# Patient Record
Sex: Female | Born: 1945
Health system: Southern US, Community
[De-identification: ages and names within clinical notes are randomized; demographics above are authoritative.]

## PROBLEM LIST (undated history)

## (undated) DIAGNOSIS — IMO0001 Reserved for inherently not codable concepts without codable children: Secondary | ICD-10-CM

## (undated) DIAGNOSIS — M069 Rheumatoid arthritis, unspecified: Secondary | ICD-10-CM

## (undated) DIAGNOSIS — K5900 Constipation, unspecified: Secondary | ICD-10-CM

## (undated) DIAGNOSIS — Z464 Encounter for fitting and adjustment of orthodontic device: Secondary | ICD-10-CM

## (undated) DIAGNOSIS — E559 Vitamin D deficiency, unspecified: Secondary | ICD-10-CM

## (undated) DIAGNOSIS — E119 Type 2 diabetes mellitus without complications: Secondary | ICD-10-CM

## (undated) DIAGNOSIS — R7303 Prediabetes: Secondary | ICD-10-CM

## (undated) DIAGNOSIS — N809 Endometriosis, unspecified: Secondary | ICD-10-CM

## (undated) DIAGNOSIS — R0602 Shortness of breath: Secondary | ICD-10-CM

## (undated) DIAGNOSIS — R29898 Other symptoms and signs involving the musculoskeletal system: Secondary | ICD-10-CM

## (undated) DIAGNOSIS — R609 Edema, unspecified: Secondary | ICD-10-CM

## (undated) DIAGNOSIS — K259 Gastric ulcer, unspecified as acute or chronic, without hemorrhage or perforation: Secondary | ICD-10-CM

## (undated) DIAGNOSIS — M1990 Unspecified osteoarthritis, unspecified site: Secondary | ICD-10-CM

## (undated) DIAGNOSIS — K760 Fatty (change of) liver, not elsewhere classified: Secondary | ICD-10-CM

## (undated) DIAGNOSIS — M2550 Pain in unspecified joint: Secondary | ICD-10-CM

## (undated) DIAGNOSIS — T8859XA Other complications of anesthesia, initial encounter: Secondary | ICD-10-CM

## (undated) DIAGNOSIS — K829 Disease of gallbladder, unspecified: Secondary | ICD-10-CM

## (undated) DIAGNOSIS — E538 Deficiency of other specified B group vitamins: Secondary | ICD-10-CM

## (undated) DIAGNOSIS — N200 Calculus of kidney: Secondary | ICD-10-CM

## (undated) DIAGNOSIS — K219 Gastro-esophageal reflux disease without esophagitis: Secondary | ICD-10-CM

## (undated) DIAGNOSIS — E7800 Pure hypercholesterolemia, unspecified: Secondary | ICD-10-CM

## (undated) DIAGNOSIS — M549 Dorsalgia, unspecified: Secondary | ICD-10-CM

## (undated) DIAGNOSIS — M25559 Pain in unspecified hip: Secondary | ICD-10-CM

## (undated) DIAGNOSIS — T4145XA Adverse effect of unspecified anesthetic, initial encounter: Secondary | ICD-10-CM

## (undated) DIAGNOSIS — R011 Cardiac murmur, unspecified: Secondary | ICD-10-CM

## (undated) HISTORY — DX: Pain in unspecified joint: M25.50

## (undated) HISTORY — DX: Prediabetes: R73.03

## (undated) HISTORY — DX: Dorsalgia, unspecified: M54.9

## (undated) HISTORY — DX: Calculus of kidney: N20.0

## (undated) HISTORY — DX: Gastro-esophageal reflux disease without esophagitis: K21.9

## (undated) HISTORY — DX: Rheumatoid arthritis, unspecified: M06.9

## (undated) HISTORY — DX: Deficiency of other specified B group vitamins: E53.8

## (undated) HISTORY — DX: Unspecified osteoarthritis, unspecified site: M19.90

## (undated) HISTORY — PX: ANKLE SURGERY: SHX546

## (undated) HISTORY — DX: Gastric ulcer, unspecified as acute or chronic, without hemorrhage or perforation: K25.9

## (undated) HISTORY — DX: Disease of gallbladder, unspecified: K82.9

## (undated) HISTORY — DX: Constipation, unspecified: K59.00

## (undated) HISTORY — PX: ABDOMINAL HYSTERECTOMY: SHX81

## (undated) HISTORY — DX: Pain in unspecified hip: M25.559

## (undated) HISTORY — DX: Other symptoms and signs involving the musculoskeletal system: R29.898

## (undated) HISTORY — DX: Pure hypercholesterolemia, unspecified: E78.00

## (undated) HISTORY — DX: Shortness of breath: R06.02

## (undated) HISTORY — DX: Endometriosis, unspecified: N80.9

## (undated) HISTORY — DX: Edema, unspecified: R60.9

## (undated) HISTORY — DX: Vitamin D deficiency, unspecified: E55.9

## (undated) HISTORY — DX: Fatty (change of) liver, not elsewhere classified: K76.0

---

## 2011-12-26 ENCOUNTER — Ambulatory Visit (INDEPENDENT_AMBULATORY_CARE_PROVIDER_SITE_OTHER): Payer: Medicare Other | Admitting: Internal Medicine

## 2011-12-26 ENCOUNTER — Encounter: Payer: Self-pay | Admitting: Internal Medicine

## 2011-12-26 VITALS — BP 110/78 | HR 79 | Temp 97.6°F | Resp 16 | Ht 61.5 in | Wt 229.2 lb

## 2011-12-26 DIAGNOSIS — Z0000 Encounter for general adult medical examination without abnormal findings: Secondary | ICD-10-CM

## 2011-12-26 DIAGNOSIS — H6092 Unspecified otitis externa, left ear: Secondary | ICD-10-CM

## 2011-12-26 DIAGNOSIS — Z1211 Encounter for screening for malignant neoplasm of colon: Secondary | ICD-10-CM

## 2011-12-26 DIAGNOSIS — Z862 Personal history of diseases of the blood and blood-forming organs and certain disorders involving the immune mechanism: Secondary | ICD-10-CM

## 2011-12-26 DIAGNOSIS — Z1331 Encounter for screening for depression: Secondary | ICD-10-CM

## 2011-12-26 DIAGNOSIS — L989 Disorder of the skin and subcutaneous tissue, unspecified: Secondary | ICD-10-CM

## 2011-12-26 DIAGNOSIS — M79604 Pain in right leg: Secondary | ICD-10-CM | POA: Insufficient documentation

## 2011-12-26 DIAGNOSIS — R7309 Other abnormal glucose: Secondary | ICD-10-CM

## 2011-12-26 DIAGNOSIS — Z8639 Personal history of other endocrine, nutritional and metabolic disease: Secondary | ICD-10-CM

## 2011-12-26 DIAGNOSIS — R5383 Other fatigue: Secondary | ICD-10-CM

## 2011-12-26 DIAGNOSIS — Z1239 Encounter for other screening for malignant neoplasm of breast: Secondary | ICD-10-CM

## 2011-12-26 DIAGNOSIS — H60399 Other infective otitis externa, unspecified ear: Secondary | ICD-10-CM

## 2011-12-26 DIAGNOSIS — E7800 Pure hypercholesterolemia, unspecified: Secondary | ICD-10-CM

## 2011-12-26 DIAGNOSIS — M79609 Pain in unspecified limb: Secondary | ICD-10-CM

## 2011-12-26 DIAGNOSIS — Z23 Encounter for immunization: Secondary | ICD-10-CM

## 2011-12-26 DIAGNOSIS — R739 Hyperglycemia, unspecified: Secondary | ICD-10-CM

## 2011-12-26 LAB — CBC WITH DIFFERENTIAL/PLATELET
Basophils Relative: 0.2 % (ref 0.0–3.0)
Hemoglobin: 14.4 g/dL (ref 12.0–15.0)
Lymphocytes Relative: 29.6 % (ref 12.0–46.0)
Monocytes Relative: 7 % (ref 3.0–12.0)
Neutro Abs: 5.2 10*3/uL (ref 1.4–7.7)
Neutrophils Relative %: 61.1 % (ref 43.0–77.0)
RBC: 4.95 Mil/uL (ref 3.87–5.11)
WBC: 8.4 10*3/uL (ref 4.5–10.5)

## 2011-12-26 LAB — COMPREHENSIVE METABOLIC PANEL
BUN: 10 mg/dL (ref 6–23)
CO2: 26 mEq/L (ref 19–32)
Calcium: 9.2 mg/dL (ref 8.4–10.5)
Creatinine, Ser: 0.7 mg/dL (ref 0.4–1.2)
GFR: 95.37 mL/min (ref >60.00)
Glucose, Bld: 98 mg/dL (ref 70–99)
Total Bilirubin: 0.6 mg/dL (ref 0.3–1.2)

## 2011-12-26 LAB — TSH: TSH: 1.72 u[IU]/mL (ref 0.35–5.50)

## 2011-12-26 LAB — HEMOGLOBIN A1C: Hgb A1c MFr Bld: 6 % (ref 4.6–6.5)

## 2011-12-26 LAB — LIPID PANEL
Cholesterol: 242 mg/dL — ABNORMAL HIGH (ref 0–200)
HDL: 39.7 mg/dL (ref >39.00)
Total CHOL/HDL Ratio: 6
Triglycerides: 139 mg/dL (ref 0.0–149.0)

## 2011-12-26 MED ORDER — PNEUMOCOCCAL VAC POLYVALENT 25 MCG/0.5ML IJ INJ: 0.5000 mL | INJECTION | Freq: Once | INTRAMUSCULAR | Status: DC

## 2011-12-26 MED ORDER — ZOSTER VACCINE LIVE 19400 UNT/0.65ML ~~LOC~~ SOLR: 0.6500 mL | Freq: Once | SUBCUTANEOUS | Status: DC

## 2011-12-26 MED ORDER — CIPROFLOXACIN-DEXAMETHASONE 0.3-0.1 % OT SUSP: 4.0000 [drp] | Freq: Two times a day (BID) | OTIC | Status: DC

## 2011-12-26 NOTE — Assessment & Plan Note (Signed)
Pt with several week h/o pain right posterior thigh. Exam is normal today. No edema, skin changes, palpable abnormalities. Suspect muscular strain leading to pain symptoms. If symptoms are persistent, would favor getting US of the area for evaluation.

## 2011-12-26 NOTE — Assessment & Plan Note (Signed)
Skin lesion left axilla most consistent with seborrheic keratosis. Offered reassurance today. Discussed that SK may be removed with cryotherapy, but this would be for cosmetic reasons only.

## 2011-12-26 NOTE — Assessment & Plan Note (Signed)
Symptoms and exam are most consistent with otitis external of left ear. Will treat with Ciprodex. Pt will call if symptoms not improving.

## 2011-12-26 NOTE — Progress Notes (Signed)
Subjective:    Patient ID: Morgan Hurst, female    DOB: December 06, 1945, 66 y.o.   MRN: [id number]  HPI 66 year old female presents to establish care. She has not been seen in over 2 years. She has several concerns today. First, she reports significant fatigue over the last several months. She is having difficulty functioning in completing normal activities of daily living. She reports she has not frequently during the day. She reports that at times it feels like "an effort to breathe "however she denies any focal symptoms such as chest pain, shortness of breath, change in appetite or bowel habits. She has been diagnosed with both B12 deficiency and vitamin D deficiency in the past and questions whether this may be contributing. She has not been taking any supplemental medications.  She is also concerned today about a brown skin lesion in her left axilla. She is unsure how long this has been present. It is not painful or itchy. She has some similar lesions over her anterior chest.  She is also concerned about a several week history of pain in her right posterior thigh. She denies any trauma to her leg, overlying skin changes, swelling in her leg. She reports an area of nodularity in her right posterior thigh which is tender to palpation. This does not seem to be changing in size. She has not taken any medication for this.  She also notes a history of borderline diabetes. She has not been checked for this in some time. She was trying to follow a healthier diet and get regular physical activity, and reports this led to improvement in her blood sugars in the past. She has never been on medication to control her blood sugar.  Outpatient Encounter Prescriptions as of 12/26/2011  Medication Sig Dispense Refill  . ciprofloxacin-dexamethasone (CIPRODEX) otic suspension Place 4 drops into the left ear 2 (two) times daily.  7.5 mL  0  . zoster vaccine live, PF, (ZOSTAVAX) 35573 UNT/0.65ML injection Inject 19,400  Units into the skin once.  1 each  0   Facility-Administered Encounter Medications as of 12/26/2011  Medication Dose Route Frequency Provider Last Rate Last Dose  . pneumococcal 23 valent vaccine (PNU-IMMUNE) injection 0.5 mL  0.5 mL Intramuscular Once Jackolyn Confer, MD       BP 110/78  Pulse 79  Temp 97.6 F (36.4 C) (Oral)  Resp 16  Ht 5' 1.5" (1.562 m)  Wt 229 lb 4 oz (103.987 kg)  BMI 42.61 kg/m2  SpO2 96%  Review of Systems  Constitutional: Positive for fatigue. Negative for fever, chills, appetite change and unexpected weight change.  HENT: Negative for ear pain, congestion, sore throat, trouble swallowing, neck pain, voice change and sinus pressure.   Eyes: Negative for visual disturbance.  Respiratory: Negative for cough, shortness of breath, wheezing and stridor.   Cardiovascular: Negative for chest pain, palpitations and leg swelling.  Gastrointestinal: Negative for nausea, vomiting, abdominal pain, diarrhea, constipation, blood in stool, abdominal distention and anal bleeding.  Genitourinary: Negative for dysuria and flank pain.  Musculoskeletal: Positive for myalgias. Negative for arthralgias and gait problem.  Skin: Negative for color change and rash.  Neurological: Negative for dizziness and headaches.  Hematological: Negative for adenopathy. Does not bruise/bleed easily.  Psychiatric/Behavioral: Negative for suicidal ideas, sleep disturbance and dysphoric mood. The patient is not nervous/anxious.        Objective:   Physical Exam  Constitutional: She is oriented to person, place, and time. She appears well-developed and well-nourished. No  distress.  HENT:  Head: Normocephalic and atraumatic.  Right Ear: External ear normal.  Left Ear: External ear normal.  Nose: Nose normal.  Mouth/Throat: Oropharynx is clear and moist. No oropharyngeal exudate.  Eyes: Conjunctivae normal are normal. Pupils are equal, round, and reactive to light. Right eye exhibits no  discharge. Left eye exhibits no discharge. No scleral icterus.  Neck: Normal range of motion. Neck supple. No tracheal deviation present. No thyromegaly present.  Cardiovascular: Normal rate, regular rhythm, normal heart sounds and intact distal pulses.  Exam reveals no gallop and no friction rub.   No murmur heard. Pulmonary/Chest: Effort normal and breath sounds normal. No respiratory distress. She has no wheezes. She has no rales. She exhibits no tenderness.    Musculoskeletal: Normal range of motion. She exhibits no edema and no tenderness.       Right upper leg: She exhibits tenderness. She exhibits no bony tenderness, no swelling, no edema and no deformity.       Legs: Lymphadenopathy:    She has no cervical adenopathy.  Neurological: She is alert and oriented to person, place, and time. No cranial nerve deficit. She exhibits normal muscle tone. Coordination normal.  Skin: Skin is warm and dry. No rash noted. She is not diaphoretic. No erythema. No pallor.  Psychiatric: She has a normal mood and affect. Her behavior is normal. Judgment and thought content normal.          Assessment & Plan:

## 2011-12-26 NOTE — Assessment & Plan Note (Signed)
Symptoms of profound fatigue. No focal symptoms. Will check CBC, B12, TSH, Vit D, CMP with labs. Pt has h/o B12 def which is currently untreated, so highly suspicious this is causing symptoms. We also discussed potential of sleep apnea. If labs are normal, then would favor getting sleep study. Follow up 4 weeks.

## 2011-12-26 NOTE — Assessment & Plan Note (Signed)
Will set up colonoscopy. Pt has never had a colonoscopy and has history of colon cancer in mother and brother.

## 2011-12-26 NOTE — Assessment & Plan Note (Signed)
Pt with h/o "borderline diabetes" in the past. Will check A1c with labs today.

## 2011-12-26 NOTE — Assessment & Plan Note (Signed)
Will set up screening mammogram.

## 2011-12-27 LAB — VITAMIN D 25 HYDROXY (VIT D DEFICIENCY, FRACTURES): Vit D, 25-Hydroxy: 31 ng/mL (ref 30–89)

## 2011-12-27 MED ORDER — ATORVASTATIN CALCIUM 20 MG PO TABS: 20.0000 mg | ORAL_TABLET | Freq: Every day | ORAL | Status: DC

## 2011-12-27 NOTE — Addendum Note (Signed)
Addended by: Kris Hartmann on: 12/27/2011 05:10 PM   Modules accepted: Orders

## 2011-12-29 ENCOUNTER — Ambulatory Visit: Payer: Medicare Other

## 2011-12-29 ENCOUNTER — Telehealth: Payer: Self-pay | Admitting: Internal Medicine

## 2011-12-29 NOTE — Telephone Encounter (Signed)
Please call pt when b12 shot comes in

## 2012-01-04 NOTE — Telephone Encounter (Signed)
Schedule for B12

## 2012-01-17 ENCOUNTER — Ambulatory Visit (INDEPENDENT_AMBULATORY_CARE_PROVIDER_SITE_OTHER): Payer: Medicare Other | Admitting: Internal Medicine

## 2012-01-17 DIAGNOSIS — R5383 Other fatigue: Secondary | ICD-10-CM

## 2012-01-17 DIAGNOSIS — R5381 Other malaise: Secondary | ICD-10-CM

## 2012-01-17 MED ORDER — CYANOCOBALAMIN 1000 MCG/ML IJ SOLN
1000.0000 ug | Freq: Once | INTRAMUSCULAR | Status: AC
  Administered 2012-01-17: 1000 ug via INTRAMUSCULAR

## 2012-01-17 NOTE — Progress Notes (Signed)
  Subjective:    Patient ID: Morgan Hurst, female    DOB: 1945-07-05, 66 y.o.   MRN: [id number]  HPI Nurse only    Review of Systems     Objective:   Physical Exam        Assessment & Plan:

## 2012-01-24 ENCOUNTER — Ambulatory Visit: Payer: Medicare Other | Admitting: Internal Medicine

## 2012-01-25 ENCOUNTER — Ambulatory Visit (INDEPENDENT_AMBULATORY_CARE_PROVIDER_SITE_OTHER): Payer: Medicare Other | Admitting: Internal Medicine

## 2012-01-25 ENCOUNTER — Encounter: Payer: Self-pay | Admitting: Internal Medicine

## 2012-01-25 VITALS — BP 120/80 | HR 86 | Temp 98.3°F | Ht 61.5 in | Wt 235.5 lb

## 2012-01-25 DIAGNOSIS — E538 Deficiency of other specified B group vitamins: Secondary | ICD-10-CM

## 2012-01-25 DIAGNOSIS — S20219A Contusion of unspecified front wall of thorax, initial encounter: Secondary | ICD-10-CM | POA: Insufficient documentation

## 2012-01-25 DIAGNOSIS — E785 Hyperlipidemia, unspecified: Secondary | ICD-10-CM

## 2012-01-25 MED ORDER — TRAMADOL HCL 50 MG PO TABS: 50.0000 mg | ORAL_TABLET | Freq: Three times a day (TID) | ORAL | Status: DC | PRN

## 2012-01-25 MED ORDER — CYANOCOBALAMIN 1000 MCG/ML IJ SOLN
1000.0000 ug | Freq: Once | INTRAMUSCULAR | Status: AC
  Administered 2012-01-25: 1000 ug via INTRAMUSCULAR
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
## 2012-01-25 NOTE — Assessment & Plan Note (Signed)
LDL elevated 181. Pt is concerned about risk of diabetes associated with statin meds. Offerred reassurance today. Pt would prefer to stop lipitor and try using diet and exercise to improve lipids. Recommended Mediterranean style diet, low in sat fat and high in fiber, and setting goal of 12mn vigorous exercise 3 times per week. Follow up with repeat lipids and LFTs in 3 months.

## 2012-01-25 NOTE — Addendum Note (Signed)
Addended by: Harmon Dun on: 01/25/2012 02:18 PM   Modules accepted: Orders
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
## 2012-01-26 LAB — HM COLONOSCOPY

## 2012-01-27 ENCOUNTER — Telehealth: Payer: Self-pay | Admitting: Internal Medicine

## 2012-01-27 ENCOUNTER — Other Ambulatory Visit: Payer: Self-pay | Admitting: Internal Medicine

## 2012-01-27 DIAGNOSIS — S20219A Contusion of unspecified front wall of thorax, initial encounter: Secondary | ICD-10-CM

## 2012-01-27 NOTE — Telephone Encounter (Signed)
Called patient at home number and left message with spouse to have her return call.
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
## 2012-01-30 NOTE — Telephone Encounter (Signed)
Patient advised as instructed via telephone.

## 2012-02-01 ENCOUNTER — Encounter: Payer: Self-pay | Admitting: Internal Medicine

## 2012-02-01 ENCOUNTER — Ambulatory Visit (INDEPENDENT_AMBULATORY_CARE_PROVIDER_SITE_OTHER): Payer: Medicare Other | Admitting: *Deleted

## 2012-02-01 DIAGNOSIS — E538 Deficiency of other specified B group vitamins: Secondary | ICD-10-CM

## 2012-02-01 MED ORDER — CYANOCOBALAMIN 1000 MCG/ML IJ SOLN
1000.0000 ug | Freq: Once | INTRAMUSCULAR | Status: AC
  Administered 2012-02-01: 1000 ug via INTRAMUSCULAR

## 2012-02-08 ENCOUNTER — Encounter: Payer: Self-pay | Admitting: Internal Medicine

## 2012-02-27 ENCOUNTER — Ambulatory Visit: Payer: Self-pay | Admitting: Internal Medicine

## 2012-04-25 ENCOUNTER — Other Ambulatory Visit: Payer: Medicare Other

## 2012-05-01 ENCOUNTER — Ambulatory Visit: Payer: Medicare Other | Admitting: Internal Medicine

## 2012-05-02 ENCOUNTER — Ambulatory Visit: Payer: Self-pay | Admitting: Gastroenterology

## 2012-05-24 ENCOUNTER — Encounter: Payer: Self-pay | Admitting: Internal Medicine

## 2012-08-16 ENCOUNTER — Encounter: Payer: Self-pay | Admitting: Internal Medicine

## 2012-12-25 ENCOUNTER — Encounter (INDEPENDENT_AMBULATORY_CARE_PROVIDER_SITE_OTHER): Payer: Self-pay

## 2012-12-25 ENCOUNTER — Encounter: Payer: Self-pay | Admitting: Internal Medicine

## 2012-12-25 ENCOUNTER — Ambulatory Visit (INDEPENDENT_AMBULATORY_CARE_PROVIDER_SITE_OTHER): Payer: Medicare Other | Admitting: Internal Medicine

## 2012-12-25 VITALS — BP 140/80 | HR 82 | Temp 97.8°F | Ht 61.0 in | Wt 229.0 lb

## 2012-12-25 DIAGNOSIS — Z1239 Encounter for other screening for malignant neoplasm of breast: Secondary | ICD-10-CM

## 2012-12-25 DIAGNOSIS — D510 Vitamin B12 deficiency anemia due to intrinsic factor deficiency: Secondary | ICD-10-CM

## 2012-12-25 DIAGNOSIS — H60399 Other infective otitis externa, unspecified ear: Secondary | ICD-10-CM

## 2012-12-25 DIAGNOSIS — Z23 Encounter for immunization: Secondary | ICD-10-CM

## 2012-12-25 DIAGNOSIS — E785 Hyperlipidemia, unspecified: Secondary | ICD-10-CM

## 2012-12-25 DIAGNOSIS — M2550 Pain in unspecified joint: Secondary | ICD-10-CM

## 2012-12-25 DIAGNOSIS — H6093 Unspecified otitis externa, bilateral: Secondary | ICD-10-CM

## 2012-12-25 DIAGNOSIS — R413 Other amnesia: Secondary | ICD-10-CM

## 2012-12-25 DIAGNOSIS — Z0000 Encounter for general adult medical examination without abnormal findings: Secondary | ICD-10-CM

## 2012-12-25 MED ORDER — CELECOXIB 200 MG PO CAPS: 200.0000 mg | ORAL_CAPSULE | Freq: Two times a day (BID) | ORAL | Status: DC

## 2012-12-25 MED ORDER — CIPROFLOXACIN-DEXAMETHASONE 0.3-0.1 % OT SUSP: 4.0000 [drp] | Freq: Two times a day (BID) | OTIC | Status: DC

## 2012-12-25 MED ORDER — TRETINOIN 0.025 % EX CREA: TOPICAL_CREAM | Freq: Every day | CUTANEOUS | Status: DC

## 2012-12-25 NOTE — Assessment & Plan Note (Addendum)
Diffuse arthralgia noted by pt. Symptoms most consistent with osteoarthritis. Will start Celebrex to see if any improvement in symptoms. Follow up 4 weeks and prn.

## 2012-12-25 NOTE — Assessment & Plan Note (Signed)
Previous h/o pernicious anemia, now with fatigue. Will check CBC and B12 level with labs today.

## 2012-12-25 NOTE — Assessment & Plan Note (Signed)
Breast exam normal today. Mammogram ordered.

## 2012-12-25 NOTE — Progress Notes (Signed)
Subjective:    Patient ID: Morgan Hurst, female    DOB: [birth year], 67 y.o.   MRN: [id number]  HPI The patient is here for annual Medicare wellness examination and management of other chronic and acute problems.   The risk factors are reflected in the social history.  The roster of all physicians providing medical care to patient - is listed in the Snapshot section of the chart.  Activities of daily living:  The patient is 100% independent in all ADLs: dressing, toileting, feeding as well as independent mobility. Lives with husband.  Home safety : The patient has smoke detectors in the home. They wear seatbelts.  There are no firearms at home. There is no violence in the home.   There is no risks for hepatitis, STDs or HIV. There is no history of blood transfusion. They have no travel history to infectious disease endemic areas of the world.  The patient has seen their dentist in the last six month.  Dentist - unsure name They have seen their eye doctor in the last year. Opthalmologist - [place]  They have deferred audiologic testing in the last year.   They do not  have excessive sun exposure. Discussed the need for sun protection: hats, long sleeves and use of sunscreen if there is significant sun exposure. Dermatologist - none  Diet: the importance of a healthy diet is discussed. They do have a relatively healthy diet.  The benefits of regular aerobic exercise were discussed. She walks on treadmill at home on occasion, however limited by arthritis. Takes Aleve to help with this.  Depression screen: there are no signs or vegative symptoms of depression- irritability, change in appetite, anhedonia, sadness/tearfullness.  Cognitive assessment: the patient manages all their financial and personal affairs and is actively engaged. They could relate day,date,year and events. Notes some slowness of memory recall over last few months.  The following portions of the patient's history  were reviewed and updated as appropriate: allergies, current medications, past family history, past medical history,  past surgical history, past social history  and problem list.  Visual acuity was not assessed per patient preference since she has regular follow up with her ophthalmologist. Hearing and body mass index were assessed and reviewed.   During the course of the visit the patient was educated and counseled about appropriate screening and preventive services including : fall prevention , diabetes screening, nutrition counseling, colorectal cancer screening, and recommended immunizations.    She is concerned today about several months of progressively worsening joint pain. This is most prominent in knees and low back. Symptoms are made worse by physical activity. They are improved with rest and use of Naprosyn. However, she has been taking up to 6 Naprosyn daily. No joint swelling, weakness noted.  She is also concerned about bilateral ear pain over last few weeks. She denies fever, chills, congestion. Pain located in ear canal, occasionally associated with itching. In past, used Ciprodex with improvement.  Outpatient Encounter Prescriptions as of 12/25/2012  Medication Sig  . Apoaequorin (PREVAGEN PO) Take by mouth.  Marland Kitchen aspirin 81 MG tablet Take 81 mg by mouth daily.  . Cholecalciferol (VITAMIN D-3 PO) Take by mouth.  . traMADol (ULTRAM) 50 MG tablet Take 1 tablet (50 mg total) by mouth every 8 (eight) hours as needed for pain.  . celecoxib (CELEBREX) 200 MG capsule Take 1 capsule (200 mg total) by mouth 2 (two) times daily.   BP 140/80  Pulse 82  Temp(Src)  97.8 F (36.6 C) (Oral)  Ht 5' 1"  (1.549 m)  Wt 229 lb (103.874 kg)  BMI 43.29 kg/m2  SpO2 94%   Review of Systems  Constitutional: Negative for fever, chills, appetite change, fatigue and unexpected weight change.  HENT: Positive for ear pain. Negative for congestion, sinus pressure, sore throat, trouble swallowing and voice  change.   Eyes: Negative for visual disturbance.  Respiratory: Negative for cough, shortness of breath, wheezing and stridor.   Cardiovascular: Negative for chest pain, palpitations and leg swelling.  Gastrointestinal: Negative for nausea, vomiting, abdominal pain, diarrhea, constipation, blood in stool, abdominal distention and anal bleeding.  Genitourinary: Negative for dysuria and flank pain.  Musculoskeletal: Positive for arthralgias and back pain. Negative for gait problem, myalgias and neck pain.  Skin: Negative for color change and rash.  Neurological: Negative for dizziness and headaches.  Hematological: Negative for adenopathy. Does not bruise/bleed easily.  Psychiatric/Behavioral: Positive for confusion and decreased concentration. Negative for suicidal ideas, sleep disturbance and dysphoric mood. The patient is not nervous/anxious.        Objective:   Physical Exam  Constitutional: She is oriented to person, place, and time. She appears well-developed and well-nourished. No distress.  HENT:  Head: Normocephalic and atraumatic.  Right Ear: External ear normal.  Left Ear: External ear normal.  Nose: Nose normal.  Mouth/Throat: Oropharynx is clear and moist. No oropharyngeal exudate.  Bilateral ear canals with erythema, no exudate.  Eyes: Conjunctivae are normal. Pupils are equal, round, and reactive to light. Right eye exhibits no discharge. Left eye exhibits no discharge. No scleral icterus.  Neck: Normal range of motion. Neck supple. No tracheal deviation present. No thyromegaly present.  Cardiovascular: Normal rate, regular rhythm, normal heart sounds and intact distal pulses.  Exam reveals no gallop and no friction rub.   No murmur heard. Pulmonary/Chest: Effort normal and breath sounds normal. No accessory muscle usage. Not tachypneic. No respiratory distress. She has no decreased breath sounds. She has no wheezes. She has no rales. She exhibits no tenderness. Right breast  exhibits no inverted nipple, no mass, no nipple discharge, no skin change and no tenderness. Left breast exhibits no inverted nipple, no mass, no nipple discharge, no skin change and no tenderness. Breasts are symmetrical.  Abdominal: Soft. Bowel sounds are normal. She exhibits no distension and no mass. There is no tenderness. There is no rebound and no guarding.  Musculoskeletal: She exhibits no edema and no tenderness.       Lumbar back: She exhibits decreased range of motion and pain. She exhibits no tenderness.  Lymphadenopathy:    She has no cervical adenopathy.  Neurological: She is alert and oriented to person, place, and time. No cranial nerve deficit. She exhibits normal muscle tone. Coordination normal.  Skin: Skin is warm and dry. No rash noted. She is not diaphoretic. No erythema. No pallor.  Psychiatric: She has a normal mood and affect. Her behavior is normal. Judgment and thought content normal.          Assessment & Plan:

## 2012-12-25 NOTE — Progress Notes (Signed)
Pre-visit discussion using our clinic review tool. No additional management support is needed unless otherwise documented below in the visit note.  

## 2012-12-25 NOTE — Assessment & Plan Note (Signed)
Recent symptoms of short term memory loss and problems with word finding. Suspect B12 deficiency contributing. Will check level today. If labs are normal, we discussed cognitive testing for further evaluation.

## 2012-12-25 NOTE — Assessment & Plan Note (Addendum)
General medical exam including breast exam normal today. PAP and pelvic deferred as pt s/p hysterectomy. Mammogram ordered. Flu vaccine given today. Encouraged healthy diet and regular physical exercise. Appropriate screening performed.

## 2012-12-25 NOTE — Assessment & Plan Note (Signed)
Persistent symptoms of pain bilateral ear canals with erythema noted on exam. Will treat with Ciprodex. If no improvement, discussed referral to ENT for further evaluation.

## 2012-12-26 LAB — COMPREHENSIVE METABOLIC PANEL
AST: 38 U/L — ABNORMAL HIGH (ref 0–37)
Alkaline Phosphatase: 80 U/L (ref 39–117)
BUN: 18 mg/dL (ref 6–23)
Calcium: 9.4 mg/dL (ref 8.4–10.5)
Creatinine, Ser: 0.7 mg/dL (ref 0.4–1.2)
Sodium: 144 mEq/L (ref 135–145)
Total Bilirubin: 0.7 mg/dL (ref 0.3–1.2)

## 2012-12-26 LAB — CBC WITH DIFFERENTIAL/PLATELET
Basophils Absolute: 0 10*3/uL (ref 0.0–0.1)
Basophils Relative: 0.4 % (ref 0.0–3.0)
Eosinophils Absolute: 0.3 10*3/uL (ref 0.0–0.7)
Eosinophils Relative: 2.7 % (ref 0.0–5.0)
HCT: 42.8 % (ref 36.0–46.0)
Hemoglobin: 14.1 g/dL (ref 12.0–15.0)
Lymphocytes Relative: 29.6 % (ref 12.0–46.0)
Lymphs Abs: 2.9 10*3/uL (ref 0.7–4.0)
MCHC: 33 g/dL (ref 30.0–36.0)
MCV: 87.7 fl (ref 78.0–100.0)
Monocytes Absolute: 0.7 10*3/uL (ref 0.1–1.0)
Neutro Abs: 5.9 10*3/uL (ref 1.4–7.7)
RBC: 4.88 Mil/uL (ref 3.87–5.11)
RDW: 14.2 % (ref 11.5–14.6)
WBC: 9.8 10*3/uL (ref 4.5–10.5)

## 2012-12-26 LAB — LIPID PANEL
Cholesterol: 229 mg/dL — ABNORMAL HIGH (ref 0–200)
Total CHOL/HDL Ratio: 5
Triglycerides: 83 mg/dL (ref 0.0–149.0)
VLDL: 16.6 mg/dL (ref 0.0–40.0)

## 2012-12-26 LAB — VITAMIN B12: Vitamin B-12: 322 pg/mL (ref 211–911)

## 2012-12-26 LAB — LDL CHOLESTEROL, DIRECT: Direct LDL: 177.7 mg/dL

## 2013-01-25 ENCOUNTER — Other Ambulatory Visit: Payer: Medicare Other

## 2013-07-25 ENCOUNTER — Ambulatory Visit (INDEPENDENT_AMBULATORY_CARE_PROVIDER_SITE_OTHER): Payer: Medicare (Managed Care) | Admitting: Adult Health

## 2013-07-25 ENCOUNTER — Encounter: Payer: Self-pay | Admitting: Adult Health

## 2013-07-25 ENCOUNTER — Telehealth: Payer: Self-pay | Admitting: Internal Medicine

## 2013-07-25 VITALS — BP 135/82 | HR 73 | Temp 97.5°F | Resp 16 | Wt 228.5 lb

## 2013-07-25 DIAGNOSIS — M5430 Sciatica, unspecified side: Secondary | ICD-10-CM | POA: Diagnosis not present

## 2013-07-25 DIAGNOSIS — M5441 Lumbago with sciatica, right side: Secondary | ICD-10-CM | POA: Insufficient documentation

## 2013-07-25 MED ORDER — METHOCARBAMOL 500 MG PO TABS: 500.0000 mg | ORAL_TABLET | Freq: Three times a day (TID) | ORAL | Status: DC

## 2013-07-25 MED ORDER — TRAMADOL HCL 50 MG PO TABS: 50.0000 mg | ORAL_TABLET | Freq: Three times a day (TID) | ORAL | Status: DC | PRN

## 2013-07-25 MED ORDER — PREDNISONE 10 MG PO TABS: ORAL_TABLET | ORAL | Status: DC

## 2013-07-25 NOTE — Telephone Encounter (Signed)
Please see Dr Derry Skill note about appt

## 2013-07-25 NOTE — Progress Notes (Signed)
Pre visit review using our clinic review tool, if applicable. No additional management support is needed unless otherwise documented below in the visit note. 

## 2013-07-25 NOTE — Telephone Encounter (Signed)
The patient pulled a muscle in her back trying to get her husband out of the floor from a fall. She is wanting to see Dr. Gilford Rile.

## 2013-07-25 NOTE — Patient Instructions (Signed)
   Take Tramadol for pain. You can take one table every 8 hours as needed.  Robaxin is a muscle relaxer. You can take this 3 times a day for muscle spasms or tightness.  Start prednisone taper with food as follows:  Day #1 - take 6 tablets Day #2 - take 5 tablets Day #3 - take 4 tablets Day #4 - take 3 tablets Day #5 - take 2 tablets Day #6 - take 1 tablet  Do not take the celebrex while taking prednisone.  Apply ice alternating with heat to the affected areas for 20 min at a time. Do this approximately 3-4 times daily.  Use a firm pillow between your knees when you lie on your side or under your knees when you lie on your back.  Return to clinic if your symptoms are not improving within 2 weeks or sooner if your symptoms worsen.

## 2013-07-25 NOTE — Telephone Encounter (Signed)
The schedule is packed today and tomorrow. I would recommend that we schedule her with Raquel today.

## 2013-07-25 NOTE — Progress Notes (Signed)
Patient ID: Morgan Hurst, female   DOB: 1945/11/11, 68 y.o.   MRN: [id number]   Subjective:    Patient ID: Morgan Hurst, female    DOB: Jan 10, 1946, 68 y.o.   MRN: [id number]  HPI  Pt is a pleasant 68 y/o female who present to clinic with pain of her lower back after helping her husband off the floor following a fall. She takes celebrex and has also taken aleve. She was taking approximately 2 aleve at bedtime. She has also used ben gay to see if it helps. Pain in the right lower back and radiates to her leg. Having trouble getting from a sitting to a standing position.   Past Medical History  Diagnosis Date  . Arthritis   . Endometriosis     Current Outpatient Prescriptions on File Prior to Visit  Medication Sig Dispense Refill  . Apoaequorin (PREVAGEN PO) Take by mouth.      Marland Kitchen aspirin 81 MG tablet Take 81 mg by mouth daily.      . celecoxib (CELEBREX) 200 MG capsule Take 1 capsule (200 mg total) by mouth 2 (two) times daily.  60 capsule  3  . Cholecalciferol (VITAMIN D-3 PO) Take by mouth.      . ciprofloxacin-dexamethasone (CIPRODEX) otic suspension Place 4 drops into both ears 2 (two) times daily.  7.5 mL  0  . Cyanocobalamin 1000 MCG/ML LIQD One injection (1,000 mg) once a week for three weeks then once a month      . traMADol (ULTRAM) 50 MG tablet Take 1 tablet (50 mg total) by mouth every 8 (eight) hours as needed for pain.  60 tablet  3  . tretinoin (RETIN-A) 0.025 % cream Apply topically at bedtime.  45 g  0   No current facility-administered medications on file prior to visit.     Review of Systems  Respiratory: Negative.   Genitourinary: Negative.   Musculoskeletal: Positive for back pain and gait problem.  All other systems reviewed and are negative.      Objective:  BP 135/82  Pulse 73  Temp(Src) 97.5 F (36.4 C) (Oral)  Resp 16  Wt 228 lb 8 oz (103.647 kg)  SpO2 96%   Physical Exam  Constitutional: She is oriented to person, place, and time. No  distress.  Cardiovascular: Normal rate and regular rhythm.   Pulmonary/Chest: Effort normal. No respiratory distress.  Musculoskeletal: Normal range of motion. She exhibits tenderness.  Movements are guarded. She has trouble getting from sitting to standing and vice versa.  Neurological: She is alert and oriented to person, place, and time.  Skin: Skin is warm and dry.  Psychiatric: She has a normal mood and affect. Her behavior is normal. Judgment and thought content normal.      Assessment & Plan:   1. Right-sided low back pain with right-sided sciatica Prednisone taper - 60 mg taper by 10 mg daily until done. Hold celebrex while on taper. Also advised not to take aleve or ibuprofen with celebrex when she restarts it. Ice alternating with heat for 20 min 3-4 times a day. Tramadol for pain. Robaxin for muscle spasms. RTC if not improvement within 2 weeks or sooner if necessary.

## 2013-07-25 NOTE — Telephone Encounter (Signed)
Please advise 

## 2013-08-08 ENCOUNTER — Ambulatory Visit (INDEPENDENT_AMBULATORY_CARE_PROVIDER_SITE_OTHER): Payer: Medicare (Managed Care) | Admitting: Adult Health

## 2013-08-08 ENCOUNTER — Ambulatory Visit (INDEPENDENT_AMBULATORY_CARE_PROVIDER_SITE_OTHER)
Admission: RE | Admit: 2013-08-08 | Discharge: 2013-08-08 | Disposition: A | Discharge status: Home / Self Care | Payer: Medicare (Managed Care) | Source: Ambulatory Visit | Attending: Adult Health | Admitting: Adult Health

## 2013-08-08 ENCOUNTER — Encounter: Payer: Self-pay | Admitting: Adult Health

## 2013-08-08 VITALS — BP 122/79 | HR 69 | Temp 98.0°F | Resp 14 | Wt 228.2 lb

## 2013-08-08 DIAGNOSIS — M545 Low back pain, unspecified: Secondary | ICD-10-CM

## 2013-08-08 MED ORDER — PREDNISONE 10 MG PO TABS: ORAL_TABLET | ORAL | Status: DC

## 2013-08-08 NOTE — Progress Notes (Signed)
Patient ID: Morgan Hurst, female   DOB: 1945-08-19, 68 y.o.   MRN: [id number]   Subjective:    Patient ID: Morgan Hurst, female    DOB: 1945/03/12, 68 y.o.   MRN: [id number]  HPI  Pt with ongoing low back pain with sciatica following lifting her husband off the floor when he fell. She reports that she felt the prednisone was helping. She has also been taking Tramadol and the Robaxin. She now has radiating pain down the left leg as well. She is applying heat and ice occasionally. She has not done any lifting. No numbness or tingling.  Past Medical History  Diagnosis Date  . Arthritis   . Endometriosis     Current Outpatient Prescriptions on File Prior to Visit  Medication Sig Dispense Refill  . Apoaequorin (PREVAGEN PO) Take by mouth.      Marland Kitchen aspirin 81 MG tablet Take 81 mg by mouth daily.      . celecoxib (CELEBREX) 200 MG capsule Take 1 capsule (200 mg total) by mouth 2 (two) times daily.  60 capsule  3  . Cholecalciferol (VITAMIN D-3 PO) Take by mouth.      . Cyanocobalamin 1000 MCG/ML LIQD One injection (1,000 mg) once a week for three weeks then once a month      . methocarbamol (ROBAXIN) 500 MG tablet Take 1 tablet (500 mg total) by mouth 3 (three) times daily.  30 tablet  0  . traMADol (ULTRAM) 50 MG tablet Take 1 tablet (50 mg total) by mouth every 8 (eight) hours as needed.  60 tablet  0  . tretinoin (RETIN-A) 0.025 % cream Apply topically at bedtime.  45 g  0   No current facility-administered medications on file prior to visit.     Review of Systems Positive for low back pain with radiating pain down bilateral legs, difficulty moving from sitting to standing position Negative for numbness, tingling. Denies fever, chills, rash All other systems negative    Objective:  BP 122/79  Pulse 69  Temp(Src) 98 F (36.7 C) (Oral)  Resp 14  Wt 228 lb 4 oz (103.534 kg)  SpO2 98%   Physical Exam  Constitutional: She is oriented to person, place, and time. No distress.    Cardiovascular: Normal rate and regular rhythm.   Pulmonary/Chest: Effort normal. No respiratory distress.  Musculoskeletal: She exhibits tenderness. She exhibits no edema.  Pain in low back. Difficulty with changing positions.  Neurological: She is alert and oriented to person, place, and time.  Skin: Skin is warm and dry.  Psychiatric: She has a normal mood and affect. Her behavior is normal. Judgment and thought content normal.      Assessment & Plan:   1. Bilateral low back pain, with sciatica presence unspecified I will send her for lumbar films. Start a longer prednisone taper. Perhaps the first one was too short. She did notice improvement with the prednisone. Continue robaxin and tramadol. May need referral to ortho. Await results of xray. - DG Lumbar Spine Complete; Future

## 2013-08-08 NOTE — Progress Notes (Signed)
Pre visit review using our clinic review tool, if applicable. No additional management support is needed unless otherwise documented below in the visit note. 

## 2013-08-13 ENCOUNTER — Other Ambulatory Visit: Payer: Self-pay | Admitting: Adult Health

## 2013-08-13 DIAGNOSIS — M545 Low back pain, unspecified: Secondary | ICD-10-CM

## 2013-08-13 NOTE — Progress Notes (Signed)
Patient notified

## 2013-10-29 ENCOUNTER — Ambulatory Visit: Payer: Self-pay | Admitting: Pain Medicine

## 2013-11-21 ENCOUNTER — Ambulatory Visit: Payer: Self-pay | Admitting: Pain Medicine

## 2014-01-21 ENCOUNTER — Encounter: Payer: Medicare (Managed Care) | Admitting: Internal Medicine

## 2014-02-14 ENCOUNTER — Encounter: Payer: Self-pay | Admitting: Internal Medicine

## 2014-02-14 ENCOUNTER — Ambulatory Visit (INDEPENDENT_AMBULATORY_CARE_PROVIDER_SITE_OTHER): Payer: Medicare Other | Admitting: Internal Medicine

## 2014-02-14 VITALS — BP 129/78 | HR 72 | Temp 97.5°F | Ht 61.0 in | Wt 227.2 lb

## 2014-02-14 DIAGNOSIS — M2550 Pain in unspecified joint: Secondary | ICD-10-CM

## 2014-02-14 DIAGNOSIS — IMO0001 Reserved for inherently not codable concepts without codable children: Secondary | ICD-10-CM

## 2014-02-14 DIAGNOSIS — D510 Vitamin B12 deficiency anemia due to intrinsic factor deficiency: Secondary | ICD-10-CM

## 2014-02-14 DIAGNOSIS — H6693 Otitis media, unspecified, bilateral: Secondary | ICD-10-CM | POA: Insufficient documentation

## 2014-02-14 DIAGNOSIS — Z0000 Encounter for general adult medical examination without abnormal findings: Secondary | ICD-10-CM | POA: Insufficient documentation

## 2014-02-14 DIAGNOSIS — M609 Myositis, unspecified: Secondary | ICD-10-CM

## 2014-02-14 DIAGNOSIS — M791 Myalgia: Secondary | ICD-10-CM

## 2014-02-14 DIAGNOSIS — E669 Obesity, unspecified: Secondary | ICD-10-CM | POA: Insufficient documentation

## 2014-02-14 LAB — COMPREHENSIVE METABOLIC PANEL
ALT: 17 U/L (ref 0–35)
AST: 19 U/L (ref 0–37)
Albumin: 4.2 g/dL (ref 3.5–5.2)
Alkaline Phosphatase: 80 U/L (ref 39–117)
BUN: 12 mg/dL (ref 6–23)
CHLORIDE: 107 meq/L (ref 96–112)
CO2: 25 meq/L (ref 19–32)
CREATININE: 0.6 mg/dL (ref 0.40–1.20)
Calcium: 9.5 mg/dL (ref 8.4–10.5)
GFR: 105.76 mL/min (ref >60.00)
GLUCOSE: 96 mg/dL (ref 70–99)
Potassium: 4.2 mEq/L (ref 3.5–5.1)
Sodium: 143 mEq/L (ref 135–145)
TOTAL PROTEIN: 7.1 g/dL (ref 6.0–8.3)
Total Bilirubin: 0.6 mg/dL (ref 0.2–1.2)

## 2014-02-14 LAB — CBC WITH DIFFERENTIAL/PLATELET
Basophils Absolute: 0 10*3/uL (ref 0.0–0.1)
Basophils Relative: 0.6 % (ref 0.0–3.0)
Eosinophils Absolute: 0.3 10*3/uL (ref 0.0–0.7)
Eosinophils Relative: 3.2 % (ref 0.0–5.0)
HCT: 44.1 % (ref 36.0–46.0)
Hemoglobin: 14.9 g/dL (ref 12.0–15.0)
LYMPHS PCT: 27.6 % (ref 12.0–46.0)
Lymphs Abs: 2.3 10*3/uL (ref 0.7–4.0)
MCHC: 33.8 g/dL (ref 30.0–36.0)
MCV: 87 fl (ref 78.0–100.0)
Monocytes Absolute: 0.6 10*3/uL (ref 0.1–1.0)
Monocytes Relative: 7.8 % (ref 3.0–12.0)
Neutro Abs: 5.1 10*3/uL (ref 1.4–7.7)
Neutrophils Relative %: 60.8 % (ref 43.0–77.0)
Platelets: 196 10*3/uL (ref 150.0–400.0)
RBC: 5.07 Mil/uL (ref 3.87–5.11)
RDW: 13.7 % (ref 11.5–15.5)
WBC: 8.3 10*3/uL (ref 4.0–10.5)

## 2014-02-14 LAB — HEMOGLOBIN A1C: Hgb A1c MFr Bld: 6 % (ref 4.6–6.5)

## 2014-02-14 LAB — MICROALBUMIN / CREATININE URINE RATIO
Creatinine,U: 124.3 mg/dL
MICROALB UR: 1.6 mg/dL (ref 0.0–1.9)
Microalb Creat Ratio: 1.3 mg/g (ref 0.0–30.0)

## 2014-02-14 LAB — LIPID PANEL
Cholesterol: 202 mg/dL — ABNORMAL HIGH (ref 0–200)
HDL: 52.2 mg/dL (ref >39.00)
LDL Cholesterol: 124 mg/dL — ABNORMAL HIGH (ref 0–99)
NONHDL: 149.8
Total CHOL/HDL Ratio: 4
Triglycerides: 128 mg/dL (ref 0.0–149.0)
VLDL: 25.6 mg/dL (ref 0.0–40.0)

## 2014-02-14 LAB — TSH: TSH: 2.73 u[IU]/mL (ref 0.35–4.50)

## 2014-02-14 LAB — VITAMIN D 25 HYDROXY (VIT D DEFICIENCY, FRACTURES): VITD: 27.58 ng/mL — ABNORMAL LOW (ref 30.00–100.00)

## 2014-02-14 LAB — VITAMIN B12: Vitamin B-12: 220 pg/mL (ref 211–911)

## 2014-02-14 MED ORDER — AMOXICILLIN-POT CLAVULANATE 875-125 MG PO TABS: 1.0000 | ORAL_TABLET | Freq: Two times a day (BID) | ORAL | Status: DC

## 2014-02-14 MED ORDER — TRAMADOL HCL 50 MG PO TABS: 50.0000 mg | ORAL_TABLET | Freq: Three times a day (TID) | ORAL | Status: DC | PRN

## 2014-02-14 MED ORDER — ANTIPYRINE-BENZOCAINE 5.4-1.4 % OT SOLN: 3.0000 [drp] | OTIC | Status: DC | PRN

## 2014-02-14 MED ORDER — MELOXICAM 15 MG PO TABS
15.0000 mg | ORAL_TABLET | Freq: Every day | ORAL | Status: DC
Start: 2014-02-14 — End: 2014-09-25

## 2014-02-14 NOTE — Assessment & Plan Note (Signed)
Chronic back pain secondary to spinal stenosis. Will start Meloxicam to see if any improvement. Discussed risks of this medication. Will continue Tramadol prn.

## 2014-02-14 NOTE — Assessment & Plan Note (Signed)
Recent increased muscular pain over arms, legs. Will continue prn Tramadol. Will check electrolytes and B12 with labs.

## 2014-02-14 NOTE — Progress Notes (Signed)
Subjective:    Patient ID: Morgan Hurst, female    DOB: Feb 19, 1945, 69 y.o.   MRN: [id number]  HPI  69YO female presents for acute visit.  Developed severe pain in right ear yesterday. No fever, congestion, sore throat. A few days ago had slight runny nose, but this has resolved. No recent swimming, bathing. Has used Ciprodex in the past for external ear infection, used a left-over prescription all year. No change in hearing.  Back pain - Seen in 07/2013 by Raquel Rey. Had MRI with ortho, found to have spinal stenosis. Started on Tramadol with some improvement. This has been controlling pain very well. Stopped Celebrex because she had aching in legs with this. Symptoms improved with stopping Celebrex.  Past medical, surgical, family and social history per today's encounter.  Review of Systems  Constitutional: Negative for fever, chills, appetite change, fatigue and unexpected weight change.  HENT: Positive for ear pain. Negative for congestion, postnasal drip, rhinorrhea, sinus pressure, sore throat and trouble swallowing.   Eyes: Negative for visual disturbance.  Respiratory: Negative for shortness of breath.   Cardiovascular: Negative for chest pain and leg swelling.  Gastrointestinal: Negative for abdominal pain, diarrhea and constipation.  Musculoskeletal: Positive for myalgias, back pain and arthralgias.  Skin: Negative for color change and rash.  Hematological: Negative for adenopathy. Does not bruise/bleed easily.  Psychiatric/Behavioral: Negative for sleep disturbance and dysphoric mood. The patient is not nervous/anxious.        Objective:    BP 129/78 mmHg  Pulse 72  Temp(Src) 97.5 F (36.4 C) (Oral)  Ht 5' 1"  (1.549 m)  Wt 227 lb 4 oz (103.08 kg)  BMI 42.96 kg/m2  SpO2 96% Physical Exam  Constitutional: She is oriented to person, place, and time. She appears well-developed and well-nourished. No distress.  HENT:  Head: Normocephalic and atraumatic.  Right  Ear: External ear normal. Tympanic membrane is erythematous and bulging. A middle ear effusion is present.  Left Ear: External ear normal. Tympanic membrane is erythematous and bulging. A middle ear effusion is present.  Nose: Nose normal.  Mouth/Throat: Oropharynx is clear and moist. No oropharyngeal exudate.  Eyes: Conjunctivae are normal. Pupils are equal, round, and reactive to light. Right eye exhibits no discharge. Left eye exhibits no discharge. No scleral icterus.  Neck: Normal range of motion. Neck supple. No tracheal deviation present. No thyromegaly present.  Cardiovascular: Normal rate, regular rhythm, normal heart sounds and intact distal pulses.  Exam reveals no gallop and no friction rub.   No murmur heard. Pulmonary/Chest: Effort normal and breath sounds normal. No accessory muscle usage. No tachypnea. No respiratory distress. She has no decreased breath sounds. She has no wheezes. She has no rhonchi. She has no rales. She exhibits no tenderness.  Musculoskeletal: Normal range of motion. She exhibits no edema or tenderness.  Lymphadenopathy:    She has no cervical adenopathy.  Neurological: She is alert and oriented to person, place, and time. No cranial nerve deficit. She exhibits normal muscle tone. Coordination normal.  Skin: Skin is warm and dry. No rash noted. She is not diaphoretic. No erythema. No pallor.  Psychiatric: She has a normal mood and affect. Her behavior is normal. Judgment and thought content normal.          Assessment & Plan:   Problem List Items Addressed This Visit      Unprioritized   Arthralgia    Chronic back pain secondary to spinal stenosis. Will start Meloxicam to see  if any improvement. Discussed risks of this medication. Will continue Tramadol prn.       Bilateral otitis media - Primary    Bilateral OM on exam. Will start Augmentin. Auralgan prn pain. Follow up recheck in 3 weeks or sooner if symptoms not improving.      Relevant  Medications   AMOXICILLIN-POT CLAVULANATE 875-125 MG PO TABS   Myalgia and myositis    Recent increased muscular pain over arms, legs. Will continue prn Tramadol. Will check electrolytes and B12 with labs.      Relevant Orders   CBC with Differential/Platelet   Comprehensive metabolic panel   TSH   V91   Severe obesity (BMI >= 40)    Wt Readings from Last 3 Encounters:  02/14/14 227 lb 4 oz (103.08 kg)  08/08/13 228 lb 4 oz (103.534 kg)  07/25/13 228 lb 8 oz (103.647 kg)   Will check TSH and A1c with labs today.      Relevant Orders   TSH   Hemoglobin A1c    Other Visit Diagnoses    Routine general medical examination at a health care facility        Relevant Medications    meloxicam (MOBIC) tablet    Other Relevant Orders    Lipid panel    Microalbumin / creatinine urine ratio    Vit D  25 hydroxy (rtn osteoporosis monitoring)    Hemoglobin A1c        Return in about 3 weeks (around 03/07/2014) for Wellness Visit.

## 2014-02-14 NOTE — Progress Notes (Signed)
Pre visit review using our clinic review tool, if applicable. No additional management support is needed unless otherwise documented below in the visit note. 

## 2014-02-14 NOTE — Assessment & Plan Note (Signed)
Bilateral OM on exam. Will start Augmentin. Auralgan prn pain. Follow up recheck in 3 weeks or sooner if symptoms not improving.

## 2014-02-14 NOTE — Patient Instructions (Signed)
Start Meloxicam 28m daily to help with arthritis pain.  Start Augmentin twice daily. Use Auralgan as needed for ear pain. Take a probiotic yogurt while on the Augmentin.  Follow up in 3 weeks.

## 2014-02-14 NOTE — Assessment & Plan Note (Signed)
Wt Readings from Last 3 Encounters:  02/14/14 227 lb 4 oz (103.08 kg)  08/08/13 228 lb 4 oz (103.534 kg)  07/25/13 228 lb 8 oz (103.647 kg)   Will check TSH and A1c with labs today.

## 2014-02-20 ENCOUNTER — Other Ambulatory Visit: Payer: Self-pay | Admitting: *Deleted

## 2014-02-20 MED ORDER — AMOXICILLIN-POT CLAVULANATE 875-125 MG PO TABS: 1.0000 | ORAL_TABLET | Freq: Two times a day (BID) | ORAL | Status: DC

## 2014-02-20 NOTE — Telephone Encounter (Signed)
Pt called in, states she has lost 17 of her 20 Augmentin tablets that she was prescribed, requesting more called to pharmacy. States she has looked everywhere for them and cannot find the additional tablets. Rx sent to pharmacy by escript

## 2014-03-18 ENCOUNTER — Ambulatory Visit (INDEPENDENT_AMBULATORY_CARE_PROVIDER_SITE_OTHER): Payer: Medicare Other | Admitting: Internal Medicine

## 2014-03-18 ENCOUNTER — Encounter: Payer: Self-pay | Admitting: Internal Medicine

## 2014-03-18 VITALS — BP 130/75 | HR 75 | Temp 97.5°F | Ht 61.75 in | Wt 224.5 lb

## 2014-03-18 DIAGNOSIS — Z23 Encounter for immunization: Secondary | ICD-10-CM | POA: Diagnosis not present

## 2014-03-18 DIAGNOSIS — L989 Disorder of the skin and subcutaneous tissue, unspecified: Secondary | ICD-10-CM

## 2014-03-18 DIAGNOSIS — E538 Deficiency of other specified B group vitamins: Secondary | ICD-10-CM

## 2014-03-18 DIAGNOSIS — Z1239 Encounter for other screening for malignant neoplasm of breast: Secondary | ICD-10-CM

## 2014-03-18 DIAGNOSIS — Z0000 Encounter for general adult medical examination without abnormal findings: Secondary | ICD-10-CM | POA: Diagnosis not present

## 2014-03-18 DIAGNOSIS — Z780 Asymptomatic menopausal state: Secondary | ICD-10-CM | POA: Diagnosis not present

## 2014-03-18 DIAGNOSIS — Z634 Disappearance and death of family member: Secondary | ICD-10-CM | POA: Insufficient documentation

## 2014-03-18 MED ORDER — CYANOCOBALAMIN 1000 MCG/ML IJ SOLN
1000.0000 ug | Freq: Once | INTRAMUSCULAR | Status: AC
Start: 2014-03-18 — End: 2014-03-18
  Administered 2014-03-18: 1000 ug via INTRAMUSCULAR

## 2014-03-18 NOTE — Addendum Note (Signed)
Addended by: Ronette Deter A on: 03/18/2014 10:06 AM   Modules accepted: Miquel Dunn

## 2014-03-18 NOTE — Assessment & Plan Note (Signed)
General medical exam including breast exam normal except as noted. PAP and pelvic deferred as s/p hysterectomy. Flu and Prevnar vaccines given today. Reviewed recent labs. Encouraged healthy diet and exercise. Follow up in 3 months and prn.

## 2014-03-18 NOTE — Progress Notes (Signed)
Pre visit review using our clinic review tool, if applicable. No additional management support is needed unless otherwise documented below in the visit note. 

## 2014-03-18 NOTE — Patient Instructions (Signed)

## 2014-03-18 NOTE — Assessment & Plan Note (Signed)
Wt Readings from Last 3 Encounters:  03/18/14 224 lb 8 oz (101.833 kg)  02/14/14 227 lb 4 oz (103.08 kg)  08/08/13 228 lb 4 oz (103.534 kg)   Body mass index is 41.42 kg/(m^2). The patient is asked to make an attempt to improve diet and exercise patterns to aid in medical management of this problem.

## 2014-03-18 NOTE — Assessment & Plan Note (Signed)
Mammogram ordered

## 2014-03-18 NOTE — Addendum Note (Signed)
Addended by: Vernetta Honey on: 03/18/2014 10:07 AM   Modules accepted: Orders

## 2014-03-18 NOTE — Progress Notes (Addendum)
Subjective:    Patient ID: Morgan Hurst, female    DOB: Aug 26, 1945, 68 y.o.   MRN: [id number]  HPI  The patient is here for annual Medicare wellness examination and management of other chronic and acute problems.   The risk factors are reflected in the social history.  The roster of all physicians providing medical care to patient - is listed in the Snapshot section of the chart.  Activities of daily living:  The patient is 100% independent in all ADLs: dressing, toileting, feeding as well as independent mobility.  Husband died last week with pneumonia. Had been married 82years.  Home safety : The patient has smoke detectors in the home. They wear seatbelts.  There are no firearms at home. There is no violence in the home.   There is no risks for hepatitis, STDs or HIV. There is no history of blood transfusion. They have no travel history to infectious disease endemic areas of the world.  The patient has seen their dentist in the last six month.  Dentist - unsure name They have seen their eye doctor in the last year. Opthalmologist - [facility]  They have deferred audiologic testing in the last year.   They do not  have excessive sun exposure. Discussed the need for sun protection: hats, long sleeves and use of sunscreen if there is significant sun exposure. Dermatologist - none  Diet: the importance of a healthy diet is discussed. They do have a relatively healthy diet.  The benefits of regular aerobic exercise were discussed. She walks on treadmill at home on occasion, however limited by arthritis. Takes Aleve to help with this.  Depression screen: there are no signs or vegative symptoms of depression- irritability, change in appetite, anhedonia, sadness/tearfullness.  Cognitive assessment: the patient manages all their financial and personal affairs and is actively engaged. They could relate day,date,year and events.   The following portions of the patient's history were  reviewed and updated as appropriate: allergies, current medications, past family history, past medical history,  past surgical history, past social history  and problem list.  Visual acuity was not assessed per patient preference since she has regular follow up with her ophthalmologist. Hearing and body mass index were assessed and reviewed.   During the course of the visit the patient was educated and counseled about appropriate screening and preventive services including : fall prevention , diabetes screening, nutrition counseling, colorectal cancer screening, and recommended immunizations.    Wt Readings from Last 3 Encounters:  03/18/14 224 lb 8 oz (101.833 kg)  02/14/14 227 lb 4 oz (103.08 kg)  08/08/13 228 lb 4 oz (103.534 kg)     Past medical, surgical, family and social history per today's encounter.  Review of Systems  Constitutional: Negative for fever, chills, appetite change, fatigue and unexpected weight change.  Eyes: Negative for visual disturbance.  Respiratory: Negative for shortness of breath.   Cardiovascular: Negative for chest pain and leg swelling.  Gastrointestinal: Positive for anal bleeding (occasional with wiping, h/o external hemorrhoid). Negative for nausea, vomiting, abdominal pain, diarrhea and constipation.  Musculoskeletal: Positive for myalgias and arthralgias.  Skin: Negative for color change and rash.  Hematological: Negative for adenopathy. Does not bruise/bleed easily.  Psychiatric/Behavioral: Negative for suicidal ideas, sleep disturbance and dysphoric mood. The patient is not nervous/anxious.        Objective:    BP 130/75 mmHg  Pulse 75  Temp(Src) 97.5 F (36.4 C) (Oral)  Ht 5' 1.75" (1.568 m)  Wt 224 lb 8 oz (101.833 kg)  BMI 41.42 kg/m2  SpO2 96% Physical Exam  Constitutional: She is oriented to person, place, and time. She appears well-developed and well-nourished. No distress.  HENT:  Head: Normocephalic and atraumatic.  Right Ear:  External ear normal.  Left Ear: External ear normal.  Nose: Nose normal.  Mouth/Throat: Oropharynx is clear and moist. No oropharyngeal exudate.  Eyes: Conjunctivae are normal. Pupils are equal, round, and reactive to light. Right eye exhibits no discharge. Left eye exhibits no discharge. No scleral icterus.  Neck: Normal range of motion. Neck supple. No tracheal deviation present. No thyromegaly present.  Cardiovascular: Normal rate, regular rhythm, normal heart sounds and intact distal pulses.  Exam reveals no gallop and no friction rub.   No murmur heard. Pulmonary/Chest: Effort normal and breath sounds normal. No accessory muscle usage. No tachypnea. No respiratory distress. She has no decreased breath sounds. She has no wheezes. She has no rales. She exhibits no tenderness. Right breast exhibits no inverted nipple, no mass, no nipple discharge, no skin change and no tenderness. Left breast exhibits no inverted nipple, no mass, no nipple discharge, no skin change and no tenderness. Breasts are symmetrical.  Abdominal: Soft. Bowel sounds are normal. She exhibits no distension and no mass. There is no tenderness. There is no rebound and no guarding.  Musculoskeletal: Normal range of motion. She exhibits no edema or tenderness.  Lymphadenopathy:    She has no cervical adenopathy.  Neurological: She is alert and oriented to person, place, and time. No cranial nerve deficit. She exhibits normal muscle tone. Coordination normal.  Skin: Skin is warm and dry. No rash noted. She is not diaphoretic. No erythema. No pallor.     Psychiatric: She has a normal mood and affect. Her behavior is normal. Judgment and thought content normal.          Assessment & Plan:   Problem List Items Addressed This Visit      Unprioritized   Bereavement    Husband died one week ago. Offered support today. Encouraged her to use support from family.      Medicare annual wellness visit, subsequent - Primary     General medical exam including breast exam normal except as noted. PAP and pelvic deferred as s/p hysterectomy. Flu and Prevnar vaccines given today. Reviewed recent labs. Encouraged healthy diet and exercise. Follow up in 3 months and prn.      Postmenopausal estrogen deficiency    Bone density testing ordered.      Relevant Orders   DG Bone Density   Screening for breast cancer    Mammogram ordered.      Relevant Orders   MM Digital Screening   Severe obesity (BMI >= 40)    Wt Readings from Last 3 Encounters:  03/18/14 224 lb 8 oz (101.833 kg)  02/14/14 227 lb 4 oz (103.08 kg)  08/08/13 228 lb 4 oz (103.534 kg)   Body mass index is 41.42 kg/(m^2). The patient is asked to make an attempt to improve diet and exercise patterns to aid in medical management of this problem.       Skin lesion    Skin lesions under breasts most consistent with acrochordon. Will set up dermatology evaluation.      Relevant Orders   Ambulatory referral to Dermatology    Other Visit Diagnoses    Need for vaccination with 13-polyvalent pneumococcal conjugate vaccine        Relevant Orders  Pneumococcal conjugate vaccine 13-valent    B12 deficiency        Relevant Medications    cyanocobalamin ((VITAMIN B-12)) injection 1,000 mcg (Start on 03/18/2014 10:15 AM)        Return in about 3 months (around 06/18/2014) for Recheck.

## 2014-03-18 NOTE — Assessment & Plan Note (Signed)
Husband died one week ago. Offered support today. Encouraged her to use support from family.

## 2014-03-18 NOTE — Assessment & Plan Note (Signed)
Bone density testing ordered.

## 2014-03-18 NOTE — Assessment & Plan Note (Signed)
Skin lesions under breasts most consistent with acrochordon. Will set up dermatology evaluation.

## 2014-04-24 ENCOUNTER — Ambulatory Visit (INDEPENDENT_AMBULATORY_CARE_PROVIDER_SITE_OTHER): Payer: Medicare Other

## 2014-04-24 DIAGNOSIS — E538 Deficiency of other specified B group vitamins: Secondary | ICD-10-CM | POA: Diagnosis not present

## 2014-04-24 MED ORDER — CYANOCOBALAMIN 1000 MCG/ML IJ SOLN
1000.0000 ug | Freq: Once | INTRAMUSCULAR | Status: AC
  Administered 2014-04-24: 1000 ug via INTRAMUSCULAR

## 2014-05-27 ENCOUNTER — Ambulatory Visit (INDEPENDENT_AMBULATORY_CARE_PROVIDER_SITE_OTHER): Payer: Medicare Other | Admitting: *Deleted

## 2014-05-27 DIAGNOSIS — E538 Deficiency of other specified B group vitamins: Secondary | ICD-10-CM

## 2014-05-27 MED ORDER — CYANOCOBALAMIN 1000 MCG/ML IJ SOLN
1000.0000 ug | Freq: Once | INTRAMUSCULAR | Status: AC
  Administered 2014-05-27: 1000 ug via INTRAMUSCULAR

## 2014-06-18 ENCOUNTER — Ambulatory Visit (INDEPENDENT_AMBULATORY_CARE_PROVIDER_SITE_OTHER): Payer: Medicare Other | Admitting: Internal Medicine

## 2014-06-18 ENCOUNTER — Encounter: Payer: Self-pay | Admitting: Internal Medicine

## 2014-06-18 VITALS — BP 118/73 | HR 71 | Temp 97.7°F | Ht 61.75 in | Wt 224.5 lb

## 2014-06-18 DIAGNOSIS — K649 Unspecified hemorrhoids: Secondary | ICD-10-CM | POA: Diagnosis not present

## 2014-06-18 DIAGNOSIS — D510 Vitamin B12 deficiency anemia due to intrinsic factor deficiency: Secondary | ICD-10-CM | POA: Diagnosis not present

## 2014-06-18 DIAGNOSIS — R5382 Chronic fatigue, unspecified: Secondary | ICD-10-CM

## 2014-06-18 DIAGNOSIS — Z634 Disappearance and death of family member: Secondary | ICD-10-CM

## 2014-06-18 MED ORDER — CYANOCOBALAMIN 1000 MCG/ML IJ SOLN
1000.0000 ug | Freq: Once | INTRAMUSCULAR | Status: AC
Start: 2014-06-18 — End: 2014-06-18
  Administered 2014-06-18: 1000 ug via INTRAMUSCULAR

## 2014-06-18 MED ORDER — HYDROCORTISONE ACE-PRAMOXINE 2.5-1 % RE CREA: 1.0000 "application " | TOPICAL_CREAM | Freq: Three times a day (TID) | RECTAL | Status: DC

## 2014-06-18 NOTE — Addendum Note (Signed)
Addended by: Vernetta Honey on: 06/18/2014 09:55 AM   Modules accepted: Orders

## 2014-06-18 NOTE — Assessment & Plan Note (Signed)
Continue monthly B12 injection

## 2014-06-18 NOTE — Progress Notes (Signed)
Subjective:    Patient ID: Morgan Hurst, female    DOB: 28-Oct-1945, 69 y.o.   MRN: [id number]  HPI  69YO female presents for follow up.  Improvement in fatigue with B12.  Hemorrhoid - Notes nodular area at anal sphincter. No bleeding recently. Has been using Preparation H. Area is not painful. Concerned because her mother had colon cancer.  Notes some difficulty sleeping since her husband passed away. Sleeps occasionally at her daughter's house.  Wt Readings from Last 3 Encounters:  06/18/14 224 lb 8 oz (101.833 kg)  03/18/14 224 lb 8 oz (101.833 kg)  02/14/14 227 lb 4 oz (103.08 kg)    Past medical, surgical, family and social history per today's encounter.  Review of Systems  Constitutional: Negative for fever, chills, appetite change, fatigue and unexpected weight change.  Eyes: Negative for visual disturbance.  Respiratory: Negative for shortness of breath.   Cardiovascular: Negative for chest pain and leg swelling.  Gastrointestinal: Negative for nausea, vomiting, abdominal pain, diarrhea and constipation.  Skin: Negative for color change and rash.  Hematological: Negative for adenopathy. Does not bruise/bleed easily.  Psychiatric/Behavioral: Positive for sleep disturbance and dysphoric mood. The patient is not nervous/anxious.        Objective:    BP 118/73 mmHg  Pulse 71  Temp(Src) 97.7 F (36.5 C) (Oral)  Ht 5' 1.75" (1.568 m)  Wt 224 lb 8 oz (101.833 kg)  BMI 41.42 kg/m2  SpO2 95% Physical Exam  Constitutional: She is oriented to person, place, and time. She appears well-developed and well-nourished. No distress.  HENT:  Head: Normocephalic and atraumatic.  Right Ear: External ear normal.  Left Ear: External ear normal.  Nose: Nose normal.  Mouth/Throat: Oropharynx is clear and moist.  Eyes: Conjunctivae are normal. Pupils are equal, round, and reactive to light. Right eye exhibits no discharge. Left eye exhibits no discharge. No scleral icterus.    Neck: Normal range of motion. Neck supple. No tracheal deviation present. No thyromegaly present.  Cardiovascular: Normal rate, regular rhythm, normal heart sounds and intact distal pulses.  Exam reveals no gallop and no friction rub.   No murmur heard. Pulmonary/Chest: Effort normal and breath sounds normal. No respiratory distress. She has no wheezes. She has no rales. She exhibits no tenderness.  Genitourinary: Rectal exam shows external hemorrhoid. Rectal exam shows no fissure, no mass, no tenderness and anal tone normal.     Musculoskeletal: Normal range of motion. She exhibits no edema or tenderness.  Lymphadenopathy:    She has no cervical adenopathy.  Neurological: She is alert and oriented to person, place, and time. No cranial nerve deficit. She exhibits normal muscle tone. Coordination normal.  Skin: Skin is warm and dry. No rash noted. She is not diaphoretic. No erythema. No pallor.  Psychiatric: She has a normal mood and affect. Her behavior is normal. Judgment and thought content normal.          Assessment & Plan:   Problem List Items Addressed This Visit      Unprioritized   Fatigue    Fatigue improved with B12 supplementation. Will continue.      Hemorrhoid - Primary    Exam is consistent with external hemorrhoid. Will start Analpram. Recheck 4 weeks. If no improvement, then plan for referral to general surgery.      Pernicious anemia    Continue monthly B12 injection          Return in about 4 weeks (around 07/16/2014).

## 2014-06-18 NOTE — Patient Instructions (Signed)

## 2014-06-18 NOTE — Assessment & Plan Note (Signed)
Exam is consistent with external hemorrhoid. Will start Analpram. Recheck 4 weeks. If no improvement, then plan for referral to general surgery.

## 2014-06-18 NOTE — Assessment & Plan Note (Signed)
Fatigue improved with B12 supplementation. Will continue.

## 2014-06-18 NOTE — Assessment & Plan Note (Signed)
Offered support today.

## 2014-06-18 NOTE — Progress Notes (Signed)
Pre visit review using our clinic review tool, if applicable. No additional management support is needed unless otherwise documented below in the visit note. 

## 2014-06-23 DIAGNOSIS — Z1231 Encounter for screening mammogram for malignant neoplasm of breast: Secondary | ICD-10-CM | POA: Diagnosis not present

## 2014-06-23 LAB — HM MAMMOGRAPHY: HM Mammogram: NEGATIVE

## 2014-06-25 ENCOUNTER — Other Ambulatory Visit: Payer: Self-pay | Admitting: Internal Medicine

## 2014-06-25 ENCOUNTER — Telehealth: Payer: Self-pay | Admitting: *Deleted

## 2014-06-25 NOTE — Telephone Encounter (Signed)
She will need to ask pharmacy which brands are covered.

## 2014-06-25 NOTE — Telephone Encounter (Signed)
Spoke with pt, advised of MDs message.  Pt states she would call.

## 2014-06-25 NOTE — Telephone Encounter (Signed)
Last OV 6.1.16.  Please advise refill

## 2014-06-25 NOTE — Telephone Encounter (Signed)
Pt called states the Hydrocortisone-pramoxine rectal cream is not covered by insurance.  Pt is requesting a different medication.  Please advise

## 2014-06-30 ENCOUNTER — Encounter: Payer: Self-pay | Admitting: *Deleted

## 2014-06-30 ENCOUNTER — Telehealth: Payer: Self-pay | Admitting: *Deleted

## 2014-06-30 NOTE — Telephone Encounter (Signed)
Shirlee Limerick called states Hydrocortisone cream is not covered under insurance.  However states Proctocone 2.5% or Proctosol 2.5% creams are covered.  Please advise

## 2014-07-01 ENCOUNTER — Ambulatory Visit: Payer: Medicare Other

## 2014-07-01 MED ORDER — HYDROCORTISONE 2.5 % RE CREA: 1.0000 "application " | TOPICAL_CREAM | Freq: Two times a day (BID) | RECTAL | Status: DC

## 2014-07-24 ENCOUNTER — Ambulatory Visit: Payer: Medicare Other | Admitting: Family Medicine

## 2014-07-27 DIAGNOSIS — S90869A Insect bite (nonvenomous), unspecified foot, initial encounter: Secondary | ICD-10-CM | POA: Diagnosis not present

## 2014-07-27 DIAGNOSIS — L03119 Cellulitis of unspecified part of limb: Secondary | ICD-10-CM | POA: Diagnosis not present

## 2014-08-01 ENCOUNTER — Ambulatory Visit (INDEPENDENT_AMBULATORY_CARE_PROVIDER_SITE_OTHER)
Admission: RE | Admit: 2014-08-01 | Discharge: 2014-08-01 | Disposition: A | Discharge status: Home / Self Care | Payer: Medicare Other | Source: Ambulatory Visit | Attending: Family Medicine | Admitting: Family Medicine

## 2014-08-01 ENCOUNTER — Encounter: Payer: Self-pay | Admitting: Family Medicine

## 2014-08-01 ENCOUNTER — Ambulatory Visit (INDEPENDENT_AMBULATORY_CARE_PROVIDER_SITE_OTHER): Payer: Medicare Other | Admitting: Family Medicine

## 2014-08-01 VITALS — BP 120/80 | HR 79 | Ht 61.75 in | Wt 221.0 lb

## 2014-08-01 DIAGNOSIS — M25551 Pain in right hip: Secondary | ICD-10-CM

## 2014-08-01 DIAGNOSIS — M48062 Spinal stenosis, lumbar region with neurogenic claudication: Secondary | ICD-10-CM | POA: Insufficient documentation

## 2014-08-01 DIAGNOSIS — M1611 Unilateral primary osteoarthritis, right hip: Secondary | ICD-10-CM | POA: Diagnosis not present

## 2014-08-01 DIAGNOSIS — M4806 Spinal stenosis, lumbar region: Secondary | ICD-10-CM

## 2014-08-01 MED ORDER — GABAPENTIN 100 MG PO CAPS: 100.0000 mg | ORAL_CAPSULE | Freq: Every day | ORAL | Status: DC

## 2014-08-01 NOTE — Progress Notes (Signed)
[facility] [address] [place], [place] [address] Phone: [phone] Subjective:    I'm seeing this patient by the request  of:  Rica Mast, MD   CC: Back pain  Morgan Hurst is a 69 y.o. female coming in with complaint of  Back pain. Patient has had low back pain for quite some time. Patient has had workup including imaging. Patient has had mild narrowing of L2-S1 on x-ray as well as facet arthropathy one year ago. This was reviewed by me. Patient also had an MRI from an outside facility in 2015 that was reviewed by me. This showed the patient did have moderate stenosis at multiple levels. Patient states she is having pain and seems to be somewhat similar but also different. States that it is actually now more localized on the right side. Some traveling down the right thigh as well. Patient discusses pain as a dull aching sensation. Patient states it seems to be worse with activity and better with rest. Denies any numbness and denies any significant weakness but states that getting in and out of a car can be difficult. Pain is 7 out of 10.   Past Medical History  Diagnosis Date  . Arthritis   . Endometriosis    Past Surgical History  Procedure Laterality Date  . Abdominal hysterectomy  age 29  . Ankle surgery    . Vaginal delivery  2   History  Substance Use Topics  . Smoking status: Never Smoker   . Smokeless tobacco: Not on file  . Alcohol Use: No   No Known Allergies Family History  Problem Relation Age of Onset  . Diabetes Mother   . Cancer Mother     colon and breast  . Cancer Father     Leukemia  . Cancer Brother     colon and lung  . Cholelithiasis Daughter   . Heart disease Paternal Grandfather   . Cancer Other 29    colon       Past medical history, social, surgical and family history all reviewed in electronic medical record.   Review of Systems: No headache, visual changes, nausea, vomiting,  diarrhea, constipation, dizziness, abdominal pain, skin rash, fevers, chills, night sweats, weight loss, swollen lymph nodes, body aches, joint swelling, muscle aches, chest pain, shortness of breath, mood changes.   Objective Blood pressure 120/80, pulse 79, height 5' 1.75" (1.568 m), weight 221 lb (100.245 kg), SpO2 95 %. Body mass index is 40.77 kg/(m^2).   General: No apparent distress alert and oriented x3 mood and affect normal, dressed appropriately. Obese HEENT: Pupils equal, extraocular movements intact  Respiratory: Patient's speak in full sentences and does not appear short of breath  Cardiovascular: No lower extremity edema, non tender, no erythema  Skin: Warm dry intact with no signs of infection or rash on extremities or on axial skeleton.  Abdomen: Soft nontender  Neuro: Cranial nerves II through XII are intact, neurovascularly intact in all extremities with 2+ DTRs and 2+ pulses.  Lymph: No lymphadenopathy of posterior or anterior cervical chain or axillae bilaterally.  Gait mild antalgic gait  MSK:  Non tender with full range of motion and good stability and symmetric strength and tone of shoulders, elbows, wrist, knee and ankles bilaterally. Mild to moderate osteophytic changes of multiple joints Back Exam:  Inspection: Unremarkable  Motion: Flexion 35 deg, Extension 25 deg, Side Bending to 35 deg bilaterally,  Rotation to 35 deg bilaterally  SLR  laying: Positive right XSLR laying: Negative  Palpable tenderness: Tender over the paraspinal musculature of the lumbar spine FABER: Positive right Sensory change: Gross sensation intact to all lumbar and sacral dermatomes.  Reflexes: 2+ at both patellar tendons, 2+ at achilles tendons, Babinski's downgoing.  Strength at foot  Plantar-flexion: 5/5 Dorsi-flexion: 5/5 Eversion: 5/5 Inversion: 5/5  Leg strength  Quad: 5/5 Hamstring: 5/5 Hip flexor: 5/5 Hip abductors: 4/5  Positive pain with internal rotation of the hip near the  groin  Procedure note 97110; 15 minutes spent for Therapeutic exercises as stated in above notes.  This included exercises focusing on stretching, strengthening, with significant focus on eccentric aspects.  Low back exercises that included:  Pelvic tilt/bracing instruction to focus on control of the pelvic girdle and lower abdominal muscles  Glute strengthening exercises, focusing on proper firing of the glutes without engaging the low back muscles Proper stretching techniques for maximum relief for the hamstrings, hip flexors, low back and some rotation where tolerated Proper technique shown and discussed handout in great detail with ATC.  All questions were discussed and answered.     Impression and Recommendations:     This case required medical decision making of moderate complexity.

## 2014-08-01 NOTE — Progress Notes (Signed)
Pre visit review using our clinic review tool, if applicable. No additional management support is needed unless otherwise documented below in the visit note. 

## 2014-08-01 NOTE — Patient Instructions (Addendum)
Good to see you xrays downstairs today Ice 20 minutes 2 times daily. Usually after activity and before bed. Exercises 3 times a week.  Gabapentin 165m at night Take tylenol 500 mg three times a day is the best evidence based medicine we have for arthritis.  Vitamin D 2000 IU daily Fish oil 2 grams daily.  Tumeric 5078mtwice daily.  It's important that you continue to stay active. Controlling your weight is important.  Good shoes with rigid bottom.  Morgan MulletMerrell or New balance greater then [employer] cycling with low resistance are the best two types of exercise for arthritis. Come back and see me in 3-4 weeks.

## 2014-08-01 NOTE — Assessment & Plan Note (Signed)
Encourage weight loss

## 2014-08-01 NOTE — Assessment & Plan Note (Signed)
Discussed with patient at underlying spinal stenosis likely is causing some of this with a positive straight leg test. Patient does have pain with internal rotation of the right hip that is concerning for more of a hip arthritis that would give radicular symptoms down the anterior aspect of the thigh as well. We discussed with patient and patient would like to try conservative therapy. Patient given home exercises, icing protocol, encourage weight loss, as well as gabapentin at night. Patient come back again in 2-3 weeks for further evaluation. If continuing to have discomfort we may need to consider an intra-articular hip injection first possibly epidural. We will discuss with patient at follow-up. Patient knows to call if any worsening symptoms such as weakness of the leg or bowel or bladder incontinence.

## 2014-08-05 ENCOUNTER — Encounter: Payer: Self-pay | Admitting: Internal Medicine

## 2014-08-05 ENCOUNTER — Ambulatory Visit (INDEPENDENT_AMBULATORY_CARE_PROVIDER_SITE_OTHER): Payer: Medicare Other | Admitting: Internal Medicine

## 2014-08-05 VITALS — BP 125/69 | HR 72 | Temp 98.0°F | Ht 61.75 in | Wt 226.1 lb

## 2014-08-05 DIAGNOSIS — M48062 Spinal stenosis, lumbar region with neurogenic claudication: Secondary | ICD-10-CM

## 2014-08-05 DIAGNOSIS — Z79891 Long term (current) use of opiate analgesic: Secondary | ICD-10-CM | POA: Diagnosis not present

## 2014-08-05 DIAGNOSIS — E538 Deficiency of other specified B group vitamins: Secondary | ICD-10-CM | POA: Diagnosis not present

## 2014-08-05 DIAGNOSIS — M4806 Spinal stenosis, lumbar region: Secondary | ICD-10-CM

## 2014-08-05 DIAGNOSIS — K649 Unspecified hemorrhoids: Secondary | ICD-10-CM

## 2014-08-05 LAB — COMPREHENSIVE METABOLIC PANEL
ALT: 22 U/L (ref 0–35)
AST: 21 U/L (ref 0–37)
Albumin: 3.8 g/dL (ref 3.5–5.2)
Alkaline Phosphatase: 71 U/L (ref 39–117)
BUN: 18 mg/dL (ref 6–23)
CALCIUM: 9.5 mg/dL (ref 8.4–10.5)
CHLORIDE: 102 meq/L (ref 96–112)
CO2: 28 meq/L (ref 19–32)
Creatinine, Ser: 0.75 mg/dL (ref 0.40–1.20)
GFR: 81.64 mL/min (ref >60.00)
Glucose, Bld: 80 mg/dL (ref 70–99)
POTASSIUM: 3.7 meq/L (ref 3.5–5.1)
SODIUM: 138 meq/L (ref 135–145)
TOTAL PROTEIN: 6.9 g/dL (ref 6.0–8.3)
Total Bilirubin: 0.4 mg/dL (ref 0.2–1.2)

## 2014-08-05 MED ORDER — CYANOCOBALAMIN 1000 MCG/ML IJ SOLN
1000.0000 ug | Freq: Once | INTRAMUSCULAR | Status: AC
  Administered 2014-08-05: 1000 ug via INTRAMUSCULAR

## 2014-08-05 NOTE — Patient Instructions (Signed)
Stop Cephalexin.  Start Gabapentin as instructed by Dr. [name].  Start exercises as described by Dr. [name].  Follow up in 3 months.

## 2014-08-05 NOTE — Assessment & Plan Note (Signed)
Symptoms have resolved. Will continue prn hydrocortisone.

## 2014-08-05 NOTE — Assessment & Plan Note (Signed)
Wt Readings from Last 3 Encounters:  08/05/14 226 lb 2 oz (102.57 kg)  08/01/14 221 lb (100.245 kg)  06/18/14 224 lb 8 oz (101.833 kg)   Body mass index is 41.72 kg/(m^2). Encouraged healthy diet and exercise in effort for weight loss.

## 2014-08-05 NOTE — Assessment & Plan Note (Signed)
Reviewed notes from Dr. [name]. Planning to start Gabapentin, Tumeric. Continue prn Tramadol. Focus on weight loss and exercise. Follow up with Dr. [name] in 08/2014 and here in 3 months.

## 2014-08-05 NOTE — Addendum Note (Signed)
Addended by: Vernetta Honey on: 08/05/2014 10:53 AM   Modules accepted: Orders

## 2014-08-05 NOTE — Progress Notes (Signed)
Pre visit review using our clinic review tool, if applicable. No additional management support is needed unless otherwise documented below in the visit note. 

## 2014-08-05 NOTE — Progress Notes (Signed)
Subjective:    Patient ID: Morgan Hurst, female    DOB: 1945/11/21, 69 y.o.   MRN: [id number]  HPI  69YO female presents for follw up.  Evaluated in 6/1 for hemorrhoid. Hemorrhoid has improved.  Back pain - Recently seen by Dr. [name].Recommended gabapentin at night, exercises, and Tumeric. Has not started this regimen yet. Follow up scheduled for 4 weeks. Continues to have pain in lower back, which radiates down and around right leg. Feels that leg may give out on her at times. No weakness noted though.  Recently seen at Urgent Care for ant bites and skin infection. Treated with Bactrim and Keflex and Prednisone. Skin redness has completely resolved. No    Wt Readings from Last 3 Encounters:  08/05/14 226 lb 2 oz (102.57 kg)  08/01/14 221 lb (100.245 kg)  06/18/14 224 lb 8 oz (101.833 kg)     Past medical, surgical, family and social history per today's encounter.  Review of Systems  Constitutional: Negative for fever, chills, appetite change, fatigue and unexpected weight change.  Eyes: Negative for visual disturbance.  Respiratory: Negative for shortness of breath.   Cardiovascular: Negative for chest pain and leg swelling.  Gastrointestinal: Negative for nausea, vomiting, abdominal pain, diarrhea, constipation and blood in stool.  Musculoskeletal: Positive for myalgias, back pain and arthralgias.  Skin: Negative for color change and rash.  Neurological: Positive for numbness. Negative for weakness.  Hematological: Negative for adenopathy. Does not bruise/bleed easily.  Psychiatric/Behavioral: Negative for dysphoric mood. The patient is not nervous/anxious.        Objective:    BP 125/69 mmHg  Pulse 72  Temp(Src) 98 F (36.7 C) (Oral)  Ht 5' 1.75" (1.568 m)  Wt 226 lb 2 oz (102.57 kg)  BMI 41.72 kg/m2  SpO2 96% Physical Exam  Constitutional: She is oriented to person, place, and time. She appears well-developed and well-nourished. No distress.  HENT:  Head:  Normocephalic and atraumatic.  Right Ear: External ear normal.  Left Ear: External ear normal.  Nose: Nose normal.  Mouth/Throat: Oropharynx is clear and moist. No oropharyngeal exudate.  Eyes: Conjunctivae are normal. Pupils are equal, round, and reactive to light. Right eye exhibits no discharge. Left eye exhibits no discharge. No scleral icterus.  Neck: Normal range of motion. Neck supple. No tracheal deviation present. No thyromegaly present.  Cardiovascular: Normal rate, regular rhythm, normal heart sounds and intact distal pulses.  Exam reveals no gallop and no friction rub.   No murmur heard. Pulmonary/Chest: Effort normal and breath sounds normal. No respiratory distress. She has no wheezes. She has no rales. She exhibits no tenderness.  Musculoskeletal: She exhibits no edema.       Lumbar back: She exhibits decreased range of motion, tenderness and pain. She exhibits no bony tenderness.  Lymphadenopathy:    She has no cervical adenopathy.  Neurological: She is alert and oriented to person, place, and time. No cranial nerve deficit. She exhibits normal muscle tone. Coordination normal.  Skin: Skin is warm and dry. No rash noted. She is not diaphoretic. No erythema. No pallor.  Psychiatric: She has a normal mood and affect. Her behavior is normal. Judgment and thought content normal.          Assessment & Plan:   Problem List Items Addressed This Visit      Unprioritized   Hemorrhoid    Symptoms have resolved. Will continue prn hydrocortisone.      Severe obesity (BMI >= 40)  Wt Readings from Last 3 Encounters:  08/05/14 226 lb 2 oz (102.57 kg)  08/01/14 221 lb (100.245 kg)  06/18/14 224 lb 8 oz (101.833 kg)   Body mass index is 41.72 kg/(m^2). Encouraged healthy diet and exercise in effort for weight loss.      Spinal stenosis, lumbar region, with neurogenic claudication - Primary    Reviewed notes from Dr. [name]. Planning to start Gabapentin, Tumeric. Continue  prn Tramadol. Focus on weight loss and exercise. Follow up with Dr. [name] in 08/2014 and here in 3 months.      Relevant Orders   Comprehensive metabolic panel       Return in about 3 months (around 11/05/2014) for Recheck.

## 2014-08-06 ENCOUNTER — Encounter: Payer: Self-pay | Admitting: *Deleted

## 2014-08-25 ENCOUNTER — Encounter: Payer: Self-pay | Admitting: Internal Medicine

## 2014-08-29 ENCOUNTER — Encounter: Payer: Self-pay | Admitting: Family Medicine

## 2014-08-29 ENCOUNTER — Other Ambulatory Visit (INDEPENDENT_AMBULATORY_CARE_PROVIDER_SITE_OTHER): Payer: Medicare Other

## 2014-08-29 ENCOUNTER — Ambulatory Visit (INDEPENDENT_AMBULATORY_CARE_PROVIDER_SITE_OTHER): Payer: Medicare Other | Admitting: Family Medicine

## 2014-08-29 VITALS — BP 122/82 | HR 74 | Wt 224.0 lb

## 2014-08-29 DIAGNOSIS — M25551 Pain in right hip: Secondary | ICD-10-CM

## 2014-08-29 DIAGNOSIS — M1990 Unspecified osteoarthritis, unspecified site: Secondary | ICD-10-CM | POA: Diagnosis not present

## 2014-08-29 DIAGNOSIS — M1611 Unilateral primary osteoarthritis, right hip: Secondary | ICD-10-CM | POA: Insufficient documentation

## 2014-08-29 NOTE — Patient Instructions (Addendum)
Good to see you Ice is your friend We tried an injection today and can repeat every 3 months.  Continue the vitamins Tylenol 523m 3 times daily See me again in 3-4 weeks

## 2014-08-29 NOTE — Assessment & Plan Note (Signed)
Patient was given an injection today. We discussed continuing the conservative therapy. I do think that that right hip arthritis is likely contributing to the pain. Patient will come back and see me again in 4 weeks after make any changes and we will she will make improvement. We did discuss at some point patient may need hip replacement.  Spent  25 minutes with patient face-to-face and had greater than 50% of counseling including as described above in assessment and plan.

## 2014-08-29 NOTE — Progress Notes (Signed)
[facility] [place],  [address] Phone: [phone] Subjective:    I'm seeing this patient by the request  of:  Rica Mast, MD   CC: Back pain follow up  AGT:Morgan LAYKEN Hurst is a 69 y.o. female coming in with complaint of  Back pain. Patient has had low back pain for quite some time. Patient has had workup including imaging. Patient has had mild narrowing of L2-S1 on x-ray as well as facet arthropathy one year ago. This was reviewed by me. Patient also had an MRI from an outside facility in 2015 that was reviewed by me. This showed the patient did have moderate stenosis at multiple levels. Patient though is having more of a hip pain previously and x-rays were ordered. Patient does have severe osteophytic changes of the hip. Patient states that the back pain is better with the vitamins as well as doing the exercises on a regular basis. Patient states though that if she walks for long amount of time unfortunately she has more pain in the groin as well as the posterior aspect of the right leg and buttocks. Not as much in the back. States that this is what is stopping her from activity more than anything else. Denies any radiation down the leg or any numbness but states sometimes her leg feels heavy.    Past Medical History  Diagnosis Date  . Arthritis   . Endometriosis    Past Surgical History  Procedure Laterality Date  . Abdominal hysterectomy  age 54  . Ankle surgery    . Vaginal delivery  2   Social History  Substance Use Topics  . Smoking status: Never Smoker   . Smokeless tobacco: None  . Alcohol Use: No   No Known Allergies Family History  Problem Relation Age of Onset  . Diabetes Mother   . Cancer Mother     colon and breast  . Cancer Father     Leukemia  . Cancer Brother     colon and lung  . Cholelithiasis Daughter   . Heart disease Paternal Grandfather   . Cancer Other 29    colon        Past medical history, social, surgical and family history all reviewed in electronic medical record.   Review of Systems: No headache, visual changes, nausea, vomiting, diarrhea, constipation, dizziness, abdominal pain, skin rash, fevers, chills, night sweats, weight loss, swollen lymph nodes, body aches, joint swelling, muscle aches, chest pain, shortness of breath, mood changes.   Objective Blood pressure 122/82, pulse 74, weight 224 lb (101.606 kg), SpO2 96 %. Body mass index is 41.33 kg/(m^2).   General: No apparent distress alert and oriented x3 mood and affect normal, dressed appropriately. Obese HEENT: Pupils equal, extraocular movements intact  Respiratory: Patient's speak in full sentences and does not appear short of breath  Cardiovascular: No lower extremity edema, non tender, no erythema  Skin: Warm dry intact with no signs of infection or rash on extremities or on axial skeleton.  Abdomen: Soft nontender  Neuro: Cranial nerves II through XII are intact, neurovascularly intact in all extremities with 2+ DTRs and 2+ pulses.  Lymph: No lymphadenopathy of posterior or anterior cervical chain or axillae bilaterally.  Gait mild antalgic gait  MSK:  Non tender with full range of motion and good stability and symmetric strength and tone of shoulders, elbows, wrist, knee and ankles bilaterally. Mild to moderate osteophytic changes of multiple joints Back  Exam:  Inspection: Unremarkable  Motion: Flexion 35 deg, Extension 25 deg, Side Bending to 35 deg bilaterally,  Rotation to 35 deg bilaterally  SLR laying: Positive right XSLR laying: Negative  Palpable tenderness: Tender over the paraspinal musculature of the lumbar spine FABER: Positive right Sensory change: Gross sensation intact to all lumbar and sacral dermatomes.  Reflexes: 2+ at both patellar tendons, 2+ at achilles tendons, Babinski's downgoing.  Strength at foot  Plantar-flexion: 5/5 Dorsi-flexion: 5/5 Eversion: 5/5  Inversion: 5/5  Leg strength  Quad: 5/5 Hamstring: 5/5 Hip flexor: 5/5 Hip abductors: 4/5  Positive pain with internal rotation of the hip near the groin  Procedure: Real-time Ultrasound Guided Injection of right hip Device: GE Logiq E  Ultrasound guided injection is preferred based studies that show increased duration, increased effect, greater accuracy, decreased procedural pain, increased response rate with ultrasound guided versus blind injection.  Verbal informed consent obtained.  Time-out conducted.  Noted no overlying erythema, induration, or other signs of local infection.  Skin prepped in a sterile fashion.  Local anesthesia: Topical Ethyl chloride.  With sterile technique and under real time ultrasound guidance:  Anterior capsule visualized, needle visualized going to the head neck junction at the anterior capsule. Pictures taken. Patient did have injection of 3 cc of 1% lidocaine, 3 cc of 0.5% Marcaine, and 1 cc of Kenalog 40 mg/dL. Completed without difficulty  Pain immediately resolved suggesting accurate placement of the medication.  Advised to call if fevers/chills, erythema, induration, drainage, or persistent bleeding.  Images permanently stored and available for review in the ultrasound unit.  Impression: Technically successful ultrasound guided injection.     Impression and Recommendations:     This case required medical decision making of moderate complexity.

## 2014-09-04 ENCOUNTER — Ambulatory Visit (INDEPENDENT_AMBULATORY_CARE_PROVIDER_SITE_OTHER): Payer: Medicare Other

## 2014-09-04 DIAGNOSIS — E538 Deficiency of other specified B group vitamins: Secondary | ICD-10-CM

## 2014-09-04 MED ORDER — CYANOCOBALAMIN 1000 MCG/ML IJ SOLN
1000.0000 ug | Freq: Once | INTRAMUSCULAR | Status: AC
  Administered 2014-09-04: 1000 ug via INTRAMUSCULAR

## 2014-09-04 NOTE — Progress Notes (Signed)
Patient came in for monthly b12 injection.  Received in Left deltoid.  Patient tolerated well.

## 2014-09-25 ENCOUNTER — Encounter: Payer: Self-pay | Admitting: Family Medicine

## 2014-09-25 ENCOUNTER — Ambulatory Visit (INDEPENDENT_AMBULATORY_CARE_PROVIDER_SITE_OTHER): Payer: Medicare Other | Admitting: Family Medicine

## 2014-09-25 ENCOUNTER — Other Ambulatory Visit: Payer: Self-pay | Admitting: Internal Medicine

## 2014-09-25 VITALS — BP 126/84 | HR 74 | Ht 61.75 in | Wt 224.0 lb

## 2014-09-25 DIAGNOSIS — M1990 Unspecified osteoarthritis, unspecified site: Secondary | ICD-10-CM

## 2014-09-25 DIAGNOSIS — M1611 Unilateral primary osteoarthritis, right hip: Secondary | ICD-10-CM

## 2014-09-25 MED ORDER — GABAPENTIN 100 MG PO CAPS: 100.0000 mg | ORAL_CAPSULE | Freq: Every day | ORAL | Status: DC

## 2014-09-25 NOTE — Assessment & Plan Note (Signed)
Doing very well with the home exercises in the icing protocol at this time. We discussed that we can repeat the injection every 3 months if necessary. Patient will likely need a hip replacement at some point but hopefully this will not pay for quite some time. Patient has tramadol for breakthrough pain and the meloxicam. Patient will come back and see me again in 2 months for further evaluation and treatment.

## 2014-09-25 NOTE — Progress Notes (Signed)
[facility] [address] [place], [place] [address] Phone: [phone] Subjective:    I'm seeing this patient by the request  of:  Rica Mast, MD   CC: Back pain follow up, hip pain follow up  TOI:[id number] Morgan Hurst is a 69 y.o. female coming in with complaint of  Back pain. Patient has had low back pain for quite some time. Patient has had workup including imaging. Patient has had mild narrowing of L2-S1 on x-ray as well as facet arthropathy one year ago. . This showed the patient did have moderate stenosis at multiple levels.  Patient was found to have severe osteoarthritic changes of the right hip. Patient elected to attempt a intra-articular hip injection one month ago. Patient had this done and was going to continue conservative therapy with anti-inflammatory's, tramadol for breakthrough pain, and home exercises. Patient states she is doing 90-95% better. States that she Regular daily activities on a much more regular basis. Denies any radiation down the leg. States though that overall she is doing well but has to increase the amount of activity she is doing around the house concern daughter is still not doing well with her back.    Past Medical History  Diagnosis Date  . Arthritis   . Endometriosis    Past Surgical History  Procedure Laterality Date  . Abdominal hysterectomy  age 84  . Ankle surgery    . Vaginal delivery  2   Social History  Substance Use Topics  . Smoking status: Never Smoker   . Smokeless tobacco: Not on file  . Alcohol Use: No   No Known Allergies Family History  Problem Relation Age of Onset  . Diabetes Mother   . Cancer Mother     colon and breast  . Cancer Father     Leukemia  . Cancer Brother     colon and lung  . Cholelithiasis Daughter   . Heart disease Paternal Grandfather   . Cancer Other 29    colon       Past medical history, social, surgical and family history all reviewed in  electronic medical record.   Review of Systems: No headache, visual changes, nausea, vomiting, diarrhea, constipation, dizziness, abdominal pain, skin rash, fevers, chills, night sweats, weight loss, swollen lymph nodes, body aches, joint swelling, muscle aches, chest pain, shortness of breath, mood changes.   Objective There were no vitals taken for this visit. There is no weight on file to calculate BMI.   General: No apparent distress alert and oriented x3 mood and affect normal, dressed appropriately. Obese HEENT: Pupils equal, extraocular movements intact  Respiratory: Patient's speak in full sentences and does not appear short of breath  Cardiovascular: No lower extremity edema, non tender, no erythema  Skin: Warm dry intact with no signs of infection or rash on extremities or on axial skeleton.  Abdomen: Soft nontender  Neuro: Cranial nerves II through XII are intact, neurovascularly intact in all extremities with 2+ DTRs and 2+ pulses.  Lymph: No lymphadenopathy of posterior or anterior cervical chain or axillae bilaterally.  Gait mild antalgic gait  MSK:  Non tender with full range of motion and good stability and symmetric strength and tone of shoulders, elbows, wrist, knee and ankles bilaterally. Mild to moderate osteophytic changes of multiple joints Back Exam:  Inspection: Unremarkable  Motion: Flexion 35 deg, Extension 25 deg, Side Bending to 35 deg bilaterally,  Rotation to 35 deg bilaterally  SLR laying:  Positive right XSLR laying: Negative  Palpable tenderness: Minimal tenderness over the per spinal musculature of the lumbar spine FABER: Positive right Sensory change: Gross sensation intact to all lumbar and sacral dermatomes.  Reflexes: 2+ at both patellar tendons, 2+ at achilles tendons, Babinski's downgoing.  Strength at foot  Plantar-flexion: 5/5 Dorsi-flexion: 5/5 Eversion: 5/5 Inversion: 5/5  Leg strength  Quad: 5/5 Hamstring: 5/5 Hip flexor: 5/5 Hip abductors:  4/5  Continued pain with internal rotation but improved from previous exam     Impression and Recommendations:     This case required medical decision making of moderate complexity.

## 2014-09-25 NOTE — Telephone Encounter (Signed)
Last OV 7.19.16.  Please advise refill

## 2014-09-25 NOTE — Progress Notes (Signed)
Pre visit review using our clinic review tool, if applicable. No additional management support is needed unless otherwise documented below in the visit note. 

## 2014-09-25 NOTE — Patient Instructions (Addendum)
Good to see you Ice is your friend Continue the vitamins Gabapentin at night Wear good shoes.  Stay active iron 81m elemental iron.  See me again in 2 months and if we need we can inject hip

## 2014-10-06 ENCOUNTER — Telehealth: Payer: Self-pay | Admitting: Internal Medicine

## 2014-10-06 NOTE — Telephone Encounter (Signed)
Pt scheduled 9.20.16

## 2014-10-06 NOTE — Telephone Encounter (Signed)
Pt called about her right thumb and to palm of hand is paining her. Pt has to come in tomorrow @9 :30a for her B12 and wants to know if she can be seen? Let me know where to sch.  Pt cell [phone]. Thank You!

## 2014-10-07 ENCOUNTER — Encounter: Payer: Self-pay | Admitting: Family Medicine

## 2014-10-07 ENCOUNTER — Ambulatory Visit (INDEPENDENT_AMBULATORY_CARE_PROVIDER_SITE_OTHER)
Admission: RE | Admit: 2014-10-07 | Discharge: 2014-10-07 | Disposition: A | Discharge status: Home / Self Care | Payer: Medicare Other | Source: Ambulatory Visit | Attending: Family Medicine | Admitting: Family Medicine

## 2014-10-07 ENCOUNTER — Ambulatory Visit: Payer: Medicare Other

## 2014-10-07 ENCOUNTER — Ambulatory Visit (INDEPENDENT_AMBULATORY_CARE_PROVIDER_SITE_OTHER): Payer: Medicare Other | Admitting: Family Medicine

## 2014-10-07 VITALS — BP 130/82 | HR 74 | Temp 98.8°F | Ht 61.75 in | Wt 226.0 lb

## 2014-10-07 DIAGNOSIS — Z23 Encounter for immunization: Secondary | ICD-10-CM

## 2014-10-07 DIAGNOSIS — M79644 Pain in right finger(s): Secondary | ICD-10-CM | POA: Diagnosis not present

## 2014-10-07 DIAGNOSIS — E538 Deficiency of other specified B group vitamins: Secondary | ICD-10-CM

## 2014-10-07 DIAGNOSIS — M19041 Primary osteoarthritis, right hand: Secondary | ICD-10-CM | POA: Diagnosis not present

## 2014-10-07 MED ORDER — CYANOCOBALAMIN 1000 MCG/ML IJ SOLN
1000.0000 ug | Freq: Once | INTRAMUSCULAR | Status: AC
  Administered 2014-10-07: 1000 ug via INTRAMUSCULAR

## 2014-10-07 NOTE — Progress Notes (Addendum)
Subjective:  Patient ID: Morgan Hurst, female    DOB: 11-Aug-1945  Age: 69 y.o. MRN: [id number]  CC: Right thumb   HPI:  69 year old female presents for an acute visit with complaints of right thumb pain.  Patient reports that her pain started earlier last week. She states that her right thumb is painful. No known inciting factor. No fall, trauma, injury. She states that it is worse with range of motion. She notes associated swelling of the thenar eminence. She reports that the pain is now radiating upward (wrist and forearm).  Additionally, she states that there is a jerk or pop when she extends her thumb.  She has been taking her regular medications of Mobic and tramadol with no significant relief.  Social Hx   Social History   Social History  . Marital Status: Married    Spouse Name: N/A  . Number of Children: N/A  . Years of Education: N/A   Social History Main Topics  . Smoking status: Never Smoker   . Smokeless tobacco: None  . Alcohol Use: No  . Drug Use: No  . Sexual Activity: Not Asked   Other Topics Concern  . None   Social History Narrative   Lives with husband in [place], dog outside.      Work - retired from [employer] - regular   Exercise - walks occasionally, limited by fatigue   Review of Systems  Constitutional: Negative.   Musculoskeletal:       Right thumb pain.   Objective:  BP 130/82 mmHg  Pulse 74  Temp(Src) 98.8 F (37.1 C) (Oral)  Ht 5' 1.75" (1.568 m)  Wt 226 lb (102.513 kg)  BMI 41.70 kg/m2  SpO2 96%  BP/Weight 10/07/2014 09/25/2014 08/30/4816  Systolic BP 563 149 702  Diastolic BP 82 84 82  Wt. (Lbs) 226 224 224  BMI 41.7 41.33 41.33   Physical Exam  Constitutional: She is oriented to person, place, and time. She appears well-developed and well-nourished. No distress.  Musculoskeletal:  Right hand - Thumb with decreased ROM in all planes secondary to pain. Mild swelling of the thenar eminence. Tenderness  at mcp joint.   Neurological: She is alert and oriented to person, place, and time.  Skin: Skin is warm and dry. No rash noted.  Psychiatric: She has a normal mood and affect.   Lab Results  Component Value Date   WBC 8.3 02/14/2014   HGB 14.9 02/14/2014   HCT 44.1 02/14/2014   PLT 196.0 02/14/2014   GLUCOSE 80 08/05/2014   CHOL 202* 02/14/2014   TRIG 128.0 02/14/2014   HDL 52.20 02/14/2014   LDLDIRECT 177.7 12/25/2012   LDLCALC 124* 02/14/2014   ALT 22 08/05/2014   AST 21 08/05/2014   NA 138 08/05/2014   K 3.7 08/05/2014   CL 102 08/05/2014   CREATININE 0.75 08/05/2014   BUN 18 08/05/2014   CO2 28 08/05/2014   TSH 2.73 02/14/2014   HGBA1C 6.0 02/14/2014   MICROALBUR 1.6 02/14/2014   Assessment & Plan:   Problem List Items Addressed This Visit    Pain of right thumb    Unclear etiology. Obtaining xray to rule out underlying fracture and to assess for OA.  Advised continued Mobic. Advised increase in Tramadol to 100 mg Q8 PRN. May need to see hand surgery if unable to get in with SM.       Other Visit Diagnoses  Thumb pain, right    -  Primary    Relevant Orders    DG Finger Thumb Right (Completed)    B12 deficiency        Relevant Medications    cyanocobalamin ((VITAMIN B-12)) injection 1,000 mcg (Completed)    Encounter for immunization          Follow-up: Followup with Dr. [name] in the next few weeks.  Thersa Salt, DO

## 2014-10-07 NOTE — Patient Instructions (Signed)
We will call with your xray results.  Continue your mobic and tramadol. Increase the tramadol to 100 mg every 8 hours as needed.  Follow up with Dr. Gilford Rile  Take care  Dr. Lacinda Axon

## 2014-10-07 NOTE — Progress Notes (Signed)
Pre visit review using our clinic review tool, if applicable. No additional management support is needed unless otherwise documented below in the visit note. 

## 2014-10-07 NOTE — Assessment & Plan Note (Addendum)
Unclear etiology. Obtaining xray to rule out underlying fracture and to assess for OA.  Advised continued Mobic. Advised increase in Tramadol to 100 mg Q8 PRN. May need to see hand surgery if unable to get in with SM.

## 2014-10-09 ENCOUNTER — Ambulatory Visit (INDEPENDENT_AMBULATORY_CARE_PROVIDER_SITE_OTHER): Payer: Medicare Other | Admitting: Family Medicine

## 2014-10-09 ENCOUNTER — Encounter: Payer: Self-pay | Admitting: Family Medicine

## 2014-10-09 ENCOUNTER — Other Ambulatory Visit (INDEPENDENT_AMBULATORY_CARE_PROVIDER_SITE_OTHER): Payer: Medicare Other

## 2014-10-09 VITALS — BP 120/80 | HR 83 | Ht 61.75 in | Wt 226.0 lb

## 2014-10-09 DIAGNOSIS — M25531 Pain in right wrist: Secondary | ICD-10-CM | POA: Diagnosis not present

## 2014-10-09 DIAGNOSIS — M79646 Pain in unspecified finger(s): Secondary | ICD-10-CM | POA: Diagnosis not present

## 2014-10-09 DIAGNOSIS — M79644 Pain in right finger(s): Secondary | ICD-10-CM

## 2014-10-09 NOTE — Patient Instructions (Addendum)
I am so sorry you are falling apart ;) Wear brace day and night for 2 weeks Ice 20 minutes 2 times daily. Usually after activity and before bed. Medicine 2 times a day for 5 days. If it is gout you will get better very quickly.  We will get xray in 2 weeks and see you then as well.

## 2014-10-09 NOTE — Assessment & Plan Note (Signed)
Patient's right thumb pain differential is quite broad. This could be an exacerbation of underlying arthritis versus the possible gout with how sensitive patient is taken light sensation. Some signs of de Quervain's tenosynovitis as well as there is a potential for scaphoid occult fracture. At this time looking at patient's x-rays which were reviewed by me today and showing no significant bony abnormality except for moderate arthritis he would be safe for patient to wear a brace today and night for the next 2 weeks. We discussed icing regimen. We discussed avoiding any significant lifting at this time. Patient was to make these different changes and come back in 2 weeks. At that time we would also like to get an x-ray to further evaluate the scaphoid to make sure patient is healing appropriately. Patient even a trial of some antigout medication and if this helps within the next 48 hours than likely this would be our working diagnosis.  Spent  25 minutes with patient face-to-face and had greater than 50% of counseling including as described above in assessment and plan.

## 2014-10-09 NOTE — Progress Notes (Signed)
[facility] [address] [place], [place] [address] Phone: [phone] Subjective:    I'm seeing this patient by the request  of:  Rica Mast, MD   CC: new problem finger swelling.   [id number] Morgan Hurst is a 69 y.o. female coming in with complaint of   Finger swelling. Patient states happen around her right thumb. She was at the beach. Does not rib or any true injury but was taking care of her grandchild. Patient states that the pain seems to be mostly around the thumb joints proximally. Denies any numbness. States any type of sensation seems to be painful. Had significant swelling initially and seems to be better at this time. Denies any numbness associated with it. Even light sensation was hurting him. Denies any bug bite.       Past Medical History  Diagnosis Date  . Arthritis   . Endometriosis    Past Surgical History  Procedure Laterality Date  . Abdominal hysterectomy  age 51  . Ankle surgery    . Vaginal delivery  3   Social History  Substance Use Topics  . Smoking status: Never Smoker   . Smokeless tobacco: Not on file  . Alcohol Use: No   No Known Allergies Family History  Problem Relation Age of Onset  . Diabetes Mother   . Cancer Mother     colon and breast  . Cancer Father     Leukemia  . Cancer Brother     colon and lung  . Cholelithiasis Daughter   . Heart disease Paternal Grandfather   . Cancer Other 29    colon       Past medical history, social, surgical and family history all reviewed in electronic medical record.   Review of Systems: No headache, visual changes, nausea, vomiting, diarrhea, constipation, dizziness, abdominal pain, skin rash, fevers, chills, night sweats, weight loss, swollen lymph nodes, body aches, joint swelling, muscle aches, chest pain, shortness of breath, mood changes.   Objective There were no vitals taken for this visit. There is no weight on file to calculate  BMI.   General: No apparent distress alert and oriented x3 mood and affect normal, dressed appropriately. Obese HEENT: Pupils equal, extraocular movements intact  Respiratory: Patient's speak in full sentences and does not appear short of breath  Cardiovascular: No lower extremity edema, non tender, no erythema  Skin: Warm dry intact with no signs of infection or rash on extremities or on axial skeleton.  Abdomen: Soft nontender  Neuro: Cranial nerves II through XII are intact, neurovascularly intact in all extremities with 2+ DTRs and 2+ pulses.  Lymph: No lymphadenopathy of posterior or anterior cervical chain or axillae bilaterally.  Gait mild antalgic gait  MSK:  Non tender with full range of motion and good stability and symmetric strength and tone of shoulders, elbows, wrist, knee and ankles bilaterally. Mild to moderate osteophytic changes of multiple joints  right-hand exam shows the patient is very tender to palpation over the [facility] joints as well as even to light sensation in the surrounding area. No signs of infection. Patient has pain with Finkelstein's also severe pain in the snuffbox. Neurovascularly intact distally with good capillary refill. Once again patient is even tender to palpation and light sensation.  MSK US performed [id number] hand This study was ordered, performed, and interpreted by Charlann Boxer D.O.  hand All extensor compartments visualized and tendons all normal with mild hypoechoic changes within the tendon  sheath of the abductor pollicis longus TFCC intact. Scapholunate ligament intact.patient does have significant degenerative changes and questionable increasing hypoechoic changes over the scaphoid bone but no cortical defect Moderate CMC arthritis Carpal tunnel visualized and median nerve area normal, flexor tendons all normal in appearance without fraying, tears, or sheath effusions. Power doppler signal normal.  IMPRESSION:  Moderate CMC arthritis with  questionable scaphoid injury.     Impression and Recommendations:     This case required medical decision making of moderate complexity.

## 2014-10-09 NOTE — Progress Notes (Signed)
Pre visit review using our clinic review tool, if applicable. No additional management support is needed unless otherwise documented below in the visit note. 

## 2014-10-23 ENCOUNTER — Ambulatory Visit (INDEPENDENT_AMBULATORY_CARE_PROVIDER_SITE_OTHER): Payer: Medicare Other | Admitting: Family Medicine

## 2014-10-23 ENCOUNTER — Encounter: Payer: Self-pay | Admitting: Family Medicine

## 2014-10-23 ENCOUNTER — Other Ambulatory Visit (INDEPENDENT_AMBULATORY_CARE_PROVIDER_SITE_OTHER): Payer: Medicare Other

## 2014-10-23 VITALS — BP 120/82 | HR 83 | Ht 61.75 in | Wt 226.0 lb

## 2014-10-23 DIAGNOSIS — M79641 Pain in right hand: Secondary | ICD-10-CM

## 2014-10-23 DIAGNOSIS — M65311 Trigger thumb, right thumb: Secondary | ICD-10-CM | POA: Diagnosis not present

## 2014-10-23 DIAGNOSIS — M65319 Trigger thumb, unspecified thumb: Secondary | ICD-10-CM | POA: Insufficient documentation

## 2014-10-23 NOTE — Patient Instructions (Addendum)
Great to see you Ice is still going to help Will take some more time to heal.  Give it 2 weeks OK to put it in warm water See me again in 3-4 weeks.

## 2014-10-23 NOTE — Progress Notes (Signed)
[facility] [address] [place], [place] [address] Phone: [phone] Subjective:    I'm seeing this patient by the request  of:  Rica Mast, MD   CC: Thumb swelling and pain  [id number] Morgan Hurst is a 69 y.o. female coming in with complaint of   Finger swelling. Patient states that she did have the swelling previously. Seeing that patient did have [place] arthritis as well as a questionable scaphoid injury. Patient was put in a mobilizing brace for the last 3 weeks. Patient was to ice it and take medication sparingly. Patient states the wrist is feeling much better as long she is more in the brace but unfortunately continues to have thumb pain. States that it gets stuck in a flexed position a lot. Patient can do certain things like pushing with her thumb secondary to the pain. Denies any numbness or tingling. States that the swelling is significantly better.      Past Medical History  Diagnosis Date  . Arthritis   . Endometriosis    Past Surgical History  Procedure Laterality Date  . Abdominal hysterectomy  age 69  . Ankle surgery    . Vaginal delivery  2   Social History  Substance Use Topics  . Smoking status: Never Smoker   . Smokeless tobacco: Not on file  . Alcohol Use: No   No Known Allergies Family History  Problem Relation Age of Onset  . Diabetes Mother   . Cancer Mother     colon and breast  . Cancer Father     Leukemia  . Cancer Brother     colon and lung  . Cholelithiasis Daughter   . Heart disease Paternal Grandfather   . Cancer Other 29    colon       Past medical history, social, surgical and family history all reviewed in electronic medical record.   Review of Systems: No headache, visual changes, nausea, vomiting, diarrhea, constipation, dizziness, abdominal pain, skin rash, fevers, chills, night sweats, weight loss, swollen lymph nodes, body aches, joint swelling, muscle aches, chest pain,  shortness of breath, mood changes.   Objective There were no vitals taken for this visit. There is no weight on file to calculate BMI.   General: No apparent distress alert and oriented x3 mood and affect normal, dressed appropriately. Obese HEENT: Pupils equal, extraocular movements intact  Respiratory: Patient's speak in full sentences and does not appear short of breath  Cardiovascular: No lower extremity edema, non tender, no erythema  Skin: Warm dry intact with no signs of infection or rash on extremities or on axial skeleton.  Abdomen: Soft nontender  Neuro: Cranial nerves II through XII are intact, neurovascularly intact in all extremities with 2+ DTRs and 2+ pulses.  Lymph: No lymphadenopathy of posterior or anterior cervical chain or axillae bilaterally.  Gait mild antalgic gait  MSK:  Non tender with full range of motion and good stability and symmetric strength and tone of shoulders, elbows, wrist, knee and ankles bilaterally. Mild to moderate osteophytic changes of multiple joints  right-hand exam shows the patient is very tender to palpation over the flexor tendon of the thumb and does have a trigger nodule noted. Patient still moderately tender over the [facility] joint as well. No tenderness over the anatomical snuffbox.  MSK US performed OH:[id number] hand This study was ordered, performed, and interpreted by Charlann Boxer D.O.  hand  Scapholunate ligament intact.patient does have significant degenerative changes but no  defect noted of the scaphoid bone. Hypoechoic changes and was seen previously has resolved. Moderate CMC arthritis Carpal tunnel visualized and median nerve area normal, flexor tendons all normal in appearance without fraying, tears, or sheath effusions. Power doppler signal normal. Patient does have significant tendon sheath effusion of the abductor pollicis longus as well as the flexor tendon of the thumb.  IMPRESSION:  Moderate CMC arthritis and trigger finger of  thumb  Procedure: Real-time Ultrasound Guided Injection of flexor tendon sheath of right thumb Device: GE Logiq E  Ultrasound guided injection is preferred based studies that show increased duration, increased effect, greater accuracy, decreased procedural pain, increased response rate, and decreased cost with ultrasound guided versus blind injection.  Verbal informed consent obtained.  Time-out conducted.  Noted no overlying erythema, induration, or other signs of local infection.  Skin prepped in a sterile fashion.  Local anesthesia: Topical Ethyl chloride.  With sterile technique and under real time ultrasound guidance:  With a 25-gauge 1 and shewas injected with a total of 0.5 mL of 0.5% Marcaine and 0.5 mL of Kenalog 40 mg/dL within the tendon sheath. Completed without difficulty  Pain immediately resolved suggesting accurate placement of the medication.  Advised to call if fevers/chills, erythema, induration, drainage, or persistent bleeding.  Images permanently stored and available for review in the ultrasound unit.  Impression: Technically successful ultrasound guided injection.   Impression and Recommendations:     This case required medical decision making of moderate complexity.

## 2014-10-23 NOTE — Progress Notes (Signed)
Pre visit review using our clinic review tool, if applicable. No additional management support is needed unless otherwise documented below in the visit note. 

## 2014-10-23 NOTE — Assessment & Plan Note (Signed)
Patient given injection today. Tolerated procedure very well. We discussed icing regimen. Patient well not need to do any splinting. I expect patient to do very well but does have some underlying arthritis that can also be contributing. Patient follow-up again in 3-4 weeks for further evaluation and treatment.

## 2014-10-29 ENCOUNTER — Other Ambulatory Visit: Payer: Self-pay | Admitting: Internal Medicine

## 2014-11-05 ENCOUNTER — Ambulatory Visit: Payer: Medicare Other | Admitting: Internal Medicine

## 2014-11-15 DIAGNOSIS — S46919A Strain of unspecified muscle, fascia and tendon at shoulder and upper arm level, unspecified arm, initial encounter: Secondary | ICD-10-CM | POA: Diagnosis not present

## 2014-11-20 ENCOUNTER — Ambulatory Visit (INDEPENDENT_AMBULATORY_CARE_PROVIDER_SITE_OTHER): Payer: Medicare Other | Admitting: Family Medicine

## 2014-11-20 ENCOUNTER — Encounter: Payer: Self-pay | Admitting: Family Medicine

## 2014-11-20 VITALS — BP 130/84 | HR 81 | Ht 61.75 in | Wt 225.0 lb

## 2014-11-20 DIAGNOSIS — M999 Biomechanical lesion, unspecified: Secondary | ICD-10-CM

## 2014-11-20 DIAGNOSIS — M65311 Trigger thumb, right thumb: Secondary | ICD-10-CM | POA: Diagnosis not present

## 2014-11-20 DIAGNOSIS — M9902 Segmental and somatic dysfunction of thoracic region: Secondary | ICD-10-CM | POA: Diagnosis not present

## 2014-11-20 DIAGNOSIS — M62838 Other muscle spasm: Secondary | ICD-10-CM

## 2014-11-20 DIAGNOSIS — M6248 Contracture of muscle, other site: Secondary | ICD-10-CM | POA: Diagnosis not present

## 2014-11-20 NOTE — Assessment & Plan Note (Signed)
Seems healed at this time. Patient may need repeat injections in the future. Patient continued to have pain any point patient could also tolerate his CMC joint injection.

## 2014-11-20 NOTE — Progress Notes (Signed)
[facility] [address] [place], [place] [address] Phone: [phone] Subjective:     CC: Thumb swelling and pain f/u   [id number] Morgan Hurst is a 69 y.o. female coming in with complaint of   Finger swelling. Patient was seen previously and did have significant pain in the thumb. Patient did have a trigger thumb and was given an injection. States that it took about 2 weeks but now is no longer giving her any pain. Minimally triggering noted. States that the joint still hurts a little bit but nothing as severe as it was previously. Able to do daily activities. Has made some adjustments and how she holds things such as her lawnmower.  Patient is complaining of a new problem. Patient workup and did have some neck discomfort. States that it seemed tight on the left side. Was unable to move it. Went to urgent care and did not have any significant findings. X-rays were taken and were told that there are unremarkable. Patient was to do some icing, muscle relaxer and anti-inflammatories. Still is having tightness. Going on 7 days. No radiation down the arm and no numbness or tingling or weakness. Can be uncomfortable enough to wake her up at night.   Past Medical History  Diagnosis Date  . Arthritis   . Endometriosis    Past Surgical History  Procedure Laterality Date  . Abdominal hysterectomy  age 54  . Ankle surgery    . Vaginal delivery  2   Social History  Substance Use Topics  . Smoking status: Never Smoker   . Smokeless tobacco: None  . Alcohol Use: No   No Known Allergies Family History  Problem Relation Age of Onset  . Diabetes Mother   . Cancer Mother     colon and breast  . Cancer Father     Leukemia  . Cancer Brother     colon and lung  . Cholelithiasis Daughter   . Heart disease Paternal Grandfather   . Cancer Other 29    colon       Past medical history, social, surgical and family history all reviewed in electronic  medical record.   Review of Systems: No headache, visual changes, nausea, vomiting, diarrhea, constipation, dizziness, abdominal pain, skin rash, fevers, chills, night sweats, weight loss, swollen lymph nodes, body aches, joint swelling, muscle aches, chest pain, shortness of breath, mood changes.   Objective Blood pressure 130/84, pulse 81, height 5' 1.75" (1.568 m), weight 225 lb (102.059 kg), SpO2 97 %. Body mass index is 41.51 kg/(m^2).   General: No apparent distress alert and oriented x3 mood and affect normal, dressed appropriately. Obese HEENT: Pupils equal, extraocular movements intact  Respiratory: Patient's speak in full sentences and does not appear short of breath  Cardiovascular: No lower extremity edema, non tender, no erythema  Skin: Warm dry intact with no signs of infection or rash on extremities or on axial skeleton.  Abdomen: Soft nontender  Neuro: Cranial nerves II through XII are intact, neurovascularly intact in all extremities with 2+ DTRs and 2+ pulses.  Lymph: No lymphadenopathy of posterior or anterior cervical chain or axillae bilaterally.  Gait mild antalgic gait  MSK:  Non tender with full range of motion and good stability and symmetric strength and tone of shoulders, elbows, wrist, knee and ankles bilaterally. Mild to moderate osteophytic changes of multiple joints  right-hand exam shows the patient is still moderately tender over the area of injection but significantly  increased range of motion. Feels that normal at this point. Still has a positive grind test.  Neck: Inspection unremarkable. No palpable stepoffs. Negative Spurling's maneuver. Limitation in the last 10 of extension and right-sided side bending and rotation. Tightness of the right trapezius. Grip strength and sensation normal in bilateral hands Strength good C4 to T1 distribution No sensory change to C4 to T1 Negative Hoffman sign bilaterally Reflexes normal  Osteopathic findings T1  extended rotated and side bent right with inhaled first rib   Impression and Recommendations:     This case required medical decision making of moderate complexity.

## 2014-11-20 NOTE — Assessment & Plan Note (Signed)
Decision today to treat with OMT was based on Physical Exam  After verbal consent patient was treated with ME, FPR, ST techniques in thoracic and rib areas  Patient tolerated the procedure well with improvement in symptoms  Patient given exercises, stretches and lifestyle modifications  See medications in patient instructions if given  Patient will follow up in 4-6 weeks

## 2014-11-20 NOTE — Progress Notes (Signed)
Pre visit review using our clinic review tool, if applicable. No additional management support is needed unless otherwise documented below in the visit note. 

## 2014-11-20 NOTE — Assessment & Plan Note (Signed)
Patient did respond well to the manipulation today. Encourage patient to work on posture, topical anti-phlegm towards given, icing pedicle. Patient had increasing range of motion of the neck immediately. I do feel that there is some underlying arthritic changes that we will need to consider in the future we do not have any significant improvement but no further imaging is needed until radicular symptoms or weakness occurs. He is to respond well to conservative therapy. Return to office in 4-6 weeks

## 2014-11-20 NOTE — Patient Instructions (Signed)
Good to see you I am glad the thumb is better Ice 2-3 times a day pennsaid pinkie amount topically 2 times daily as needed.  See me again when you need me.

## 2014-11-21 ENCOUNTER — Ambulatory Visit: Payer: Medicare Other | Admitting: Internal Medicine

## 2014-11-25 ENCOUNTER — Ambulatory Visit: Payer: Medicare Other | Admitting: Family Medicine

## 2014-11-27 ENCOUNTER — Ambulatory Visit (INDEPENDENT_AMBULATORY_CARE_PROVIDER_SITE_OTHER): Payer: Medicare Other | Admitting: Internal Medicine

## 2014-11-27 ENCOUNTER — Encounter: Payer: Self-pay | Admitting: Internal Medicine

## 2014-11-27 ENCOUNTER — Telehealth: Payer: Self-pay | Admitting: *Deleted

## 2014-11-27 VITALS — BP 132/81 | HR 78 | Temp 97.5°F | Ht 62.0 in | Wt 230.4 lb

## 2014-11-27 DIAGNOSIS — IMO0001 Reserved for inherently not codable concepts without codable children: Secondary | ICD-10-CM

## 2014-11-27 DIAGNOSIS — E538 Deficiency of other specified B group vitamins: Secondary | ICD-10-CM | POA: Diagnosis not present

## 2014-11-27 DIAGNOSIS — M791 Myalgia: Secondary | ICD-10-CM | POA: Diagnosis not present

## 2014-11-27 DIAGNOSIS — M609 Myositis, unspecified: Secondary | ICD-10-CM

## 2014-11-27 DIAGNOSIS — M25551 Pain in right hip: Secondary | ICD-10-CM

## 2014-11-27 DIAGNOSIS — M6248 Contracture of muscle, other site: Secondary | ICD-10-CM

## 2014-11-27 DIAGNOSIS — R413 Other amnesia: Secondary | ICD-10-CM

## 2014-11-27 DIAGNOSIS — M62838 Other muscle spasm: Secondary | ICD-10-CM

## 2014-11-27 DIAGNOSIS — E785 Hyperlipidemia, unspecified: Secondary | ICD-10-CM | POA: Diagnosis not present

## 2014-11-27 LAB — CBC WITH DIFFERENTIAL/PLATELET
BASOS PCT: 0.3 % (ref 0.0–3.0)
Basophils Absolute: 0.1 10*3/uL (ref 0.0–0.1)
EOS ABS: 0.1 10*3/uL (ref 0.0–0.7)
Eosinophils Relative: 0.3 % (ref 0.0–5.0)
HEMATOCRIT: 44.4 % (ref 36.0–46.0)
Hemoglobin: 14.6 g/dL (ref 12.0–15.0)
LYMPHS ABS: 3.5 10*3/uL (ref 0.7–4.0)
LYMPHS PCT: 15.9 % (ref 12.0–46.0)
MCHC: 32.9 g/dL (ref 30.0–36.0)
MCV: 88.3 fl (ref 78.0–100.0)
Monocytes Absolute: 0.9 10*3/uL (ref 0.1–1.0)
Monocytes Relative: 4.1 % (ref 3.0–12.0)
NEUTROS ABS: 17.7 10*3/uL — AB (ref 1.4–7.7)
Neutrophils Relative %: 79.4 % — ABNORMAL HIGH (ref 43.0–77.0)
PLATELETS: 226 10*3/uL (ref 150.0–400.0)
RBC: 5.03 Mil/uL (ref 3.87–5.11)
RDW: 14.3 % (ref 11.5–15.5)
WBC: 22.3 10*3/uL (ref 4.0–10.5)

## 2014-11-27 LAB — HEMOGLOBIN A1C: HEMOGLOBIN A1C: 6 % (ref 4.6–6.5)

## 2014-11-27 LAB — COMPREHENSIVE METABOLIC PANEL
ALT: 20 U/L (ref 0–35)
AST: 20 U/L (ref 0–37)
Albumin: 3.8 g/dL (ref 3.5–5.2)
Alkaline Phosphatase: 78 U/L (ref 39–117)
BUN: 18 mg/dL (ref 6–23)
CALCIUM: 9.5 mg/dL (ref 8.4–10.5)
CHLORIDE: 106 meq/L (ref 96–112)
CO2: 24 meq/L (ref 19–32)
CREATININE: 0.64 mg/dL (ref 0.40–1.20)
GFR: 97.94 mL/min (ref >60.00)
GLUCOSE: 102 mg/dL — AB (ref 70–99)
Potassium: 3.7 mEq/L (ref 3.5–5.1)
SODIUM: 141 meq/L (ref 135–145)
Total Bilirubin: 0.4 mg/dL (ref 0.2–1.2)
Total Protein: 7.2 g/dL (ref 6.0–8.3)

## 2014-11-27 LAB — VITAMIN B12: VITAMIN B 12: 255 pg/mL (ref 211–911)

## 2014-11-27 LAB — TSH: TSH: 2.05 u[IU]/mL (ref 0.35–4.50)

## 2014-11-27 MED ORDER — CYANOCOBALAMIN 1000 MCG/ML IJ SOLN
1000.0000 ug | Freq: Once | INTRAMUSCULAR | Status: AC
  Administered 2014-11-27: 1000 ug via INTRAMUSCULAR

## 2014-11-27 NOTE — Assessment & Plan Note (Signed)
Diffuse myalgia, certainly worsened by morbid obesity. Encouraged nutrition evaluation which she declines. Will set up PT to help with neck and hip pain.

## 2014-11-27 NOTE — Progress Notes (Signed)
Pre visit review using our clinic review tool, if applicable. No additional management support is needed unless otherwise documented below in the visit note. 

## 2014-11-27 NOTE — Telephone Encounter (Signed)
CRITICAL   WBC 22.3

## 2014-11-27 NOTE — Assessment & Plan Note (Signed)
Chronic right hip pain secondary to OA. Encouraged non-weight bearing exercise such as water activity. Continue Naprosyn prn.

## 2014-11-27 NOTE — Progress Notes (Signed)
Subjective:    Patient ID: Morgan Hurst, female    DOB: 05/13/1945, 69 y.o.   MRN: [id number]  HPI  69YO female presents for follow up.  Recently seen for right thumb pain. Symptoms improved after steroid injection. Also recently seen at urgent care for Pleurisy. Started on Naprosyn and Prednisone and muscle relaxer. Then, had adjustment with Dr. [name]. Symptoms have improved. Continues to take Naprosyn.  Told that she may need hip replacement. Sedentary. Not following any diet. Would like to consider medication for weight loss.   Wt Readings from Last 3 Encounters:  11/27/14 230 lb 6 oz (104.497 kg)  11/20/14 225 lb (102.059 kg)  10/23/14 226 lb (102.513 kg)   BP Readings from Last 3 Encounters:  11/27/14 132/81  11/20/14 130/84  10/23/14 120/82    Past Medical History  Diagnosis Date  . Arthritis   . Endometriosis    Family History  Problem Relation Age of Onset  . Diabetes Mother   . Cancer Mother     colon and breast  . Cancer Father     Leukemia  . Cancer Brother     colon and lung  . Cholelithiasis Daughter   . Heart disease Paternal Grandfather   . Cancer Other 29    colon   Past Surgical History  Procedure Laterality Date  . Abdominal hysterectomy  age 47  . Ankle surgery    . Vaginal delivery  2   Social History   Social History  . Marital Status: Married    Spouse Name: N/A  . Number of Children: N/A  . Years of Education: N/A   Social History Main Topics  . Smoking status: Never Smoker   . Smokeless tobacco: None  . Alcohol Use: No  . Drug Use: No  . Sexual Activity: Not Asked   Other Topics Concern  . None   Social History Narrative   Lives with husband in [place], dog outside.      Work - retired from [employer] - regular   Exercise - walks occasionally, limited by fatigue    Review of Systems  Constitutional: Negative for fever, chills, appetite change, fatigue and unexpected weight change.    Eyes: Negative for visual disturbance.  Respiratory: Negative for shortness of breath.   Cardiovascular: Negative for chest pain and leg swelling.  Gastrointestinal: Negative for nausea, vomiting, abdominal pain, diarrhea and constipation.  Musculoskeletal: Positive for myalgias, back pain, arthralgias, gait problem and neck pain. Negative for neck stiffness.  Skin: Negative for color change and rash.  Neurological: Negative for weakness.  Hematological: Negative for adenopathy. Does not bruise/bleed easily.  Psychiatric/Behavioral: Negative for suicidal ideas, sleep disturbance and dysphoric mood. The patient is not nervous/anxious.        Objective:    BP 132/81 mmHg  Pulse 78  Temp(Src) 97.5 F (36.4 C) (Oral)  Ht 5' 2"  (1.575 m)  Wt 230 lb 6 oz (104.497 kg)  BMI 42.13 kg/m2  SpO2 97% Physical Exam  Constitutional: She is oriented to person, place, and time. She appears well-developed and well-nourished. No distress.  HENT:  Head: Normocephalic and atraumatic.  Right Ear: External ear normal.  Left Ear: External ear normal.  Nose: Nose normal.  Mouth/Throat: Oropharynx is clear and moist. No oropharyngeal exudate.  Eyes: Conjunctivae are normal. Pupils are equal, round, and reactive to light. Right eye exhibits no discharge. Left eye exhibits no discharge. No scleral icterus.  Neck: Normal range of motion. Neck supple. No tracheal deviation present. No thyromegaly present.  Cardiovascular: Normal rate, regular rhythm, normal heart sounds and intact distal pulses.  Exam reveals no gallop and no friction rub.   No murmur heard. Pulmonary/Chest: Effort normal and breath sounds normal. No respiratory distress. She has no wheezes. She has no rales. She exhibits no tenderness.  Musculoskeletal: Normal range of motion. She exhibits no edema.       Cervical back: She exhibits tenderness, pain and spasm.       Back:  Lymphadenopathy:    She has no cervical adenopathy.   Neurological: She is alert and oriented to person, place, and time. No cranial nerve deficit. She exhibits normal muscle tone. Coordination normal.  Skin: Skin is warm and dry. No rash noted. She is not diaphoretic. No erythema. No pallor.  Psychiatric: She has a normal mood and affect. Her behavior is normal. Judgment and thought content normal.          Assessment & Plan:   Problem List Items Addressed This Visit      Unprioritized   Myalgia and myositis    Diffuse myalgia, certainly worsened by morbid obesity. Encouraged nutrition evaluation which she declines. Will set up PT to help with neck and hip pain.      Right hip pain    Chronic right hip pain secondary to OA. Encouraged non-weight bearing exercise such as water activity. Continue Naprosyn prn.      Relevant Orders   Ambulatory referral to Physical Therapy   Severe obesity (BMI >= 40) (HCC)    Wt Readings from Last 3 Encounters:  11/27/14 230 lb 6 oz (104.497 kg)  11/20/14 225 lb (102.059 kg)  10/23/14 226 lb (102.513 kg)   Pilar Plate discussion today about morbid obesity and need for weight loss. Recommended evaluation with nutritionist. She declines. Encouraged physical activity such as water activity. Discussed several options for appetite suppressants, however these are too expensive for her right now.       Relevant Orders   Hemoglobin A1c   Comprehensive metabolic panel   CBC with Differential/Platelet   Trapezius muscle spasm - Primary    Encouraged her to continue Naprosyn and prn Zanaflex. Follow up with Dr. [name] as scheduled.      Relevant Orders   TSH   B12       Return in about 3 months (around 02/27/2015) for Recheck.

## 2014-11-27 NOTE — Assessment & Plan Note (Signed)
Encouraged her to continue Naprosyn and prn Zanaflex. Follow up with Dr. [name] as scheduled.

## 2014-11-27 NOTE — Addendum Note (Signed)
Addended by: Vernetta Honey on: 11/27/2014 09:50 AM   Modules accepted: Orders

## 2014-11-27 NOTE — Assessment & Plan Note (Signed)
Wt Readings from Last 3 Encounters:  11/27/14 230 lb 6 oz (104.497 kg)  11/20/14 225 lb (102.059 kg)  10/23/14 226 lb (102.513 kg)   Pilar Plate discussion today about morbid obesity and need for weight loss. Recommended evaluation with nutritionist. She declines. Encouraged physical activity such as water activity. Discussed several options for appetite suppressants, however these are too expensive for her right now.

## 2014-11-27 NOTE — Patient Instructions (Addendum)
We will check labs today.  Consider starting water exercise at the [facility].   We will set up physical therapy to help with pain symptoms and strengthening.

## 2014-11-28 ENCOUNTER — Encounter: Payer: Self-pay | Admitting: *Deleted

## 2014-12-03 ENCOUNTER — Telehealth: Payer: Self-pay

## 2014-12-03 NOTE — Telephone Encounter (Signed)
Patient called the triage line, left message that julie was trying to call her.  Lab results are available in the chart.  Attempted to call patient back, left message to return my call at my extension.

## 2014-12-03 NOTE — Telephone Encounter (Signed)
Patient returned my call, reviewed labs with her, and she has a lab appt. scheduled on the 22nd already for repeat CBC

## 2014-12-08 ENCOUNTER — Ambulatory Visit (INDEPENDENT_AMBULATORY_CARE_PROVIDER_SITE_OTHER): Payer: Medicare Other | Admitting: Family Medicine

## 2014-12-08 ENCOUNTER — Ambulatory Visit (INDEPENDENT_AMBULATORY_CARE_PROVIDER_SITE_OTHER)
Admission: RE | Admit: 2014-12-08 | Discharge: 2014-12-08 | Disposition: A | Discharge status: Home / Self Care | Payer: Medicare Other | Source: Ambulatory Visit | Attending: Family Medicine | Admitting: Family Medicine

## 2014-12-08 ENCOUNTER — Encounter: Payer: Self-pay | Admitting: Family Medicine

## 2014-12-08 ENCOUNTER — Other Ambulatory Visit (INDEPENDENT_AMBULATORY_CARE_PROVIDER_SITE_OTHER): Payer: Medicare Other

## 2014-12-08 VITALS — BP 130/82 | HR 76 | Ht 62.0 in | Wt 232.0 lb

## 2014-12-08 DIAGNOSIS — M542 Cervicalgia: Secondary | ICD-10-CM | POA: Diagnosis not present

## 2014-12-08 DIAGNOSIS — M5010 Cervical disc disorder with radiculopathy, unspecified cervical region: Secondary | ICD-10-CM | POA: Diagnosis not present

## 2014-12-08 DIAGNOSIS — M25511 Pain in right shoulder: Secondary | ICD-10-CM

## 2014-12-08 DIAGNOSIS — M50322 Other cervical disc degeneration at C5-C6 level: Secondary | ICD-10-CM | POA: Diagnosis not present

## 2014-12-08 MED ORDER — OXYCODONE-ACETAMINOPHEN 5-325 MG PO TABS: 1.0000 | ORAL_TABLET | Freq: Three times a day (TID) | ORAL | Status: DC | PRN

## 2014-12-08 NOTE — Progress Notes (Signed)
[facility] [address] [place], [place] [address] Phone: [phone] Subjective:     CC: Neck pain follow-up  Morgan Hurst Morgan Hurst is a 69 y.o. female coming in with complaint of neck pain  Patient was having worsening neck pain. Patient was seen previously. Was to start some exercises. States that there is more of a spasm in her shoulder area. Patient's primary care provider. Given naproxen as well as a muscle relaxer. Patient states she is having severe neck pain. Seems to be going to her trapezius as well as the right shoulder. Going down her arm and his notices and significant weakness. Patient states sometimes she has more of a spasming of the arm. No numbness. Paresis severity of pain a 9 out of 10. Not responding to the tramadol, muscle relaxer, gabapentin and anti-inflammatories. Affecting her daily activities. Having difficulty even dressing herself at this time. Does wake her up at night. Does not remember any type of trauma.  Past Medical History  Diagnosis Date  . Arthritis   . Endometriosis    Past Surgical History  Procedure Laterality Date  . Abdominal hysterectomy  age 56  . Ankle surgery    . Vaginal delivery  2   Social History  Substance Use Topics  . Smoking status: Never Smoker   . Smokeless tobacco: Not on file  . Alcohol Use: No   No Known Allergies Family History  Problem Relation Age of Onset  . Diabetes Mother   . Cancer Mother     colon and breast  . Cancer Father     Leukemia  . Cancer Brother     colon and lung  . Cholelithiasis Daughter   . Heart disease Paternal Grandfather   . Cancer Other 29    colon       Past medical history, social, surgical and family history all reviewed in electronic medical record.   Review of Systems: No headache, visual changes, nausea, vomiting, diarrhea, constipation, dizziness, abdominal pain, skin rash, fevers, chills, night sweats, weight loss, swollen lymph  nodes, body aches, joint swelling, muscle aches, chest pain, shortness of breath, mood changes.   Objective There were no vitals taken for this visit. There is no weight on file to calculate BMI.   General: No apparent distress alert and oriented x3 mood and affect normal, dressed appropriately. Obese HEENT: Pupils equal, extraocular movements intact  Respiratory: Patient's speak in full sentences and does not appear short of breath  Cardiovascular: No lower extremity edema, non tender, no erythema  Skin: Warm dry intact with no signs of infection or rash on extremities or on axial skeleton.  Abdomen: Soft nontender  Neuro: Cranial nerves II through XII are intact, neurovascularly intact in all extremities with 2+ DTRs and 2+ pulses.  Lymph: No lymphadenopathy of posterior or anterior cervical chain or axillae bilaterally.  Gait mild antalgic gait  MSK:  Non tender with full range of motion and good stability and symmetric strength and tone of , elbows, wrist, knee and ankles bilaterally. Mild to moderate osteophytic changes of multiple joints Hand exam shows the patient does have some weakness. Patient's weakness of the bicep. +1 DTRs on the right upper actually compared to the left side of 2+. Patient strength of the right shoulder is 3 out of 5 compared to 5 out of 5 on the contralateral side. Positive impingement of the shoulders..  Neck: Inspection unremarkable. No palpable stepoffs. Positive Spurling's with right-sided radiculopathy Limitation  in the last 15 of extension and right-sided side bending and rotation. Tightness of the right trapezius. Grip strength and sensation normal in bilateral hands Strength good C4 to T1 distribution No sensory change to C4 to T1 Negative Hoffman sign bilaterally Reflexes normal  Procedure: Real-time Ultrasound Guided Injection of right glenohumeral joint Device: GE Logiq E  Ultrasound guided injection is preferred based studies that show  increased duration, increased effect, greater accuracy, decreased procedural pain, increased response rate with ultrasound guided versus blind injection.  Verbal informed consent obtained.  Time-out conducted.  Noted no overlying erythema, induration, or other signs of local infection.  Skin prepped in a sterile fashion.  Local anesthesia: Topical Ethyl chloride.  With sterile technique and under real time ultrasound guidance:  Joint visualized.  23g 1  inch needle inserted posterior approach. Pictures taken for needle placement. Patient did have injection of 2 cc of 1% lidocaine, 2 cc of 0.5% Marcaine, and 1.0 cc of Kenalog 40 mg/dL. Completed without difficulty  Pain immediately resolved suggesting accurate placement of the medication.  Advised to call if fevers/chills, erythema, induration, drainage, or persistent bleeding.  Images permanently stored and available for review in the ultrasound unit.  Impression: Technically successful ultrasound guided injection.    Impression and Recommendations:     This case required medical decision making of moderate complexity.

## 2014-12-08 NOTE — Patient Instructions (Addendum)
Great to see you Happy holidays!  We will get xrays today MRI of neck and shoulder.  Concern you have a herniated  Disc and possibly rotator cuff tear We will get MRI of both and will discuss when we get the results Percocet up to 2 times a day if needed.

## 2014-12-08 NOTE — Assessment & Plan Note (Signed)
Patient is not responding to conservative therapy at this time. I do have decreasing deep tendon reflexes as well as weakness of the right extremity up for her. I'm concerned the patient does have cervical radiculopathy. We discussed that if any worsening symptoms she will need to go to the emergency department. We tried an injection today for diagnostic as well as therapeutic. Patient denied any significant improvement after the injection. I do believe the patient does have likely rotator cuff injuries well. Due to patient's weakness I do feel that advance imaging is ordered. X-rays ordered today but we will do a stat MRI. Patient gets any worsening weakness or pain she's got the emergency room. Patient given pain medications. Patient understands and what to do next.

## 2014-12-08 NOTE — Assessment & Plan Note (Signed)
Patient overall is doing worse than previously. I'm concerned the patient also may have a rotator cuff tear. Did not respond as well to the injection today. I do think though that radiculopathy from the neck is the most likely differential but I feel that if doing the MRI secondary to the weakness should MRI the shoulder as well. Depending on findings this will change medical management.

## 2014-12-08 NOTE — Progress Notes (Signed)
Pre visit review using our clinic review tool, if applicable. No additional management support is needed unless otherwise documented below in the visit note. 

## 2014-12-09 ENCOUNTER — Other Ambulatory Visit (INDEPENDENT_AMBULATORY_CARE_PROVIDER_SITE_OTHER): Payer: Medicare Other

## 2014-12-09 ENCOUNTER — Telehealth: Payer: Self-pay | Admitting: Family Medicine

## 2014-12-09 ENCOUNTER — Other Ambulatory Visit: Payer: Self-pay | Admitting: Family Medicine

## 2014-12-09 ENCOUNTER — Telehealth: Payer: Self-pay | Admitting: *Deleted

## 2014-12-09 DIAGNOSIS — M25511 Pain in right shoulder: Secondary | ICD-10-CM

## 2014-12-09 DIAGNOSIS — D72829 Elevated white blood cell count, unspecified: Secondary | ICD-10-CM | POA: Diagnosis not present

## 2014-12-09 DIAGNOSIS — M542 Cervicalgia: Secondary | ICD-10-CM

## 2014-12-09 LAB — CBC WITH DIFFERENTIAL/PLATELET
BASOS PCT: 0.4 % (ref 0.0–3.0)
Basophils Absolute: 0 10*3/uL (ref 0.0–0.1)
EOS ABS: 0 10*3/uL (ref 0.0–0.7)
Eosinophils Relative: 0.3 % (ref 0.0–5.0)
HEMATOCRIT: 43.6 % (ref 36.0–46.0)
Hemoglobin: 13.9 g/dL (ref 12.0–15.0)
LYMPHS ABS: 2 10*3/uL (ref 0.7–4.0)
LYMPHS PCT: 16 % (ref 12.0–46.0)
MCHC: 31.9 g/dL (ref 30.0–36.0)
MCV: 89.7 fl (ref 78.0–100.0)
Monocytes Absolute: 0.7 10*3/uL (ref 0.1–1.0)
Monocytes Relative: 5.8 % (ref 3.0–12.0)
NEUTROS ABS: 9.9 10*3/uL — AB (ref 1.4–7.7)
Neutrophils Relative %: 77.5 % — ABNORMAL HIGH (ref 43.0–77.0)
PLATELETS: 186 10*3/uL (ref 150.0–400.0)
RBC: 4.86 Mil/uL (ref 3.87–5.11)
RDW: 14.6 % (ref 11.5–15.5)
WBC: 12.8 10*3/uL — ABNORMAL HIGH (ref 4.0–10.5)

## 2014-12-09 NOTE — Telephone Encounter (Signed)
Labs and dx?  

## 2014-12-09 NOTE — Telephone Encounter (Signed)
Left detailed msg on pt's vmail letting her know carolyn from [facility] will be calling her to schedule her for this Saturday @ 4pm. If pt has any questions she can return my call.

## 2014-12-09 NOTE — Telephone Encounter (Signed)
Patient called to advise that  imaging told her that she cannot be seen until 12/29/2014. She is advising that you told her you'd like her to have the MRI before Thanksgiving. She wants to know if there is somewhere else that she should go, or if you are ok with her waiting that long.

## 2014-12-09 NOTE — Telephone Encounter (Signed)
CBC with diff for leukocytosis

## 2014-12-13 ENCOUNTER — Ambulatory Visit (HOSPITAL_BASED_OUTPATIENT_CLINIC_OR_DEPARTMENT_OTHER)
Admission: RE | Admit: 2014-12-13 | Discharge: 2014-12-13 | Disposition: A | Discharge status: Home / Self Care | Payer: Medicare Other | Source: Ambulatory Visit | Attending: Family Medicine | Admitting: Family Medicine

## 2014-12-13 DIAGNOSIS — M4802 Spinal stenosis, cervical region: Secondary | ICD-10-CM | POA: Diagnosis not present

## 2014-12-13 DIAGNOSIS — G9589 Other specified diseases of spinal cord: Secondary | ICD-10-CM | POA: Insufficient documentation

## 2014-12-13 DIAGNOSIS — M25511 Pain in right shoulder: Secondary | ICD-10-CM

## 2014-12-13 DIAGNOSIS — M19011 Primary osteoarthritis, right shoulder: Secondary | ICD-10-CM | POA: Insufficient documentation

## 2014-12-13 DIAGNOSIS — R531 Weakness: Secondary | ICD-10-CM | POA: Diagnosis not present

## 2014-12-13 DIAGNOSIS — M542 Cervicalgia: Secondary | ICD-10-CM

## 2014-12-13 DIAGNOSIS — M1288 Other specific arthropathies, not elsewhere classified, other specified site: Secondary | ICD-10-CM | POA: Insufficient documentation

## 2014-12-15 ENCOUNTER — Other Ambulatory Visit: Payer: Self-pay | Admitting: *Deleted

## 2014-12-15 DIAGNOSIS — M5010 Cervical disc disorder with radiculopathy, unspecified cervical region: Secondary | ICD-10-CM

## 2014-12-25 ENCOUNTER — Other Ambulatory Visit: Payer: Medicare Other

## 2014-12-25 ENCOUNTER — Ambulatory Visit
Admission: RE | Admit: 2014-12-25 | Discharge: 2014-12-25 | Disposition: A | Discharge status: Home / Self Care | Payer: Medicare Other | Source: Ambulatory Visit | Attending: Family Medicine | Admitting: Family Medicine

## 2014-12-25 DIAGNOSIS — M5412 Radiculopathy, cervical region: Secondary | ICD-10-CM | POA: Diagnosis not present

## 2014-12-25 DIAGNOSIS — M5010 Cervical disc disorder with radiculopathy, unspecified cervical region: Secondary | ICD-10-CM

## 2014-12-25 MED ORDER — IOHEXOL 300 MG/ML  SOLN
1.0000 mL | Freq: Once | INTRAMUSCULAR | Status: AC | PRN
  Administered 2014-12-25: 1 mL via EPIDURAL

## 2014-12-25 MED ORDER — TRIAMCINOLONE ACETONIDE 40 MG/ML IJ SUSP (RADIOLOGY)
60.0000 mg | Freq: Once | INTRAMUSCULAR | Status: AC
  Administered 2014-12-25: 60 mg via EPIDURAL

## 2014-12-25 NOTE — Discharge Instructions (Signed)

## 2014-12-29 ENCOUNTER — Other Ambulatory Visit: Payer: Medicare Other

## 2014-12-30 ENCOUNTER — Ambulatory Visit (INDEPENDENT_AMBULATORY_CARE_PROVIDER_SITE_OTHER): Payer: Medicare Other | Admitting: *Deleted

## 2014-12-30 DIAGNOSIS — E538 Deficiency of other specified B group vitamins: Secondary | ICD-10-CM

## 2014-12-30 MED ORDER — CYANOCOBALAMIN 1000 MCG/ML IJ SOLN
1000.0000 ug | Freq: Once | INTRAMUSCULAR | Status: AC
  Administered 2014-12-30: 1000 ug via INTRAMUSCULAR

## 2014-12-30 NOTE — Progress Notes (Signed)
Patient presented for B 12 injection to right deltoid, patient tolerated injection well.

## 2015-02-03 ENCOUNTER — Ambulatory Visit: Payer: Medicare Other

## 2015-02-05 ENCOUNTER — Encounter: Payer: Self-pay | Admitting: Internal Medicine

## 2015-02-05 ENCOUNTER — Ambulatory Visit (INDEPENDENT_AMBULATORY_CARE_PROVIDER_SITE_OTHER): Payer: Medicare Other | Admitting: Internal Medicine

## 2015-02-05 VITALS — BP 153/87 | HR 77 | Temp 98.3°F | Resp 20 | Ht 62.0 in | Wt 235.4 lb

## 2015-02-05 DIAGNOSIS — R7309 Other abnormal glucose: Secondary | ICD-10-CM | POA: Diagnosis not present

## 2015-02-05 DIAGNOSIS — R739 Hyperglycemia, unspecified: Secondary | ICD-10-CM

## 2015-02-05 DIAGNOSIS — E538 Deficiency of other specified B group vitamins: Secondary | ICD-10-CM

## 2015-02-05 DIAGNOSIS — R252 Cramp and spasm: Secondary | ICD-10-CM | POA: Diagnosis not present

## 2015-02-05 LAB — COMPREHENSIVE METABOLIC PANEL
ALBUMIN: 3.9 g/dL (ref 3.5–5.2)
ALK PHOS: 71 U/L (ref 39–117)
ALT: 22 U/L (ref 0–35)
AST: 26 U/L (ref 0–37)
BUN: 11 mg/dL (ref 6–23)
CALCIUM: 9.5 mg/dL (ref 8.4–10.5)
CHLORIDE: 106 meq/L (ref 96–112)
CO2: 25 mEq/L (ref 19–32)
CREATININE: 0.63 mg/dL (ref 0.40–1.20)
GFR: 99.68 mL/min (ref >60.00)
Glucose, Bld: 93 mg/dL (ref 70–99)
Potassium: 3.7 mEq/L (ref 3.5–5.1)
Sodium: 142 mEq/L (ref 135–145)
TOTAL PROTEIN: 7.3 g/dL (ref 6.0–8.3)
Total Bilirubin: 0.5 mg/dL (ref 0.2–1.2)

## 2015-02-05 LAB — CBC
HCT: 44.2 % (ref 36.0–46.0)
Hemoglobin: 14.2 g/dL (ref 12.0–15.0)
MCHC: 32.2 g/dL (ref 30.0–36.0)
MCV: 89.6 fl (ref 78.0–100.0)
PLATELETS: 220 10*3/uL (ref 150.0–400.0)
RBC: 4.93 Mil/uL (ref 3.87–5.11)
RDW: 14.5 % (ref 11.5–15.5)
WBC: 11.4 10*3/uL — ABNORMAL HIGH (ref 4.0–10.5)

## 2015-02-05 LAB — MAGNESIUM: MAGNESIUM: 2 mg/dL (ref 1.5–2.5)

## 2015-02-05 LAB — TSH: TSH: 3.53 u[IU]/mL (ref 0.35–4.50)

## 2015-02-05 MED ORDER — ROPINIROLE HCL 0.25 MG PO TABS: 0.2500 mg | ORAL_TABLET | Freq: Every day | ORAL | Status: DC

## 2015-02-05 MED ORDER — METFORMIN HCL 500 MG PO TABS: 500.0000 mg | ORAL_TABLET | Freq: Two times a day (BID) | ORAL | Status: DC

## 2015-02-05 MED ORDER — CYANOCOBALAMIN 1000 MCG/ML IJ SOLN
1000.0000 ug | Freq: Once | INTRAMUSCULAR | Status: AC
  Administered 2015-02-05: 1000 ug via INTRAMUSCULAR

## 2015-02-05 NOTE — Assessment & Plan Note (Signed)
Muscle cramping in legs mostly at night. Will check electrolytes, thyroid function. Stop Prevagen. Start Requip at night. Discussed potential risks of this medication.

## 2015-02-05 NOTE — Progress Notes (Signed)
Pre visit review using our clinic review tool, if applicable. No additional management support is needed unless otherwise documented below in the visit note. 

## 2015-02-05 NOTE — Assessment & Plan Note (Signed)
B12 shot given today.

## 2015-02-05 NOTE — Assessment & Plan Note (Signed)
Lab Results  Component Value Date   HGBA1C 6.0 11/27/2014   Discussed elevated BG and risks of DM. Will start Metformin bid. Repeat A1c in 04/2015.

## 2015-02-05 NOTE — Assessment & Plan Note (Signed)
Wt Readings from Last 3 Encounters:  02/05/15 235 lb 6 oz (106.765 kg)  12/08/14 232 lb (105.235 kg)  11/27/14 230 lb 6 oz (104.497 kg)   Body mass index is 43.04 kg/(m^2). Strongly encouraged healthier diet and exercise. Recommended referral to nutrition. She declines. Encouraged her to start exercising.

## 2015-02-05 NOTE — Patient Instructions (Addendum)
Stop Prevagen.  Start exercising by walking 10-29mn daily. Keep a log of exercise and bring to next visit.  Start Requip 0.220mat night to help with cramps.  Limit carbohydrate intake to 35gm with a meal and 15gm with a snack.  Start Metformin 50033mwice daily. We will check A1c in 04/2015.

## 2015-02-05 NOTE — Progress Notes (Signed)
Subjective:    Patient ID: Morgan Hurst, female    DOB: 1945/03/20, 70 y.o.   MRN: [id number]  HPI  70YO female presents for acute visit.  Muscle cramping - Worsening in legs and arms. Wakes several times per night with cramping. Typically 3 times per night. Stopped her Zanaflex and Gabapentin because back pain was better. Continues to take Tramadol prn. Started Tumeric and Ibuprofen and Vit D with no improvement. Feet and legs swelling. Worse at night. Improved in morning. Completely sedentary. Not following any particular diet.  Wt Readings from Last 3 Encounters:  02/05/15 235 lb 6 oz (106.765 kg)  12/08/14 232 lb (105.235 kg)  11/27/14 230 lb 6 oz (104.497 kg)   BP Readings from Last 3 Encounters:  02/05/15 153/87  12/25/14 160/74  12/08/14 130/82    Past Medical History  Diagnosis Date  . Arthritis   . Endometriosis    Family History  Problem Relation Age of Onset  . Diabetes Mother   . Cancer Mother     colon and breast  . Cancer Father     Leukemia  . Cancer Brother     colon and lung  . Cholelithiasis Daughter   . Heart disease Paternal Grandfather   . Cancer Other 29    colon   Past Surgical History  Procedure Laterality Date  . Abdominal hysterectomy  age 79  . Ankle surgery    . Vaginal delivery  2   Social History   Social History  . Marital Status: Widowed    Spouse Name: N/A  . Number of Children: N/A  . Years of Education: N/A   Social History Main Topics  . Smoking status: Never Smoker   . Smokeless tobacco: None  . Alcohol Use: No  . Drug Use: No  . Sexual Activity: Not Asked   Other Topics Concern  . None   Social History Narrative   Lives with husband in [place], dog outside.      Work - retired from [employer] - regular   Exercise - walks occasionally, limited by fatigue    Review of Systems  Constitutional: Negative for fever, chills, appetite change, fatigue and unexpected weight change.    Eyes: Negative for visual disturbance.  Respiratory: Negative for shortness of breath.   Cardiovascular: Negative for chest pain and leg swelling.  Gastrointestinal: Negative for nausea, vomiting, abdominal pain, diarrhea and constipation.  Musculoskeletal: Positive for myalgias, back pain and arthralgias.  Skin: Negative for color change and rash.  Neurological: Negative for weakness and numbness.  Hematological: Negative for adenopathy. Does not bruise/bleed easily.  Psychiatric/Behavioral: Positive for sleep disturbance. Negative for dysphoric mood. The patient is not nervous/anxious.        Objective:    BP 153/87 mmHg  Pulse 77  Temp(Src) 98.3 F (36.8 C) (Oral)  Resp 20  Ht 5' 2"  (1.575 m)  Wt 235 lb 6 oz (106.765 kg)  BMI 43.04 kg/m2  SpO2 96% Physical Exam  Constitutional: She is oriented to person, place, and time. She appears well-developed and well-nourished. No distress.  HENT:  Head: Normocephalic and atraumatic.  Right Ear: External ear normal.  Left Ear: External ear normal.  Nose: Nose normal.  Mouth/Throat: Oropharynx is clear and moist. No oropharyngeal exudate.  Eyes: Conjunctivae are normal. Pupils are equal, round, and reactive to light. Right eye exhibits no discharge. Left eye exhibits no discharge. No scleral icterus.  Neck:  Normal range of motion. Neck supple. No tracheal deviation present. No thyromegaly present.  Cardiovascular: Normal rate, regular rhythm, normal heart sounds and intact distal pulses.  Exam reveals no gallop and no friction rub.   No murmur heard. Pulmonary/Chest: Effort normal and breath sounds normal. No respiratory distress. She has no wheezes. She has no rales. She exhibits no tenderness.  Musculoskeletal: Normal range of motion. She exhibits no edema or tenderness.  Lymphadenopathy:    She has no cervical adenopathy.  Neurological: She is alert and oriented to person, place, and time. No cranial nerve deficit. She exhibits  normal muscle tone. Coordination normal.  Skin: Skin is warm and dry. No rash noted. She is not diaphoretic. No erythema. No pallor.  Psychiatric: She has a normal mood and affect. Her behavior is normal. Judgment and thought content normal.          Assessment & Plan:   Problem List Items Addressed This Visit      Unprioritized   B12 deficiency    B12 shot given today.      Relevant Medications   cyanocobalamin ((VITAMIN B-12)) injection 1,000 mcg (Completed)   Elevated blood sugar    Lab Results  Component Value Date   HGBA1C 6.0 11/27/2014   Discussed elevated BG and risks of DM. Will start Metformin bid. Repeat A1c in 04/2015.      Muscle cramp - Primary    Muscle cramping in legs mostly at night. Will check electrolytes, thyroid function. Stop Prevagen. Start Requip at night. Discussed potential risks of this medication.      Relevant Orders   Comprehensive metabolic panel   Magnesium   TSH   CBC   Severe obesity (BMI >= 40) (HCC)    Wt Readings from Last 3 Encounters:  02/05/15 235 lb 6 oz (106.765 kg)  12/08/14 232 lb (105.235 kg)  11/27/14 230 lb 6 oz (104.497 kg)   Body mass index is 43.04 kg/(m^2). Strongly encouraged healthier diet and exercise. Recommended referral to nutrition. She declines. Encouraged her to start exercising.      Relevant Medications   metFORMIN (GLUCOPHAGE) 500 MG tablet       Return in about 4 weeks (around 03/05/2015) for Recheck.

## 2015-02-26 DIAGNOSIS — H10023 Other mucopurulent conjunctivitis, bilateral: Secondary | ICD-10-CM | POA: Diagnosis not present

## 2015-02-27 ENCOUNTER — Ambulatory Visit: Payer: Medicare Other | Admitting: Internal Medicine

## 2015-03-10 ENCOUNTER — Encounter: Payer: Self-pay | Admitting: Internal Medicine

## 2015-03-10 ENCOUNTER — Other Ambulatory Visit: Payer: Self-pay | Admitting: Internal Medicine

## 2015-03-10 ENCOUNTER — Ambulatory Visit
Admission: RE | Admit: 2015-03-10 | Discharge: 2015-03-10 | Disposition: A | Discharge status: Home / Self Care | Payer: Medicare Other | Source: Ambulatory Visit | Attending: Internal Medicine | Admitting: Internal Medicine

## 2015-03-10 ENCOUNTER — Ambulatory Visit (INDEPENDENT_AMBULATORY_CARE_PROVIDER_SITE_OTHER): Payer: Medicare Other | Admitting: Internal Medicine

## 2015-03-10 VITALS — BP 113/68 | HR 74 | Temp 97.9°F | Ht 62.0 in | Wt 220.0 lb

## 2015-03-10 DIAGNOSIS — R1032 Left lower quadrant pain: Secondary | ICD-10-CM

## 2015-03-10 DIAGNOSIS — R911 Solitary pulmonary nodule: Secondary | ICD-10-CM | POA: Insufficient documentation

## 2015-03-10 DIAGNOSIS — E538 Deficiency of other specified B group vitamins: Secondary | ICD-10-CM

## 2015-03-10 DIAGNOSIS — K8020 Calculus of gallbladder without cholecystitis without obstruction: Secondary | ICD-10-CM | POA: Insufficient documentation

## 2015-03-10 DIAGNOSIS — K5730 Diverticulosis of large intestine without perforation or abscess without bleeding: Secondary | ICD-10-CM | POA: Insufficient documentation

## 2015-03-10 DIAGNOSIS — R079 Chest pain, unspecified: Secondary | ICD-10-CM | POA: Diagnosis not present

## 2015-03-10 DIAGNOSIS — R252 Cramp and spasm: Secondary | ICD-10-CM

## 2015-03-10 DIAGNOSIS — N200 Calculus of kidney: Secondary | ICD-10-CM | POA: Diagnosis not present

## 2015-03-10 DIAGNOSIS — R112 Nausea with vomiting, unspecified: Secondary | ICD-10-CM | POA: Insufficient documentation

## 2015-03-10 LAB — COMPREHENSIVE METABOLIC PANEL
ALBUMIN: 4 g/dL (ref 3.5–5.2)
ALT: 21 U/L (ref 0–35)
AST: 27 U/L (ref 0–37)
Alkaline Phosphatase: 55 U/L (ref 39–117)
BILIRUBIN TOTAL: 0.5 mg/dL (ref 0.2–1.2)
BUN: 12 mg/dL (ref 6–23)
CALCIUM: 9.6 mg/dL (ref 8.4–10.5)
CHLORIDE: 106 meq/L (ref 96–112)
CO2: 28 meq/L (ref 19–32)
Creatinine, Ser: 0.63 mg/dL (ref 0.40–1.20)
GFR: 99.66 mL/min (ref >60.00)
Glucose, Bld: 97 mg/dL (ref 70–99)
Potassium: 3.8 mEq/L (ref 3.5–5.1)
SODIUM: 141 meq/L (ref 135–145)
Total Protein: 7.1 g/dL (ref 6.0–8.3)

## 2015-03-10 LAB — CBC
HCT: 43.7 % (ref 36.0–46.0)
Hemoglobin: 14.5 g/dL (ref 12.0–15.0)
MCHC: 33.1 g/dL (ref 30.0–36.0)
MCV: 87.4 fl (ref 78.0–100.0)
Platelets: 188 10*3/uL (ref 150.0–400.0)
RBC: 5.01 Mil/uL (ref 3.87–5.11)
RDW: 13.9 % (ref 11.5–15.5)
WBC: 8.1 10*3/uL (ref 4.0–10.5)

## 2015-03-10 LAB — LIPASE: LIPASE: 10 U/L — AB (ref 11.0–59.0)

## 2015-03-10 MED ORDER — IOHEXOL 300 MG/ML  SOLN
100.0000 mL | Freq: Once | INTRAMUSCULAR | Status: AC | PRN
  Administered 2015-03-10: 100 mL via INTRAVENOUS

## 2015-03-10 MED ORDER — CYANOCOBALAMIN 1000 MCG/ML IJ SOLN
1000.0000 ug | Freq: Once | INTRAMUSCULAR | Status: AC
  Administered 2015-03-10: 1000 ug via INTRAMUSCULAR

## 2015-03-10 NOTE — Addendum Note (Signed)
Addended by: Vernetta Honey on: 03/10/2015 10:37 AM   Modules accepted: Orders

## 2015-03-10 NOTE — Progress Notes (Signed)
Subjective:    Patient ID: Morgan Hurst, female    DOB: Dec 13, 1945, 70 y.o.   MRN: [id number]  HPI  70YO female presents for followup.  Muscle cramping - Last visit, started on Requip. Symptoms improved with Requip.  Elevated BG - Started on Metformin last visit. Tolerating well. No side effects noted.  Following a healthy diet and limiting carbs. Walking most days. No CP or dyspnea with walking. Staying active.  Left chest and upper abdominal pain mostly at night last few weeks. Occurs without clear trigger about  5pm most days. Occurs at rest. Not necessarily after eating. More belching.  3 times nausea and vomiting at night. No clear trigger.  Taking Omeprazole and Pepcid with no improvement. No diaphoresis or dyspnea. Stress test in distant past normal, 20 years ago. No change in bowel habits. Colonoscopy 2014 showed diverticuli. Strong family h/o colon cancer. No urinary symptoms.  Wt Readings from Last 3 Encounters:  03/10/15 220 lb (99.791 kg)  02/05/15 235 lb 6 oz (106.765 kg)  12/08/14 232 lb (105.235 kg)   BP Readings from Last 3 Encounters:  03/10/15 113/68  02/05/15 153/87  12/25/14 160/74    Past Medical History  Diagnosis Date  . Arthritis   . Endometriosis    Family History  Problem Relation Age of Onset  . Diabetes Mother   . Cancer Mother     colon and breast  . Cancer Father     Leukemia  . Cancer Brother     colon and lung  . Cholelithiasis Daughter   . Heart disease Paternal Grandfather   . Cancer Other 29    colon   Past Surgical History  Procedure Laterality Date  . Abdominal hysterectomy  age 44  . Ankle surgery    . Vaginal delivery  2   Social History   Social History  . Marital Status: Widowed    Spouse Name: N/A  . Number of Children: N/A  . Years of Education: N/A   Social History Main Topics  . Smoking status: Never Smoker   . Smokeless tobacco: None  . Alcohol Use: No  . Drug Use: No  . Sexual Activity: Not Asked     Other Topics Concern  . None   Social History Narrative   Lives with husband in [place], dog outside.      Work - retired from [employer] - regular   Exercise - walks occasionally, limited by fatigue    Review of Systems  Constitutional: Negative for fever, chills, appetite change, fatigue and unexpected weight change.  Eyes: Negative for visual disturbance.  Respiratory: Negative for cough and shortness of breath.   Cardiovascular: Positive for chest pain. Negative for palpitations and leg swelling.  Gastrointestinal: Positive for nausea, vomiting and abdominal pain. Negative for diarrhea, constipation and blood in stool.  Musculoskeletal: Positive for myalgias and arthralgias.  Skin: Negative for color change and rash.  Hematological: Negative for adenopathy. Does not bruise/bleed easily.  Psychiatric/Behavioral: Negative for sleep disturbance and dysphoric mood. The patient is not nervous/anxious.        Objective:    BP 113/68 mmHg  Pulse 74  Temp(Src) 97.9 F (36.6 C) (Oral)  Ht 5' 2"  (1.575 m)  Wt 220 lb (99.791 kg)  BMI 40.23 kg/m2  SpO2 95% Physical Exam  Constitutional: She is oriented to person, place, and time. She appears well-developed and well-nourished. No distress.  HENT:  Head:  Normocephalic and atraumatic.  Right Ear: External ear normal.  Left Ear: External ear normal.  Nose: Nose normal.  Mouth/Throat: Oropharynx is clear and moist. No oropharyngeal exudate.  Eyes: Conjunctivae are normal. Pupils are equal, round, and reactive to light. Right eye exhibits no discharge. Left eye exhibits no discharge. No scleral icterus.  Neck: Normal range of motion. Neck supple. No tracheal deviation present. No thyromegaly present.  Cardiovascular: Normal rate, regular rhythm, normal heart sounds and intact distal pulses.  Exam reveals no gallop and no friction rub.   No murmur heard. Pulmonary/Chest: Effort normal and breath sounds  normal. No respiratory distress. She has no wheezes. She has no rales. She exhibits no tenderness.  Abdominal: Soft. Normal appearance. There is no hepatosplenomegaly. There is tenderness in the left lower quadrant. There is guarding. There is no rigidity, no rebound and no CVA tenderness.    Musculoskeletal: Normal range of motion. She exhibits no edema or tenderness.  Lymphadenopathy:    She has no cervical adenopathy.  Neurological: She is alert and oriented to person, place, and time. No cranial nerve deficit. She exhibits normal muscle tone. Coordination normal.  Skin: Skin is warm and dry. No rash noted. She is not diaphoretic. No erythema. No pallor.  Psychiatric: She has a normal mood and affect. Her behavior is normal. Judgment and thought content normal.          Assessment & Plan:   Problem List Items Addressed This Visit      Unprioritized   Abdominal pain, left lower quadrant    Left lower-mid abdominal pain with significant tenderness on exam. Concerning for diverticulitis. Will get CT abdomen for further evaluation.      Relevant Orders   CT Abdomen Pelvis W Contrast   Left sided chest pain    Left sided chest and abdominal pain. Symptoms do not suggest CAD. However has risks for CAD including elevated BG,DM, sedentary lifestyle. EKG today shows no acute ST changes. Pain most likely referred pain from upper abdomen. Further evaluation including CT pending.      Relevant Orders   EKG 12-Lead (Completed)   Muscle cramp - Primary    Nocturnal leg cramps improved with Requip. Will continue.      Nausea with vomiting    Recent NV and left lower abdominal pain. Exam remarkable for tenderness in LLQ. Symptoms and exam concerning for diverticulitis. Will set up CT scan for evaluation.      Relevant Orders   H. pylori breath test   Lipase   CBC   Comprehensive metabolic panel   Severe obesity (BMI >= 40) (HCC)    Wt Readings from Last 3 Encounters:  03/10/15 220  lb (99.791 kg)  02/05/15 235 lb 6 oz (106.765 kg)  12/08/14 232 lb (105.235 kg)   Congratulated pt on weight loss. Encouraged continued healthy diet and exercise.          Return in about 1 week (around 03/17/2015) for Recheck.

## 2015-03-10 NOTE — Progress Notes (Signed)
Pre visit review using our clinic review tool, if applicable. No additional management support is needed unless otherwise documented below in the visit note. 

## 2015-03-10 NOTE — Assessment & Plan Note (Signed)
Wt Readings from Last 3 Encounters:  03/10/15 220 lb (99.791 kg)  02/05/15 235 lb 6 oz (106.765 kg)  12/08/14 232 lb (105.235 kg)   Congratulated pt on weight loss. Encouraged continued healthy diet and exercise.

## 2015-03-10 NOTE — Assessment & Plan Note (Signed)
Recent NV and left lower abdominal pain. Exam remarkable for tenderness in LLQ. Symptoms and exam concerning for diverticulitis. Will set up CT scan for evaluation.

## 2015-03-10 NOTE — Assessment & Plan Note (Signed)
Nocturnal leg cramps improved with Requip. Will continue.

## 2015-03-10 NOTE — Assessment & Plan Note (Signed)
Left lower-mid abdominal pain with significant tenderness on exam. Concerning for diverticulitis. Will get CT abdomen for further evaluation.

## 2015-03-10 NOTE — Assessment & Plan Note (Signed)
Left sided chest and abdominal pain. Symptoms do not suggest CAD. However has risks for CAD including elevated BG,DM, sedentary lifestyle. EKG today shows no acute ST changes. Pain most likely referred pain from upper abdomen. Further evaluation including CT pending.

## 2015-03-10 NOTE — Patient Instructions (Signed)
Labs today.  CT of the abdomen today to determine cause of abdominal pain. We will call you with results.  Follow up in 1 week.

## 2015-03-11 ENCOUNTER — Encounter: Payer: Self-pay | Admitting: *Deleted

## 2015-03-11 ENCOUNTER — Telehealth: Payer: Self-pay | Admitting: *Deleted

## 2015-03-11 LAB — H. PYLORI BREATH TEST: H. pylori Breath Test: NOT DETECTED

## 2015-03-11 MED ORDER — PANTOPRAZOLE SODIUM 40 MG PO TBEC: 40.0000 mg | DELAYED_RELEASE_TABLET | Freq: Every day | ORAL | Status: DC

## 2015-03-11 NOTE — Telephone Encounter (Signed)
Patient stated that she had a CT scan on 03/10/15, she received her results, however she would like clarity from Dr.Walker with a better understanding of what was explained too her on yesterday about her results.  Pt Cont [phone]

## 2015-03-11 NOTE — Telephone Encounter (Signed)
Spoke with the patient and review results again.  She verbalized understanding and I explained referral process to her.   She is concerned that she is having severe heart burn even with eating toast and water.  Belching a lot and nauseated.  Is there anything that she can take for this until she follows up with the surgeon.  Thanks

## 2015-03-11 NOTE — Telephone Encounter (Signed)
Attempted to call the patient.  Left a VM with instructions.

## 2015-03-11 NOTE — Telephone Encounter (Signed)
Please start Pantoprazole 38m daily. #30 with 3 refills. I am still waiting on some of the testing, including H. Pylori testing to come back.

## 2015-03-12 ENCOUNTER — Other Ambulatory Visit (INDEPENDENT_AMBULATORY_CARE_PROVIDER_SITE_OTHER): Payer: Medicare Other

## 2015-03-12 DIAGNOSIS — N200 Calculus of kidney: Secondary | ICD-10-CM | POA: Diagnosis not present

## 2015-03-12 LAB — URINALYSIS, ROUTINE W REFLEX MICROSCOPIC
BILIRUBIN URINE: NEGATIVE
Ketones, ur: NEGATIVE
Leukocytes, UA: NEGATIVE
Nitrite: NEGATIVE
RBC / HPF: NONE SEEN (ref 0–?)
Specific Gravity, Urine: 1.025 (ref 1.000–1.030)
TOTAL PROTEIN, URINE-UPE24: NEGATIVE
URINE GLUCOSE: NEGATIVE
UROBILINOGEN UA: 0.2 (ref 0.0–1.0)
WBC, UA: NONE SEEN (ref 0–?)
pH: 5.5 (ref 5.0–8.0)

## 2015-03-16 ENCOUNTER — Other Ambulatory Visit: Payer: Medicare Other

## 2015-03-17 ENCOUNTER — Ambulatory Visit: Payer: Medicare Other | Admitting: Internal Medicine

## 2015-03-19 ENCOUNTER — Encounter: Payer: Self-pay | Admitting: General Surgery

## 2015-03-19 ENCOUNTER — Ambulatory Visit (INDEPENDENT_AMBULATORY_CARE_PROVIDER_SITE_OTHER): Payer: Medicare Other | Admitting: General Surgery

## 2015-03-19 VITALS — BP 132/76 | HR 74 | Resp 12 | Ht 61.0 in | Wt 219.0 lb

## 2015-03-19 DIAGNOSIS — R079 Chest pain, unspecified: Secondary | ICD-10-CM | POA: Diagnosis not present

## 2015-03-19 DIAGNOSIS — K8020 Calculus of gallbladder without cholecystitis without obstruction: Secondary | ICD-10-CM | POA: Diagnosis not present

## 2015-03-19 MED ORDER — ONDANSETRON 4 MG PO TBDP: 4.0000 mg | ORAL_TABLET | Freq: Three times a day (TID) | ORAL | Status: DC | PRN

## 2015-03-19 NOTE — Progress Notes (Signed)
Patient ID: Morgan Hurst, female   DOB: [birth year], 70 y.o.   MRN: [id number]  Chief Complaint  Patient presents with  . Abdominal Pain    HPI Morgan Hurst is a 70 y.o. female here today for evaluation of gallstones. Patient states she is not having any abdominal pain just having vomiting and nausea for about two weeks. She denies any significant heartburn or regurgitation of solid or fluid. She reports that she has obtained relief making use of per tonics up to 3 times per day, describing her pain abating after about 45 minutes. Patient reports she is having a burning/stinging pain in her chest wall area, pointing to the area under her left breast. She does report significant more belching than baseline. to more belching. On the few occasions when she has had vomiting, she describes undigested food. No blood or mucus. Gas X has not helped her symptoms. Initially, her symptoms would develop late in the afternoon, now they are present throughout the day. There is no particular food that is triggering, now it seems almost all foods increase her discomfort. No previous history of intolerance of food.  Earlier this year she was told that her blood sugar was creeping up and she modified her diet and began walking program. This has resulted in a 15 pound weight loss.. Bowels move regular and no bleeding or mucus.. Denies fever or chills.  She started out having evening belching and heartburn but now she wakes up having the symptoms.  She started a weight loss regimen with walking in January and has lost about 15 pounds.  CT scan done 03-10-15 days prior identified gallstones.  The patient is accompanied today by her daughter.  She was widowed one year ago after 73 years of marriage. Her husband has suffered from Parkinson's developed pneumonia and did not recover. I personally reviewed the patient's history.  HPI  Past Medical History  Diagnosis Date  . Arthritis   . Endometriosis      Past Surgical History  Procedure Laterality Date  . Abdominal hysterectomy  age 72  . Ankle surgery    . Vaginal delivery  2    Family History  Problem Relation Age of Onset  . Diabetes Mother   . Cancer Mother     colon and breast  . Cancer Father     Leukemia  . Cancer Brother     colon and lung  . Cholelithiasis Daughter   . Heart disease Paternal Grandfather   . Cancer Other 81    colon    Social History Social History  Substance Use Topics  . Smoking status: Never Smoker   . Smokeless tobacco: None  . Alcohol Use: No    No Known Allergies  Current Outpatient Prescriptions  Medication Sig Dispense Refill  . acetaminophen (TYLENOL) 325 MG tablet Take 650 mg by mouth every 6 (six) hours as needed.    Marland Kitchen aspirin 81 MG tablet Take 81 mg by mouth daily.    . Cholecalciferol (VITAMIN D) 2000 UNITS CAPS Take 1 capsule by mouth daily.    . Iron-Vit C-Vit B12-Folic Acid (IRON 151 PLUS PO) Take by mouth.    . metFORMIN (GLUCOPHAGE) 500 MG tablet Take 1 tablet (500 mg total) by mouth 2 (two) times daily with a meal. 180 tablet 3  . naproxen (NAPROSYN) 500 MG tablet TK 1 T PO  BID  0  . pantoprazole (PROTONIX) 40 MG tablet Take 1 tablet (40 mg total) by mouth  daily. 30 tablet 3  . rOPINIRole (REQUIP) 0.25 MG tablet Take 1 tablet (0.25 mg total) by mouth at bedtime. 30 tablet 3  . traMADol (ULTRAM) 50 MG tablet TAKE 1 TABLET BY MOUTH EVERY 8 HOURS AS NEEDED 270 tablet 0  . ondansetron (ZOFRAN ODT) 4 MG disintegrating tablet Take 1 tablet (4 mg total) by mouth every 8 (eight) hours as needed for nausea or vomiting. 20 tablet 0   No current facility-administered medications for this visit.    Review of Systems Review of Systems  Constitutional: Negative.  Negative for unexpected weight change.  HENT: Negative.   Eyes: Negative.   Respiratory: Negative.   Cardiovascular: Negative.   Gastrointestinal: Positive for nausea and vomiting. Negative for abdominal pain.   Endocrine: Negative.   Genitourinary: Negative.   Neurological: Negative.   Hematological: Negative.   Psychiatric/Behavioral: Negative.     Blood pressure 132/76, pulse 74, resp. rate 12, height 5' 1"  (1.549 m), weight 219 lb (99.338 kg).  Physical Exam Physical Exam  Constitutional: She is oriented to person, place, and time. She appears well-developed and well-nourished.  HENT:  Mouth/Throat: Oropharynx is clear and moist.  Eyes: Conjunctivae are normal. No scleral icterus.  Neck: Neck supple.  Cardiovascular: Normal rate and regular rhythm.   Murmur heard.  Systolic murmur is present with a grade of 2/6  Pulses:      Femoral pulses are 2+ on the right side, and 2+ on the left side.      Dorsalis pedis pulses are 2+ on the right side, and 2+ on the left side.  No lower leg edema  Pulmonary/Chest: Effort normal and breath sounds normal.    Abdominal: Soft. Normal appearance and bowel sounds are normal. There is tenderness.  Tender left mid abdomen.  Neurological: She is oriented to person, place, and time.  Skin: Skin is warm and dry.  Psychiatric: Her behavior is normal.    Data Reviewed CT scan of the abdomen and pelvis dated March 10, 2015 was reviewed. Calcified gallstone in the neck of the gallbladder. No gallbladder wall thickening. No peri- cholecystic fluid.  No gross gastric pathology. No significant hiatal hernia. Small indeterminate left lower lobe pulmonary nodule. Nonobstructing stone in the renal pelvis.  Laboratory studies from 03/10/2015 showed a normal CBC, comprehensive metabolic panel and lipase.  PCP notes of 03/10/2015.    Assessment    Atypical left chest pain.  Long-standing gallstone, unlikely source of present symptoms.    Plan    The patient has tramadol available for hip pain, and was encouraged to make use of this (she usually uses one half tablet) sees for relief of her pain if needed. A prescription for Zofran was sent to her  pharmacy.    The case will need to be reviewed with her primary care physician as there is no clear evidence of a biliary source for her pain. While it could be a very atypical presentation of biliary tract disease, I think a formal cardiac evaluation would be in order prior to elective cholecystectomy.     PCP:  Ronette Deter A This information has been scribed by Karie Fetch [place].   Robert Bellow 03/20/2015, 3:57 PM

## 2015-03-19 NOTE — Patient Instructions (Signed)
The patient is aware to call back for any questions or concerns.  

## 2015-03-20 ENCOUNTER — Other Ambulatory Visit: Payer: Self-pay | Admitting: Internal Medicine

## 2015-03-20 ENCOUNTER — Telehealth: Payer: Self-pay | Admitting: Internal Medicine

## 2015-03-20 DIAGNOSIS — R079 Chest pain, unspecified: Secondary | ICD-10-CM | POA: Insufficient documentation

## 2015-03-20 DIAGNOSIS — K8020 Calculus of gallbladder without cholecystitis without obstruction: Secondary | ICD-10-CM | POA: Insufficient documentation

## 2015-03-20 NOTE — Telephone Encounter (Signed)
Spoke with the patient this afternoon at 4:15 PM. She had made use of tramadol with good relief of her symptoms. She has not needed Zofran.  The plans for cardiac evaluation were reviewed.  Dr. Gilford Rile is arranging for cardiac evaluation. When this is complete and there is no cardiac source for her pain identified we'll arrange for elective cholecystectomy.

## 2015-03-20 NOTE — Telephone Encounter (Signed)
Pt called stating that Dr Terri Piedra stated he needed to speak to Dr Gilford Rile regarding what's the next step for pt. Pt saw Dr Terri Piedra yesterday @4 :30pm. Call pt @ [phone] or cell [phone]. Thank you!

## 2015-03-23 ENCOUNTER — Ambulatory Visit (INDEPENDENT_AMBULATORY_CARE_PROVIDER_SITE_OTHER): Payer: Medicare Other | Admitting: Family Medicine

## 2015-03-23 ENCOUNTER — Ambulatory Visit: Payer: Medicare Other | Admitting: Family Medicine

## 2015-03-23 ENCOUNTER — Other Ambulatory Visit (INDEPENDENT_AMBULATORY_CARE_PROVIDER_SITE_OTHER): Payer: Medicare Other

## 2015-03-23 ENCOUNTER — Encounter: Payer: Self-pay | Admitting: Family Medicine

## 2015-03-23 VITALS — BP 118/82 | HR 76 | Wt 218.0 lb

## 2015-03-23 DIAGNOSIS — M1990 Unspecified osteoarthritis, unspecified site: Secondary | ICD-10-CM

## 2015-03-23 DIAGNOSIS — M25551 Pain in right hip: Secondary | ICD-10-CM | POA: Diagnosis not present

## 2015-03-23 DIAGNOSIS — M1611 Unilateral primary osteoarthritis, right hip: Secondary | ICD-10-CM

## 2015-03-23 NOTE — Assessment & Plan Note (Signed)
Patient was given another injection today and tolerated the procedure very well. We discussed icing regimen and home exercises. We discussed which activities to do an which was potentially avoid. We discussed topical anti-inflammatories and was given a sciatic trial size. Discuss continuing over-the-counter medications. Patient will come back and see me again in 4 weeks if not completely resolved. Patient does well we can repeat injection every 12 weeks. If symptoms exacerbate again prior to 12 weeks surgical intervention may be necessary.

## 2015-03-23 NOTE — Progress Notes (Signed)
[facility] [place], [place] [address] Phone: [phone] Subjective:     CC: Right hip pain follow-up  [id number] Morgan Hurst is a 70 y.o. female coming in with complaint of Right hip pain. Been moderate OA for a  While.  Injected in 8/16.  Patient had been doing very well until the last 2 weeks. Patient was in a department store and made a quick turn and unfortunately had pain immediately. Since then has had more right groin pain. Feels very similar to prior to her last injection. Discussed the pain is more of a dull, throbbing aching sensation. Starting to affect the way she walks. still avoid any surgical intervention.    Past Medical History  Diagnosis Date  . Arthritis   . Endometriosis    Past Surgical History  Procedure Laterality Date  . Abdominal hysterectomy  age 63  . Ankle surgery    . Vaginal delivery  2   Social History  Substance Use Topics  . Smoking status: Never Smoker   . Smokeless tobacco: None  . Alcohol Use: No   No Known Allergies Family History  Problem Relation Age of Onset  . Diabetes Mother   . Cancer Mother     colon and breast  . Cancer Father     Leukemia  . Cancer Brother     colon and lung  . Cholelithiasis Daughter   . Heart disease Paternal Grandfather   . Cancer Other 29    colon       Past medical history, social, surgical and family history all reviewed in electronic medical record.   Review of Systems: No headache, visual changes, nausea, vomiting, diarrhea, constipation, dizziness, abdominal pain, skin rash, fevers, chills, night sweats, weight loss, swollen lymph nodes, body aches, joint swelling, muscle aches, chest pain, shortness of breath, mood changes.   Objective Blood pressure 118/82, pulse 76, weight 218 lb (98.884 kg), SpO2 95 %. Body mass index is 41.21 kg/(m^2).   General: No apparent distress alert and oriented x3 mood and affect normal, dressed  appropriately. Obese HEENT: Pupils equal, extraocular movements intact  Respiratory: Patient's speak in full sentences and does not appear short of breath  Cardiovascular: No lower extremity edema, non tender, no erythema  Skin: Warm dry intact with no signs of infection or rash on extremities or on axial skeleton.  Abdomen: Soft nontender  Neuro: Cranial nerves II through XII are intact, neurovascularly intact in all extremities with 2+ DTRs and 2+ pulses.  Lymph: No lymphadenopathy of posterior or anterior cervical chain or axillae bilaterally.  Gait mild antalgic gait  MSK:  Non tender with full range of motion and good stability and symmetric strength and tone of , elbows, wrist, knee and ankles bilaterally. Mild to moderate osteophytic changes of multiple joints  Hip exam shows the patient has only 10 of internal rotation with severe pain. Patient does have some mild crepitus with range of motion. Neurovascular intact distally. Full strength. Deep tendon reflexes 2+.  Procedure: Real-time Ultrasound Guided Injection of right hip Device: GE Logiq E  Ultrasound guided injection is preferred based studies that show increased duration, increased effect, greater accuracy, decreased procedural pain, increased response rate with ultrasound guided versus blind injection.  Verbal informed consent obtained.  Time-out conducted.  Noted no overlying erythema, induration, or other signs of local infection.  Skin prepped in a sterile fashion.  Local anesthesia: Topical Ethyl chloride.  With sterile technique and  under real time ultrasound guidance:  Anterior capsule visualized, needle visualized going to the head neck junction at the anterior capsule. Pictures taken. Patient did have injection of 3 cc of 1% lidocaine, 3 cc of 0.5% Marcaine, and 1 cc of Kenalog 40 mg/dL. Completed without difficulty  Pain immediately resolved suggesting accurate placement of the medication.  Advised to call if  fevers/chills, erythema, induration, drainage, or persistent bleeding.  Images permanently stored and available for review in the ultrasound unit.  Impression: Technically successful ultrasound guided injection.     Impression and Recommendations:     This case required medical decision making of moderate complexity.

## 2015-03-23 NOTE — Patient Instructions (Signed)
Great to see you  Morgan Hurst is your friend Stay active and good luck with the gall bladder See me again when you need me.

## 2015-03-24 DIAGNOSIS — I208 Other forms of angina pectoris: Secondary | ICD-10-CM | POA: Diagnosis not present

## 2015-03-24 DIAGNOSIS — K8020 Calculus of gallbladder without cholecystitis without obstruction: Secondary | ICD-10-CM | POA: Diagnosis not present

## 2015-03-24 DIAGNOSIS — Z6841 Body Mass Index (BMI) 40.0 and over, adult: Secondary | ICD-10-CM | POA: Diagnosis not present

## 2015-03-24 DIAGNOSIS — Z01810 Encounter for preprocedural cardiovascular examination: Secondary | ICD-10-CM | POA: Diagnosis not present

## 2015-03-25 DIAGNOSIS — I208 Other forms of angina pectoris: Secondary | ICD-10-CM | POA: Diagnosis not present

## 2015-03-27 DIAGNOSIS — I208 Other forms of angina pectoris: Secondary | ICD-10-CM | POA: Diagnosis not present

## 2015-03-27 DIAGNOSIS — E7800 Pure hypercholesterolemia, unspecified: Secondary | ICD-10-CM | POA: Diagnosis not present

## 2015-03-27 DIAGNOSIS — Z01810 Encounter for preprocedural cardiovascular examination: Secondary | ICD-10-CM | POA: Diagnosis not present

## 2015-03-27 DIAGNOSIS — Z6841 Body Mass Index (BMI) 40.0 and over, adult: Secondary | ICD-10-CM | POA: Diagnosis not present

## 2015-03-30 ENCOUNTER — Telehealth: Payer: Self-pay | Admitting: *Deleted

## 2015-03-30 NOTE — Telephone Encounter (Signed)
Patient called wanting to arrange gallbladder surgery. This patient states she received the okay from the cardiologist to proceed.   We did receive cardiac clearance from Dr. Saralyn Pilar at [facility] which states patient is a low risk for surgery.

## 2015-04-01 ENCOUNTER — Telehealth: Payer: Self-pay | Admitting: *Deleted

## 2015-04-01 NOTE — Telephone Encounter (Signed)
Patient's surgery has been scheduled for 04-10-15 at [facility]. It is okay for patient to continue 81 mg aspirin once daily.   Cardiac clearance has been forwarded to the Pre-admission Department.

## 2015-04-02 ENCOUNTER — Other Ambulatory Visit: Payer: Self-pay | Admitting: General Surgery

## 2015-04-02 ENCOUNTER — Other Ambulatory Visit: Payer: Medicare Other

## 2015-04-02 ENCOUNTER — Encounter: Payer: Self-pay | Admitting: *Deleted

## 2015-04-02 DIAGNOSIS — K8020 Calculus of gallbladder without cholecystitis without obstruction: Secondary | ICD-10-CM

## 2015-04-02 NOTE — Pre-Procedure Instructions (Signed)
RECEIVED CLEARANCE NOTE FROM DR PARASCHOS OFFICE(PT WAS SENT TO CARDIOLOGIST BY DR Curly Shores OFFICE)

## 2015-04-02 NOTE — Pre-Procedure Instructions (Signed)
Echocardiogram stress test (03/25/2015 2:00 PM) Echocardiogram stress test (03/25/2015 2:00 PM)  Component Value Ref Range  LV Ejection Fraction (%) 55   Aortic Valve Regurgitation Grade none   Aortic Valve Stenosis Grade none   Mitral Valve Regurgitation Grade mild   Mitral Valve Stenosis Grade none   Tricuspid Valve Regurgitation Grade trivial   Tricuspid Valve Regurgitation Max Velocity (m/s) 2.1 m/sec  Right Ventricle Systolic Pressure (mmHg) 96.7 mmHg   Echocardiogram stress test (03/25/2015 2:00 PM)  Specimen Performing Laboratory   [facility] OTHER ORDERS    Echocardiogram stress test (03/25/2015 2:00 PM)  Narrative   [facility] [id number]   [place] #: [id number]   [address], [place], [place] [address] Date: 03/25/2015 01:37 PM  Adult Female Age: 71 yrs   ECHOCARDIOGRAM REPORTOutpatient  KC::KCWC   STUDY:Stress Echo TAPE:MD1:PARASCHOS, ALEXANDER  ECHO:Yes DOPPLER:Yes FILE:0000-000-000BP: 153/88 mmHg   COLOR:YesCONTRAST:NoMACHINE:PhilipsHeight: 61 in   RV BIOPSY:No 3D:No SOUND QLTY:Moderate Weight: 220 lb  MEDIUM:None BSA: 2.0 m2    ___________________________________________________________________________________________  HISTORY:Chest pain   REASON:Assess, LV function   Indication:I20.8 Other forms of angina pectoris     ___________________________________________________________________________________________  STRESS ECHOCARDIOGRAPHY     Protocol:Treadmill (Bruce)  Drugs:None  Target Heart Rate:129 bpmMaximum Predicted Heart Rate: 152 bpm    +-------------------+-------------------------+-------------------------+------------+   Stage  Duration (mm:ss) Heart Rate (bpm) BP   +-------------------+-------------------------+-------------------------+------------+  RESTING 77  153/88   +-------------------+-------------------------+-------------------------+------------+  EXERCISE  5:01 153  /  +-------------------+-------------------------+-------------------------+------------+  RECOVERY  5:3591  175/90   +-------------------+-------------------------+-------------------------+------------+    Stress Duration:5:01 mm:ss  Max Stress H.R.:153 bpmTarget Heart Rate Achieved: Yes      ___________________________________________________________________________________________  WALL SEGMENT CHANGES    RestStress  Anterior Septum Basal:NormalHyperkinetic  ENI:[id number]   Apical:NormalHyperkinetic    Anterior Wall Basal:NormalHyperkinetic  GQQ:[id number]   Apical:NormalHyperkinetic     Lateral Wall Basal:NormalHyperkinetic  XIP:[id number]   Apical:NormalHyperkinetic    Posterior Wall Basal:NormalHyperkinetic  KWI:[id number]     Inferior Wall Basal:NormalHyperkinetic  QQI:[id number]   Apical:NormalHyperkinetic    Inferior Septum Basal:NormalHyperkinetic  GYJ:[id number]     Resting EF:>55% (Est.) Stress EF: >55% (Est.)      ___________________________________________________________________________________________  ADDITIONAL FINDINGS        ___________________________________________________________________________________________  STRESS ECG RESULTS     ECG Results:Normal    ___________________________________________________________________________________________    ECHOCARDIOGRAPHIC DESCRIPTIONS    LEFT VENTRICLE  Size:Normal  Contraction:Normal   LV Masses:No Masses   OYD:XAJO  Dias.FxClass:Normal    RIGHT VENTRICLE  Size:Normal Free Wall:Normal  Contraction:Normal RV Masses:No mass    PERICARDIUM   Fluid:No effusion    _______________________________________________________________________________________    DOPPLER ECHO and OTHER SPECIAL PROCEDURES   Aortic:No ARNo AS     Mitral:MILD MRNo MS  MV Inflow E Vel=nm*MV Annulus E'Vel=nm*  E/E'Ratio=nm*    Tricuspid:TRIVIAL TR No TS  207.0 cm/sec peak TR vel 22.1 mmHg peak RV pressure    Pulmonary:TRIVIAL PR No PS      ___________________________________________________________________________________________    ECHOCARDIOGRAPHIC MEASUREMENTS  2D DIMENSIONS  AORTA ValuesNormal RangeMAIN PAValuesNormal Range  Annulus:nm* [2.1 - 2.5]PA Main:nm* [1.5 - 2.1]   Aorta Sin:nm* [2.7 - 3.3] RIGHT VENTRICLE  ST Junction:nm* [2.3 - 2.9]RV Base:nm* [ < 4.2]  Asc.Aorta:nm* [2.3 - 3.1] RV Mid:nm* [ < 3.5]  LEFT VENTRICLERV Length:nm* [ < 8.6]  LVIDd:nm* [3.9 - 5.3] INFERIOR VENA CAVA  LVIDs:nm* Max. IVC:nm* [ <= 2.1]   FS:nm* [> 25]Min. IVC:nm*  SWT:nm* [0.5 - 0.9] ------------------  PWT:nm* [0.5 -  0.9] nm* - not measured  LEFT ATRIUM  LA Diam:nm* [2.7 - 3.8]  LA A4C Area:nm* [ < 20]  LA Volume:nm* [22 - 52]    ___________________________________________________________________________________________  INTERPRETATION  Normal Stress Echocardiogram  NORMAL RIGHT VENTRICULAR SYSTOLIC FUNCTION  MILD VALVULAR REGURGITATION (See above)  NO VALVULAR STENOSIS NOTED      ___________________________________________________________________________________________  Electronically signed by: MD Miquel Dunn on 03/26/2015 04:41 PM  Performed By: Johnathan Hausen, [place], RVT  Ordering Physician: Isaias Cowman    ___________________________________________________________________________________________    Echocardiogram stress test (03/25/2015 2:00 PM)  Procedure Note  Interface, Text Results In - 03/26/2015 4:42 PM EST  CARDIOLOGY DEPARTMENT [facility], [facility] [id number] [place] #: [id number] [address] [place], [place] [address] Date: 03/25/2015 01:37 PM Adult Female Age: 61 yrs ECHOCARDIOGRAM REPORT Outpatient KC::KCWC STUDY:Stress Echo TAPE: MD1: PARASCHOS,  ALEXANDER ECHO:Yes DOPPLER:Yes FILE:0000-000-000 BP: 153/88 mmHg COLOR:Yes CONTRAST:No MACHINE:Philips Height: 61 in RV BIOPSY:No 3D:No SOUND QLTY:Moderate Weight: 220 lb MEDIUM:None BSA: 2.0 m2  ___________________________________________________________________________________________ HISTORY:Chest pain REASON:Assess, LV function Indication:I20.8 Other forms of angina pectoris  ___________________________________________________________________________________________ STRESS ECHOCARDIOGRAPHY  Protocol:Treadmill (Bruce) Drugs:None Target Heart Rate:129 bpm Maximum Predicted Heart Rate: 152 bpm  +-------------------+-------------------------+-------------------------+------------+  Stage  Duration (mm:ss)  Heart Rate (bpm)  BP  +-------------------+-------------------------+-------------------------+------------+  RESTING   77  153/88  +-------------------+-------------------------+-------------------------+------------+  EXERCISE  5:01  153  /  +-------------------+-------------------------+-------------------------+------------+  RECOVERY  5:35  91  175/90  +-------------------+-------------------------+-------------------------+------------+  Stress Duration:5:01 mm:ss Max Stress H.R.:153 bpm Target Heart Rate Achieved: Yes   ___________________________________________________________________________________________ WALL SEGMENT CHANGES  Rest Stress Anterior Septum Basal:Normal Hyperkinetic RSW:NIOEVO Hyperkinetic Apical:Normal Hyperkinetic  Anterior Wall Basal:Normal Hyperkinetic JJK:KXFGHW Hyperkinetic Apical:Normal Hyperkinetic  Lateral Wall Basal:Normal Hyperkinetic EXH:BZJIRC Hyperkinetic Apical:Normal Hyperkinetic  Posterior Wall Basal:Normal Hyperkinetic VEL:FYBOFB Hyperkinetic  Inferior Wall Basal:Normal Hyperkinetic PZW:CHENID Hyperkinetic Apical:Normal Hyperkinetic  Inferior Septum Basal:Normal Hyperkinetic POE:UMPNTI  Hyperkinetic  Resting EF:>55% (Est.) Stress EF: >55% (Est.)   ___________________________________________________________________________________________ ADDITIONAL FINDINGS    ___________________________________________________________________________________________ STRESS ECG RESULTS  ECG Results:Normal  ___________________________________________________________________________________________  ECHOCARDIOGRAPHIC DESCRIPTIONS  LEFT VENTRICLE Size:Normal Contraction:Normal LV Masses:No Masses RWE:RXVQ Dias.FxClass:Normal  RIGHT VENTRICLE Size:Normal Free Wall:Normal Contraction:Normal RV Masses:No mass  PERICARDIUM Fluid:No effusion  _______________________________________________________________________________________  DOPPLER ECHO and OTHER SPECIAL PROCEDURES Aortic:No AR No AS  Mitral:MILD MR No MS MV Inflow E Vel=nm* MV Annulus E'Vel=nm* E/E'Ratio=nm*  Tricuspid:TRIVIAL TR No TS 207.0 cm/sec peak TR vel 22.1 mmHg peak RV pressure  Pulmonary:TRIVIAL PR No PS   ___________________________________________________________________________________________  ECHOCARDIOGRAPHIC MEASUREMENTS 2D DIMENSIONS AORTA Values Normal Range MAIN PA Values Normal Range Annulus: nm* [2.1 - 2.5] PA Main: nm* [1.5 - 2.1] Aorta Sin: nm* [2.7 - 3.3] RIGHT VENTRICLE ST Junction: nm* [2.3 - 2.9] RV Base: nm* [ < 4.2] Asc.Aorta: nm* [2.3 - 3.1] RV Mid: nm* [ < 3.5] LEFT VENTRICLE RV Length: nm* [ < 8.6] LVIDd: nm* [3.9 - 5.3] INFERIOR VENA CAVA LVIDs: nm* Max. IVC: nm* [ <= 2.1] FS: nm* [> 25] Min. IVC: nm* SWT: nm* [0.5 - 0.9] ------------------ PWT: nm* [0.5 - 0.9] nm* - not measured LEFT ATRIUM LA Diam: nm* [2.7 - 3.8] LA A4C Area: nm* [ < 20] LA Volume: nm* [22 - 52]  ___________________________________________________________________________________________ INTERPRETATION Normal Stress Echocardiogram NORMAL RIGHT VENTRICULAR SYSTOLIC FUNCTION MILD VALVULAR  REGURGITATION (See above) NO VALVULAR STENOSIS NOTED   ___________________________________________________________________________________________ Electronically signed by: MD Miquel Dunn on 03/26/2015 04:41 PM Performed By: Johnathan Hausen, RDCS, RVT Ordering Physician: Isaias Cowman  ___________________________________________________________________________________________   EKG Results - in this encounter  ECG stress test only (03/25/2015 1:31 PM) ECG stress test only (03/25/2015 1:31 PM)  Specimen Performing Laboratory   [facility]   Visit Diagnoses - in this encounter Diagnosis  Other forms of angina pectoris (CMS-HCC)  Document Information Service Providers Document Coverage Dates Mar. 08, 2017 - Mar. 08, 2017 [place] [phone] (Work) [place], [place] [address] Encounter Providers [place] CARD [place] 1 (Attending)

## 2015-04-02 NOTE — Patient Instructions (Signed)
  Your procedure is scheduled on: 04-10-15  Report to [facility] To find out your arrival time please call [phone] between 1PM - 3PM on 04-09-15  Remember: Instructions that are not followed completely may result in serious medical risk, up to and including death, or upon the discretion of your surgeon and anesthesiologist your surgery may need to be rescheduled.    _X___ 1. Do not eat food or drink liquids after midnight. No gum chewing or hard candies.     _X___ 2. No Alcohol for 24 hours before or after surgery.   ____ 3. Bring all medications with you on the day of surgery if instructed.    _X___ 4. Notify your doctor if there is any change in your medical condition     (cold, fever, infections).     Do not wear jewelry, make-up, hairpins, clips or nail polish.  Do not wear lotions, powders, or perfumes. You may wear deodorant.  Do not shave 48 hours prior to surgery. Men may shave face and neck.  Do not bring valuables to the hospital.    [facility] is not responsible for any belongings or valuables.               Contacts, dentures or bridgework may not be worn into surgery.  Leave your suitcase in the car. After surgery it may be brought to your room.  For patients admitted to the hospital, discharge time is determined by your treatment team.   Patients discharged the day of surgery will not be allowed to drive home.   Please read over the following fact sheets that you were given:    ____ Take these medicines the morning of surgery with A SIP OF WATER:    1. NONE  2.   3.   4.  5.  6.  ____ Fleet Enema (as directed)   ____ Use CHG Soap as directed  ____ Use inhalers on the day of surgery  ____ Stop metformin 2 days prior to surgery    ____ Take 1/2 of usual insulin dose the night before surgery and none on the morning of surgery.   ____ Stop Coumadin/Plavix/aspirin-OK TO CONTINUE 81 MG ASA PER DR BYRNETTS H&P  ____ Stop  Anti-inflammatories-DR BYRNETT LETS HIS PTS CONTINUE NSAIDS   _X___ Stop supplements until after surgery-STOP TURMERIC AND TART CHERRY EXTRACT NOW   ____ Bring C-Pap to the hospital.

## 2015-04-02 NOTE — Pre-Procedure Instructions (Signed)
Progress Notes - in this encounter Isaias Cowman, MD - 03/27/2015 10:30 AM EST Formatting of this note may be different from the original. Established Patient Visit   Chief Complaint: Chief Complaint  Patient presents with  . Follow-up  stress echo  Date of Service: 03/27/2015 Date of Birth: 1945-08-07 PCP: Ronette Deter, MD  History of Present Illness: Ms. Porcher is a 70 y.o.female patient who returns for evaluation of chest pain, and preoperative cardiovascular evaluation. The patient has gallstones, waiting for cholecystectomy, noted to have a systolic murmur on physical examination, with 2 month history of intermittent chest discomfort. The patient underwent stress echocardiogram 03/25/2015, exercise 5 minutes on a Bruce protocol without chest pain or ECG changes. Baseline echocardiogram revealed normal left ventricular function, with LVEF greater than 55%, with mild mitral regurgitation. At peak exercise there was no echocardiographic evidence for ischemia.  Past Medical and Surgical History  Past Medical History Past Medical History  Diagnosis Date  . Diabetes mellitus type 2, uncomplicated (CMS-HCC)  . GERD (gastroesophageal reflux disease)   Past Surgical History She has a past surgical history that includes Hysterectomy Total Abdominal W/Removal Tubes &/Or Ovaries.   Medications and Allergies  Current Medications  Current Outpatient Prescriptions  Medication Sig Dispense Refill  . acetaminophen (TYLENOL) 325 MG tablet Take by mouth.  Marland Kitchen aspirin 81 MG EC tablet Take by mouth.  . cholecalciferol (VITAMIN D3) 2,000 unit capsule Take by mouth.  . iron-vit C-vit B12-folic acid [phone] mg-mg-mcg-mg Tab Take by mouth.  . metFORMIN (GLUCOPHAGE) 500 MG tablet Take by mouth.  . naproxen (NAPROSYN) 500 MG tablet TK 1 T PO BID  . neomycin-polymyxin-dexamethasone (MAXITROL) ophthalmic suspension Reported on 03/24/2015 0  . ondansetron (ZOFRAN-ODT) 4 MG disintegrating tablet   . oxyCODONE-acetaminophen (PERCOCET) 5-325 mg tablet Reported on 03/24/2015 0  . pantoprazole (PROTONIX) 40 MG DR tablet Take by mouth.  Marland Kitchen rOPINIRole (REQUIP) 0.25 MG tablet Take by mouth.  . traMADol (ULTRAM) 50 mg tablet TAKE 1 TABLET BY MOUTH EVERY 8 HOURS AS NEEDED   No current facility-administered medications for this visit.   Allergies: Review of patient's allergies indicates no known allergies.  Social and Family History  Social History reports that she has never smoked. She does not have any smokeless tobacco history on file. She reports that she does not drink alcohol or use illicit drugs.  Family History Family History  Problem Relation Age of Onset  . Diabetes type II Mother  . Cancer Mother  . Cancer Father   Review of Systems   Review of Systems: The patient reports intermittent, nonexertional chest pain, without shortness of breath, orthopnea, paroxysmal nocturnal dyspnea, pedal edema, palpitations, heart racing, presyncope, syncope. Review of 12 Systems is negative except as described above.  Physical Examination   Vitals: Visit Vitals  . BP (P) 152/80  . Pulse (P) 70  . Ht (P) 154.9 cm (5' 1" )  . Wt (P) 98.9 kg (218 lb)  . BMI (P) 41.19 kg/m2   Ht:(P) 154.9 cm (5' 1" ) Wt:(P) 98.9 kg (218 lb) IRJ:JOAC surface area is 2.06 meters squared (pended). Body mass index is 41.19 kg/(m^2) (pended).  HEENT: Pupils equally reactive to light and accomodation  Neck: Supple without thyromegaly, carotid pulses 2+ Lungs: clear to auscultation bilaterally; no wheezes, rales, rhonchi Heart: Regular rate and rhythm. No gallops, murmurs or rub Abdomen: soft nontender, nondistended, with normal bowel sounds Extremities: no cyanosis, clubbing, or edema Peripheral Pulses: 2+ in all extremities, 2+ femoral pulses bilaterally  Assessment  70 y.o. female with  1. Other forms of angina pectoris (CMS-HCC)  2. Pure hypercholesterolemia  3. Preop cardiovascular exam    70 year old female awaiting cholecystectomy, with intermittent nonexertional chest pain, and systolic murmur, with stress echocardiogram revealing normal left ventricular function, mild mitral regurgitation, without evidence for ischemia. The patient should be at low risk for cardiovascular complications during cholecystectomy.  Plan   1. Continue current medications 2. Counseled patient about low-sodium diet 3. DASH diet printed instructions given to the patient 4. Defer cardiac catheterization 5. Proceed with cholecystectomy as planned 6. Return to clinic for follow-up in 4 months  No orders of the defined types were placed in this encounter.  Return in about 4 months (around 07/27/2015).  Isaias Cowman, MD    Plan of Treatment - as of this encounter Upcoming Encounters Upcoming Encounters  Date Type Specialty Care Team Description  07/30/2015 Office Visit Cardiology Isaias Cowman, MD  [place]  [facility]  [place], [place] [address]  [phone]  [phone] (Fax)    Visit Diagnoses - in this encounter Diagnosis  Other forms of angina pectoris (CMS-HCC) - Primary  Pure hypercholesterolemia  Preop cardiovascular exam  Pre-operative cardiovascular examination   Document Information Service Providers Document Coverage Dates Mar. 10, 2017 - Mar. 10, 2017 [place] [phone] (Work) [place], [place] [address] Encounter Providers Isaias Cowman MD (Attending) [phone] (Work) [phone] (Fax)  [place] [facility] [place], [place] [address]

## 2015-04-02 NOTE — H&P (Signed)
HPI Morgan Hurst is a 70 y.o. female here today for evaluation of gallstones. Patient states she is not having any abdominal pain just having vomiting and nausea for about two weeks. She denies any significant heartburn or regurgitation of solid or fluid. She reports that she has obtained relief making use of per tonics up to 3 times per day, describing her pain abating after about 45 minutes. Patient reports she is having a burning/stinging pain in her chest wall area, pointing to the area under her left breast. She does report significant more belching than baseline. to more belching. On the few occasions when she has had vomiting, she describes undigested food. No blood or mucus. Gas X has not helped her symptoms. Initially, her symptoms would develop late in the afternoon, now they are present throughout the day. There is no particular food that is triggering, now it seems almost all foods increase her discomfort. No previous history of intolerance of food.  Earlier this year she was told that her blood sugar was creeping up and she modified her diet and began walking program. This has resulted in a 15 pound weight loss.. Bowels move regular and no bleeding or mucus.. Denies fever or chills.  She started out having evening belching and heartburn but now she wakes up having the symptoms.  She started a weight loss regimen with walking in January and has lost about 15 pounds.  CT scan done 03-10-15 days prior identified gallstones.  The patient is accompanied today by her daughter.  She was widowed one year ago after 92 years of marriage. Her husband has suffered from Parkinson's developed pneumonia and did not recover. I personally reviewed the patient's history.  HPI  Past Medical History  Diagnosis Date  . Arthritis   . Endometriosis     Past Surgical History  Procedure Laterality Date  . Abdominal hysterectomy  age 75  . Ankle surgery    . Vaginal  delivery  2    Family History  Problem Relation Age of Onset  . Diabetes Mother   . Cancer Mother     colon and breast  . Cancer Father     Leukemia  . Cancer Brother     colon and lung  . Cholelithiasis Daughter   . Heart disease Paternal Grandfather   . Cancer Other 29    colon    Social History Social History  Substance Use Topics  . Smoking status: Never Smoker   . Smokeless tobacco: None  . Alcohol Use: No    No Known Allergies  Current Outpatient Prescriptions  Medication Sig Dispense Refill  . acetaminophen (TYLENOL) 325 MG tablet Take 650 mg by mouth every 6 (six) hours as needed.    Marland Kitchen aspirin 81 MG tablet Take 81 mg by mouth daily.    . Cholecalciferol (VITAMIN D) 2000 UNITS CAPS Take 1 capsule by mouth daily.    . Iron-Vit C-Vit B12-Folic Acid (IRON 706 PLUS PO) Take by mouth.    . metFORMIN (GLUCOPHAGE) 500 MG tablet Take 1 tablet (500 mg total) by mouth 2 (two) times daily with a meal. 180 tablet 3  . naproxen (NAPROSYN) 500 MG tablet TK 1 T PO BID  0  . pantoprazole (PROTONIX) 40 MG tablet Take 1 tablet (40 mg total) by mouth daily. 30 tablet 3  . rOPINIRole (REQUIP) 0.25 MG tablet Take 1 tablet (0.25 mg total) by mouth at bedtime. 30 tablet 3  . traMADol (ULTRAM) 50 MG tablet  TAKE 1 TABLET BY MOUTH EVERY 8 HOURS AS NEEDED 270 tablet 0  . ondansetron (ZOFRAN ODT) 4 MG disintegrating tablet Take 1 tablet (4 mg total) by mouth every 8 (eight) hours as needed for nausea or vomiting. 20 tablet 0   No current facility-administered medications for this visit.    Review of Systems Review of Systems  Constitutional: Negative. Negative for unexpected weight change.  HENT: Negative.  Eyes: Negative.  Respiratory: Negative.  Cardiovascular: Negative.  Gastrointestinal: Positive for nausea and vomiting. Negative for abdominal pain.    Endocrine: Negative.  Genitourinary: Negative.  Neurological: Negative.  Hematological: Negative.  Psychiatric/Behavioral: Negative.    Blood pressure 132/76, pulse 74, resp. rate 12, height 5' 1"  (1.549 m), weight 219 lb (99.338 kg).  Physical Exam Physical Exam  Constitutional: She is oriented to person, place, and time. She appears well-developed and well-nourished.  HENT:  Mouth/Throat: Oropharynx is clear and moist.  Eyes: Conjunctivae are normal. No scleral icterus.  Neck: Neck supple.  Cardiovascular: Normal rate and regular rhythm.  Murmur heard. Systolic murmur is present with a grade of 2/6  Pulses:  Femoral pulses are 2+ on the right side, and 2+ on the left side.  Dorsalis pedis pulses are 2+ on the right side, and 2+ on the left side.  No lower leg edema  Pulmonary/Chest: Effort normal and breath sounds normal.    Abdominal: Soft. Normal appearance and bowel sounds are normal. There is tenderness.  Tender left mid abdomen.  Neurological: She is oriented to person, place, and time.  Skin: Skin is warm and dry.  Psychiatric: Her behavior is normal.    Data Reviewed CT scan of the abdomen and pelvis dated March 10, 2015 was reviewed. Calcified gallstone in the neck of the gallbladder. No gallbladder wall thickening. No peri- cholecystic fluid. No gross gastric pathology. No significant hiatal hernia. Small indeterminate left lower lobe pulmonary nodule. Nonobstructing stone in the renal pelvis.  Laboratory studies from 03/10/2015 showed a normal CBC, comprehensive metabolic panel and lipase.  PCP notes of 03/10/2015.    Assessment    Atypical left chest pain.  Long-standing gallstone, unlikely source of present symptoms.    Plan    The patient has tramadol available for hip pain, and was encouraged to make use of this (she usually uses one half tablet) sees for relief of her pain if needed. A prescription for Zofran was sent to her  pharmacy.    The case will need to be reviewed with her primary care physician as there is no clear evidence of a biliary source for her pain. While it could be a very atypical presentation of biliary tract disease, I think a formal cardiac evaluation would be in order prior to elective cholecystectomy.    PCP: Ronette Deter A This information has been scribed by Karie Fetch [place].   Robert Bellow 03/20/2015, 3:57 PM   Expand All Collapse All   Patient called wanting to arrange gallbladder surgery. This patient states she received the okay from the cardiologist to proceed.   We did receive cardiac clearance from Dr. Saralyn Pilar at [facility] which states patient is a low risk for surgery.

## 2015-04-08 ENCOUNTER — Ambulatory Visit: Payer: Medicare Other | Admitting: Cardiology

## 2015-04-10 ENCOUNTER — Ambulatory Visit: Payer: Medicare Other | Admitting: Anesthesiology

## 2015-04-10 ENCOUNTER — Ambulatory Visit
Admission: RE | Admit: 2015-04-10 | Discharge: 2015-04-10 | Disposition: A | Discharge status: Home / Self Care | Payer: Medicare Other | Source: Ambulatory Visit | Attending: General Surgery | Admitting: General Surgery

## 2015-04-10 ENCOUNTER — Ambulatory Visit: Payer: Medicare Other

## 2015-04-10 ENCOUNTER — Encounter
Admission: RE | Disposition: A | Discharge status: Home / Self Care | Payer: Self-pay | Source: Ambulatory Visit | Attending: General Surgery

## 2015-04-10 ENCOUNTER — Encounter: Payer: Self-pay | Admitting: *Deleted

## 2015-04-10 DIAGNOSIS — M1990 Unspecified osteoarthritis, unspecified site: Secondary | ICD-10-CM | POA: Insufficient documentation

## 2015-04-10 DIAGNOSIS — Z7984 Long term (current) use of oral hypoglycemic drugs: Secondary | ICD-10-CM | POA: Diagnosis not present

## 2015-04-10 DIAGNOSIS — K8020 Calculus of gallbladder without cholecystitis without obstruction: Secondary | ICD-10-CM

## 2015-04-10 DIAGNOSIS — R7303 Prediabetes: Secondary | ICD-10-CM | POA: Diagnosis not present

## 2015-04-10 DIAGNOSIS — D649 Anemia, unspecified: Secondary | ICD-10-CM | POA: Insufficient documentation

## 2015-04-10 DIAGNOSIS — R011 Cardiac murmur, unspecified: Secondary | ICD-10-CM | POA: Diagnosis not present

## 2015-04-10 DIAGNOSIS — K8044 Calculus of bile duct with chronic cholecystitis without obstruction: Secondary | ICD-10-CM | POA: Diagnosis not present

## 2015-04-10 DIAGNOSIS — Z713 Dietary counseling and surveillance: Secondary | ICD-10-CM | POA: Insufficient documentation

## 2015-04-10 DIAGNOSIS — Z7982 Long term (current) use of aspirin: Secondary | ICD-10-CM | POA: Diagnosis not present

## 2015-04-10 DIAGNOSIS — K8010 Calculus of gallbladder with chronic cholecystitis without obstruction: Secondary | ICD-10-CM | POA: Diagnosis not present

## 2015-04-10 DIAGNOSIS — Z90710 Acquired absence of both cervix and uterus: Secondary | ICD-10-CM | POA: Diagnosis not present

## 2015-04-10 DIAGNOSIS — Z79899 Other long term (current) drug therapy: Secondary | ICD-10-CM | POA: Insufficient documentation

## 2015-04-10 DIAGNOSIS — E669 Obesity, unspecified: Secondary | ICD-10-CM | POA: Diagnosis not present

## 2015-04-10 HISTORY — PX: CHOLECYSTECTOMY: SHX55

## 2015-04-10 HISTORY — DX: Prediabetes: R73.03

## 2015-04-10 HISTORY — DX: Adverse effect of unspecified anesthetic, initial encounter: T41.45XA

## 2015-04-10 HISTORY — DX: Cardiac murmur, unspecified: R01.1

## 2015-04-10 HISTORY — DX: Other complications of anesthesia, initial encounter: T88.59XA

## 2015-04-10 LAB — GLUCOSE, CAPILLARY
GLUCOSE-CAPILLARY: 130 mg/dL — AB (ref 65–99)
Glucose-Capillary: 86 mg/dL (ref 65–99)

## 2015-04-10 SURGERY — LAPAROSCOPIC CHOLECYSTECTOMY WITH INTRAOPERATIVE CHOLANGIOGRAM
Anesthesia: General | Wound class: Clean Contaminated

## 2015-04-10 MED ORDER — TRAMADOL HCL 50 MG PO TABS: 50.0000 mg | ORAL_TABLET | Freq: Four times a day (QID) | ORAL | Status: DC

## 2015-04-10 MED ORDER — ACETAMINOPHEN 10 MG/ML IV SOLN
INTRAVENOUS | Status: DC | PRN
  Administered 2015-04-10: 1000 mg via INTRAVENOUS

## 2015-04-10 MED ORDER — DEXAMETHASONE SODIUM PHOSPHATE 10 MG/ML IJ SOLN
INTRAMUSCULAR | Status: DC | PRN
  Administered 2015-04-10: 10 mg via INTRAVENOUS

## 2015-04-10 MED ORDER — FAMOTIDINE 20 MG PO TABS
ORAL_TABLET | ORAL | Status: AC
  Filled 2015-04-10: qty 1

## 2015-04-10 MED ORDER — KETOROLAC TROMETHAMINE 30 MG/ML IJ SOLN
INTRAMUSCULAR | Status: DC | PRN
  Administered 2015-04-10: 30 mg via INTRAVENOUS

## 2015-04-10 MED ORDER — NEOSTIGMINE METHYLSULFATE 10 MG/10ML IV SOLN
INTRAVENOUS | Status: DC | PRN
  Administered 2015-04-10: 3 mg via INTRAVENOUS

## 2015-04-10 MED ORDER — LIDOCAINE HCL (CARDIAC) 20 MG/ML IV SOLN
INTRAVENOUS | Status: DC | PRN
  Administered 2015-04-10: 60 mg via INTRAVENOUS

## 2015-04-10 MED ORDER — CEFAZOLIN SODIUM-DEXTROSE 2-4 GM/100ML-% IV SOLN
2.0000 g | INTRAVENOUS | Status: AC
  Administered 2015-04-10: 2 g via INTRAVENOUS
  Filled 2015-04-10: qty 100

## 2015-04-10 MED ORDER — ROCURONIUM BROMIDE 100 MG/10ML IV SOLN
INTRAVENOUS | Status: DC | PRN
  Administered 2015-04-10: 30 mg via INTRAVENOUS

## 2015-04-10 MED ORDER — CEFAZOLIN SODIUM-DEXTROSE 2-3 GM-% IV SOLR
INTRAVENOUS | Status: AC
  Filled 2015-04-10: qty 50

## 2015-04-10 MED ORDER — FENTANYL CITRATE (PF) 100 MCG/2ML IJ SOLN
INTRAMUSCULAR | Status: AC
  Administered 2015-04-10: 25 ug via INTRAVENOUS
  Filled 2015-04-10: qty 2

## 2015-04-10 MED ORDER — GLYCOPYRROLATE 0.2 MG/ML IJ SOLN
INTRAMUSCULAR | Status: DC | PRN
  Administered 2015-04-10: 0.6 mg via INTRAVENOUS

## 2015-04-10 MED ORDER — SODIUM CHLORIDE 0.9 % IJ SOLN
INTRAMUSCULAR | Status: AC
  Filled 2015-04-10: qty 50

## 2015-04-10 MED ORDER — PROPOFOL 10 MG/ML IV BOLUS
INTRAVENOUS | Status: DC | PRN
  Administered 2015-04-10: 150 mg via INTRAVENOUS

## 2015-04-10 MED ORDER — FENTANYL CITRATE (PF) 100 MCG/2ML IJ SOLN
INTRAMUSCULAR | Status: DC | PRN
  Administered 2015-04-10: 50 ug via INTRAVENOUS

## 2015-04-10 MED ORDER — LACTATED RINGERS IV SOLN
INTRAVENOUS | Status: DC | PRN
  Administered 2015-04-10: 11:00:00 via INTRAVENOUS

## 2015-04-10 MED ORDER — SODIUM CHLORIDE 0.9 % IV SOLN
INTRAVENOUS | Status: DC
  Administered 2015-04-10: 11:00:00 via INTRAVENOUS

## 2015-04-10 MED ORDER — FENTANYL CITRATE (PF) 100 MCG/2ML IJ SOLN
25.0000 ug | INTRAMUSCULAR | Status: AC | PRN
  Administered 2015-04-10 (×6): 25 ug via INTRAVENOUS

## 2015-04-10 MED ORDER — HYDROMORPHONE HCL 1 MG/ML IJ SOLN
INTRAMUSCULAR | Status: DC | PRN
  Administered 2015-04-10: 0.5 mg via INTRAVENOUS

## 2015-04-10 MED ORDER — SODIUM CHLORIDE 0.9 % IV SOLN
INTRAVENOUS | Status: DC | PRN
  Administered 2015-04-10: 10 mL

## 2015-04-10 MED ORDER — ACETAMINOPHEN 10 MG/ML IV SOLN
INTRAVENOUS | Status: AC
  Filled 2015-04-10: qty 100

## 2015-04-10 MED ORDER — LACTATED RINGERS IR SOLN
Status: DC | PRN
  Administered 2015-04-10: 500 mL

## 2015-04-10 MED ORDER — MIDAZOLAM HCL 2 MG/2ML IJ SOLN
INTRAMUSCULAR | Status: DC | PRN
  Administered 2015-04-10: 1 mg via INTRAVENOUS

## 2015-04-10 MED ORDER — ONDANSETRON HCL 4 MG/2ML IJ SOLN
INTRAMUSCULAR | Status: DC | PRN
  Administered 2015-04-10: 4 mg via INTRAVENOUS

## 2015-04-10 MED ORDER — FAMOTIDINE 20 MG PO TABS
20.0000 mg | ORAL_TABLET | Freq: Once | ORAL | Status: AC
  Administered 2015-04-10: 20 mg via ORAL

## 2015-04-10 MED ORDER — ONDANSETRON HCL 4 MG/2ML IJ SOLN: 4.0000 mg | Freq: Once | INTRAMUSCULAR | Status: DC | PRN

## 2015-04-10 SURGICAL SUPPLY — 41 items
APPLIER CLIP ROT 10 11.4 M/L (STAPLE) ×2
BENZOIN TINCTURE PRP APPL 2/3 (GAUZE/BANDAGES/DRESSINGS) ×2 IMPLANT
BLADE SURG 11 STRL SS SAFETY (MISCELLANEOUS) ×2 IMPLANT
CANISTER SUCT 1200ML W/VALVE (MISCELLANEOUS) ×2 IMPLANT
CANNULA DILATOR 10 W/SLV (CANNULA) ×2 IMPLANT
CANNULA DILATOR 5 W/SLV (CANNULA) ×4 IMPLANT
CATH CHOLANG 76X19 KUMAR (CATHETERS) ×2 IMPLANT
CHLORAPREP W/TINT 26ML (MISCELLANEOUS) ×2 IMPLANT
CLIP APPLIE ROT 10 11.4 M/L (STAPLE) ×1 IMPLANT
CONRAY 60ML FOR OR (MISCELLANEOUS) ×2 IMPLANT
DISSECTOR KITTNER STICK (MISCELLANEOUS) IMPLANT
DISSECTORS/KITTNER STICK (MISCELLANEOUS)
DRAPE SHEET LG 3/4 BI-LAMINATE (DRAPES) ×2 IMPLANT
DRESSING TELFA 4X3 1S ST N-ADH (GAUZE/BANDAGES/DRESSINGS) ×2 IMPLANT
DRSG TEGADERM 2-3/8X2-3/4 SM (GAUZE/BANDAGES/DRESSINGS) ×2 IMPLANT
DRSG TELFA 3X8 NADH (GAUZE/BANDAGES/DRESSINGS) ×2 IMPLANT
ELECT REM PT RETURN 9FT ADLT (ELECTROSURGICAL) ×2
ELECTRODE REM PT RTRN 9FT ADLT (ELECTROSURGICAL) ×1 IMPLANT
ENDOPOUCH RETRIEVER 10 (MISCELLANEOUS) IMPLANT
GLOVE BIO SURGEON STRL SZ7.5 (GLOVE) ×8 IMPLANT
GLOVE INDICATOR 8.0 STRL GRN (GLOVE) ×8 IMPLANT
GOWN STRL REUS W/ TWL LRG LVL3 (GOWN DISPOSABLE) ×4 IMPLANT
GOWN STRL REUS W/TWL LRG LVL3 (GOWN DISPOSABLE) ×4
GRASPER SUT TROCAR 14GX15 (MISCELLANEOUS) ×2 IMPLANT
IRRIGATION STRYKERFLOW (MISCELLANEOUS) ×1 IMPLANT
IRRIGATOR STRYKERFLOW (MISCELLANEOUS) ×2
IV LACTATED RINGERS 1000ML (IV SOLUTION) ×2 IMPLANT
KIT RM TURNOVER STRD PROC AR (KITS) ×2 IMPLANT
LABEL OR SOLS (LABEL) ×2 IMPLANT
NDL INSUFF ACCESS 14 VERSASTEP (NEEDLE) ×2 IMPLANT
NS IRRIG 500ML POUR BTL (IV SOLUTION) ×2 IMPLANT
PACK LAP CHOLECYSTECTOMY (MISCELLANEOUS) ×2 IMPLANT
SCISSORS METZENBAUM CVD 33 (INSTRUMENTS) ×2 IMPLANT
SEAL FOR SCOPE WARMER C3101 (MISCELLANEOUS) ×2 IMPLANT
STRIP CLOSURE SKIN 1/2X4 (GAUZE/BANDAGES/DRESSINGS) ×2 IMPLANT
SUT VIC AB 0 CT2 27 (SUTURE) ×4 IMPLANT
SUT VIC AB 4-0 FS2 27 (SUTURE) ×2 IMPLANT
SWABSTK COMLB BENZOIN TINCTURE (MISCELLANEOUS) ×2 IMPLANT
TROCAR XCEL NON-BLD 11X100MML (ENDOMECHANICALS) ×2 IMPLANT
TUBING INSUFFLATOR HI FLOW (MISCELLANEOUS) ×2 IMPLANT
WATER STERILE IRR 1000ML POUR (IV SOLUTION) ×2 IMPLANT

## 2015-04-10 NOTE — Transfer of Care (Signed)
Immediate Anesthesia Transfer of Care Note  Patient: Morgan Hurst  Procedure(s) Performed: Procedure(s): LAPAROSCOPIC CHOLECYSTECTOMY WITH INTRAOPERATIVE CHOLANGIOGRAM (N/A)  Patient Location: PACU  Anesthesia Type:General  Level of Consciousness: awake, alert  and oriented  Airway & Oxygen Therapy: Patient Spontanous Breathing and Patient connected to nasal cannula oxygen  Post-op Assessment: Report given to RN and Post -op Vital signs reviewed and stable  Post vital signs: Reviewed and stable  Last Vitals:  Filed Vitals:   04/10/15 1046 04/10/15 1238  BP: 128/66 119/60  Pulse: 76 72  Temp: 36.4 C 36.8 C  Resp: 18 16    Complications: No apparent anesthesia complications

## 2015-04-10 NOTE — H&P (Signed)
Morgan Hurst [id number] 1945-01-23     HPI:  Patient with atypical chest/ abdominal pain and incidental identification of gallstones on CT. Symptoms have continued, and cardiac evaluation shows no source for her symptoms. We have elected to proceed to cholecystectomy.  She is aware that there is no guarantee that this procedure will relieve her symptoms.   Prescriptions prior to admission  Medication Sig Dispense Refill Last Dose  . acetaminophen (TYLENOL) 325 MG tablet Take 650 mg by mouth every 6 (six) hours as needed.   Taking  . aspirin 81 MG tablet Take 81 mg by mouth daily.   Taking  . Cholecalciferol (VITAMIN D) 2000 UNITS CAPS Take 1 capsule by mouth daily.   Taking  . cyanocobalamin (,VITAMIN B-12,) 1000 MCG/ML injection Inject 1,000 mcg into the muscle every 30 (thirty) days.     Marland Kitchen ibuprofen (ADVIL,MOTRIN) 200 MG tablet Take 200 mg by mouth every 6 (six) hours as needed.     . Iron-Vit C-Vit B12-Folic Acid (IRON 832 PLUS PO) Take 1 tablet by mouth 2 (two) times a week.    Taking  . meloxicam (MOBIC) 15 MG tablet Take 15 mg by mouth as needed for pain.     . metFORMIN (GLUCOPHAGE) 500 MG tablet Take 1 tablet (500 mg total) by mouth 2 (two) times daily with a meal. 180 tablet 3 Taking  . ondansetron (ZOFRAN ODT) 4 MG disintegrating tablet Take 1 tablet (4 mg total) by mouth every 8 (eight) hours as needed for nausea or vomiting. 20 tablet 0   . OVER THE COUNTER MEDICATION Take 1 tablet by mouth as needed. TART CHERRY EXTRACT     . rOPINIRole (REQUIP) 0.25 MG tablet Take 1 tablet (0.25 mg total) by mouth at bedtime. (Patient taking differently: Take 0.25 mg by mouth 3 times/day as needed-between meals & bedtime. ) 30 tablet 3 Taking  . traMADol (ULTRAM) 50 MG tablet TAKE 1 TABLET BY MOUTH EVERY 8 HOURS AS NEEDED 270 tablet 0 Taking  . TURMERIC PO Take 1 tablet by mouth daily.      No Known Allergies Past Medical History  Diagnosis Date  . Arthritis   . Endometriosis   . Heart  murmur   . Complication of anesthesia     FIGHTING WHEN WAKING UP FROM ANESTHESIA  . Borderline diabetes     PCP STARTED PT ON METFORMIN IN 2016 DUE TO ELEVATED GLUCOSE    Past Surgical History  Procedure Laterality Date  . Abdominal hysterectomy  age 33  . Ankle surgery    . Vaginal delivery  2   Social History   Social History  . Marital Status: Widowed    Spouse Name: N/A  . Number of Children: N/A  . Years of Education: N/A   Occupational History  . Not on file.   Social History Main Topics  . Smoking status: Never Smoker   . Smokeless tobacco: Not on file  . Alcohol Use: No  . Drug Use: No  . Sexual Activity: Not on file   Other Topics Concern  . Not on file   Social History Narrative   Lives with husband in [place], dog outside.      Work - retired from [employer] - regular   Exercise - walks occasionally, limited by fatigue   Social History   Social History Narrative   Lives with husband in [place].  Work - retired from [employer] - regular   Exercise - walks occasionally, limited by fatigue     ROS: Negative.     PE: HEENT: Negative. Lungs: Clear. Cardio: RR.II/VI SM.  Morgan Hurst 04/10/2015   Assessment/Plan:  Proceed with cholecystectomy.

## 2015-04-10 NOTE — Discharge Instructions (Signed)

## 2015-04-10 NOTE — Anesthesia Postprocedure Evaluation (Signed)
Anesthesia Post Note  Patient: Morgan Hurst  Procedure(s) Performed: Procedure(s) (LRB): LAPAROSCOPIC CHOLECYSTECTOMY WITH INTRAOPERATIVE CHOLANGIOGRAM (N/A)  Patient location during evaluation: PACU Anesthesia Type: General Level of consciousness: awake Pain management: pain level controlled Vital Signs Assessment: post-procedure vital signs reviewed and stable Respiratory status: spontaneous breathing Cardiovascular status: blood pressure returned to baseline Anesthetic complications: no    Last Vitals:  Filed Vitals:   04/10/15 1046 04/10/15 1238  BP: 128/66 119/60  Pulse: 76 72  Temp: 36.4 C 36.8 C  Resp: 18 16    Last Pain:  Filed Vitals:   04/10/15 1243  PainSc: Asleep                 VAN STAVEREN,Brande Uncapher

## 2015-04-10 NOTE — Anesthesia Preprocedure Evaluation (Signed)
Anesthesia Evaluation  Patient identified by MRN, date of birth, ID band Patient awake    Reviewed: Allergy & Precautions, NPO status , Patient's Chart, lab work & pertinent test results  Airway Mallampati: III  TM Distance: <3 FB Neck ROM: Limited    Dental  (+) Teeth Intact, Caps   Pulmonary neg pulmonary ROS,     + decreased breath sounds      Cardiovascular Exercise Tolerance: Good  Rhythm:Regular Rate:Normal     Neuro/Psych    GI/Hepatic negative GI ROS, Neg liver ROS,   Endo/Other  diabetes, Type 2, Oral Hypoglycemic AgentsMorbid obesity  Renal/GU negative Renal ROS     Musculoskeletal   Abdominal (+) + obese,   Peds  Hematology  (+) anemia ,   Anesthesia Other Findings   Reproductive/Obstetrics                             Anesthesia Physical Anesthesia Plan  ASA: III  Anesthesia Plan: General   Post-op Pain Management:    Induction: Intravenous  Airway Management Planned: Oral ETT  Additional Equipment:   Intra-op Plan:   Post-operative Plan:   Informed Consent: I have reviewed the patients History and Physical, chart, labs and discussed the procedure including the risks, benefits and alternatives for the proposed anesthesia with the patient or authorized representative who has indicated his/her understanding and acceptance.     Plan Discussed with: CRNA  Anesthesia Plan Comments:         Anesthesia Quick Evaluation

## 2015-04-10 NOTE — Op Note (Signed)
Preoperative diagnosis: Cholecystitis and cholelithiasis.  Postoperative diagnosis: Same.  Operative procedure: Laparoscopic cholecystectomy with intraoperative cholangiograms.  Operative surgeon: Rosanne Ashing, M.D.  Anesthesia: Gen. endotracheal.  Estimate blood loss: 5 mL.  Clinical note: This 68 minutes had atypical left-sided abdominal pain and epigastric pain as well as some rare episodes of right upper quadrant pain. CT scan showed evidence of cholelithiasis. No evidence of acute cholecystitis. Cardiac evaluation was negative. Cholecystectomy is planned to see if this will improve her abdominal/chest symptoms. No guarantee towards relief of symptoms has been provided.  Operative note: The patient received Kefzol prior to procedure because of her diabetes. She had SCD stockings for DVT prevention. She underwent general endotracheal anesthesia without difficulty.  The abdomen was prepped with ChloraPrep and draped. An Trendelenburg position a varies needle was placed with trans-umbilical incision. After assuring intra-abdominal location with a hanging drop test the abdomen was insufflated with CO2 a 10 mmHg pressure. A 10 mm step port was expanded. Inspection showed no evidence of injury from initial port placement. The patient was placed in reverse Trendelenburg position and rolled to the left. An 11 mm XL port was placed of the epigastrium. 25 mm Ports were placed in the right lateral abdominal wall. The gallbladder showed some mild changes of chronic cholecystitis and there was inflammation in the cystic duct lymph node. The gallbladder was placed on cephalad traction. The neck of the gallbladder was cleared. Fluoroscopic cholangiograms were completed using 10 mL of one half strength Conray 60. Prompt filling of the right and left hepatic ducts were noted. Free flow to the duodenum. The cystic duct and branches of the cystic artery were doubly clipped and divided. The gallbladder is removed  from the liver bed making use of hook cautery dissection. It was delivered to the umbilical port site. It was necessary to expand the fascial incision to allow extraction of the sizable stone. After reestablishing pneumoperitoneum the right upper quadrant was irrigated with lactated Ringer solution. Good hemostasis was noted. The camera was moved to the epigastric site and inspection showed no evidence of injury from initial port placement. A cone as well as suture passer was used to place a 0 Vicryl suture through the umbilical fascia to close this defect. The abdomen was then irrigated a final time and desufflated under direct vision. Skin incisions were closed with 4-0 Vicryl septic sutures. Benzoin, Steri-Strips, Telfa and Tegaderm dressings were applied.  The patient tolerated the procedure well and was taken to recovery in stable condition.

## 2015-04-10 NOTE — Anesthesia Procedure Notes (Signed)
Procedure Name: Intubation Date/Time: 04/10/2015 11:26 AM Performed by: [facility] Pre-anesthesia Checklist: Patient identified, Emergency Drugs available, Suction available and Patient being monitored Patient Re-evaluated:Patient Re-evaluated prior to inductionOxygen Delivery Method: Circle system utilized Preoxygenation: Pre-oxygenation with 100% oxygen Intubation Type: IV induction Ventilation: Mask ventilation without difficulty Laryngoscope Size: Mac and 3 Grade View: Grade I Tube type: Oral Airway Equipment and Method: Stylet Placement Confirmation: ETT inserted through vocal cords under direct vision,  positive ETCO2,  CO2 detector and breath sounds checked- equal and bilateral Secured at: 21 cm Tube secured with: Tape Dental Injury: Teeth and Oropharynx as per pre-operative assessment

## 2015-04-13 ENCOUNTER — Encounter: Payer: Self-pay | Admitting: General Surgery

## 2015-04-13 LAB — SURGICAL PATHOLOGY

## 2015-04-15 ENCOUNTER — Telehealth: Payer: Self-pay | Admitting: Internal Medicine

## 2015-04-15 DIAGNOSIS — E119 Type 2 diabetes mellitus without complications: Secondary | ICD-10-CM

## 2015-04-15 NOTE — Telephone Encounter (Signed)
Pt coming in on 04/21/15 for lab appt. Need order placed please

## 2015-04-15 NOTE — Telephone Encounter (Signed)
Pt's last A1C was done 11/27/14. Is it time for the pt to get another lab done? Please advise, thanks

## 2015-04-15 NOTE — Telephone Encounter (Signed)
Pt need order for A1c labs. Need orders please and thank you! Call pt  [phone].

## 2015-04-15 NOTE — Telephone Encounter (Signed)
Yes, please have her come in for A1c

## 2015-04-21 ENCOUNTER — Ambulatory Visit (INDEPENDENT_AMBULATORY_CARE_PROVIDER_SITE_OTHER): Payer: Medicare Other | Admitting: General Surgery

## 2015-04-21 ENCOUNTER — Ambulatory Visit: Payer: Medicare Other

## 2015-04-21 ENCOUNTER — Other Ambulatory Visit (INDEPENDENT_AMBULATORY_CARE_PROVIDER_SITE_OTHER): Payer: Medicare Other

## 2015-04-21 ENCOUNTER — Ambulatory Visit (INDEPENDENT_AMBULATORY_CARE_PROVIDER_SITE_OTHER): Payer: Medicare Other

## 2015-04-21 VITALS — BP 138/82 | Ht 60.0 in | Wt 209.0 lb

## 2015-04-21 DIAGNOSIS — E538 Deficiency of other specified B group vitamins: Secondary | ICD-10-CM

## 2015-04-21 DIAGNOSIS — R142 Eructation: Secondary | ICD-10-CM

## 2015-04-21 DIAGNOSIS — R079 Chest pain, unspecified: Secondary | ICD-10-CM

## 2015-04-21 DIAGNOSIS — E119 Type 2 diabetes mellitus without complications: Secondary | ICD-10-CM

## 2015-04-21 LAB — COMPREHENSIVE METABOLIC PANEL
ALT: 16 U/L (ref 0–35)
AST: 17 U/L (ref 0–37)
Albumin: 3.8 g/dL (ref 3.5–5.2)
Alkaline Phosphatase: 68 U/L (ref 39–117)
BUN: 12 mg/dL (ref 6–23)
CALCIUM: 9.9 mg/dL (ref 8.4–10.5)
CHLORIDE: 106 meq/L (ref 96–112)
CO2: 30 meq/L (ref 19–32)
Creatinine, Ser: 0.66 mg/dL (ref 0.40–1.20)
GFR: 94.41 mL/min (ref >60.00)
GLUCOSE: 100 mg/dL — AB (ref 70–99)
Potassium: 4 mEq/L (ref 3.5–5.1)
Sodium: 143 mEq/L (ref 135–145)
Total Bilirubin: 0.5 mg/dL (ref 0.2–1.2)
Total Protein: 7.3 g/dL (ref 6.0–8.3)

## 2015-04-21 LAB — HEMOGLOBIN A1C: HEMOGLOBIN A1C: 6 % (ref 4.6–6.5)

## 2015-04-21 MED ORDER — METOCLOPRAMIDE HCL 5 MG PO TABS: 5.0000 mg | ORAL_TABLET | Freq: Three times a day (TID) | ORAL | Status: DC

## 2015-04-21 MED ORDER — CYANOCOBALAMIN 1000 MCG/ML IJ SOLN
1000.0000 ug | Freq: Once | INTRAMUSCULAR | Status: AC
  Administered 2015-04-21: 1000 ug via INTRAMUSCULAR

## 2015-04-21 NOTE — Progress Notes (Signed)
Patient came into office for a B12 injection in Left Deltoid. Patient tolerated well.

## 2015-04-21 NOTE — Progress Notes (Signed)
Patient ID: Morgan Hurst, female   DOB: 02-20-1945, 70 y.o.   MRN: [id number]  Chief Complaint  Patient presents with  . Routine Post Op    gallbladder    HPI Morgan Hurst is a 70 y.o. female here today for her post op gallbladder removal done on 04/10/15. She states she is still having belching and heartburn.She reports that for the first 2 days after surgery she had no belching but this then recurred on postoperative day 3. While perhaps less frequent or intense that severity, and the associated burning sensation deep in the left chest that she describes is less severe, it is frequently troublesome throughout the day. She believes that she obtains relief by holding her arms over her head and encouraging belching to release pressure. HPI  Past Medical History  Diagnosis Date  . Arthritis   . Endometriosis   . Heart murmur   . Complication of anesthesia     FIGHTING WHEN WAKING UP FROM ANESTHESIA  . Borderline diabetes     PCP STARTED PT ON METFORMIN IN 2016 DUE TO ELEVATED GLUCOSE     Past Surgical History  Procedure Laterality Date  . Abdominal hysterectomy  age 33  . Ankle surgery    . Vaginal delivery  2  . Cholecystectomy N/A 04/10/2015    Procedure: LAPAROSCOPIC CHOLECYSTECTOMY WITH INTRAOPERATIVE CHOLANGIOGRAM;  Surgeon: Robert Bellow, MD;  Location: [facility] ORS;  Service: General;  Laterality: N/A;    Family History  Problem Relation Age of Onset  . Diabetes Mother   . Cancer Mother     colon and breast  . Cancer Father     Leukemia  . Cancer Brother     colon and lung  . Cholelithiasis Daughter   . Heart disease Paternal Grandfather   . Cancer Other 8    colon    Social History Social History  Substance Use Topics  . Smoking status: Never Smoker   . Smokeless tobacco: Not on file  . Alcohol Use: No    No Known Allergies  Current Outpatient Prescriptions  Medication Sig Dispense Refill  . acetaminophen (TYLENOL) 325 MG tablet Take by mouth.     Marland Kitchen aspirin 81 MG tablet Take 81 mg by mouth daily.    . Cholecalciferol (VITAMIN D3) 2000 units capsule Take by mouth.    . cyanocobalamin (,VITAMIN B-12,) 1000 MCG/ML injection Inject 1,000 mcg into the muscle every 30 (thirty) days.    . Iron-Vit C-Vit B12-Folic Acid 169-678-9.381-0 MG TABS Take by mouth.    . meloxicam (MOBIC) 15 MG tablet Take 15 mg by mouth as needed for pain.    . metFORMIN (GLUCOPHAGE) 500 MG tablet Take by mouth.    . naproxen (NAPROSYN) 500 MG tablet     . neomycin-polymyxin b-dexamethasone (MAXITROL) 3.5-10000-0.1 SUSP     . ondansetron (ZOFRAN ODT) 4 MG disintegrating tablet Take 1 tablet (4 mg total) by mouth every 8 (eight) hours as needed for nausea or vomiting. 20 tablet 0  . OVER THE COUNTER MEDICATION Take 1 tablet by mouth as needed. TART CHERRY EXTRACT    . oxyCODONE-acetaminophen (PERCOCET/ROXICET) 5-325 MG tablet     . pantoprazole (PROTONIX) 40 MG tablet Take by mouth.    Marland Kitchen rOPINIRole (REQUIP) 0.25 MG tablet Take by mouth.    . traMADol (ULTRAM) 50 MG tablet TAKE 1 TABLET BY MOUTH EVERY 8 HOURS AS NEEDED 270 tablet 0  . traMADol (ULTRAM) 50 MG tablet Take 1 tablet (50  mg total) by mouth 4 (four) times daily. 30 tablet 0  . TURMERIC PO Take 1 tablet by mouth daily.    . metoCLOPramide (REGLAN) 5 MG tablet Take 1 tablet (5 mg total) by mouth 3 (three) times daily before meals. 30 tablet 0   No current facility-administered medications for this visit.    Review of Systems Review of Systems  Constitutional: Negative.   Respiratory: Negative.   Cardiovascular: Negative.     Blood pressure 138/82, height 5' (1.524 m), weight 209 lb (94.802 kg).  Physical Exam Physical Exam  Constitutional: She is oriented to person, place, and time. She appears well-developed and well-nourished.  Eyes: Conjunctivae are normal. No scleral icterus.  Neck: Neck supple.  Cardiovascular: Normal rate, regular rhythm and normal heart sounds.   Pulmonary/Chest: Effort  normal and breath sounds normal.    Abdominal: Soft. Normal appearance and bowel sounds are normal. There is no hepatomegaly. There is no tenderness.  Port sites are clean and healing well.  Lymphadenopathy:    She has no cervical adenopathy.  Neurological: She is alert and oriented to person, place, and time.  Skin: Skin is warm and dry.    Data Reviewed GALLBLADDER; CHOLECYSTECTOMY:  - CHRONIC CHOLECYSTITIS WITH CHOLELITHIASIS.  - REACTIVE CYSTIC DUCT LYMPH NODE.  - NEGATIVE FOR MALIGNANCY.   CT ABDOMEN/ PELVIS: March 10, 2015  IMPRESSION: Cholelithiasis.  Left nephrolithiasis  9 mm left lower lobe sub solid nodule. Initial follow-up by chest CT without contrast is recommended in 3 months to confirm persistence. This recommendation follows the consensus statement: Recommendations for the Management of Subsolid Pulmonary Nodules Detected at CT: A Statement from the [place] as published in Radiology 2013; 266:304-317.  Sigmoid diverticulosis.  The scan was independently reviewed. No evidence of hiatal hernia, gastric wall thickening or esophagitis.    Assessment    The patient symptoms are minimally improved post cholecystectomy for chronic cholelithiasis.    Plan    I've asked her to make use of a trial of Reglan 5 mg 3 times a day. If gastric emptying is an issue this should improve it and would direct further therapy. If she shows no improvement, GI evaluation for upper endoscopy would be appropriate.  The patient appears far more greatly troubled by belching then one would normally associate with this annoying but not life-threatening condition.    PCP:  Ronette Deter A This information has been scribed by Gaspar Cola CMA.     Robert Bellow 04/21/2015, 4:02 PM

## 2015-04-21 NOTE — Patient Instructions (Signed)
Patient to return as needed. 

## 2015-04-29 ENCOUNTER — Ambulatory Visit (INDEPENDENT_AMBULATORY_CARE_PROVIDER_SITE_OTHER): Payer: Medicare Other | Admitting: Internal Medicine

## 2015-04-29 ENCOUNTER — Encounter: Payer: Self-pay | Admitting: Internal Medicine

## 2015-04-29 VITALS — BP 132/82 | HR 75 | Temp 97.9°F | Ht 62.0 in | Wt 210.5 lb

## 2015-04-29 DIAGNOSIS — R142 Eructation: Secondary | ICD-10-CM | POA: Diagnosis not present

## 2015-04-29 MED ORDER — PANTOPRAZOLE SODIUM 40 MG PO TBEC: 40.0000 mg | DELAYED_RELEASE_TABLET | Freq: Every day | ORAL | Status: DC

## 2015-04-29 NOTE — Assessment & Plan Note (Signed)
Epigastric pain and belching. Exam normal. Recent CMP normal. S/p cholecystectomy. Suspect gastritis or ulcer given she has stopped her Pantoprazole. Will restart pantoprazole. Stop Meloxicam, Naprosyn, and Metformin. Set up GI evaluation for upper endoscopy.

## 2015-04-29 NOTE — Progress Notes (Signed)
Pre visit review using our clinic review tool, if applicable. No additional management support is needed unless otherwise documented below in the visit note. 

## 2015-04-29 NOTE — Assessment & Plan Note (Signed)
Wt Readings from Last 3 Encounters:  04/29/15 210 lb 8 oz (95.482 kg)  04/21/15 209 lb (94.802 kg)  03/23/15 218 lb (98.884 kg)   Body mass index is 38.49 kg/(m^2). Congratulated pt on weight loss. Encouraged continued efforts at diet and exercise. Holding meformin for now, but will plan to resume after GI issues resolved.

## 2015-04-29 NOTE — Patient Instructions (Addendum)
Start back on Pantoprazole 35m daily.  Avoid use of Meloxicam and Naprosyn. Hold Metformin for now.  We will set up evaluation with GI.

## 2015-04-29 NOTE — Progress Notes (Signed)
Subjective:    Patient ID: Morgan Hurst, female    DOB: 1945/11/06, 70 y.o.   MRN: [id number]  HPI  70YO female presents for follow up.  Recently underwent cholecystectomy. Seen by Dr. Bary Castilla 4/4 and started on Reglan for belching.  Belching started Monday after her surgery. Notes increased epigastric pressure and pain. Reglan is helping some. No NV. No other abdominal pain. Not currently taking Pantoprazole. Previous testing for H. Pylori negative. Not taking Meloxicam or Naprosyn on a regular basis.  Started walking and has limited calorie intake. Has lost over 10lbs.   Wt Readings from Last 3 Encounters:  04/29/15 210 lb 8 oz (95.482 kg)  04/21/15 209 lb (94.802 kg)  03/23/15 218 lb (98.884 kg)   BP Readings from Last 3 Encounters:  04/29/15 132/82  04/21/15 138/82  04/10/15 123/62    Past Medical History  Diagnosis Date  . Arthritis   . Endometriosis   . Heart murmur   . Complication of anesthesia     FIGHTING WHEN WAKING UP FROM ANESTHESIA  . Borderline diabetes     PCP STARTED PT ON METFORMIN IN 2016 DUE TO ELEVATED GLUCOSE    Family History  Problem Relation Age of Onset  . Diabetes Mother   . Cancer Mother     colon and breast  . Cancer Father     Leukemia  . Cancer Brother     colon and lung  . Cholelithiasis Daughter   . Heart disease Paternal Grandfather   . Cancer Other 29    colon   Past Surgical History  Procedure Laterality Date  . Abdominal hysterectomy  age 71  . Ankle surgery    . Vaginal delivery  2  . Cholecystectomy N/A 04/10/2015    Procedure: LAPAROSCOPIC CHOLECYSTECTOMY WITH INTRAOPERATIVE CHOLANGIOGRAM;  Surgeon: Robert Bellow, MD;  Location: [facility] ORS;  Service: General;  Laterality: N/A;   Social History   Social History  . Marital Status: Widowed    Spouse Name: N/A  . Number of Children: N/A  . Years of Education: N/A   Social History Main Topics  . Smoking status: Never Smoker   . Smokeless tobacco: None  .  Alcohol Use: No  . Drug Use: No  . Sexual Activity: Not Asked   Other Topics Concern  . None   Social History Narrative   Lives with husband in [place], dog outside.      Work - retired from [employer] - regular   Exercise - walks occasionally, limited by fatigue    Review of Systems  Constitutional: Negative for fever, chills, appetite change, fatigue and unexpected weight change.  Eyes: Negative for visual disturbance.  Respiratory: Negative for shortness of breath.   Cardiovascular: Negative for chest pain and leg swelling.  Gastrointestinal: Positive for abdominal pain and abdominal distention. Negative for nausea, vomiting, diarrhea and constipation.  Musculoskeletal: Positive for myalgias and arthralgias.  Skin: Negative for color change and rash.  Hematological: Negative for adenopathy. Does not bruise/bleed easily.  Psychiatric/Behavioral: Negative for sleep disturbance and dysphoric mood. The patient is not nervous/anxious.        Objective:    BP 132/82 mmHg  Pulse 75  Temp(Src) 97.9 F (36.6 C) (Oral)  Ht 5' 2"  (1.575 m)  Wt 210 lb 8 oz (95.482 kg)  BMI 38.49 kg/m2  SpO2 97% Physical Exam  Constitutional: She is oriented to person, place, and time. She appears  well-developed and well-nourished. No distress.  HENT:  Head: Normocephalic and atraumatic.  Right Ear: External ear normal.  Left Ear: External ear normal.  Nose: Nose normal.  Mouth/Throat: Oropharynx is clear and moist. No oropharyngeal exudate.  Eyes: Conjunctivae are normal. Pupils are equal, round, and reactive to light. Right eye exhibits no discharge. Left eye exhibits no discharge. No scleral icterus.  Neck: Normal range of motion. Neck supple. No tracheal deviation present. No thyromegaly present.  Cardiovascular: Normal rate, regular rhythm, normal heart sounds and intact distal pulses.  Exam reveals no gallop and no friction rub.   No murmur heard. Pulmonary/Chest:  Effort normal and breath sounds normal. No respiratory distress. She has no wheezes. She has no rales. She exhibits no tenderness.  Abdominal: Soft. Bowel sounds are normal. She exhibits no distension and no mass. There is no tenderness. There is no rebound.  Musculoskeletal: Normal range of motion. She exhibits no edema or tenderness.  Lymphadenopathy:    She has no cervical adenopathy.  Neurological: She is alert and oriented to person, place, and time. No cranial nerve deficit. She exhibits normal muscle tone. Coordination normal.  Skin: Skin is warm and dry. No rash noted. She is not diaphoretic. No erythema. No pallor.  Psychiatric: She has a normal mood and affect. Her behavior is normal. Judgment and thought content normal.          Assessment & Plan:   Problem List Items Addressed This Visit      Unprioritized   Burping - Primary    Epigastric pain and belching. Exam normal. Recent CMP normal. S/p cholecystectomy. Suspect gastritis or ulcer given she has stopped her Pantoprazole. Will restart pantoprazole. Stop Meloxicam, Naprosyn, and Metformin. Set up GI evaluation for upper endoscopy.      Relevant Medications   pantoprazole (PROTONIX) 40 MG tablet   Other Relevant Orders   Ambulatory referral to Gastroenterology   Severe obesity (BMI >= 40) (HCC)    Wt Readings from Last 3 Encounters:  04/29/15 210 lb 8 oz (95.482 kg)  04/21/15 209 lb (94.802 kg)  03/23/15 218 lb (98.884 kg)   Body mass index is 38.49 kg/(m^2). Congratulated pt on weight loss. Encouraged continued efforts at diet and exercise. Holding meformin for now, but will plan to resume after GI issues resolved.          Return in about 4 weeks (around 05/27/2015) for Recheck.  Ronette Deter, MD [facility]

## 2015-05-07 DIAGNOSIS — K30 Functional dyspepsia: Secondary | ICD-10-CM | POA: Diagnosis not present

## 2015-05-07 DIAGNOSIS — R0789 Other chest pain: Secondary | ICD-10-CM | POA: Diagnosis not present

## 2015-05-07 DIAGNOSIS — R142 Eructation: Secondary | ICD-10-CM | POA: Diagnosis not present

## 2015-05-08 ENCOUNTER — Encounter: Payer: Self-pay | Admitting: *Deleted

## 2015-05-11 NOTE — Discharge Instructions (Signed)

## 2015-05-12 ENCOUNTER — Encounter: Payer: Self-pay | Admitting: Anesthesiology

## 2015-05-12 ENCOUNTER — Ambulatory Visit: Payer: Medicare Other | Admitting: Anesthesiology

## 2015-05-12 ENCOUNTER — Ambulatory Visit
Admission: RE | Admit: 2015-05-12 | Discharge: 2015-05-12 | Disposition: A | Discharge status: Home / Self Care | Payer: Medicare Other | Source: Ambulatory Visit | Attending: Gastroenterology | Admitting: Gastroenterology

## 2015-05-12 ENCOUNTER — Encounter
Admission: RE | Disposition: A | Discharge status: Home / Self Care | Payer: Self-pay | Source: Ambulatory Visit | Attending: Gastroenterology

## 2015-05-12 DIAGNOSIS — R7303 Prediabetes: Secondary | ICD-10-CM | POA: Diagnosis not present

## 2015-05-12 DIAGNOSIS — R143 Flatulence: Secondary | ICD-10-CM | POA: Diagnosis not present

## 2015-05-12 DIAGNOSIS — K219 Gastro-esophageal reflux disease without esophagitis: Secondary | ICD-10-CM | POA: Insufficient documentation

## 2015-05-12 DIAGNOSIS — Z7982 Long term (current) use of aspirin: Secondary | ICD-10-CM | POA: Insufficient documentation

## 2015-05-12 DIAGNOSIS — R110 Nausea: Secondary | ICD-10-CM | POA: Insufficient documentation

## 2015-05-12 DIAGNOSIS — R112 Nausea with vomiting, unspecified: Secondary | ICD-10-CM | POA: Diagnosis not present

## 2015-05-12 DIAGNOSIS — R142 Eructation: Secondary | ICD-10-CM | POA: Diagnosis not present

## 2015-05-12 DIAGNOSIS — Z7984 Long term (current) use of oral hypoglycemic drugs: Secondary | ICD-10-CM | POA: Insufficient documentation

## 2015-05-12 DIAGNOSIS — Z79899 Other long term (current) drug therapy: Secondary | ICD-10-CM | POA: Insufficient documentation

## 2015-05-12 DIAGNOSIS — R0789 Other chest pain: Secondary | ICD-10-CM | POA: Insufficient documentation

## 2015-05-12 DIAGNOSIS — Z6838 Body mass index (BMI) 38.0-38.9, adult: Secondary | ICD-10-CM | POA: Diagnosis not present

## 2015-05-12 DIAGNOSIS — K210 Gastro-esophageal reflux disease with esophagitis: Secondary | ICD-10-CM | POA: Diagnosis not present

## 2015-05-12 HISTORY — DX: Reserved for inherently not codable concepts without codable children: IMO0001

## 2015-05-12 HISTORY — PX: ESOPHAGOGASTRODUODENOSCOPY: SHX5428

## 2015-05-12 HISTORY — DX: Encounter for fitting and adjustment of orthodontic device: Z46.4

## 2015-05-12 LAB — GLUCOSE, CAPILLARY: Glucose-Capillary: 86 mg/dL (ref 65–99)

## 2015-05-12 SURGERY — EGD (ESOPHAGOGASTRODUODENOSCOPY)
Anesthesia: Monitor Anesthesia Care | Wound class: Clean Contaminated

## 2015-05-12 MED ORDER — GLYCOPYRROLATE 0.2 MG/ML IJ SOLN
INTRAMUSCULAR | Status: DC | PRN
  Administered 2015-05-12: .2 mg via INTRAVENOUS

## 2015-05-12 MED ORDER — LACTATED RINGERS IV SOLN
INTRAVENOUS | Status: DC
  Administered 2015-05-12: 11:00:00 via INTRAVENOUS

## 2015-05-12 MED ORDER — OXYCODONE HCL 5 MG/5ML PO SOLN: 5.0000 mg | Freq: Once | ORAL | Status: DC | PRN

## 2015-05-12 MED ORDER — OXYCODONE HCL 5 MG PO TABS: 5.0000 mg | ORAL_TABLET | Freq: Once | ORAL | Status: DC | PRN

## 2015-05-12 MED ORDER — PROPOFOL 10 MG/ML IV BOLUS
INTRAVENOUS | Status: DC | PRN
  Administered 2015-05-12: 30 mg via INTRAVENOUS
  Administered 2015-05-12: 60 mg via INTRAVENOUS
  Administered 2015-05-12 (×2): 30 mg via INTRAVENOUS

## 2015-05-12 MED ORDER — LIDOCAINE HCL (CARDIAC) 20 MG/ML IV SOLN
INTRAVENOUS | Status: DC | PRN
  Administered 2015-05-12: 40 mg via INTRAVENOUS

## 2015-05-12 SURGICAL SUPPLY — 41 items
BALLN DILATOR 10-12 8 (BALLOONS)
BALLN DILATOR 12-15 8 (BALLOONS)
BALLN DILATOR 15-18 8 (BALLOONS)
BALLN DILATOR CRE 0-12 8 (BALLOONS)
BALLN DILATOR ESOPH 8 10 CRE (MISCELLANEOUS) IMPLANT
BALLOON DILATOR 12-15 8 (BALLOONS) IMPLANT
BALLOON DILATOR 15-18 8 (BALLOONS) IMPLANT
BALLOON DILATOR CRE 0-12 8 (BALLOONS) IMPLANT
BLOCK BITE 60FR ADLT L/F GRN (MISCELLANEOUS) ×2 IMPLANT
CANISTER SUCT 1200ML W/VALVE (MISCELLANEOUS) ×2 IMPLANT
FCP ESCP3.2XJMB 240X2.8X (MISCELLANEOUS)
FORCEPS BIOP RAD 4 LRG CAP 4 (CUTTING FORCEPS) ×2 IMPLANT
FORCEPS BIOP RJ4 240 W/NDL (MISCELLANEOUS)
FORCEPS ESCP3.2XJMB 240X2.8X (MISCELLANEOUS) IMPLANT
GOWN CVR UNV OPN BCK APRN NK (MISCELLANEOUS) ×1 IMPLANT
GOWN ISOL THUMB LOOP REG UNIV (MISCELLANEOUS) ×1
GOWN STRL REUS W/ TWL LRG LVL3 (GOWN DISPOSABLE) ×1 IMPLANT
GOWN STRL REUS W/TWL LRG LVL3 (GOWN DISPOSABLE) ×1
HEMOCLIP INSTINCT (CLIP) IMPLANT
INJECTOR VARIJECT VIN23 (MISCELLANEOUS) IMPLANT
KIT CO2 TUBING (TUBING) IMPLANT
KIT DEFENDO VALVE AND CONN (KITS) IMPLANT
KIT ENDO PROCEDURE OLY (KITS) ×2 IMPLANT
LIGATOR MULTIBAND 6SHOOTER MBL (MISCELLANEOUS) IMPLANT
MARKER SPOT ENDO TATTOO 5ML (MISCELLANEOUS) IMPLANT
PAD GROUND ADULT SPLIT (MISCELLANEOUS) IMPLANT
SNARE SHORT THROW 13M SML OVAL (MISCELLANEOUS) IMPLANT
SNARE SHORT THROW 30M LRG OVAL (MISCELLANEOUS) IMPLANT
SPOT EX ENDOSCOPIC TATTOO (MISCELLANEOUS)
SUCTION POLY TRAP 4CHAMBER (MISCELLANEOUS) IMPLANT
SYR INFLATION 60ML (SYRINGE) IMPLANT
TRAP SUCTION POLY (MISCELLANEOUS) IMPLANT
TUBING CONN 6MMX3.1M (TUBING)
TUBING SUCTION CONN 0.25 STRL (TUBING) IMPLANT
UNDERPAD 30X60 958B10 (PK) (MISCELLANEOUS) IMPLANT
VALVE BIOPSY ENDO (VALVE) IMPLANT
VARIJECT INJECTOR VIN23 (MISCELLANEOUS)
WATER AUXILLARY (MISCELLANEOUS) IMPLANT
WATER STERILE IRR 250ML POUR (IV SOLUTION) ×2 IMPLANT
WATER STERILE IRR 500ML POUR (IV SOLUTION) IMPLANT
WIRE CRE 18-20MM 8CM F G (MISCELLANEOUS) IMPLANT

## 2015-05-12 NOTE — Anesthesia Preprocedure Evaluation (Addendum)
Anesthesia Evaluation  Patient identified by MRN, date of birth, ID band Patient awake    Reviewed: Allergy & Precautions, NPO status , Patient's Chart, lab work & pertinent test results  History of Anesthesia Complications (+) history of anesthetic complications (wake up fighting from anesthesia at age 70)  Airway Mallampati: II  TM Distance: <3 FB Neck ROM: full    Dental no notable dental hx. (+) Teeth Intact, Caps   Pulmonary neg pulmonary ROS,    Pulmonary exam normal        Cardiovascular Exercise Tolerance: Good (-) anginanegative cardio ROS Normal cardiovascular exam Rhythm:Regular Rate:Normal     Neuro/Psych negative neurological ROS  negative psych ROS   GI/Hepatic Neg liver ROS, GERD  Medicated,  Endo/Other  diabetes (borderline)Morbid obesity  Renal/GU negative Renal ROS  negative genitourinary   Musculoskeletal  (+) Arthritis ,   Abdominal (+) + obese,   Peds  Hematology negative hematology ROS (+)   Anesthesia Other Findings stress echo: 03/2015:ef=55%; Normal Stress Echocardiogram; NORMAL RIGHT VENTRICULAR SYSTOLIC FUNCTION; MILD VALVULAR REGURGITATION.  cards cleared: 03/2015: dr. Saralyn Pilar; had GETA for cholecystectomy on 3/24.  Ekg: nsr;   Reproductive/Obstetrics                           Anesthesia Physical Anesthesia Plan  ASA: II  Anesthesia Plan: MAC   Post-op Pain Management:    Induction:   Airway Management Planned:   Additional Equipment:   Intra-op Plan:   Post-operative Plan:   Informed Consent: I have reviewed the patients History and Physical, chart, labs and discussed the procedure including the risks, benefits and alternatives for the proposed anesthesia with the patient or authorized representative who has indicated his/her understanding and acceptance.     Plan Discussed with: CRNA  Anesthesia Plan Comments:         Anesthesia  Quick Evaluation

## 2015-05-12 NOTE — Anesthesia Procedure Notes (Signed)
Procedure Name: MAC Performed by: Livianna Petraglia Pre-anesthesia Checklist: Patient identified, Emergency Drugs available, Suction available, Timeout performed and Patient being monitored Patient Re-evaluated:Patient Re-evaluated prior to inductionOxygen Delivery Method: Nasal cannula Placement Confirmation: positive ETCO2       

## 2015-05-12 NOTE — H&P (Signed)
  Date of Initial H&P: 05/07/2015  History reviewed, patient examined, no change in status, stable for surgery.

## 2015-05-12 NOTE — Anesthesia Postprocedure Evaluation (Signed)
Anesthesia Post Note  Patient: Morgan Hurst  Procedure(s) Performed: Procedure(s) (LRB): ESOPHAGOGASTRODUODENOSCOPY (EGD) (N/A)  Patient location during evaluation: PACU Anesthesia Type: MAC Level of consciousness: awake and alert Pain management: pain level controlled Vital Signs Assessment: post-procedure vital signs reviewed and stable Respiratory status: spontaneous breathing, nonlabored ventilation, respiratory function stable and patient connected to nasal cannula oxygen Cardiovascular status: stable and blood pressure returned to baseline Anesthetic complications: no    Emojean Gertz

## 2015-05-12 NOTE — Transfer of Care (Signed)
Immediate Anesthesia Transfer of Care Note  Patient: Morgan Hurst  Procedure(s) Performed: Procedure(s): ESOPHAGOGASTRODUODENOSCOPY (EGD) (N/A)  Patient Location: PACU  Anesthesia Type: MAC  Level of Consciousness: awake, alert  and patient cooperative  Airway and Oxygen Therapy: Patient Spontanous Breathing and Patient connected to supplemental oxygen  Post-op Assessment: Post-op Vital signs reviewed, Patient's Cardiovascular Status Stable, Respiratory Function Stable, Patent Airway and No signs of Nausea or vomiting  Post-op Vital Signs: Reviewed and stable  Complications: No apparent anesthesia complications

## 2015-05-12 NOTE — Op Note (Signed)
[facility] Gastroenterology Patient Name: Morgan Hurst Procedure Date: 05/12/2015 12:29 PM MRN: [id number] Account #: [id number] Date of Birth: 11-21-1945 Admit Type: Outpatient Age: 70 Room: [facility] OR ROOM 01 Gender: Female Note Status: Finalized Procedure:            Upper GI endoscopy Indications:          Chest pain (non cardiac), Nausea, belching Providers:            Lupita Dawn. Candace Cruise, MD Referring MD:         Eduard Clos. Gilford Rile, MD (Referring MD) Medicines:            Monitored Anesthesia Care Complications:        No immediate complications. Procedure:            Pre-Anesthesia Assessment:                       - Prior to the procedure, a History and Physical was                        performed, and patient medications, allergies and                        sensitivities were reviewed. The patient's tolerance of                        previous anesthesia was reviewed.                       - The risks and benefits of the procedure and the                        sedation options and risks were discussed with the                        patient. All questions were answered and informed                        consent was obtained.                       - After reviewing the risks and benefits, the patient                        was deemed in satisfactory condition to undergo the                        procedure.                       After obtaining informed consent, the endoscope was                        passed under direct vision. Throughout the procedure,                        the patient's blood pressure, pulse, and oxygen                        saturations were monitored continuously. The Olympus  [id number]-[id number] Endoscope (S#. [id number]) was introduced                        through the mouth, and advanced to the second part of                        duodenum. The upper GI endoscopy was accomplished                        without difficulty.  The patient tolerated the procedure                        well. Findings:      The examined esophagus was normal. Biopsies were taken with a cold       forceps for histology.      The entire examined stomach was normal.      The examined duodenum was normal.      The exam was otherwise without abnormality. Impression:           - Normal esophagus. Biopsied.                       - Normal stomach.                       - Normal examined duodenum.                       - The examination was otherwise normal. Recommendation:       - Discharge patient to home.                       - Observe patient's clinical course.                       - Continue present medications.                       - Await pathology results.                       - The findings and recommendations were discussed with                        the patient. Procedure Code(s):    --- Professional ---                       [phone], Esophagogastroduodenoscopy, flexible, transoral;                        with biopsy, single or multiple Diagnosis Code(s):    --- Professional ---                       R07.89, Other chest pain                       R11.0, Nausea CPT copyright 2016 American Medical Association. All rights reserved. The codes documented in this report are preliminary and upon coder review may  be revised to meet current compliance requirements. Hulen Luster, MD 05/12/2015 12:49:40 PM This report has been signed electronically. Number of Addenda: 0 Note Initiated On: 05/12/2015 12:29 PM Total Procedure Duration: 0 hours  3 minutes 27 seconds       [facility]

## 2015-05-13 ENCOUNTER — Encounter: Payer: Self-pay | Admitting: Gastroenterology

## 2015-05-14 LAB — SURGICAL PATHOLOGY

## 2015-05-21 ENCOUNTER — Ambulatory Visit: Payer: Medicare Other

## 2015-05-21 DIAGNOSIS — R7303 Prediabetes: Secondary | ICD-10-CM | POA: Diagnosis not present

## 2015-05-21 DIAGNOSIS — K219 Gastro-esophageal reflux disease without esophagitis: Secondary | ICD-10-CM | POA: Diagnosis not present

## 2015-05-25 ENCOUNTER — Ambulatory Visit (INDEPENDENT_AMBULATORY_CARE_PROVIDER_SITE_OTHER): Payer: Medicare Other | Admitting: Internal Medicine

## 2015-05-25 VITALS — BP 129/75 | HR 74 | Temp 98.3°F | Resp 14 | Ht 62.0 in | Wt 211.6 lb

## 2015-05-25 DIAGNOSIS — E538 Deficiency of other specified B group vitamins: Secondary | ICD-10-CM

## 2015-05-25 DIAGNOSIS — R739 Hyperglycemia, unspecified: Secondary | ICD-10-CM

## 2015-05-25 DIAGNOSIS — R142 Eructation: Secondary | ICD-10-CM | POA: Diagnosis not present

## 2015-05-25 DIAGNOSIS — R7309 Other abnormal glucose: Secondary | ICD-10-CM | POA: Diagnosis not present

## 2015-05-25 MED ORDER — CYANOCOBALAMIN 1000 MCG/ML IJ SOLN
1000.0000 ug | Freq: Once | INTRAMUSCULAR | Status: AC
  Administered 2015-05-25: 1000 ug via INTRAMUSCULAR

## 2015-05-25 NOTE — Patient Instructions (Signed)
Continue current medication.  Follow up 3 months.

## 2015-05-25 NOTE — Assessment & Plan Note (Signed)
Wt Readings from Last 3 Encounters:  05/25/15 211 lb 9.6 oz (95.981 kg)  05/12/15 208 lb (94.348 kg)  04/29/15 210 lb 8 oz (95.482 kg)   Encouraged effort at healthy diet and exercise.

## 2015-05-25 NOTE — Progress Notes (Signed)
Subjective:    Patient ID: Morgan Hurst, female    DOB: December 30, 1945, 70 y.o.   MRN: [id number]  HPI  70YO female presents for follow up.  Recently having issues with dyspepsia and reflux symptoms. EGD performed by Dr. Candace Cruise on 4/25 was normal. However biopsy showed chronic inflammation. She was seen in follow up and started on Nexium. Plans are for esophageal manometry if symptoms are persistent over the next few months. Started Nexium on 5/5. Notes some improvement with this with less belching and heartburn.  Having some worsening arthritis pain. Started back on Meloxicam.  Stopped taking metformin, but would like to restart.  Wt Readings from Last 3 Encounters:  05/25/15 211 lb 9.6 oz (95.981 kg)  05/12/15 208 lb (94.348 kg)  04/29/15 210 lb 8 oz (95.482 kg)   BP Readings from Last 3 Encounters:  05/25/15 129/75  05/12/15 122/73  04/29/15 132/82    Past Medical History  Diagnosis Date  . Endometriosis   . Heart murmur   . Complication of anesthesia     FIGHTING WHEN WAKING UP FROM ANESTHESIA  . Borderline diabetes     PCP STARTED PT ON METFORMIN IN 2016 DUE TO ELEVATED GLUCOSE   . Arthritis     lower back, right hip  . Orthodontics     top front 3 teeth caps and bridge   Family History  Problem Relation Age of Onset  . Diabetes Mother   . Cancer Mother     colon and breast  . Cancer Father     Leukemia  . Cancer Brother     colon and lung  . Cholelithiasis Daughter   . Heart disease Paternal Grandfather   . Cancer Other 29    colon   Past Surgical History  Procedure Laterality Date  . Abdominal hysterectomy  age 81  . Ankle surgery    . Vaginal delivery  2  . Cholecystectomy N/A 04/10/2015    Procedure: LAPAROSCOPIC CHOLECYSTECTOMY WITH INTRAOPERATIVE CHOLANGIOGRAM;  Surgeon: Robert Bellow, MD;  Location: [facility] ORS;  Service: General;  Laterality: N/A;  . Esophagogastroduodenoscopy N/A 05/12/2015    Procedure: ESOPHAGOGASTRODUODENOSCOPY (EGD);   Surgeon: Hulen Luster, MD;  Location: [place];  Service: Gastroenterology;  Laterality: N/A;   Social History   Social History  . Marital Status: Widowed    Spouse Name: N/A  . Number of Children: N/A  . Years of Education: N/A   Social History Main Topics  . Smoking status: Never Smoker   . Smokeless tobacco: Not on file  . Alcohol Use: No  . Drug Use: No  . Sexual Activity: Not on file   Other Topics Concern  . Not on file   Social History Narrative   Lives with husband in [place], dog outside.      Work - retired from [employer] - regular   Exercise - walks occasionally, limited by fatigue    Review of Systems  Constitutional: Negative for fever, chills, appetite change, fatigue and unexpected weight change.  Eyes: Negative for visual disturbance.  Respiratory: Negative for shortness of breath.   Cardiovascular: Negative for chest pain and leg swelling.  Gastrointestinal: Negative for nausea, vomiting, abdominal pain, diarrhea, constipation and blood in stool.  Musculoskeletal: Negative for myalgias and arthralgias.  Skin: Negative for color change and rash.  Hematological: Negative for adenopathy. Does not bruise/bleed easily.  Psychiatric/Behavioral: Negative for sleep disturbance and dysphoric mood.  The patient is not nervous/anxious.        Objective:    BP 129/75 mmHg  Pulse 74  Temp(Src) 98.3 F (36.8 C) (Oral)  Resp 14  Ht 5' 2"  (1.575 m)  Wt 211 lb 9.6 oz (95.981 kg)  BMI 38.69 kg/m2  SpO2 97% Physical Exam  Constitutional: She is oriented to person, place, and time. She appears well-developed and well-nourished. No distress.  HENT:  Head: Normocephalic and atraumatic.  Right Ear: External ear normal.  Left Ear: External ear normal.  Nose: Nose normal.  Mouth/Throat: Oropharynx is clear and moist. No oropharyngeal exudate.  Eyes: Conjunctivae are normal. Pupils are equal, round, and reactive to light. Right eye  exhibits no discharge. Left eye exhibits no discharge. No scleral icterus.  Neck: Normal range of motion. Neck supple. No tracheal deviation present. No thyromegaly present.  Cardiovascular: Normal rate, regular rhythm, normal heart sounds and intact distal pulses.  Exam reveals no gallop and no friction rub.   No murmur heard. Pulmonary/Chest: Effort normal and breath sounds normal. No respiratory distress. She has no wheezes. She has no rales. She exhibits no tenderness.  Musculoskeletal: Normal range of motion. She exhibits no edema or tenderness.  Lymphadenopathy:    She has no cervical adenopathy.  Neurological: She is alert and oriented to person, place, and time. No cranial nerve deficit. She exhibits normal muscle tone. Coordination normal.  Skin: Skin is warm and dry. No rash noted. She is not diaphoretic. No erythema. No pallor.  Psychiatric: She has a normal mood and affect. Her behavior is normal. Judgment and thought content normal.          Assessment & Plan:   Problem List Items Addressed This Visit      Unprioritized   B12 deficiency   Relevant Medications   cyanocobalamin ((VITAMIN B-12)) injection 1,000 mcg (Completed)   Burping - Primary    Reviewed recent GI notes and endoscopy. Path showed chronic gastric inflammation. Started on Nexium with some improvement. Will continue to follow. Encouraged her to hold Metformin.      Elevated blood sugar    Recent BG well controlled. A1c 6%. Continue Metformin.      Severe obesity (BMI >= 40) (HCC)    Wt Readings from Last 3 Encounters:  05/25/15 211 lb 9.6 oz (95.981 kg)  05/12/15 208 lb (94.348 kg)  04/29/15 210 lb 8 oz (95.482 kg)   Encouraged effort at healthy diet and exercise.          Return in about 3 months (around 08/25/2015) for Recheck.  Ronette Deter, MD Internal Medicine [facility]

## 2015-05-25 NOTE — Assessment & Plan Note (Signed)
Reviewed recent GI notes and endoscopy. Path showed chronic gastric inflammation. Started on Nexium with some improvement. Will continue to follow. Encouraged her to hold Metformin.

## 2015-05-25 NOTE — Assessment & Plan Note (Signed)
Recent BG well controlled. A1c 6%. Continue Metformin.

## 2015-06-25 ENCOUNTER — Ambulatory Visit: Payer: Medicare Other

## 2015-07-02 ENCOUNTER — Ambulatory Visit: Payer: Medicare Other

## 2015-07-07 ENCOUNTER — Ambulatory Visit (INDEPENDENT_AMBULATORY_CARE_PROVIDER_SITE_OTHER): Payer: Medicare Other | Admitting: *Deleted

## 2015-07-07 DIAGNOSIS — E538 Deficiency of other specified B group vitamins: Secondary | ICD-10-CM

## 2015-07-07 MED ORDER — CYANOCOBALAMIN 1000 MCG/ML IJ SOLN
1000.0000 ug | Freq: Once | INTRAMUSCULAR | Status: AC
  Administered 2015-07-07: 1000 ug via INTRAMUSCULAR

## 2015-08-11 ENCOUNTER — Ambulatory Visit: Payer: Medicare Other

## 2015-08-12 ENCOUNTER — Ambulatory Visit (INDEPENDENT_AMBULATORY_CARE_PROVIDER_SITE_OTHER): Payer: Medicare Other

## 2015-08-12 VITALS — BP 128/82 | HR 76 | Temp 97.8°F | Resp 14 | Ht 61.0 in | Wt 208.8 lb

## 2015-08-12 DIAGNOSIS — Z0000 Encounter for general adult medical examination without abnormal findings: Secondary | ICD-10-CM | POA: Diagnosis not present

## 2015-08-12 DIAGNOSIS — E538 Deficiency of other specified B group vitamins: Secondary | ICD-10-CM | POA: Diagnosis not present

## 2015-08-12 MED ORDER — CYANOCOBALAMIN 1000 MCG/ML IJ SOLN
1000.0000 ug | Freq: Once | INTRAMUSCULAR | Status: AC
  Administered 2015-08-12: 1000 ug via INTRAMUSCULAR

## 2015-08-12 NOTE — Progress Notes (Signed)
Annual Wellness Visit as completed by Health Coach was reviewed in full.

## 2015-08-12 NOTE — Patient Instructions (Addendum)
Morgan Hurst , Thank you for taking time to come for your Medicare Wellness Visit. I appreciate your ongoing commitment to your health goals. Please review the following plan we discussed and let me know if I can assist you in the future.   Transition care to Morgan Paris, NP  Return for 3 month recheck 08/24/15   This is a list of the screening recommended for you and due dates:  Health Maintenance  Topic Date Due  .  Hepatitis C: One time screening is recommended by Center for Disease Control  (CDC) for  adults born from [birth year] through 1964.   1945/08/12  . Complete foot exam   06/05/1956  . DEXA scan (bone density measurement)  06/06/2011  . Urine Protein Check  02/15/2015  . Mammogram  06/23/2015  . Flu Shot  08/18/2015  . Eye exam for diabetics  08/18/2015  . Hemoglobin A1C  10/21/2015  . Tetanus Vaccine  12/26/2019  . Colon Cancer Screening  05/03/2022  . Shingles Vaccine  Completed  . Pneumonia vaccines  Completed    Bone Densitometry Bone densitometry is an imaging test that uses a special X-ray to measure the amount of calcium and other minerals in your bones (bone density). This test is also known as a bone mineral density test or dual-energy X-ray absorptiometry (DXA). The test can measure bone density at your hip and your spine. It is similar to having a regular X-ray. You may have this test to:  Diagnose a condition that causes weak or thin bones (osteoporosis).  Predict your risk of a broken bone (fracture).  Determine how well osteoporosis treatment is working. LET [facility] CARE PROVIDER KNOW ABOUT:  Any allergies you have.  All medicines you are taking, including vitamins, herbs, eye drops, creams, and over-the-counter medicines.  Previous problems you or members of your family have had with the use of anesthetics.  Any blood disorders you have.  Previous surgeries you have had.  Medical conditions you have.  Possibility of pregnancy.  Any other  medical test you had within the previous 14 days that used contrast material. RISKS AND COMPLICATIONS Generally, this is a safe procedure. However, problems can occur and may include the following:  This test exposes you to a very small amount of radiation.  The risks of radiation exposure may be greater to unborn children. BEFORE THE PROCEDURE  Do not take any calcium supplements for 24 hours before having the test. You can otherwise eat and drink what you usually do.  Take off all metal jewelry, eyeglasses, dental appliances, and any other metal objects. PROCEDURE  You may lie on an exam table. There will be an X-ray generator below you and an imaging device above you.  Other devices, such as boxes or braces, may be used to position your body properly for the scan.  You will need to lie still while the machine slowly scans your body.  The images will show up on a computer monitor. AFTER THE PROCEDURE You may need more testing at a later time.   This information is not intended to replace advice given to you by your health care provider. Make sure you discuss any questions you have with your health care provider.   Document Released: 01/26/2004 Document Revised: 01/24/2014 Document Reviewed: 06/13/2013 Elsevier Interactive Patient Education 2016 [place] A mammogram is an X-ray of the breasts that is done to check for abnormal changes. This procedure can screen for and detect any changes  that may suggest breast cancer. A mammogram can also identify other changes and variations in the breast, such as:  Inflammation of the breast tissue (mastitis).  An infected area that contains a collection of pus (abscess).  A fluid-filled sac (cyst).  Fibrocystic changes. This is when breast tissue becomes denser, which can make the tissue feel rope-like or uneven under the skin.  Tumors that are not cancerous (benign). LET [facility] CARE PROVIDER KNOW ABOUT:  Any  allergies you have.  If you have breast implants.  If you have had previous breast disease, biopsy, or surgery.  If you are breastfeeding.  Any possibility that you could be pregnant, if this applies.  If you are younger than age 21.  If you have a family history of breast cancer. RISKS AND COMPLICATIONS Generally, this is a safe procedure. However, problems may occur, including:  Exposure to radiation. Radiation levels are very low with this test.  The results being misinterpreted.  The need for further tests.  The inability of the mammogram to detect certain cancers. BEFORE THE PROCEDURE  Schedule your test about 1-2 weeks after your menstrual period. This is usually when your breasts are the least tender.  If you have had a mammogram done at a different facility in the past, get the mammogram X-rays or have them sent to your current exam facility in order to compare them.  Wash your breasts and under your arms the day of the test.  Do not wear deodorants, perfumes, lotions, or powders anywhere on your body on the day of the test.  Remove any jewelry from your neck.  Wear clothes that you can change into and out of easily. PROCEDURE  You will undress from the waist up and put on a gown.  You will stand in front of the X-ray machine.  Each breast will be placed between two plastic or glass plates. The plates will compress your breast for a few seconds. Try to stay as relaxed as possible during the procedure. This does not cause any harm to your breasts and any discomfort you feel will be very brief.  X-rays will be taken from different angles of each breast. The procedure may vary among health care providers and hospitals. AFTER THE PROCEDURE  The mammogram will be examined by a specialist (radiologist).  You may need to repeat certain parts of the test, depending on the quality of the images. This is commonly done if the radiologist needs a better view of the  breast tissue.  Ask when your test results will be ready. Make sure you get your test results.  You may resume your normal activities.   This information is not intended to replace advice given to you by your health care provider. Make sure you discuss any questions you have with your health care provider.   Document Released: 01/01/2000 Document Revised: 09/24/2014 Document Reviewed: 03/14/2014 Elsevier Interactive Patient Education [employer].

## 2015-08-12 NOTE — Progress Notes (Signed)
Patient came in for b12 injection.  Received in Left deltoid.  Patient tolerated well.  Patient had annual wellness visit with Health Coach today, thanks

## 2015-08-12 NOTE — Progress Notes (Signed)
Subjective:   Morgan Hurst is a 70 y.o. female who presents for Medicare Annual (Subsequent) preventive examination.  Review of Systems:  No ROS.  Medicare Wellness Visit.  Cardiac Risk Factors include: advanced age (>28mn, >>82women);obesity (BMI >30kg/m2)     Objective:     Vitals: BP 128/82 (BP Location: Right Arm, Patient Position: Sitting, Cuff Size: Normal)   Pulse 76   Temp 97.8 F (36.6 C) (Oral)   Resp 14   Ht 5' 1"  (1.549 m)   Wt 208 lb 12.8 oz (94.7 kg)   SpO2 96%   BMI 39.45 kg/m   Body mass index is 39.45 kg/m.   Tobacco History  Smoking Status  . Never Smoker  Smokeless Tobacco  . Not on file     Counseling given: Not Answered   Past Medical History:  Diagnosis Date  . Arthritis    lower back, right hip  . Borderline diabetes    PCP STARTED PT ON METFORMIN IN 2016 DUE TO ELEVATED GLUCOSE   . Complication of anesthesia    FIGHTING WHEN WAKING UP FROM ANESTHESIA  . Endometriosis   . Heart murmur   . Orthodontics    top front 3 teeth caps and bridge   Past Surgical History:  Procedure Laterality Date  . ABDOMINAL HYSTERECTOMY  age 89 . ANKLE SURGERY    . CHOLECYSTECTOMY N/A 04/10/2015   Procedure: LAPAROSCOPIC CHOLECYSTECTOMY WITH INTRAOPERATIVE CHOLANGIOGRAM;  Surgeon: JRobert Bellow MD;  Location: [facility];  Service: General;  Laterality: N/A;  . ESOPHAGOGASTRODUODENOSCOPY N/A 05/12/2015   Procedure: ESOPHAGOGASTRODUODENOSCOPY (EGD);  Surgeon: PHulen Luster MD;  Location: [facility]  Service: Gastroenterology;  Laterality: N/A;  . VAGINAL DELIVERY  2   Family History  Problem Relation Age of Onset  . Diabetes Mother   . Cancer Mother     colon and breast  . Cancer Father     Leukemia  . Cancer Brother     colon and lung  . Cholelithiasis Daughter   . Heart disease Paternal Grandfather   . Cancer Other 29    colon   History  Sexual Activity  . Sexual activity: No    Outpatient Encounter Prescriptions as of  08/12/2015  Medication Sig  . acetaminophen (TYLENOL) 325 MG tablet Take by mouth.  .Marland Kitchenaspirin 81 MG tablet Take 81 mg by mouth daily.  .Marland KitchenCALCIUM PO Take by mouth.  . Cholecalciferol (VITAMIN D3) 2000 units capsule Take by mouth.  . cyanocobalamin (,VITAMIN B-12,) 1000 MCG/ML injection Inject 1,000 mcg into the muscle every 30 (thirty) days.  .Marland Kitchenesomeprazole (NEXIUM) 40 MG capsule Take by mouth.  . metFORMIN (GLUCOPHAGE) 500 MG tablet Take 500 mg by mouth daily.  . ondansetron (ZOFRAN ODT) 4 MG disintegrating tablet Take 1 tablet (4 mg total) by mouth every 8 (eight) hours as needed for nausea or vomiting.  .Marland KitchenOVER THE COUNTER MEDICATION Take 1 tablet by mouth as needed. TART CHERRY EXTRACT  . rOPINIRole (REQUIP) 0.25 MG tablet Take by mouth.  . traMADol (ULTRAM) 50 MG tablet Take 1 tablet (50 mg total) by mouth 4 (four) times daily.  . TURMERIC PO Take 1 tablet by mouth daily.   No facility-administered encounter medications on file as of 08/12/2015.     Activities of Daily Living In your present state of health, do you have any difficulty performing the following activities: 08/12/2015 05/12/2015  Hearing? N N  Vision? N -  Difficulty concentrating or  making decisions? Y -  Walking or climbing stairs? N -  Dressing or bathing? N -  Doing errands, shopping? N -  Preparing Food and eating ? N -  Using the Toilet? N -  In the past six months, have you accidently leaked urine? N -  Do you have problems with loss of bowel control? N -  Managing your Medications? N -  Managing your Finances? N -  Housekeeping or managing your Housekeeping? N -  Some recent data might be hidden    Patient Care Team: Jackolyn Confer, MD as PCP - General (Internal Medicine) Jackolyn Confer, MD (Internal Medicine) Robert Bellow, MD (General Surgery)    Assessment:    This is a routine wellness examination for Morgan Hurst. The goal of the wellness visit is to assist the patient how to close the gaps in  care and create a preventative care plan for the patient.   Taking calcium VIT D as appropriate/Osteoporosis risk reviewed.  DEXA Scan ordered.  Medications reviewed; taking without issues or barriers.  Safety issues reviewed; smoke detectors in the home. Firearms locked in a secure area in the home. Wears seatbelts when driving or riding with others. No violence in the home.  No identified risk were noted; The patient was oriented x 3; appropriate in dress and manner and no objective failures at ADL's or IADL's.   Body mass index; discussed the importance of a healthy diet, water intake and exercise. Educational material provided.  Mammogram ordered.  Hepatitis C screening deferred for follow up with PCP.  Educational material provided.  Patient Concerns: Burping has subsided with OTC Nexium.  Loose/watery BM every 20-30 minutes after each meal. Yellow in color. No pain. Nausea every once in awhile.  Request refill of Tramadol and Zofran.  Follow up with provider at upcoming visit.  Exercise Activities and Dietary recommendations Current Exercise Habits: Home exercise routine, Type of exercise: walking, Time (Minutes): 30, Frequency (Times/Week): 3, Weekly Exercise (Minutes/Week): 90, Intensity: Mild  Goals    . Healthy Lifestyle          Continue making healthy meal choices. Stay hydrated and drink plenty of water. Maintain exercise regiment of walking the treadmill 3 days weekly, for 30 minutes.      Fall Risk Fall Risk  08/12/2015 02/14/2014 12/25/2012 12/26/2011  Falls in the past year? No No No No   Depression Screen PHQ 2/9 Scores 08/12/2015 02/14/2014 12/25/2012 12/26/2011  PHQ - 2 Score 0 0 0 0     Cognitive Testing MMSE - Mini Mental State Exam 08/12/2015  Orientation to time 5  Orientation to Place 5  Registration 3  Attention/ Calculation 5  Recall 3  Language- name 2 objects 2  Language- repeat 1  Language- follow 3 step command 3  Language- read & follow  direction 1  Write a sentence 1  Copy design 1  Total score 30    Immunization History  Administered Date(s) Administered  . Influenza Split 12/26/2011  . Influenza,inj,Quad PF,36+ Mos 12/25/2012, 03/18/2014, 10/07/2014  . Pneumococcal Conjugate-13 03/18/2014  . Pneumococcal Polysaccharide-23 12/26/2011  . Tdap 12/25/2009  . Zoster 12/26/2010   Screening Tests Health Maintenance  Topic Date Due  . Hepatitis C Screening  1945-08-30  . FOOT EXAM  06/05/1956  . DEXA SCAN  06/06/2011  . URINE MICROALBUMIN  02/15/2015  . MAMMOGRAM  06/23/2015  . INFLUENZA VACCINE  08/18/2015  . OPHTHALMOLOGY EXAM  08/18/2015  . HEMOGLOBIN A1C  10/21/2015  .  TETANUS/TDAP  12/26/2019  . COLONOSCOPY  05/03/2022  . ZOSTAVAX  Completed  . PNA vac Low Risk Adult  Completed      Plan:   End of life planning; Advance aging; Advanced directives discussed. Copy of current HCPOA/Living Will requested.  During the course of the visit the patient was educated and counseled about the following appropriate screening and preventive services:   Vaccines to include Pneumoccal, Influenza, Hepatitis B, Td, Zostavax, HCV  Electrocardiogram  Cardiovascular Disease  Colorectal cancer screening  Bone density screening  Diabetes screening  Glaucoma screening  Mammography/PAP  Nutrition counseling   Patient Instructions (the written plan) was given to the patient.   Varney Biles, LPN  06/05/8020

## 2015-08-18 ENCOUNTER — Encounter: Payer: Self-pay | Admitting: Internal Medicine

## 2015-08-18 DIAGNOSIS — Z1231 Encounter for screening mammogram for malignant neoplasm of breast: Secondary | ICD-10-CM | POA: Diagnosis not present

## 2015-08-18 DIAGNOSIS — M85862 Other specified disorders of bone density and structure, left lower leg: Secondary | ICD-10-CM | POA: Diagnosis not present

## 2015-08-24 ENCOUNTER — Ambulatory Visit (INDEPENDENT_AMBULATORY_CARE_PROVIDER_SITE_OTHER): Payer: Medicare Other | Admitting: Family

## 2015-08-24 ENCOUNTER — Encounter: Payer: Self-pay | Admitting: Family

## 2015-08-24 DIAGNOSIS — R7303 Prediabetes: Secondary | ICD-10-CM | POA: Diagnosis not present

## 2015-08-24 DIAGNOSIS — M25551 Pain in right hip: Secondary | ICD-10-CM | POA: Diagnosis not present

## 2015-08-24 NOTE — Progress Notes (Signed)
Pre visit review using our clinic review tool, if applicable. No additional management support is needed unless otherwise documented below in the visit note. 

## 2015-08-24 NOTE — Patient Instructions (Addendum)
Pleasure meeting you.   Keep up the great work with weight loss.

## 2015-08-24 NOTE — Assessment & Plan Note (Signed)
Stable. Congratulated patient on weight loss. Last A1c 6%. we'll continue metformin and recheck A1C at f/u.

## 2015-08-24 NOTE — Progress Notes (Signed)
Subjective:    Patient ID: Morgan Hurst, female    DOB: January 22, 1945, 70 y.o.   MRN: [id number]  CC: Morgan Hurst is a 70 y.o. female who presents today for follow up.   HPI: Here for follow up. No complaints today.   Prediabetes: Has been working on weight and lost 30 pounds. Continues to take metformin. No GI compliants.   Arthritic pain: Right hip, knees, low back. Stable on tramadol. Follows with sports medicine. Only takes tramadol BID.   Had mammogram and DEXA last week. Broke ankle 30 years ago; no recent fractures.  Colonoscopy UTD. Up-to-date on immunizations, patient had pneumococcal vaccine at age 13 and Prevnar 3 years later. No respiratory disease to suggest a booster.    HISTORY:  Past Medical History:  Diagnosis Date  . Arthritis    lower back, right hip  . Borderline diabetes    PCP STARTED PT ON METFORMIN IN 2016 DUE TO ELEVATED GLUCOSE   . Complication of anesthesia    FIGHTING WHEN WAKING UP FROM ANESTHESIA  . Endometriosis   . Heart murmur   . Orthodontics    top front 3 teeth caps and bridge   Past Surgical History:  Procedure Laterality Date  . ABDOMINAL HYSTERECTOMY  age 20  . ANKLE SURGERY    . CHOLECYSTECTOMY N/A 04/10/2015   Procedure: LAPAROSCOPIC CHOLECYSTECTOMY WITH INTRAOPERATIVE CHOLANGIOGRAM;  Surgeon: Robert Bellow, MD;  Location: [facility];  Service: General;  Laterality: N/A;  . ESOPHAGOGASTRODUODENOSCOPY N/A 05/12/2015   Procedure: ESOPHAGOGASTRODUODENOSCOPY (EGD);  Surgeon: Hulen Luster, MD;  Location: [facility];  Service: Gastroenterology;  Laterality: N/A;  . VAGINAL DELIVERY  2   Family History  Problem Relation Age of Onset  . Diabetes Mother   . Cancer Mother     colon and breast  . Cancer Father     Leukemia  . Cancer Brother     colon and lung  . Cholelithiasis Daughter   . Heart disease Paternal Grandfather   . Cancer Other 29    colon    Allergies: Review of patient's allergies indicates no known  allergies. Current Outpatient Prescriptions on File Prior to Visit  Medication Sig Dispense Refill  . acetaminophen (TYLENOL) 325 MG tablet Take by mouth.    Marland Kitchen aspirin 81 MG tablet Take 81 mg by mouth daily.    Marland Kitchen CALCIUM PO Take by mouth.    . Cholecalciferol (VITAMIN D3) 2000 units capsule Take by mouth.    . cyanocobalamin (,VITAMIN B-12,) 1000 MCG/ML injection Inject 1,000 mcg into the muscle every 30 (thirty) days.    Marland Kitchen esomeprazole (NEXIUM) 40 MG capsule Take by mouth.    . metFORMIN (GLUCOPHAGE) 500 MG tablet Take 500 mg by mouth daily.    . ondansetron (ZOFRAN ODT) 4 MG disintegrating tablet Take 1 tablet (4 mg total) by mouth every 8 (eight) hours as needed for nausea or vomiting. 20 tablet 0  . OVER THE COUNTER MEDICATION Take 1 tablet by mouth as needed. TART CHERRY EXTRACT    . rOPINIRole (REQUIP) 0.25 MG tablet Take by mouth.    . traMADol (ULTRAM) 50 MG tablet Take 1 tablet (50 mg total) by mouth 4 (four) times daily. 30 tablet 0  . TURMERIC PO Take 1 tablet by mouth daily.     No current facility-administered medications on file prior to visit.     Social History  Substance Use Topics  . Smoking status: Never Smoker  . Smokeless  tobacco: Never Used  . Alcohol use No    Review of Systems  Constitutional: Negative for chills and fever.  Respiratory: Negative for cough.   Cardiovascular: Negative for chest pain and palpitations.  Gastrointestinal: Negative for nausea and vomiting.  Musculoskeletal: Positive for back pain. Negative for joint swelling.      Objective:    BP 130/78 (BP Location: Left Arm, Patient Position: Sitting, Cuff Size: Large)   Pulse 90   Temp 97.6 F (36.4 C) (Oral)   Ht 5' 1.5" (1.562 m)   Wt 208 lb 3.2 oz (94.4 kg)   SpO2 96%   BMI 38.70 kg/m  BP Readings from Last 3 Encounters:  08/24/15 130/78  08/12/15 128/82  05/25/15 129/75   Wt Readings from Last 3 Encounters:  08/24/15 208 lb 3.2 oz (94.4 kg)  08/12/15 208 lb 12.8 oz (94.7  kg)  05/25/15 211 lb 9.6 oz (96 kg)    Physical Exam  Constitutional: She appears well-developed and well-nourished.  Eyes: Conjunctivae are normal.  Cardiovascular: Normal rate, regular rhythm, normal heart sounds and normal pulses.   Pulmonary/Chest: Effort normal and breath sounds normal. She has no wheezes. She has no rhonchi. She has no rales.  Neurological: She is alert.  Skin: Skin is warm and dry.  Psychiatric: She has a normal mood and affect. Her speech is normal and behavior is normal. Thought content normal.  Vitals reviewed.      Assessment & Plan:   Problem List Items Addressed This Visit      Other   Right hip pain    Chronic, as baseline. Right hip, knees, low back arthritis. Stable on tramadol. Follows with sports medicine. Will fill tramadol when patient runs out.       Prediabetes    Stable. Congratulated patient on weight loss. Last A1c 6%. we'll continue metformin and recheck A1C at f/u.       Other Visit Diagnoses   None.      I am having Ms. Spacek maintain her aspirin, ondansetron, OVER THE COUNTER MEDICATION, TURMERIC PO, cyanocobalamin, traMADol, acetaminophen, Vitamin D3, rOPINIRole, CALCIUM PO, metFORMIN, and esomeprazole.   No orders of the defined types were placed in this encounter.   Return precautions given.   Risks, benefits, and alternatives of the medications and treatment plan prescribed today were discussed, and patient expressed understanding.   Education regarding symptom management and diagnosis given to patient on AVS.  Continue to follow with Morgan Paris, Morgan Hurst for routine health maintenance.   [name] and I agreed with plan.   Morgan Paris, Morgan Hurst

## 2015-08-24 NOTE — Assessment & Plan Note (Addendum)
Chronic, as baseline. Right hip, knees, low back arthritis. Stable on tramadol. Follows with sports medicine. Will fill tramadol when patient runs out.

## 2015-08-26 ENCOUNTER — Ambulatory Visit: Payer: Medicare Other | Admitting: Internal Medicine

## 2015-08-26 NOTE — Progress Notes (Signed)
[facility] [address] [place], [place] [address] Phone: [phone] Subjective:     CC: Neck pain follow-up, shoulder pain follow-up  [id number]  Morgan Hurst is a 70 y.o. female coming in with complaint of neck pain an right shoulder pain .   Patient and right shoulder pain previously. MRI in November 2016 showed tendinopathy with severe acromial clavicular arthritis. Patient did respond fairly well to an intra-articular injection. Patient states was picking up a child and had some pain. States over the course of time and seems to be getting better though. Patient once to focus more on her right hand.  Patient is complaining of more of a right hand pain. Has had [facility] arthritis previously. Patient though states that the thumb is a little different. Getting stuck in a flexed position. Has to straighten it with the other side. States that it is affecting daily activities. Seems to be increasing in frequency.  Past Medical History:  Diagnosis Date  . Arthritis    lower back, right hip  . Borderline diabetes    PCP STARTED PT ON METFORMIN IN 2016 DUE TO ELEVATED GLUCOSE   . Complication of anesthesia    FIGHTING WHEN WAKING UP FROM ANESTHESIA  . Endometriosis   . Heart murmur   . Orthodontics    top front 3 teeth caps and bridge   Past Surgical History:  Procedure Laterality Date  . ABDOMINAL HYSTERECTOMY  age 60  . ANKLE SURGERY    . CHOLECYSTECTOMY N/A 04/10/2015   Procedure: LAPAROSCOPIC CHOLECYSTECTOMY WITH INTRAOPERATIVE CHOLANGIOGRAM;  Surgeon: Robert Bellow, MD;  Location: [facility];  Service: General;  Laterality: N/A;  . ESOPHAGOGASTRODUODENOSCOPY N/A 05/12/2015   Procedure: ESOPHAGOGASTRODUODENOSCOPY (EGD);  Surgeon: Hulen Luster, MD;  Location: [facility];  Service: Gastroenterology;  Laterality: N/A;  . VAGINAL DELIVERY  2   Social History  Substance Use Topics  . Smoking status: Never Smoker  . Smokeless tobacco: Never  Used  . Alcohol use No   No Known Allergies Family History  Problem Relation Age of Onset  . Diabetes Mother   . Cancer Mother     colon and breast  . Cancer Father     Leukemia  . Cancer Brother     colon and lung  . Cholelithiasis Daughter   . Heart disease Paternal Grandfather   . Cancer Other 29    colon       Past medical history, social, surgical and family history all reviewed in electronic medical record.   Review of Systems: No headache, visual changes, nausea, vomiting, diarrhea, constipation, dizziness, abdominal pain, skin rash, fevers, chills, night sweats, weight loss, swollen lymph nodes, body aches, joint swelling, muscle aches, chest pain, shortness of breath, mood changes.   Objective  There were no vitals taken for this visit. There is no height or weight on file to calculate BMI.   General: No apparent distress alert and oriented x3 mood and affect normal, dressed appropriately. Obese HEENT: Pupils equal, extraocular movements intact  Respiratory: Patient's speak in full sentences and does not appear short of breath  Cardiovascular: No lower extremity edema, non tender, no erythema  Skin: Warm dry intact with no signs of infection or rash on extremities or on axial skeleton.  Abdomen: Soft nontender  Neuro: Cranial nerves II through XII are intact, neurovascularly intact in all extremities with 2+ DTRs and 2+ pulses.  Lymph: No lymphadenopathy of posterior or anterior cervical chain or  axillae bilaterally.  Gait mild antalgic gait  MSK:  Non tender with full range of motion and good stability and symmetric strength and tone of , elbows, wrist, knee and ankles bilaterally. Mild to moderate osteophytic changes of multiple joints .  Procedure: Real-time Ultrasound Guided Injection of right trigger thumb Device: GE Logiq E  Ultrasound guided injection is preferred based studies that show increased duration, increased effect, greater accuracy, decreased  procedural pain, increased response rate, and decreased cost with ultrasound guided versus blind injection.  Verbal informed consent obtained.  Time-out conducted.  Noted no overlying erythema, induration, or other signs of local infection.  Skin prepped in a sterile fashion.  Local anesthesia: Topical Ethyl chloride.  With sterile technique and under real time ultrasound guidance:  With a 25-gauge half-inch needle patient was injected with a total of 0.5 mL of 0.5% Marcaine and 0.5 mL of Kenalog 41 g/dL Completed without difficulty  Pain immediately resolved suggesting accurate placement of the medication.  Advised to call if fevers/chills, erythema, induration, drainage, or persistent bleeding.  Images permanently stored and available for review in the ultrasound unit.  Impression: Technically successful ultrasound guided injection.    Impression and Recommendations:     This case required medical decision making of moderate complexity.

## 2015-08-27 ENCOUNTER — Ambulatory Visit (INDEPENDENT_AMBULATORY_CARE_PROVIDER_SITE_OTHER): Payer: Medicare Other | Admitting: Family Medicine

## 2015-08-27 ENCOUNTER — Other Ambulatory Visit: Payer: Self-pay

## 2015-08-27 ENCOUNTER — Encounter: Payer: Self-pay | Admitting: Family Medicine

## 2015-08-27 VITALS — BP 126/80 | HR 72 | Wt 205.0 lb

## 2015-08-27 DIAGNOSIS — M65311 Trigger thumb, right thumb: Secondary | ICD-10-CM

## 2015-08-27 MED ORDER — GABAPENTIN 100 MG PO CAPS: 200.0000 mg | ORAL_CAPSULE | Freq: Every day | ORAL | 3 refills | Status: DC

## 2015-08-27 NOTE — Assessment & Plan Note (Signed)
Patient given injection and tolerated the procedure well. Has had one greater than a year ago. We discussed icing regimen. Discussed home exercises. Patient will come back and see me again in 4 weeks to make sure she is responding well. We'll make sure patient shoulders doing much better as well.

## 2015-08-27 NOTE — Patient Instructions (Signed)
Good to see you  Gabapentin 257mgat night and if groggy then 1075mat night Keep watching the shoulder for now.  Ice is your friend See me when you need me or if shoulder does not go away.

## 2015-08-31 ENCOUNTER — Encounter: Payer: Self-pay | Admitting: Family

## 2015-08-31 DIAGNOSIS — N200 Calculus of kidney: Secondary | ICD-10-CM | POA: Insufficient documentation

## 2015-09-15 ENCOUNTER — Ambulatory Visit (INDEPENDENT_AMBULATORY_CARE_PROVIDER_SITE_OTHER): Payer: Medicare Other

## 2015-09-15 DIAGNOSIS — E538 Deficiency of other specified B group vitamins: Secondary | ICD-10-CM | POA: Diagnosis not present

## 2015-09-15 MED ORDER — CYANOCOBALAMIN 1000 MCG/ML IJ SOLN
1000.0000 ug | Freq: Once | INTRAMUSCULAR | Status: AC
  Administered 2015-09-15: 1000 ug via INTRAMUSCULAR

## 2015-09-15 MED ORDER — CYANOCOBALAMIN 1000 MCG/ML IJ SOLN: 1000.0000 ug | Freq: Once | INTRAMUSCULAR | Status: DC

## 2015-09-15 NOTE — Progress Notes (Signed)
Pt was in today receiving a B12 injection in the right deltoid. Pt tolerated well.

## 2015-10-20 ENCOUNTER — Ambulatory Visit: Payer: Medicare Other

## 2015-10-27 ENCOUNTER — Ambulatory Visit (INDEPENDENT_AMBULATORY_CARE_PROVIDER_SITE_OTHER): Payer: Medicare Other

## 2015-10-27 DIAGNOSIS — E538 Deficiency of other specified B group vitamins: Secondary | ICD-10-CM

## 2015-10-27 MED ORDER — CYANOCOBALAMIN 1000 MCG/ML IJ SOLN
1000.0000 ug | Freq: Once | INTRAMUSCULAR | Status: AC
  Administered 2015-10-27: 1000 ug via INTRAMUSCULAR

## 2015-10-27 NOTE — Progress Notes (Signed)
Patient came in for b12 injection, received in left deltoid.  Patient tolerated well.

## 2015-11-25 ENCOUNTER — Encounter: Payer: Self-pay | Admitting: Family

## 2015-11-25 ENCOUNTER — Ambulatory Visit (INDEPENDENT_AMBULATORY_CARE_PROVIDER_SITE_OTHER): Payer: Medicare Other | Admitting: Family

## 2015-11-25 VITALS — BP 132/78 | HR 75 | Temp 98.0°F | Resp 18 | Wt 211.5 lb

## 2015-11-25 DIAGNOSIS — R911 Solitary pulmonary nodule: Secondary | ICD-10-CM

## 2015-11-25 DIAGNOSIS — M48062 Spinal stenosis, lumbar region with neurogenic claudication: Secondary | ICD-10-CM | POA: Diagnosis not present

## 2015-11-25 DIAGNOSIS — R252 Cramp and spasm: Secondary | ICD-10-CM

## 2015-11-25 DIAGNOSIS — Z23 Encounter for immunization: Secondary | ICD-10-CM

## 2015-11-25 DIAGNOSIS — E538 Deficiency of other specified B group vitamins: Secondary | ICD-10-CM | POA: Diagnosis not present

## 2015-11-25 DIAGNOSIS — K760 Fatty (change of) liver, not elsewhere classified: Secondary | ICD-10-CM | POA: Diagnosis not present

## 2015-11-25 MED ORDER — ROPINIROLE HCL 0.25 MG PO TABS: 0.2500 mg | ORAL_TABLET | Freq: Every evening | ORAL | 0 refills | Status: DC | PRN

## 2015-11-25 MED ORDER — CYANOCOBALAMIN 1000 MCG/ML IJ SOLN
1000.0000 ug | Freq: Once | INTRAMUSCULAR | Status: AC
  Administered 2015-11-25: 1000 ug via INTRAMUSCULAR

## 2015-11-25 MED ORDER — TRAMADOL HCL 50 MG PO TABS: 50.0000 mg | ORAL_TABLET | Freq: Every day | ORAL | 0 refills | Status: DC | PRN

## 2015-11-25 NOTE — Assessment & Plan Note (Signed)
Stable. Improved on requip prn. Will continue along with b12 injections.

## 2015-11-25 NOTE — Progress Notes (Signed)
Subjective:    Patient ID: Morgan Hurst, female    DOB: May 09, 1945, 70 y.o.   MRN: [id number]  CC: Morgan Hurst is a 70 y.o. female who presents today for follow up.   HPI: Here for follow up and refill of tramadol.   Left leg pain for past couple of weeks, no injury or fall. Unchanged. Pain worse at night. Doesn't bother her during the day. Sleeps on left side but now sleeping on right side due to pain in left leg and hip. No numbness and tingling in legs. Right hip pain has improved. Pain worse after walking, shopping.  No incontinence, or h/o cancer. Will take 1/2 tramadol and pain resolves.   Takes tramadol approx 3 x per week for low back and leg pain. Started medication years ago.    Hasn't tried PT in past. Started tumeric QD, tyleonol QD, and gabapentin PRN for take for arthritis.   LE muscle cramps for past year-- no restless legs per patient. Takes PRN as needed. Continues to do b12 injections as helps with 'everything' and also cramps. Increases energy. Magnesium and electrolytes normal in 2017.       HISTORY:  Past Medical History:  Diagnosis Date  . Arthritis    lower back, right hip  . Borderline diabetes    PCP STARTED PT ON METFORMIN IN 2016 DUE TO ELEVATED GLUCOSE   . Complication of anesthesia    FIGHTING WHEN WAKING UP FROM ANESTHESIA  . Endometriosis   . Heart murmur   . Orthodontics    top front 3 teeth caps and bridge   Past Surgical History:  Procedure Laterality Date  . ABDOMINAL HYSTERECTOMY  age 68  . ANKLE SURGERY    . CHOLECYSTECTOMY N/A 04/10/2015   Procedure: LAPAROSCOPIC CHOLECYSTECTOMY WITH INTRAOPERATIVE CHOLANGIOGRAM;  Surgeon: Robert Bellow, MD;  Location: [facility];  Service: General;  Laterality: N/A;  . ESOPHAGOGASTRODUODENOSCOPY N/A 05/12/2015   Procedure: ESOPHAGOGASTRODUODENOSCOPY (EGD);  Surgeon: Hulen Luster, MD;  Location: [place];  Service: Gastroenterology;  Laterality: N/A;  . VAGINAL DELIVERY  2   Family  History  Problem Relation Age of Onset  . Diabetes Mother   . Cancer Mother     colon and breast  . Cancer Father     Leukemia  . Cancer Brother     colon and lung  . Cholelithiasis Daughter   . Heart disease Paternal Grandfather   . Cancer Other 29    colon    Allergies: Patient has no known allergies. Current Outpatient Prescriptions on File Prior to Visit  Medication Sig Dispense Refill  . acetaminophen (TYLENOL) 325 MG tablet Take by mouth.    Marland Kitchen aspirin 81 MG tablet Take 81 mg by mouth daily.    Marland Kitchen CALCIUM PO Take by mouth.    . Cholecalciferol (VITAMIN D3) 2000 units capsule Take by mouth.    . cyanocobalamin (,VITAMIN B-12,) 1000 MCG/ML injection Inject 1,000 mcg into the muscle every 30 (thirty) days.    Marland Kitchen esomeprazole (NEXIUM) 40 MG capsule Take by mouth.    . gabapentin (NEURONTIN) 100 MG capsule Take 2 capsules (200 mg total) by mouth at bedtime. 60 capsule 3  . metFORMIN (GLUCOPHAGE) 500 MG tablet Take 500 mg by mouth daily.    . ondansetron (ZOFRAN ODT) 4 MG disintegrating tablet Take 1 tablet (4 mg total) by mouth every 8 (eight) hours as needed for nausea or vomiting. 20 tablet 0  . OVER THE COUNTER  MEDICATION Take 1 tablet by mouth as needed. TART CHERRY EXTRACT    . TURMERIC PO Take 1 tablet by mouth daily.     No current facility-administered medications on file prior to visit.     Social History  Substance Use Topics  . Smoking status: Never Smoker  . Smokeless tobacco: Never Used  . Alcohol use No    Review of Systems  Constitutional: Negative for chills and fever.  Respiratory: Negative for cough.   Cardiovascular: Negative for chest pain and palpitations.  Gastrointestinal: Negative for nausea and vomiting.  Musculoskeletal: Positive for back pain (chronic).  Neurological: Negative for dizziness and numbness.      Objective:    BP 132/78 (BP Location: Left Arm, Patient Position: Sitting, Cuff Size: Large)   Pulse 75   Temp 98 F (36.7 C)    Resp 18   Wt 211 lb 8 oz (95.9 kg)   SpO2 98%   BMI 39.32 kg/m  BP Readings from Last 3 Encounters:  11/25/15 132/78  08/27/15 126/80  08/24/15 130/78   Wt Readings from Last 3 Encounters:  11/25/15 211 lb 8 oz (95.9 kg)  08/27/15 205 lb (93 kg)  08/24/15 208 lb 3.2 oz (94.4 kg)    Physical Exam  Constitutional: She appears well-developed and well-nourished.  Eyes: Conjunctivae are normal.  Cardiovascular: Normal rate, regular rhythm, normal heart sounds and normal pulses.   Pulmonary/Chest: Effort normal and breath sounds normal. She has no wheezes. She has no rhonchi. She has no rales.  Musculoskeletal:       Lumbar back: She exhibits normal range of motion, no tenderness, no bony tenderness, no swelling, no edema, no pain and no spasm.  Full range of motion with flexion, tension, lateral side bends. No bony tenderness. No pain, numbness, tingling elicited with single leg raise bilaterally.   Neurological: She is alert. She has normal strength. No sensory deficit.  Reflex Scores:      Patellar reflexes are 2+ on the right side and 2+ on the left side. Sensation and strength intact bilateral lower extremities.  Skin: Skin is warm and dry.  Psychiatric: She has a normal mood and affect. Her speech is normal and behavior is normal. Thought content normal.  Vitals reviewed.      Assessment & Plan:   Problem List Items Addressed This Visit      Digestive   Fatty liver disease, nonalcoholic    Mild. Discussed with patient the importance of maintaining  a healthy weight and cholesterol. She will return for CPE and will draw fasting lipids and liver function tests. Will continue to follow.         Other   Spinal stenosis, lumbar region, with neurogenic claudication - Primary    Chronic. S/w consistent with DDD , suspect left side trochanteric bursitis may also be contributing.Pending PT eval. Pain well controlled on when necessary tramadol.Assessed [place] Controlled Substance  Reporting System and did not see suspicious activity at this time under patient's name and address in Epic.  Has CSC.       Relevant Medications   traMADol (ULTRAM) 50 MG tablet   Other Relevant Orders   Ambulatory referral to Physical Therapy   Muscle cramp    Stable. Improved on requip prn. Will continue along with b12 injections.       Relevant Medications   rOPINIRole (REQUIP) 0.25 MG tablet   Lung nodule    Pending CT chest to evaluate if persistent.  Relevant Orders   CT Chest Wo Contrast    Other Visit Diagnoses    Encounter for immunization       Relevant Orders   Flu vaccine HIGH DOSE PF (Completed)       I have changed Ms. Blum's traMADol and rOPINIRole. I am also having her maintain her aspirin, ondansetron, OVER THE COUNTER MEDICATION, TURMERIC PO, cyanocobalamin, acetaminophen, Vitamin D3, CALCIUM PO, metFORMIN, esomeprazole, and gabapentin.   Meds ordered this encounter  Medications  . traMADol (ULTRAM) 50 MG tablet    Sig: Take 1 tablet (50 mg total) by mouth daily as needed.    Dispense:  30 tablet    Refill:  0  . rOPINIRole (REQUIP) 0.25 MG tablet    Sig: Take 1 tablet (0.25 mg total) by mouth at bedtime as needed.    Dispense:  90 tablet    Refill:  0    Return precautions given.   Risks, benefits, and alternatives of the medications and treatment plan prescribed today were discussed, and patient expressed understanding.   Education regarding symptom management and diagnosis given to patient on AVS.  Continue to follow with Mable Paris, FNP for routine health maintenance.   [name] and I agreed with plan.   Mable Paris, FNP

## 2015-11-25 NOTE — Assessment & Plan Note (Addendum)
Chronic. S/w consistent with DDD , suspect left side trochanteric bursitis may also be contributing.Pending PT eval. Pain well controlled on when necessary tramadol.Assessed [address] Controlled Substance Reporting System and did not see suspicious activity at this time under patient's name and address in Epic.  New CSC.

## 2015-11-25 NOTE — Patient Instructions (Addendum)
F/u CPE  Let's treat conservatively as we discussed.   Over-the-counter medications you may try for arthritic pain include:   ThermaCare patches   Capsaicin cream* made from jalepeno peppers*    Icy hot  Physical therapy  If conservative treatment doesn't yield results, we will consider physical therapy, consult to Sports Medicine/Orthopedics for further evaluation, and imaging.   If there is no improvement in your symptoms, or if there is any worsening of symptoms, or if you have any additional concerns, please return for re-evaluation; or, if we are closed, consider going to the Emergency Room for evaluation if symptoms urgent.

## 2015-11-25 NOTE — Assessment & Plan Note (Signed)
Mild. Discussed with patient the importance of maintaining  a healthy weight and cholesterol. She will return for CPE and will draw fasting lipids and liver function tests. Will continue to follow.

## 2015-11-25 NOTE — Assessment & Plan Note (Signed)
Pending CT chest to evaluate if persistent.

## 2015-11-25 NOTE — Progress Notes (Signed)
Pre visit review using our clinic review tool, if applicable. No additional management support is needed unless otherwise documented below in the visit note. 

## 2015-12-03 ENCOUNTER — Telehealth: Payer: Self-pay | Admitting: Family

## 2015-12-03 ENCOUNTER — Ambulatory Visit
Admission: RE | Admit: 2015-12-03 | Discharge: 2015-12-03 | Disposition: A | Discharge status: Home / Self Care | Payer: Medicare Other | Source: Ambulatory Visit | Attending: Family | Admitting: Family

## 2015-12-03 ENCOUNTER — Encounter: Payer: Self-pay | Admitting: Family

## 2015-12-03 DIAGNOSIS — R911 Solitary pulmonary nodule: Secondary | ICD-10-CM | POA: Diagnosis present

## 2015-12-03 DIAGNOSIS — R918 Other nonspecific abnormal finding of lung field: Secondary | ICD-10-CM | POA: Diagnosis not present

## 2015-12-03 NOTE — Telephone Encounter (Signed)
close

## 2015-12-25 ENCOUNTER — Ambulatory Visit (INDEPENDENT_AMBULATORY_CARE_PROVIDER_SITE_OTHER): Payer: Medicare Other

## 2015-12-25 DIAGNOSIS — E538 Deficiency of other specified B group vitamins: Secondary | ICD-10-CM | POA: Diagnosis not present

## 2015-12-25 MED ORDER — CYANOCOBALAMIN 1000 MCG/ML IJ SOLN
1000.0000 ug | Freq: Once | INTRAMUSCULAR | Status: AC
  Administered 2015-12-25: 1000 ug via INTRAMUSCULAR

## 2015-12-25 NOTE — Progress Notes (Signed)
Patient comes in for B 12 injection.  Injected right deltoid.  Patient tolerated injection well.

## 2015-12-31 ENCOUNTER — Ambulatory Visit (INDEPENDENT_AMBULATORY_CARE_PROVIDER_SITE_OTHER): Payer: Medicare Other | Admitting: Family

## 2015-12-31 ENCOUNTER — Encounter: Payer: Self-pay | Admitting: Family

## 2015-12-31 VITALS — BP 146/78 | HR 91 | Temp 97.6°F | Ht 61.5 in | Wt 212.8 lb

## 2015-12-31 DIAGNOSIS — R6889 Other general symptoms and signs: Secondary | ICD-10-CM | POA: Diagnosis not present

## 2015-12-31 LAB — POCT INFLUENZA A/B
INFLUENZA A, POC: NEGATIVE
INFLUENZA B, POC: NEGATIVE

## 2015-12-31 MED ORDER — OSELTAMIVIR PHOSPHATE 75 MG PO CAPS: 75.0000 mg | ORAL_CAPSULE | Freq: Two times a day (BID) | ORAL | 0 refills | Status: AC

## 2015-12-31 NOTE — Patient Instructions (Signed)
Suspect flu  Let me know if not better   Influenza, Adult Influenza, more commonly known as "the flu," is a viral infection that primarily affects the respiratory tract. The respiratory tract includes organs that help you breathe, such as the lungs, nose, and throat. The flu causes many common cold symptoms, as well as a high fever and body aches. The flu spreads easily from person to person (is contagious). Getting a flu shot (influenza vaccination) every year is the best way to prevent influenza. What are the causes? Influenza is caused by a virus. You can catch the virus by:  Breathing in droplets from an infected person's cough or sneeze.  Touching something that was recently contaminated with the virus and then touching your mouth, nose, or eyes. What increases the risk? The following factors may make you more likely to get the flu:  Not cleaning your hands frequently with soap and water or alcohol-based hand sanitizer.  Having close contact with many people during cold and flu season.  Touching your mouth, eyes, or nose without washing or sanitizing your hands first.  Not drinking enough fluids or not eating a healthy diet.  Not getting enough sleep or exercise.  Being under a high amount of stress.  Not getting a yearly (annual) flu shot. You may be at a higher risk of complications from the flu, such as a severe lung infection (pneumonia), if you:  Are over the age of 16.  Are pregnant.  Have a weakened disease-fighting system (immune system). You may have a weakened immune system if you:  Have HIV or AIDS.  Are undergoing chemotherapy.  Aretaking medicines that reduce the activity of (suppress) the immune system.  Have a long-term (chronic) illness, such as heart disease, kidney disease, diabetes, or lung disease.  Have a liver disorder.  Are obese.  Have anemia. What are the signs or symptoms? Symptoms of this condition typically last 4-10 days and may  include:  Fever.  Chills.  Headache, body aches, or muscle aches.  Sore throat.  Cough.  Runny or congested nose.  Chest discomfort and cough.  Poor appetite.  Weakness or tiredness (fatigue).  Dizziness.  Nausea or vomiting. How is this diagnosed? This condition may be diagnosed based on your medical history and a physical exam. Your health care provider may do a nose or throat swab test to confirm the diagnosis. How is this treated? If influenza is detected early, you can be treated with antiviral medicine that can reduce the length of your illness and the severity of your symptoms. This medicine may be given by mouth (orally) or through an IV tube that is inserted in one of your veins. The goal of treatment is to relieve symptoms by taking care of yourself at home. This may include taking over-the-counter medicines, drinking plenty of fluids, and adding humidity to the air in your home. In some cases, influenza goes away on its own. Severe influenza or complications from influenza may be treated in a hospital. Follow these instructions at home:  Take over-the-counter and prescription medicines only as told by your health care provider.  Use a cool mist humidifier to add humidity to the air in your home. This can make breathing easier.  Rest as needed.  Drink enough fluid to keep your urine clear or pale yellow.  Cover your mouth and nose when you cough or sneeze.  Wash your hands with soap and water often, especially after you cough or sneeze. If soap and  water are not available, use hand sanitizer.  Stay home from work or school as told by your health care provider. Unless you are visiting your health care provider, try to avoid leaving home until your fever has been gone for 24 hours without the use of medicine.  Keep all follow-up visits as told by your health care provider. This is important. How is this prevented?  Getting an annual flu shot is the best way to  avoid getting the flu. You may get the flu shot in late summer, fall, or winter. Ask your health care provider when you should get your flu shot.  Wash your hands often or use hand sanitizer often.  Avoid contact with people who are sick during cold and flu season.  Eat a healthy diet, drink plenty of fluids, get enough sleep, and exercise regularly. Contact a health care provider if:  You develop new symptoms.  You have:  Chest pain.  Diarrhea.  A fever.  Your cough gets worse.  You produce more mucus.  You feel nauseous or you vomit. Get help right away if:  You develop shortness of breath or difficulty breathing.  Your skin or nails turn a bluish color.  You have severe pain or stiffness in your neck.  You develop a sudden headache or sudden pain in your face or ear.  You cannot stop vomiting. This information is not intended to replace advice given to you by your health care provider. Make sure you discuss any questions you have with your health care provider. Document Released: 01/01/2000 Document Revised: 06/11/2015 Document Reviewed: 10/28/2014 Elsevier Interactive Patient Education  2017 [employer].

## 2015-12-31 NOTE — Progress Notes (Signed)
Pre visit review using our clinic review tool, if applicable. No additional management support is needed unless otherwise documented below in the visit note. 

## 2015-12-31 NOTE — Progress Notes (Signed)
Subjective:    Patient ID: Morgan Hurst, female    DOB: 24-Aug-1945, 70 y.o.   MRN: [id number]  CC: Morgan Hurst is a 70 y.o. female who presents today for an acute visit.    HPI: CC; cough, headache started last night. 4 family members have flu. Endorses sore throat, some body aches, right ear pain. Overall 'feeling ruff.' No fever. HA improved with tyleonol and tramadol bh. No vision changes, chest pain, SOB.      HISTORY:  Past Medical History:  Diagnosis Date  . Arthritis    lower back, right hip  . Borderline diabetes    PCP STARTED PT ON METFORMIN IN 2016 DUE TO ELEVATED GLUCOSE   . Complication of anesthesia    FIGHTING WHEN WAKING UP FROM ANESTHESIA  . Endometriosis   . Heart murmur   . Orthodontics    top front 3 teeth caps and bridge   Past Surgical History:  Procedure Laterality Date  . ABDOMINAL HYSTERECTOMY  age 73  . ANKLE SURGERY    . CHOLECYSTECTOMY N/A 04/10/2015   Procedure: LAPAROSCOPIC CHOLECYSTECTOMY WITH INTRAOPERATIVE CHOLANGIOGRAM;  Surgeon: Morgan Bellow, MD;  Location: [facility];  Service: General;  Laterality: N/A;  . ESOPHAGOGASTRODUODENOSCOPY N/A 05/12/2015   Procedure: ESOPHAGOGASTRODUODENOSCOPY (EGD);  Surgeon: Morgan Luster, MD;  Location: [place];  Service: Gastroenterology;  Laterality: N/A;  . VAGINAL DELIVERY  2   Family History  Problem Relation Age of Onset  . Diabetes Mother   . Cancer Mother     colon and breast  . Cancer Father     Leukemia  . Cancer Brother     colon and lung  . Cholelithiasis Daughter   . Heart disease Paternal Grandfather   . Cancer Other 29    colon    Allergies: Patient has no known allergies. Current Outpatient Prescriptions on File Prior to Visit  Medication Sig Dispense Refill  . acetaminophen (TYLENOL) 325 MG tablet Take by mouth.    Marland Kitchen aspirin 81 MG tablet Take 81 mg by mouth daily.    Marland Kitchen CALCIUM PO Take by mouth.    . Cholecalciferol (VITAMIN D3) 2000 units capsule Take by  mouth.    . cyanocobalamin (,VITAMIN B-12,) 1000 MCG/ML injection Inject 1,000 mcg into the muscle every 30 (thirty) days.    Marland Kitchen esomeprazole (NEXIUM) 40 MG capsule Take by mouth.    . gabapentin (NEURONTIN) 100 MG capsule Take 2 capsules (200 mg total) by mouth at bedtime. 60 capsule 3  . metFORMIN (GLUCOPHAGE) 500 MG tablet Take 500 mg by mouth daily.    . ondansetron (ZOFRAN ODT) 4 MG disintegrating tablet Take 1 tablet (4 mg total) by mouth every 8 (eight) hours as needed for nausea or vomiting. 20 tablet 0  . OVER THE COUNTER MEDICATION Take 1 tablet by mouth as needed. TART CHERRY EXTRACT    . rOPINIRole (REQUIP) 0.25 MG tablet Take 1 tablet (0.25 mg total) by mouth at bedtime as needed. 90 tablet 0  . traMADol (ULTRAM) 50 MG tablet Take 1 tablet (50 mg total) by mouth daily as needed. 30 tablet 0  . TURMERIC PO Take 1 tablet by mouth daily.     No current facility-administered medications on file prior to visit.     Social History  Substance Use Topics  . Smoking status: Never Smoker  . Smokeless tobacco: Never Used  . Alcohol use No    Review of Systems  Constitutional: Positive for fever.  Negative for chills.  HENT: Positive for ear pain and sore throat. Negative for congestion.   Respiratory: Positive for cough. Negative for shortness of breath and wheezing.   Cardiovascular: Negative for chest pain and palpitations.  Gastrointestinal: Negative for nausea and vomiting.  Musculoskeletal: Positive for myalgias.      Objective:    BP (!) 146/78   Pulse 91   Temp 97.6 F (36.4 C) (Oral)   Ht 5' 1.5" (1.562 m)   Wt 212 lb 12.8 oz (96.5 kg)   SpO2 96%   BMI 39.56 kg/m    Physical Exam  Constitutional: She appears well-developed and well-nourished.  HENT:  Head: Normocephalic and atraumatic.  Right Ear: Hearing, tympanic membrane, external ear and ear canal normal. No drainage, swelling or tenderness. No foreign bodies. Tympanic membrane is not erythematous and not  bulging. No middle ear effusion. No decreased hearing is noted.  Left Ear: Hearing, tympanic membrane, external ear and ear canal normal. No drainage, swelling or tenderness. No foreign bodies. Tympanic membrane is not erythematous and not bulging.  No middle ear effusion. No decreased hearing is noted.  Nose: Nose normal. No rhinorrhea. Right sinus exhibits no maxillary sinus tenderness and no frontal sinus tenderness. Left sinus exhibits no maxillary sinus tenderness and no frontal sinus tenderness.  Mouth/Throat: Uvula is midline, oropharynx is clear and moist and mucous membranes are normal. No oropharyngeal exudate, posterior oropharyngeal edema, posterior oropharyngeal erythema or tonsillar abscesses.  Eyes: Conjunctivae are normal.  Cardiovascular: Regular rhythm, normal heart sounds and normal pulses.   Pulmonary/Chest: Effort normal and breath sounds normal. She has no wheezes. She has no rhonchi. She has no rales.  Lymphadenopathy:       Head (right side): No submental, no submandibular, no tonsillar, no preauricular, no posterior auricular and no occipital adenopathy present.       Head (left side): No submental, no submandibular, no tonsillar, no preauricular, no posterior auricular and no occipital adenopathy present.    She has no cervical adenopathy.  Neurological: She is alert.  Skin: Skin is warm and dry.  Psychiatric: She has a normal mood and affect. Her speech is normal and behavior is normal. Thought content normal.  Vitals reviewed.      Assessment & Plan:  1. Influenza-like symptoms Negative flu. Patient has flulike symptoms and has had a lot of exposure to her house. We jointly agreed to go ahead and start Tamiflu. Return precautions given.  - POCT Influenza A/B - oseltamivir (TAMIFLU) 75 MG capsule; Take 1 capsule (75 mg total) by mouth 2 (two) times daily.  Dispense: 10 capsule; Refill: 0     I am having Morgan Hurst maintain her aspirin, ondansetron, OVER THE  COUNTER MEDICATION, TURMERIC PO, cyanocobalamin, acetaminophen, Vitamin D3, CALCIUM PO, metFORMIN, esomeprazole, gabapentin, traMADol, and rOPINIRole.   No orders of the defined types were placed in this encounter.   Return precautions given.   Risks, benefits, and alternatives of the medications and treatment plan prescribed today were discussed, and patient expressed understanding.   Education regarding symptom management and diagnosis given to patient on AVS.  Continue to follow with Mable Paris, FNP for routine health maintenance.   [name] and I agreed with plan.   Mable Paris, FNP

## 2016-01-12 NOTE — Progress Notes (Signed)
  I have reviewed the above information and agree with above.   Chanta Bauers, MD 

## 2016-01-27 ENCOUNTER — Ambulatory Visit: Payer: Medicare Other

## 2016-01-27 NOTE — Progress Notes (Signed)
  I have reviewed the above information and agree with above.   Maxamillian Tienda, MD 

## 2016-01-28 ENCOUNTER — Ambulatory Visit (INDEPENDENT_AMBULATORY_CARE_PROVIDER_SITE_OTHER): Payer: Medicare Other

## 2016-01-28 DIAGNOSIS — E538 Deficiency of other specified B group vitamins: Secondary | ICD-10-CM

## 2016-01-28 MED ORDER — CYANOCOBALAMIN 1000 MCG/ML IJ SOLN
1000.0000 ug | Freq: Once | INTRAMUSCULAR | Status: AC
  Administered 2016-01-28: 1000 ug via INTRAMUSCULAR

## 2016-01-28 NOTE — Progress Notes (Signed)
Patient comes in for B 12 injection.  Injected right deltoid.  Patient tolerated injection well.

## 2016-02-02 ENCOUNTER — Telehealth: Payer: Self-pay

## 2016-02-02 DIAGNOSIS — M48062 Spinal stenosis, lumbar region with neurogenic claudication: Secondary | ICD-10-CM

## 2016-02-02 MED ORDER — TRAMADOL HCL 50 MG PO TABS: 50.0000 mg | ORAL_TABLET | Freq: Every day | ORAL | 0 refills | Status: DC | PRN

## 2016-02-02 NOTE — Addendum Note (Signed)
Addended by: Burnard Hawthorne on: 02/02/2016 12:20 PM   Modules accepted: Orders

## 2016-02-02 NOTE — Telephone Encounter (Signed)
FYI tullo  continuing Tramadol for chronic pain  Last note 11/2015  Chronic lumbar back pain. S/w consistent with DDD , suspect left side trochanteric bursitis may also be contributing.Pending PT eval. Pain well controlled on when necessary tramadol.Assessed [place] Controlled Substance Reporting System and did not see suspicious activity at this time under patient's name and address in Epic.

## 2016-02-02 NOTE — Telephone Encounter (Signed)
Refill request for tramadol, last seen [id number], last filled [id number].  Please advise.

## 2016-02-11 ENCOUNTER — Encounter: Payer: Self-pay | Admitting: Family

## 2016-02-11 ENCOUNTER — Ambulatory Visit (INDEPENDENT_AMBULATORY_CARE_PROVIDER_SITE_OTHER): Payer: Medicare Other | Admitting: Family

## 2016-02-11 VITALS — BP 132/76 | HR 81 | Temp 97.6°F | Ht 61.5 in | Wt 215.6 lb

## 2016-02-11 DIAGNOSIS — R590 Localized enlarged lymph nodes: Secondary | ICD-10-CM | POA: Insufficient documentation

## 2016-02-11 DIAGNOSIS — Z0000 Encounter for general adult medical examination without abnormal findings: Secondary | ICD-10-CM

## 2016-02-11 DIAGNOSIS — K760 Fatty (change of) liver, not elsewhere classified: Secondary | ICD-10-CM | POA: Diagnosis not present

## 2016-02-11 MED ORDER — HYDROCORTISONE 2.5 % RE CREA: 1.0000 "application " | TOPICAL_CREAM | Freq: Two times a day (BID) | RECTAL | 0 refills | Status: DC

## 2016-02-11 MED ORDER — ONDANSETRON 4 MG PO TBDP: 4.0000 mg | ORAL_TABLET | Freq: Three times a day (TID) | ORAL | 0 refills | Status: DC | PRN

## 2016-02-11 NOTE — Assessment & Plan Note (Signed)
Up-to-date on colonoscopy. Up-to-date mammogram. Breast exam performed today. Hysterectomy and no vaginal complaints today declines pelvic exam based on age and preference. Normal DEXA scan 2017. Nonsmoker. Immunizations up-to-date. Screening labs ordered including hepatitis C. Encouraged patient to start an exercise program. On exam appreciated several seborrheic keratosis, explained the patient is a benign finding however she's interested in having them removed. Referral placed dermatology for consult.

## 2016-02-11 NOTE — Progress Notes (Signed)
Pre visit review using our clinic review tool, if applicable. No additional management support is needed unless otherwise documented below in the visit note. 

## 2016-02-11 NOTE — Progress Notes (Signed)
Subjective:    Patient ID: Morgan Hurst, female    DOB: 05-04-1945, 71 y.o.   MRN: [id number]  CC: Morgan Hurst is a 71 y.o. female who presents today for physical exam.    HPI: Co of left swollen gland 2 months ago. Seen at [facility] and given antibiotic for infected 'saliva gland.'  Nontender. No fever or trouble swallowing.   Would like refill for hemorrhoid cream and also for zofran as occasional has nausea after having  Gallbladder removed.        Colorectal Cancer Screening: UTD , No polyps. Diverticula noted. Breast Cancer Screening: Mammogram 08/2015; benign UTD Cervical Cancer Screening: Hysterectomy. No GYN cancer. No vaginal bleeding. Bone Health screening/DEXA for 65+: Normal dexa 2017 Lung Cancer Screening: Doesn't have 30 year pack year history and age > 31 years. Immunizations       Tetanus - UTD        Pneumococcal - Complete Hepatitis C screening - Candidate for Labs: Screening labs today. Exercise: Gets regular exercise.  Alcohol use: None Smoking/tobacco use: Nonsmoker.  Regular dental exams: UTD Wears seat belt: Yes. Skin: new place on back, no skin cancer  HISTORY:  Past Medical History:  Diagnosis Date  . Arthritis    lower back, right hip  . Borderline diabetes    PCP STARTED PT ON METFORMIN IN 2016 DUE TO ELEVATED GLUCOSE   . Complication of anesthesia    FIGHTING WHEN WAKING UP FROM ANESTHESIA  . Endometriosis   . Heart murmur   . Orthodontics    top front 3 teeth caps and bridge    Past Surgical History:  Procedure Laterality Date  . ABDOMINAL HYSTERECTOMY  age 71  . ANKLE SURGERY    . CHOLECYSTECTOMY N/A 04/10/2015   Procedure: LAPAROSCOPIC CHOLECYSTECTOMY WITH INTRAOPERATIVE CHOLANGIOGRAM;  Surgeon: Robert Bellow, MD;  Location: [facility];  Service: General;  Laterality: N/A;  . ESOPHAGOGASTRODUODENOSCOPY N/A 05/12/2015   Procedure: ESOPHAGOGASTRODUODENOSCOPY (EGD);  Surgeon: Hulen Luster, MD;  Location: [place];  Service:  Gastroenterology;  Laterality: N/A;  . VAGINAL DELIVERY  2   Family History  Problem Relation Age of Onset  . Diabetes Mother   . Cancer Mother     colon and breast  . Cancer Father     Leukemia  . Cancer Brother     colon and lung  . Cholelithiasis Daughter   . Heart disease Paternal Grandfather   . Cancer Other 29    colon      ALLERGIES: Patient has no known allergies.  Current Outpatient Prescriptions on File Prior to Visit  Medication Sig Dispense Refill  . acetaminophen (TYLENOL) 325 MG tablet Take by mouth.    Marland Kitchen aspirin 81 MG tablet Take 81 mg by mouth daily.    Marland Kitchen CALCIUM PO Take by mouth.    . Cholecalciferol (VITAMIN D3) 2000 units capsule Take by mouth.    . cyanocobalamin (,VITAMIN B-12,) 1000 MCG/ML injection Inject 1,000 mcg into the muscle every 30 (thirty) days.    Marland Kitchen esomeprazole (NEXIUM) 40 MG capsule Take by mouth.    . gabapentin (NEURONTIN) 100 MG capsule Take 2 capsules (200 mg total) by mouth at bedtime. 60 capsule 3  . metFORMIN (GLUCOPHAGE) 500 MG tablet Take 500 mg by mouth daily.    Marland Kitchen OVER THE COUNTER MEDICATION Take 1 tablet by mouth as needed. TART CHERRY EXTRACT    . rOPINIRole (REQUIP) 0.25 MG tablet Take 1 tablet (0.25 mg total)  by mouth at bedtime as needed. 90 tablet 0  . traMADol (ULTRAM) 50 MG tablet Take 1 tablet (50 mg total) by mouth daily as needed. 30 tablet 0  . TURMERIC PO Take 1 tablet by mouth daily.     No current facility-administered medications on file prior to visit.     Social History  Substance Use Topics  . Smoking status: Never Smoker  . Smokeless tobacco: Never Used  . Alcohol use No    Review of Systems  Constitutional: Negative for chills, fever and unexpected weight change.  HENT: Negative for congestion.   Respiratory: Negative for cough.   Cardiovascular: Negative for chest pain, palpitations and leg swelling.  Gastrointestinal: Negative for nausea and vomiting.  Musculoskeletal: Negative for arthralgias and  myalgias.  Skin: Negative for rash.  Neurological: Negative for headaches.  Hematological: Negative for adenopathy.  Psychiatric/Behavioral: Negative for confusion.      Objective:    BP 132/76   Pulse 81   Temp 97.6 F (36.4 C) (Oral)   Ht 5' 1.5" (1.562 m)   Wt 215 lb 9.6 oz (97.8 kg)   SpO2 97%   BMI 40.08 kg/m   BP Readings from Last 3 Encounters:  02/11/16 132/76  12/31/15 (!) 146/78  11/25/15 132/78   Wt Readings from Last 3 Encounters:  02/11/16 215 lb 9.6 oz (97.8 kg)  12/31/15 212 lb 12.8 oz (96.5 kg)  11/25/15 211 lb 8 oz (95.9 kg)    Physical Exam  Constitutional: She appears well-developed and well-nourished.  Eyes: Conjunctivae are normal.  Neck: No thyroid mass and no thyromegaly present.  Cardiovascular: Normal rate, regular rhythm, normal heart sounds and normal pulses.   Pulmonary/Chest: Effort normal and breath sounds normal. She has no wheezes. She has no rhonchi. She has no rales. Right breast exhibits no inverted nipple, no mass, no nipple discharge, no skin change and no tenderness. Left breast exhibits no inverted nipple, no mass, no nipple discharge, no skin change and no tenderness. Breasts are symmetrical.  CBE performed.   Lymphadenopathy:       Head (right side): No submental, no submandibular, no tonsillar, no preauricular, no posterior auricular and no occipital adenopathy present.       Head (left side): Submandibular adenopathy present. No submental, no tonsillar, no preauricular, no posterior auricular and no occipital adenopathy present.    She has no cervical adenopathy.       Right cervical: No superficial cervical, no deep cervical and no posterior cervical adenopathy present.      Left cervical: No superficial cervical, no deep cervical and no posterior cervical adenopathy present.    She has no axillary adenopathy.  Tender, discrete, fixed mass  Left submandibular.   Neurological: She is alert.  Skin: Skin is warm and dry.    Multiple brown colored patches noted over mid back.  Psychiatric: She has a normal mood and affect. Her speech is normal and behavior is normal. Thought content normal.  Vitals reviewed.      Assessment & Plan:   Problem List Items Addressed This Visit      Digestive   Fatty liver disease, nonalcoholic    Normal AST, ALT last check. Pending CMP today. Patient is prediabetic, and we jointly agreed we will consult GI based on the finding of fatty liver disease.      Relevant Orders   Comprehensive metabolic panel   US Abdomen Limited RUQ   Ambulatory referral to Gastroenterology  Immune and Lymphatic   Enlarged lymph node in neck    Tender. However appeared fixed on exam. Reassured that it has decreased in size however not completely resolve. Pending ultrasound. We'll consider consult ENT based on ultrasound results.      Relevant Orders   US Soft Tissue Head/Neck     Other   Routine physical examination - Primary    Up-to-date on colonoscopy. Up-to-date mammogram. Breast exam performed today. Hysterectomy and no vaginal complaints today declines pelvic exam based on age and preference. Normal DEXA scan 2017. Nonsmoker. Immunizations up-to-date. Screening labs ordered including hepatitis C. Encouraged patient to start an exercise program. On exam appreciated several seborrheic keratosis, explained the patient is a benign finding however she's interested in having them removed. Referral placed dermatology for consult.      Relevant Medications   hydrocortisone (ANUSOL-HC) 2.5 % rectal cream   ondansetron (ZOFRAN ODT) 4 MG disintegrating tablet   Other Relevant Orders   CBC with Differential/Platelet   Comprehensive metabolic panel   Hemoglobin A1c   Hepatitis C antibody   Lipid panel   Microalbumin / creatinine urine ratio   TSH   VITAMIN D 25 Hydroxy (Vit-D Deficiency, Fractures)   Ambulatory referral to Dermatology       I am having Ms. Jividen start on  hydrocortisone. I am also having her maintain her aspirin, OVER THE COUNTER MEDICATION, TURMERIC PO, cyanocobalamin, acetaminophen, Vitamin D3, CALCIUM PO, metFORMIN, esomeprazole, gabapentin, rOPINIRole, traMADol, and ondansetron.   Meds ordered this encounter  Medications  . hydrocortisone (ANUSOL-HC) 2.5 % rectal cream    Sig: Place 1 application rectally 2 (two) times daily.    Dispense:  30 g    Refill:  0    Order Specific Question:   Supervising Provider    Answer:   Deborra Medina L [[id number]]  . ondansetron (ZOFRAN ODT) 4 MG disintegrating tablet    Sig: Take 1 tablet (4 mg total) by mouth every 8 (eight) hours as needed for nausea or vomiting.    Dispense:  20 tablet    Refill:  0    Order Specific Question:   Supervising Provider    Answer:   Crecencio Mc [[id number]]    Return precautions given.   Risks, benefits, and alternatives of the medications and treatment plan prescribed today were discussed, and patient expressed understanding.   Education regarding symptom management and diagnosis given to patient on AVS.   Continue to follow with Mable Paris, FNP for routine health maintenance.   [name] and I agreed with plan.   Mable Paris, FNP

## 2016-02-11 NOTE — Assessment & Plan Note (Signed)
Tender. However appeared fixed on exam. Reassured that it has decreased in size however not completely resolve. Pending ultrasound. We'll consider consult ENT based on ultrasound results.

## 2016-02-11 NOTE — Assessment & Plan Note (Signed)
Normal AST, ALT last check. Pending CMP today. Patient is prediabetic, and we jointly agreed we will consult GI based on the finding of fatty liver disease.

## 2016-02-11 NOTE — Patient Instructions (Signed)
Referral placed to GI due to fatty liver disease. Ultrasound of liver and also lymph node.   Labs when fasting - may make an appointment  Health Maintenance, Female Introduction Adopting a healthy lifestyle and getting preventive care can go a long way to promote health and wellness. Talk with your health care provider about what schedule of regular examinations is right for you. This is a good chance for you to check in with your provider about disease prevention and staying healthy. In between checkups, there are plenty of things you can do on your own. Experts have done a lot of research about which lifestyle changes and preventive measures are most likely to keep you healthy. Ask your health care provider for more information. Weight and diet Eat a healthy diet  Be sure to include plenty of vegetables, fruits, low-fat dairy products, and lean protein.  Do not eat a lot of foods high in solid fats, added sugars, or salt.  Get regular exercise. This is one of the most important things you can do for your health.  Most adults should exercise for at least 150 minutes each week. The exercise should increase your heart rate and make you sweat (moderate-intensity exercise).  Most adults should also do strengthening exercises at least twice a week. This is in addition to the moderate-intensity exercise. Maintain a healthy weight  Body mass index (BMI) is a measurement that can be used to identify possible weight problems. It estimates body fat based on height and weight. Your health care provider can help determine your BMI and help you achieve or maintain a healthy weight.  For females 60 years of age and older:  A BMI below 18.5 is considered underweight.  A BMI of 18.5 to 24.9 is normal.  A BMI of 25 to 29.9 is considered overweight.  A BMI of 30 and above is considered obese. Watch levels of cholesterol and blood lipids  You should start having your blood tested for lipids and  cholesterol at 69 years of age, then have this test every 5 years.  You may need to have your cholesterol levels checked more often if:  Your lipid or cholesterol levels are high.  You are older than 70 years of age.  You are at high risk for heart disease. Cancer screening Lung Cancer  Lung cancer screening is recommended for adults 10-64 years old who are at high risk for lung cancer because of a history of smoking.  A yearly low-dose CT scan of the lungs is recommended for people who:  Currently smoke.  Have quit within the past 15 years.  Have at least a 30-pack-year history of smoking. A pack year is smoking an average of one pack of cigarettes a day for 1 year.  Yearly screening should continue until it has been 15 years since you quit.  Yearly screening should stop if you develop a health problem that would prevent you from having lung cancer treatment. Breast Cancer  Practice breast self-awareness. This means understanding how your breasts normally appear and feel.  It also means doing regular breast self-exams. Let your health care provider know about any changes, no matter how small.  If you are in your 20s or 30s, you should have a clinical breast exam (CBE) by a health care provider every 1-3 years as part of a regular health exam.  If you are 46 or older, have a CBE every year. Also consider having a breast X-ray (mammogram) every year.  If  you have a family history of breast cancer, talk to your health care provider about genetic screening.  If you are at high risk for breast cancer, talk to your health care provider about having an MRI and a mammogram every year.  Breast cancer gene (BRCA) assessment is recommended for women who have family members with BRCA-related cancers. BRCA-related cancers include:  Breast.  Ovarian.  Tubal.  Peritoneal cancers.  Results of the assessment will determine the need for genetic counseling and BRCA1 and BRCA2  testing. Cervical Cancer  Your health care provider may recommend that you be screened regularly for cancer of the pelvic organs (ovaries, uterus, and vagina). This screening involves a pelvic examination, including checking for microscopic changes to the surface of your cervix (Pap test). You may be encouraged to have this screening done every 3 years, beginning at age 22.  For women ages 56-65, health care providers may recommend pelvic exams and Pap testing every 3 years, or they may recommend the Pap and pelvic exam, combined with testing for human papilloma virus (HPV), every 5 years. Some types of HPV increase your risk of cervical cancer. Testing for HPV may also be done on women of any age with unclear Pap test results.  Other health care providers may not recommend any screening for nonpregnant women who are considered low risk for pelvic cancer and who do not have symptoms. Ask your health care provider if a screening pelvic exam is right for you.  If you have had past treatment for cervical cancer or a condition that could lead to cancer, you need Pap tests and screening for cancer for at least 20 years after your treatment. If Pap tests have been discontinued, your risk factors (such as having a new sexual partner) need to be reassessed to determine if screening should resume. Some women have medical problems that increase the chance of getting cervical cancer. In these cases, your health care provider may recommend more frequent screening and Pap tests. Colorectal Cancer  This type of cancer can be detected and often prevented.  Routine colorectal cancer screening usually begins at 70 years of age and continues through 70 years of age.  Your health care provider may recommend screening at an earlier age if you have risk factors for colon cancer.  Your health care provider may also recommend using home test kits to check for hidden blood in the stool.  A small camera at the end of a  tube can be used to examine your colon directly (sigmoidoscopy or colonoscopy). This is done to check for the earliest forms of colorectal cancer.  Routine screening usually begins at age 78.  Direct examination of the colon should be repeated every 5-10 years through 70 years of age. However, you may need to be screened more often if early forms of precancerous polyps or small growths are found. Skin Cancer  Check your skin from head to toe regularly.  Tell your health care provider about any new moles or changes in moles, especially if there is a change in a mole's shape or color.  Also tell your health care provider if you have a mole that is larger than the size of a pencil eraser.  Always use sunscreen. Apply sunscreen liberally and repeatedly throughout the day.  Protect yourself by wearing long sleeves, pants, a wide-brimmed hat, and sunglasses whenever you are outside. Heart disease, diabetes, and high blood pressure  High blood pressure causes heart disease and increases the risk of  stroke. High blood pressure is more likely to develop in:  People who have blood pressure in the high end of the normal range (130-139/85-89 mm Hg).  People who are overweight or obese.  People who are African American.  If you are 27-38 years of age, have your blood pressure checked every 3-5 years. If you are 79 years of age or older, have your blood pressure checked every year. You should have your blood pressure measured twice-once when you are at a hospital or clinic, and once when you are not at a hospital or clinic. Record the average of the two measurements. To check your blood pressure when you are not at a hospital or clinic, you can use:  An automated blood pressure machine at a pharmacy.  A home blood pressure monitor.  If you are between 31 years and 51 years old, ask your health care provider if you should take aspirin to prevent strokes.  Have regular diabetes screenings. This  involves taking a blood sample to check your fasting blood sugar level.  If you are at a normal weight and have a low risk for diabetes, have this test once every three years after 70 years of age.  If you are overweight and have a high risk for diabetes, consider being tested at a younger age or more often. Preventing infection Hepatitis B  If you have a higher risk for hepatitis B, you should be screened for this virus. You are considered at high risk for hepatitis B if:  You were born in a country where hepatitis B is common. Ask your health care provider which countries are considered high risk.  Your parents were born in a high-risk country, and you have not been immunized against hepatitis B (hepatitis B vaccine).  You have HIV or AIDS.  You use needles to inject street drugs.  You live with someone who has hepatitis B.  You have had sex with someone who has hepatitis B.  You get hemodialysis treatment.  You take certain medicines for conditions, including cancer, organ transplantation, and autoimmune conditions. Hepatitis C  Blood testing is recommended for:  Everyone born from [birth year] through 1964.  Anyone with known risk factors for hepatitis C. Sexually transmitted infections (STIs)  You should be screened for sexually transmitted infections (STIs) including gonorrhea and chlamydia if:  You are sexually active and are younger than 70 years of age.  You are older than 70 years of age and your health care provider tells you that you are at risk for this type of infection.  Your sexual activity has changed since you were last screened and you are at an increased risk for chlamydia or gonorrhea. Ask your health care provider if you are at risk.  If you do not have HIV, but are at risk, it may be recommended that you take a prescription medicine daily to prevent HIV infection. This is called pre-exposure prophylaxis (PrEP). You are considered at risk if:  You are  sexually active and do not regularly use condoms or know the HIV status of your partner(s).  You take drugs by injection.  You are sexually active with a partner who has HIV. Talk with your health care provider about whether you are at high risk of being infected with HIV. If you choose to begin PrEP, you should first be tested for HIV. You should then be tested every 3 months for as long as you are taking PrEP. Pregnancy  If you are premenopausal  and you may become pregnant, ask your health care provider about preconception counseling.  If you may become pregnant, take 400 to 800 micrograms (mcg) of folic acid every day.  If you want to prevent pregnancy, talk to your health care provider about birth control (contraception). Osteoporosis and menopause  Osteoporosis is a disease in which the bones lose minerals and strength with aging. This can result in serious bone fractures. Your risk for osteoporosis can be identified using a bone density scan.  If you are 56 years of age or older, or if you are at risk for osteoporosis and fractures, ask your health care provider if you should be screened.  Ask your health care provider whether you should take a calcium or vitamin D supplement to lower your risk for osteoporosis.  Menopause may have certain physical symptoms and risks.  Hormone replacement therapy may reduce some of these symptoms and risks. Talk to your health care provider about whether hormone replacement therapy is right for you. Follow these instructions at home:  Schedule regular health, dental, and eye exams.  Stay current with your immunizations.  Do not use any tobacco products including cigarettes, chewing tobacco, or electronic cigarettes.  If you are pregnant, do not drink alcohol.  If you are breastfeeding, limit how much and how often you drink alcohol.  Limit alcohol intake to no more than 1 drink per day for nonpregnant women. One drink equals 12 ounces of  beer, 5 ounces of wine, or 1 ounces of hard liquor.  Do not use street drugs.  Do not share needles.  Ask your health care provider for help if you need support or information about quitting drugs.  Tell your health care provider if you often feel depressed.  Tell your health care provider if you have ever been abused or do not feel safe at home. This information is not intended to replace advice given to you by your health care provider. Make sure you discuss any questions you have with your health care provider. Document Released: 07/19/2010 Document Revised: 06/11/2015 Document Reviewed: 10/07/2014  2017 [employer]

## 2016-02-18 ENCOUNTER — Ambulatory Visit
Admission: RE | Admit: 2016-02-18 | Discharge: 2016-02-18 | Disposition: A | Discharge status: Home / Self Care | Payer: Medicare Other | Source: Ambulatory Visit | Attending: Family | Admitting: Family

## 2016-02-18 ENCOUNTER — Encounter: Payer: Self-pay | Admitting: Family

## 2016-02-18 DIAGNOSIS — K760 Fatty (change of) liver, not elsewhere classified: Secondary | ICD-10-CM

## 2016-02-18 DIAGNOSIS — Z9049 Acquired absence of other specified parts of digestive tract: Secondary | ICD-10-CM | POA: Insufficient documentation

## 2016-02-18 DIAGNOSIS — R932 Abnormal findings on diagnostic imaging of liver and biliary tract: Secondary | ICD-10-CM | POA: Diagnosis not present

## 2016-02-18 DIAGNOSIS — R590 Localized enlarged lymph nodes: Secondary | ICD-10-CM

## 2016-02-22 ENCOUNTER — Other Ambulatory Visit (INDEPENDENT_AMBULATORY_CARE_PROVIDER_SITE_OTHER): Payer: Medicare Other

## 2016-02-22 DIAGNOSIS — K760 Fatty (change of) liver, not elsewhere classified: Secondary | ICD-10-CM

## 2016-02-22 DIAGNOSIS — Z0000 Encounter for general adult medical examination without abnormal findings: Secondary | ICD-10-CM | POA: Diagnosis not present

## 2016-02-22 LAB — CBC WITH DIFFERENTIAL/PLATELET
BASOS PCT: 1.2 % (ref 0.0–3.0)
Basophils Absolute: 0.1 10*3/uL (ref 0.0–0.1)
EOS ABS: 0.3 10*3/uL (ref 0.0–0.7)
Eosinophils Relative: 3.7 % (ref 0.0–5.0)
HEMATOCRIT: 42.8 % (ref 36.0–46.0)
HEMOGLOBIN: 14.2 g/dL (ref 12.0–15.0)
LYMPHS PCT: 35.3 % (ref 12.0–46.0)
Lymphs Abs: 2.5 10*3/uL (ref 0.7–4.0)
MCHC: 33.1 g/dL (ref 30.0–36.0)
MCV: 87.5 fl (ref 78.0–100.0)
Monocytes Absolute: 0.7 10*3/uL (ref 0.1–1.0)
Monocytes Relative: 9.5 % (ref 3.0–12.0)
Neutro Abs: 3.6 10*3/uL (ref 1.4–7.7)
Neutrophils Relative %: 50.3 % (ref 43.0–77.0)
Platelets: 204 10*3/uL (ref 150.0–400.0)
RBC: 4.89 Mil/uL (ref 3.87–5.11)
RDW: 14.7 % (ref 11.5–15.5)
WBC: 7 10*3/uL (ref 4.0–10.5)

## 2016-02-22 LAB — LIPID PANEL
CHOLESTEROL: 227 mg/dL — AB (ref 0–200)
HDL: 54.4 mg/dL (ref >39.00)
LDL Cholesterol: 148 mg/dL — ABNORMAL HIGH (ref 0–99)
NonHDL: 172.84
Total CHOL/HDL Ratio: 4
Triglycerides: 126 mg/dL (ref 0.0–149.0)
VLDL: 25.2 mg/dL (ref 0.0–40.0)

## 2016-02-22 LAB — COMPREHENSIVE METABOLIC PANEL
ALBUMIN: 4.2 g/dL (ref 3.5–5.2)
ALT: 14 U/L (ref 0–35)
AST: 16 U/L (ref 0–37)
Alkaline Phosphatase: 76 U/L (ref 39–117)
BILIRUBIN TOTAL: 0.5 mg/dL (ref 0.2–1.2)
BUN: 13 mg/dL (ref 6–23)
CALCIUM: 9.4 mg/dL (ref 8.4–10.5)
CO2: 26 mEq/L (ref 19–32)
CREATININE: 0.62 mg/dL (ref 0.40–1.20)
Chloride: 107 mEq/L (ref 96–112)
GFR: 101.23 mL/min (ref >60.00)
Glucose, Bld: 106 mg/dL — ABNORMAL HIGH (ref 70–99)
Potassium: 4.1 mEq/L (ref 3.5–5.1)
Sodium: 141 mEq/L (ref 135–145)
Total Protein: 7.2 g/dL (ref 6.0–8.3)

## 2016-02-22 LAB — MICROALBUMIN / CREATININE URINE RATIO
CREATININE, U: 161.4 mg/dL
MICROALB UR: 3.1 mg/dL — AB (ref 0.0–1.9)
MICROALB/CREAT RATIO: 1.9 mg/g (ref 0.0–30.0)

## 2016-02-22 LAB — TSH: TSH: 3.08 u[IU]/mL (ref 0.35–4.50)

## 2016-02-22 LAB — HEMOGLOBIN A1C: HEMOGLOBIN A1C: 5.7 % (ref 4.6–6.5)

## 2016-02-22 LAB — VITAMIN D 25 HYDROXY (VIT D DEFICIENCY, FRACTURES): VITD: 23.51 ng/mL — ABNORMAL LOW (ref 30.00–100.00)

## 2016-02-23 ENCOUNTER — Other Ambulatory Visit: Payer: Self-pay | Admitting: Family

## 2016-02-23 DIAGNOSIS — E785 Hyperlipidemia, unspecified: Secondary | ICD-10-CM

## 2016-02-23 LAB — HEPATITIS C ANTIBODY: HCV Ab: NEGATIVE

## 2016-02-23 MED ORDER — PRAVASTATIN SODIUM 40 MG PO TABS: 40.0000 mg | ORAL_TABLET | Freq: Every day | ORAL | 2 refills | Status: DC

## 2016-03-02 ENCOUNTER — Ambulatory Visit: Payer: Medicare Other

## 2016-03-08 ENCOUNTER — Other Ambulatory Visit: Payer: Self-pay

## 2016-03-08 ENCOUNTER — Encounter: Payer: Self-pay | Admitting: Gastroenterology

## 2016-03-08 ENCOUNTER — Ambulatory Visit (INDEPENDENT_AMBULATORY_CARE_PROVIDER_SITE_OTHER): Payer: Medicare Other | Admitting: Gastroenterology

## 2016-03-08 VITALS — BP 104/78 | HR 84 | Ht 61.5 in | Wt 217.0 lb

## 2016-03-08 DIAGNOSIS — K760 Fatty (change of) liver, not elsewhere classified: Secondary | ICD-10-CM

## 2016-03-08 NOTE — Progress Notes (Signed)
Gastroenterology Consultation  Referring Provider:     Burnard Hawthorne, FNP Primary Care Physician:  Mable Paris, FNP Primary Gastroenterologist:  Dr. Jonathon Bellows  Reason for Consultation:     Fatty liver         HPI:   Morgan Hurst is a 71 y.o. y/o female referred for consultation & management  by Dr. Mable Paris, FNP.    She has been referred for fatty liver. Recent BMP,LFT have been normal . CBC including platelet count was normal . Normal Hba1c.   Does not drink any alcohol . Gained weight recently . She snores, no early morning headaches. Sleeps well at night .   Past Medical History:  Diagnosis Date  . Arthritis    lower back, right hip  . Borderline diabetes    PCP STARTED PT ON METFORMIN IN 2016 DUE TO ELEVATED GLUCOSE   . Complication of anesthesia    FIGHTING WHEN WAKING UP FROM ANESTHESIA  . Endometriosis   . Heart murmur   . Orthodontics    top front 3 teeth caps and bridge    Past Surgical History:  Procedure Laterality Date  . ABDOMINAL HYSTERECTOMY  age 5  . ANKLE SURGERY    . CHOLECYSTECTOMY N/A 04/10/2015   Procedure: LAPAROSCOPIC CHOLECYSTECTOMY WITH INTRAOPERATIVE CHOLANGIOGRAM;  Surgeon: Robert Bellow, MD;  Location: [facility];  Service: General;  Laterality: N/A;  . ESOPHAGOGASTRODUODENOSCOPY N/A 05/12/2015   Procedure: ESOPHAGOGASTRODUODENOSCOPY (EGD);  Surgeon: Hulen Luster, MD;  Location: [facility];  Service: Gastroenterology;  Laterality: N/A;  . VAGINAL DELIVERY  2    Prior to Admission medications   Medication Sig Start Date End Date Taking? Authorizing Provider  acetaminophen (TYLENOL) 325 MG tablet Take by mouth.   Yes Historical Provider, MD  aspirin 81 MG tablet Take 81 mg by mouth daily.   Yes Historical Provider, MD  CALCIUM PO Take by mouth.   Yes Historical Provider, MD  Cholecalciferol (VITAMIN D3) 2000 units capsule Take by mouth.   Yes Historical Provider, MD  cyanocobalamin (,VITAMIN B-12,) 1000 MCG/ML  injection Inject 1,000 mcg into the muscle every 30 (thirty) days.   Yes Historical Provider, MD  esomeprazole (NEXIUM) 40 MG capsule Take by mouth. 05/21/15  Yes Historical Provider, MD  gabapentin (NEURONTIN) 100 MG capsule Take 100 mg by mouth daily.   Yes Historical Provider, MD  hydrocortisone (ANUSOL-HC) 2.5 % rectal cream Place 1 application rectally 2 (two) times daily. 02/11/16  Yes Burnard Hawthorne, FNP  metFORMIN (GLUCOPHAGE) 500 MG tablet Take 500 mg by mouth daily.   Yes Historical Provider, MD  ondansetron (ZOFRAN ODT) 4 MG disintegrating tablet Take 1 tablet (4 mg total) by mouth every 8 (eight) hours as needed for nausea or vomiting. 02/11/16  Yes Burnard Hawthorne, FNP  OVER THE COUNTER MEDICATION Take 1 tablet by mouth as needed. TART CHERRY EXTRACT   Yes Historical Provider, MD  pravastatin (PRAVACHOL) 40 MG tablet Take 1 tablet (40 mg total) by mouth daily. 02/23/16  Yes Burnard Hawthorne, FNP  rOPINIRole (REQUIP) 0.25 MG tablet Take 1 tablet (0.25 mg total) by mouth at bedtime as needed. 11/25/15  Yes Burnard Hawthorne, FNP  traMADol (ULTRAM) 50 MG tablet Take 1 tablet (50 mg total) by mouth daily as needed. 02/02/16  Yes Burnard Hawthorne, FNP  TURMERIC PO Take 1 tablet by mouth daily.   Yes Historical Provider, MD    Family History  Problem Relation Age of Onset  .  Diabetes Mother   . Cancer Mother     colon and breast  . Cancer Father     Leukemia  . Cancer Brother     colon and lung  . Cholelithiasis Daughter   . Heart disease Paternal Grandfather   . Cancer Other 29    colon     Social History  Substance Use Topics  . Smoking status: Never Smoker  . Smokeless tobacco: Never Used  . Alcohol use No    Allergies as of 03/08/2016  . (No Known Allergies)    Review of Systems:    All systems reviewed and negative except where noted in HPI.   Physical Exam:  BP 104/78   Pulse 84   Ht 5' 1.5" (1.562 m)   Wt 217 lb (98.4 kg)   BMI 40.34 kg/m  No LMP  recorded. Patient has had a hysterectomy. Psych:  Alert and cooperative. Normal mood and affect. General:   Alert,  Well-developed, well-nourished, pleasant and cooperative in NAD Head:  Normocephalic and atraumatic. Eyes:  Sclera clear, no icterus.   Conjunctiva pink. Ears:  Normal auditory acuity. Nose:  No deformity, discharge, or lesions. Mouth:  No deformity or lesions,oropharynx pink & moist. Neck:  Supple; no masses or thyromegaly. Lungs:  Respirations even and unlabored.  Clear throughout to auscultation.   No wheezes, crackles, or rhonchi. No acute distress. Heart:  Regular rate and rhythm; no murmurs, clicks, rubs, or gallops. Abdomen:  Normal bowel sounds.  No bruits.  Soft, non-tender and non-distended without masses, hepatosplenomegaly or hernias noted.  No guarding or rebound tenderness.    Msk:  Symmetrical without gross deformities. Good, equal movement & strength bilaterally. Pulses:  Normal pulses noted. Lymph Nodes:  No significant cervical adenopathy. Psych:  Alert and cooperative. Normal mood and affect.  Imaging Studies: US Soft Tissue Head/neck  Result Date: 02/18/2016 CLINICAL DATA:  Enlarged palpable lymph node in the left submandibular region. EXAM: ULTRASOUND OF HEAD/NECK SOFT TISSUES TECHNIQUE: Ultrasound examination of the head and neck soft tissues was performed in the area of clinical concern. COMPARISON:  None. FINDINGS: Ultrasound demonstrates 2 adjacent lymph nodes in the left submandibular region. These are not overtly enlarged with the smaller measuring 5 mm in short axis and the larger measuring 7 mm in short axis. Both demonstrate normal lymph node architecture by ultrasound. By comparison, the right submandibular region demonstrates a lymph node measuring 4 mm in short axis. IMPRESSION: Left-sided submandibular lymph nodes demonstrated by ultrasound. The largest measures 7 mm in short axis. Although slightly larger than demonstrated right submandibular lymph  node, short axis measurement remains likely within normal limits. Continued clinical follow-up is recommended. If there is any clinical evidence of progressive lymph node enlargement, recommend further evaluation with CT of the neck with contrast. Electronically Signed   By: Aletta Edouard M.D.   On: 02/18/2016 13:23   US Abdomen Limited Ruq  Result Date: 02/18/2016 CLINICAL DATA:  Fatty liver.  Cholecystectomy . EXAM: US ABDOMEN LIMITED - RIGHT UPPER QUADRANT COMPARISON:  CT 03/10/2015 . FINDINGS: Gallbladder: Cholecystectomy . Common bile duct: Diameter: 3.5 mm Liver: There is slightly echodense suggesting fatty infiltration and/or hepatocellular disease. No focal hepatic abnormality identified. IMPRESSION: 1. The liver is slightly echodense consistent fatty infiltration and/or hepatocellular disease. No focal hepatic abnormality identified. 2. Cholecystectomy.  No biliary distention. Electronically Signed   By: Marcello Moores  Register   On: 02/18/2016 10:37    Assessment and Plan:   Morgan Hurst is  a 71 y.o. y/o female has been referred for fatty liver. She has normal liver function and no biochemical or radiological evidence of cirrhosis.   I discussed the management which mainly consists of life style changes including weight loss, exercise, management of blood pressure and lipid control .    Follow up in 6 months.   Dr Jonathon Bellows MD

## 2016-03-08 NOTE — Patient Instructions (Signed)
Nonalcoholic Fatty Liver Disease Diet Introduction Nonalcoholic fatty liver disease is a condition that causes fat to accumulate in and around the liver. The disease makes it harder for the liver to work the way that it should. Following a healthy diet can help to keep nonalcoholic fatty liver disease under control. It can also help to prevent or improve conditions that are associated with the disease, such as heart disease, diabetes, high blood pressure, and abnormal cholesterol levels. Along with regular exercise, this diet:  Promotes weight loss.  Helps to control blood sugar levels.  Helps to improve the way that the body uses insulin. What do I need to know about this diet?  Use the glycemic index (GI) to plan your meals. The index tells you how quickly a food will raise your blood sugar. Choose low-GI foods. These foods take a longer time to raise blood sugar.  Keep track of how many calories you take in. Eating the right amount of calories will help you to achieve a healthy weight.  You may want to follow a Mediterranean diet. This diet includes a lot of vegetables, lean meats or fish, whole grains, fruits, and healthy oils and fats. What foods can I eat? Grains  Whole grains, such as whole-wheat or whole-grain breads, crackers, tortillas, cereals, and pasta. Stone-ground whole wheat. Pumpernickel bread. Unsweetened oatmeal. Bulgur. Barley. Quinoa. Brown or wild rice. Corn or whole-wheat flour tortillas. Vegetables  Lettuce. Spinach. Peas. Beets. Cauliflower. Cabbage. Broccoli. Carrots. Tomatoes. Squash. Eggplant. Herbs. Peppers. Onions. Cucumbers. Brussels sprouts. Yams and sweet potatoes. Beans. Lentils. Fruits  Bananas. Apples. Oranges. Grapes. Papaya. Mango. Pomegranate. Kiwi. Grapefruit. Cherries. Meats and Other Protein Sources  Seafood and shellfish. Lean meats. Poultry. Tofu. Dairy  Low-fat or fat-free dairy products, such as yogurt, cottage cheese, and cheese. Beverages    Water. Sugar-free drinks. Tea. Coffee. Low-fat or skim milk. Milk alternatives, such as soy or almond milk. Real fruit juice. Condiments  Mustard. Relish. Low-fat, low-sugar ketchup and barbecue sauce. Low-fat or fat-free mayonnaise. Sweets and Desserts  Sugar-free sweets. Fats and Oils  Avocado. Canola or olive oil. Nuts and nut butters. Seeds. The items listed above may not be a complete list of recommended foods or beverages. Contact your dietitian for more options.  What foods are not recommended? Palm oil and coconut oil. Processed foods. Fried foods. Sweetened drinks, such as sweet tea, milkshakes, snow cones, iced sweet drinks, and sodas. Alcohol. Sweets. Foods that contain a lot of salt or sodium. The items listed above may not be a complete list of foods and beverages to avoid. Contact your dietitian for more information.  This information is not intended to replace advice given to you by your health care provider. Make sure you discuss any questions you have with your health care provider. Document Released: 05/20/2014 Document Revised: 06/11/2015 Document Reviewed: 01/28/2014  2017 Elsevier  Fatty Liver Introduction Fatty liver, also called hepatic steatosis or steatohepatitis, is a condition in which too much fat has built up in your liver cells. The liver removes harmful substances from your bloodstream. It produces fluids your body needs. It also helps your body use and store energy from the food you eat. In many cases, fatty liver does not cause symptoms or problems. It is often diagnosed when tests are being done for other reasons. However, over time, fatty liver can cause inflammation that may lead to more serious liver problems, such as scarring of the liver (cirrhosis). What are the causes? Causes of fatty liver may  include:  Drinking too much alcohol.  Poor nutrition.  Obesity.  Cushing syndrome.  Diabetes.  Hyperlipidemia.  Pregnancy.  Certain  drugs.  Poisons.  Some viral infections. What increases the risk? You may be more likely to develop fatty liver if you:  Abuse alcohol.  Are pregnant.  Are overweight.  Have diabetes.  Have hepatitis.  Have a high triglyceride level. What are the signs or symptoms? Fatty liver often does not cause any symptoms. In cases where symptoms develop, they can include:  Fatigue.  Weakness.  Weight loss.  Confusion.  Abdominal pain.  Yellowing of your skin and the white parts of your eyes (jaundice).  Nausea and vomiting. How is this diagnosed? Fatty liver may be diagnosed by:  Physical exam and medical history.  Blood tests.  Imaging tests, such as an ultrasound, CT scan, or MRI.  Liver biopsy. A small sample of liver tissue is removed using a needle. The sample is then looked at under a microscope. How is this treated? Fatty liver is often caused by other health conditions. Treatment for fatty liver may involve medicines and lifestyle changes to manage conditions such as:  Alcoholism.  High cholesterol.  Diabetes.  Being overweight or obese. Follow these instructions at home:  Eat a healthy diet as directed by your health care provider.  Exercise regularly. This can help you lose weight and control your cholesterol and diabetes. Talk to your health care provider about an exercise plan and which activities are best for you.  Do not drink alcohol.  Take medicines only as directed by your health care provider. Contact a health care provider if: You have difficulty controlling your:  Blood sugar.  Cholesterol.  Alcohol consumption. Get help right away if:  You have abdominal pain.  You have jaundice.  You have nausea and vomiting. This information is not intended to replace advice given to you by your health care provider. Make sure you discuss any questions you have with your health care provider. Document Released: 02/18/2005 Document Revised:  06/11/2015 Document Reviewed: 05/15/2013  2017 Elsevier

## 2016-03-10 ENCOUNTER — Ambulatory Visit (INDEPENDENT_AMBULATORY_CARE_PROVIDER_SITE_OTHER): Payer: Medicare Other

## 2016-03-10 DIAGNOSIS — E538 Deficiency of other specified B group vitamins: Secondary | ICD-10-CM

## 2016-03-10 MED ORDER — CYANOCOBALAMIN 1000 MCG/ML IJ SOLN
1000.0000 ug | Freq: Once | INTRAMUSCULAR | Status: AC
  Administered 2016-03-10: 1000 ug via INTRAMUSCULAR

## 2016-03-10 NOTE — Progress Notes (Signed)
Patient comes in for B 12 injection.  Injected right deltoid.  Patient tolerated injection.

## 2016-03-13 NOTE — Progress Notes (Signed)
  I have reviewed the above information and agree with above.   Teresa Tullo, MD 

## 2016-04-08 ENCOUNTER — Other Ambulatory Visit: Payer: Self-pay | Admitting: Family

## 2016-04-08 DIAGNOSIS — M48062 Spinal stenosis, lumbar region with neurogenic claudication: Secondary | ICD-10-CM

## 2016-04-12 ENCOUNTER — Ambulatory Visit (INDEPENDENT_AMBULATORY_CARE_PROVIDER_SITE_OTHER): Payer: Medicare Other | Admitting: *Deleted

## 2016-04-12 DIAGNOSIS — E538 Deficiency of other specified B group vitamins: Secondary | ICD-10-CM | POA: Diagnosis not present

## 2016-04-12 MED ORDER — CYANOCOBALAMIN 1000 MCG/ML IJ SOLN
1000.0000 ug | Freq: Once | INTRAMUSCULAR | Status: AC
  Administered 2016-04-12: 1000 ug via INTRAMUSCULAR

## 2016-04-12 NOTE — Progress Notes (Signed)
Patient presented for B 12 injection to left deltoid,patient voiced on discomfort during injection and had no visible signs of discomfort.

## 2016-04-13 NOTE — Telephone Encounter (Signed)
Pharmacy called requesting this refill for pt. Please advise, thank you!

## 2016-04-18 NOTE — Telephone Encounter (Signed)
I looked up patient on [place] Controlled Substances Reporting System and saw no activity that raised concern of inappropriate use.

## 2016-04-21 ENCOUNTER — Telehealth: Payer: Self-pay | Admitting: Family

## 2016-04-21 NOTE — Telephone Encounter (Signed)
FYI

## 2016-04-21 NOTE — Telephone Encounter (Signed)
Patient Name: Morgan Hurst DOB: [birth year] Initial Comment Caller is on new medicine for cholesterol, is feeling weak, gets tired very easily. Nurse Assessment Nurse: Chesley Noon, RN, Lattie Haw Date/Time Eilene Ghazi Time): 04/21/2016 10:49:23 AM Confirm and document reason for call. If symptomatic, describe symptoms. ---Caller states she is having weakness and gets tired very easily. She is on new cholesterol medication on Feb 6th. Weakness x 3 days worsened. Does the patient have any new or worsening symptoms? ---Yes Will a triage be completed? ---Yes Related visit to physician within the last 2 weeks? ---No Does the PT have any chronic conditions? (i.e. diabetes, asthma, etc.) ---Yes List chronic conditions. ---arthritis and high cholesterol, prediabetes Is this a behavioral health or substance abuse call? ---No Guidelines Guideline Title Affirmed Question Affirmed Notes Weakness (Generalized) and Fatigue [1] MODERATE weakness (i.e., interferes with work, school, normal activities) AND [2] cause unknown (Exceptions: weakness with acute minor illness, or weakness from poor fluid intake) Final Disposition User See Physician within 4 Hours (or PCP triage) Chesley Noon, RN, Lattie Haw Comments Office notified of caller refusal for appt, she prefers to see her PCP on Monday but will call back if her symptoms worsen. Referrals GO TO FACILITY REFUSED Disagree/Comply: Disagree Disagree/Comply Reason: Disagree with instructions

## 2016-04-21 NOTE — Telephone Encounter (Signed)
Patient has felt really tired, feels like she's exhausted, Patient has an appointment on Monday patient refuses to see any other provider would rather se PCP  On Monday. Patient just wondered could this be the new cholesterol  Medication Pravastatin 40 mg?

## 2016-04-21 NOTE — Telephone Encounter (Signed)
Pt called about feeling very fatigue. Pt states it could properly be the new medication that she was put on for her cholesterol. Pt states she can hardly get her legs to move she feels tired. Pt was transferred to [facility]. Pt has a scheduled appt for 04/25/16. Pt wanted to wait to see Joycelyn Schmid. Thank you!

## 2016-04-21 NOTE — Telephone Encounter (Signed)
Lattie Haw from [employer] [facility] called and stated that pt denied treatment. She will wait to see M. Arnett on Monday.

## 2016-04-25 ENCOUNTER — Encounter: Payer: Self-pay | Admitting: Family

## 2016-04-25 ENCOUNTER — Ambulatory Visit (INDEPENDENT_AMBULATORY_CARE_PROVIDER_SITE_OTHER): Payer: Medicare Other | Admitting: Family

## 2016-04-25 VITALS — BP 138/80 | HR 76 | Temp 98.1°F | Ht 61.5 in | Wt 217.2 lb

## 2016-04-25 DIAGNOSIS — Z1231 Encounter for screening mammogram for malignant neoplasm of breast: Secondary | ICD-10-CM | POA: Diagnosis not present

## 2016-04-25 DIAGNOSIS — G8929 Other chronic pain: Secondary | ICD-10-CM | POA: Diagnosis not present

## 2016-04-25 DIAGNOSIS — Z1239 Encounter for other screening for malignant neoplasm of breast: Secondary | ICD-10-CM

## 2016-04-25 DIAGNOSIS — R5383 Other fatigue: Secondary | ICD-10-CM | POA: Diagnosis not present

## 2016-04-25 DIAGNOSIS — M545 Low back pain, unspecified: Secondary | ICD-10-CM

## 2016-04-25 LAB — COMPREHENSIVE METABOLIC PANEL
ALBUMIN: 4.1 g/dL (ref 3.5–5.2)
ALT: 15 U/L (ref 0–35)
AST: 18 U/L (ref 0–37)
Alkaline Phosphatase: 80 U/L (ref 39–117)
BUN: 13 mg/dL (ref 6–23)
CHLORIDE: 106 meq/L (ref 96–112)
CO2: 29 meq/L (ref 19–32)
Calcium: 9.5 mg/dL (ref 8.4–10.5)
Creatinine, Ser: 0.68 mg/dL (ref 0.40–1.20)
GFR: 90.95 mL/min (ref >60.00)
Glucose, Bld: 99 mg/dL (ref 70–99)
POTASSIUM: 4.2 meq/L (ref 3.5–5.1)
SODIUM: 143 meq/L (ref 135–145)
Total Bilirubin: 0.4 mg/dL (ref 0.2–1.2)
Total Protein: 7.1 g/dL (ref 6.0–8.3)

## 2016-04-25 LAB — CBC WITH DIFFERENTIAL/PLATELET
BASOS PCT: 0.4 % (ref 0.0–3.0)
Basophils Absolute: 0 10*3/uL (ref 0.0–0.1)
EOS ABS: 0.2 10*3/uL (ref 0.0–0.7)
EOS PCT: 2.5 % (ref 0.0–5.0)
HCT: 42.8 % (ref 36.0–46.0)
HEMOGLOBIN: 14.2 g/dL (ref 12.0–15.0)
LYMPHS ABS: 2.8 10*3/uL (ref 0.7–4.0)
Lymphocytes Relative: 29.8 % (ref 12.0–46.0)
MCHC: 33.1 g/dL (ref 30.0–36.0)
MCV: 87.6 fl (ref 78.0–100.0)
MONO ABS: 0.8 10*3/uL (ref 0.1–1.0)
Monocytes Relative: 8.1 % (ref 3.0–12.0)
NEUTROS ABS: 5.5 10*3/uL (ref 1.4–7.7)
NEUTROS PCT: 59.2 % (ref 43.0–77.0)
Platelets: 195 10*3/uL (ref 150.0–400.0)
RBC: 4.89 Mil/uL (ref 3.87–5.11)
RDW: 14.1 % (ref 11.5–15.5)
WBC: 9.3 10*3/uL (ref 4.0–10.5)

## 2016-04-25 LAB — B12 AND FOLATE PANEL
Folate: 13.9 ng/mL (ref >5.9)
VITAMIN B 12: 496 pg/mL (ref 211–911)

## 2016-04-25 LAB — TSH: TSH: 2.88 u[IU]/mL (ref 0.35–4.50)

## 2016-04-25 LAB — VITAMIN D 25 HYDROXY (VIT D DEFICIENCY, FRACTURES): VITD: 27.47 ng/mL — AB (ref 30.00–100.00)

## 2016-04-25 MED ORDER — TRAZODONE HCL 50 MG PO TABS: 50.0000 mg | ORAL_TABLET | Freq: Every day | ORAL | 0 refills | Status: DC

## 2016-04-25 NOTE — Patient Instructions (Signed)
Trial trazodone  No naps  Exercise program  Labs today  Follow up 2-4 weeks  Monitor BP; goal < 140/90

## 2016-04-25 NOTE — Assessment & Plan Note (Signed)
Reassured by benign exam. Will start PT. If no improvement, will do imaging which patient agreed.

## 2016-04-25 NOTE — Assessment & Plan Note (Addendum)
Unknown etiology at this time. Not orthostatic ( see flow sheet). Suspect multiple causes. Pending referral for sleep studies. Patient does not have a good sleep regimen is napping quite a bit during the day. Education provided on sleep hygiene. Trial of trazodone. Follow-up in 2-4 weeks. Pending lab work to evaluate further for underlying etiologies. Discussed with patient may repeat colonoscopy as well one year earlier if fatigue does not resolve.

## 2016-04-25 NOTE — Progress Notes (Signed)
Subjective:    Patient ID: Morgan Hurst, female    DOB: Jul 04, 1945, 71 y.o.   MRN: [id number]  CC: Morgan Hurst is a 71 y.o. female who presents today for an acute visit.    HPI: CC: fatigue x 2 months, last couple of weeks worsening.   usually feels pretty good after b12 injection. 'feels drained' and states her legs feel weak when doing yard work, mowing the grass. Endorses lightheadedness. No HA, falls, syncope, chest pain  Doesn't sleep well for past 10 years. Trouble staying asleep. Naps during the day. Wakes herself up snoring. Daughter lives with her and hears her 'stop breathing.'  Candis Musa if since starting pravachol and has stopped it 3 days ago and cannot tell difference.   Recent labs, vitamin D slightly low; has been on D3.   Still complains of low back pain for several months. Referral to PT has expired. Takes tramadol PRN. aggrevated with yard work and walking cement floors. No falls. No numbness and tingling. Dr [name] told her she may need hip replacement          Stress test 03/2015- normal HISTORY:  Past Medical History:  Diagnosis Date  . Arthritis    lower back, right hip  . Borderline diabetes    PCP STARTED PT ON METFORMIN IN 2016 DUE TO ELEVATED GLUCOSE   . Complication of anesthesia    FIGHTING WHEN WAKING UP FROM ANESTHESIA  . Endometriosis   . Heart murmur   . Orthodontics    top front 3 teeth caps and bridge   Past Surgical History:  Procedure Laterality Date  . ABDOMINAL HYSTERECTOMY  age 67  . ANKLE SURGERY    . CHOLECYSTECTOMY N/A 04/10/2015   Procedure: LAPAROSCOPIC CHOLECYSTECTOMY WITH INTRAOPERATIVE CHOLANGIOGRAM;  Surgeon: Robert Bellow, MD;  Location: [facility];  Service: General;  Laterality: N/A;  . ESOPHAGOGASTRODUODENOSCOPY N/A 05/12/2015   Procedure: ESOPHAGOGASTRODUODENOSCOPY (EGD);  Surgeon: Hulen Luster, MD;  Location: [place];  Service: Gastroenterology;  Laterality: N/A;  . VAGINAL DELIVERY  2   Family  History  Problem Relation Age of Onset  . Diabetes Mother   . Cancer Mother     colon and breast  . Cancer Father     Leukemia  . Cancer Brother     colon and lung  . Cholelithiasis Daughter   . Heart disease Paternal Grandfather   . Cancer Other 29    colon    Allergies: Patient has no known allergies. Current Outpatient Prescriptions on File Prior to Visit  Medication Sig Dispense Refill  . acetaminophen (TYLENOL) 325 MG tablet Take by mouth.    Marland Kitchen aspirin 81 MG tablet Take 81 mg by mouth daily.    Marland Kitchen CALCIUM PO Take by mouth.    . Cholecalciferol (VITAMIN D3) 2000 units capsule Take by mouth.    . cyanocobalamin (,VITAMIN B-12,) 1000 MCG/ML injection Inject 1,000 mcg into the muscle every 30 (thirty) days.    Marland Kitchen esomeprazole (NEXIUM) 40 MG capsule Take by mouth.    . hydrocortisone (ANUSOL-HC) 2.5 % rectal cream Place 1 application rectally 2 (two) times daily. 30 g 0  . metFORMIN (GLUCOPHAGE) 500 MG tablet Take 500 mg by mouth daily.    . ondansetron (ZOFRAN ODT) 4 MG disintegrating tablet Take 1 tablet (4 mg total) by mouth every 8 (eight) hours as needed for nausea or vomiting. 20 tablet 0  . OVER THE COUNTER MEDICATION Take 1 tablet by mouth as  needed. TART CHERRY EXTRACT    . pravastatin (PRAVACHOL) 40 MG tablet Take 1 tablet (40 mg total) by mouth daily. 90 tablet 2  . rOPINIRole (REQUIP) 0.25 MG tablet Take 1 tablet (0.25 mg total) by mouth at bedtime as needed. 90 tablet 0  . traMADol (ULTRAM) 50 MG tablet TAKE 1 TABLET BY MOUTH DAILY AS NEEDED 30 tablet 1  . TURMERIC PO Take 1 tablet by mouth daily.     No current facility-administered medications on file prior to visit.     Social History  Substance Use Topics  . Smoking status: Never Smoker  . Smokeless tobacco: Never Used  . Alcohol use No    Review of Systems  Constitutional: Positive for fatigue. Negative for chills and fever.  Respiratory: Positive for shortness of breath. Negative for cough and stridor.     Cardiovascular: Negative for chest pain and palpitations.  Gastrointestinal: Negative for nausea and vomiting.  Musculoskeletal: Positive for back pain.  Neurological: Positive for dizziness. Negative for syncope, weakness and numbness.      Objective:    BP 138/80   Pulse 76   Temp 98.1 F (36.7 C) (Oral)   Ht 5' 1.5" (1.562 m)   Wt 217 lb 3.2 oz (98.5 kg)   SpO2 96%   BMI 40.37 kg/m   BP Readings from Last 3 Encounters:  04/25/16 138/80  03/08/16 104/78  02/11/16 132/76    b   Physical Exam  Constitutional: She appears well-developed and well-nourished.  Eyes: Conjunctivae are normal.  Neck: No thyroid mass and no thyromegaly present.  Cardiovascular: Normal rate, regular rhythm, normal heart sounds and normal pulses.   Pulmonary/Chest: Effort normal and breath sounds normal. She has no wheezes. She has no rhonchi. She has no rales.  Musculoskeletal:       Right hip: She exhibits normal range of motion, normal strength, no tenderness, no bony tenderness and no swelling.       Left hip: She exhibits normal range of motion, normal strength, no tenderness, no bony tenderness and no swelling.       Lumbar back: She exhibits normal range of motion, no tenderness, no bony tenderness, no swelling, no edema, no pain and no spasm.  Full range of motion with flexion, tension, lateral side bends. No bony tenderness. No pain, numbness, tingling elicited with single leg raise bilaterally.  Right and left Hip: No limp or waddling gait. Full ROM with flexion and hip rotation in flexion. No pain of lateral hip with  (flexion-abduction-external rotation) test. No pain with deep palpation of greater trochanter.    Lymphadenopathy:       Head (right side): No submental, no submandibular, no tonsillar, no preauricular, no posterior auricular and no occipital adenopathy present.       Head (left side): No submental, no submandibular, no tonsillar, no preauricular, no posterior auricular and no  occipital adenopathy present.    She has no cervical adenopathy.  Neurological: She is alert. She has normal strength. No sensory deficit.  Reflex Scores:      Patellar reflexes are 2+ on the right side and 2+ on the left side. Sensation and strength intact bilateral lower extremities.  Skin: Skin is warm and dry.  Psychiatric: She has a normal mood and affect. Her speech is normal and behavior is normal. Thought content normal.  Vitals reviewed.      Assessment & Plan:   Problem List Items Addressed This Visit      Other  Fatigue - Primary    Unknown etiology at this time. Not orthostatic ( see flow sheet). Suspect multiple causes. Pending referral for sleep studies. Patient does not have a good sleep regimen is napping quite a bit during the day. Education provided on sleep hygiene. Trial of trazodone. Follow-up in 2-4 weeks. Pending lab work to evaluate further for underlying etiologies. Discussed with patient may repeat colonoscopy as well one year earlier if fatigue does not resolve.      Relevant Medications   traZODone (DESYREL) 50 MG tablet   Other Relevant Orders   Ambulatory referral to Sleep Studies   B12 and Folate Panel   CBC with Differential/Platelet   Comprehensive metabolic panel   VITAMIN D 25 Hydroxy (Vit-D Deficiency, Fractures)   TSH   Chronic midline low back pain without sciatica    Reassured by benign exam. Will start PT. If no improvement, will do imaging which patient agreed.       Relevant Orders   Ambulatory referral to Physical Therapy    Other Visit Diagnoses    Screening for breast cancer       Relevant Orders   MM DIGITAL SCREENING BILATERAL     Information given on mammogram and patient will schedule.     I have discontinued Ms. Morioka's gabapentin. I am also having her start on traZODone. Additionally, I am having her maintain her aspirin, OVER THE COUNTER MEDICATION, TURMERIC PO, cyanocobalamin, acetaminophen, Vitamin D3, CALCIUM  PO, metFORMIN, esomeprazole, rOPINIRole, hydrocortisone, ondansetron, pravastatin, and traMADol.   Meds ordered this encounter  Medications  . traZODone (DESYREL) 50 MG tablet    Sig: Take 1 tablet (50 mg total) by mouth at bedtime.    Dispense:  90 tablet    Refill:  0    Order Specific Question:   Supervising Provider    Answer:   Crecencio Mc [[id number]]    Return precautions given.   Risks, benefits, and alternatives of the medications and treatment plan prescribed today were discussed, and patient expressed understanding.   Education regarding symptom management and diagnosis given to patient on AVS.  Continue to follow with Mable Paris, FNP for routine health maintenance.   [name] and I agreed with plan.   Mable Paris, FNP

## 2016-04-25 NOTE — Progress Notes (Signed)
Pre visit review using our clinic review tool, if applicable. No additional management support is needed unless otherwise documented below in the visit note. 

## 2016-05-12 ENCOUNTER — Ambulatory Visit: Payer: Medicare Other | Admitting: Family

## 2016-05-17 ENCOUNTER — Ambulatory Visit (INDEPENDENT_AMBULATORY_CARE_PROVIDER_SITE_OTHER): Payer: Medicare Other | Admitting: *Deleted

## 2016-05-17 DIAGNOSIS — E538 Deficiency of other specified B group vitamins: Secondary | ICD-10-CM

## 2016-05-17 MED ORDER — CYANOCOBALAMIN 1000 MCG/ML IJ SOLN
1000.0000 ug | Freq: Once | INTRAMUSCULAR | Status: AC
  Administered 2016-05-17: 1000 ug via INTRAMUSCULAR

## 2016-05-17 NOTE — Progress Notes (Signed)
Patient presented for B 12 injection to right deltoid , patient voiced no concerns and showed no signs of discomfort during injection.

## 2016-05-24 ENCOUNTER — Encounter: Payer: Self-pay | Admitting: Family

## 2016-05-24 ENCOUNTER — Ambulatory Visit (INDEPENDENT_AMBULATORY_CARE_PROVIDER_SITE_OTHER): Payer: Medicare Other | Admitting: Family

## 2016-05-24 VITALS — BP 144/80 | HR 80 | Temp 97.4°F | Ht 61.25 in | Wt 216.6 lb

## 2016-05-24 DIAGNOSIS — R5383 Other fatigue: Secondary | ICD-10-CM

## 2016-05-24 DIAGNOSIS — M48062 Spinal stenosis, lumbar region with neurogenic claudication: Secondary | ICD-10-CM | POA: Diagnosis not present

## 2016-05-24 DIAGNOSIS — R030 Elevated blood-pressure reading, without diagnosis of hypertension: Secondary | ICD-10-CM

## 2016-05-24 MED ORDER — CELECOXIB 200 MG PO CAPS: 200.0000 mg | ORAL_CAPSULE | Freq: Two times a day (BID) | ORAL | 3 refills | Status: DC

## 2016-05-24 MED ORDER — TRAZODONE HCL 50 MG PO TABS: 50.0000 mg | ORAL_TABLET | Freq: Every day | ORAL | 1 refills | Status: DC

## 2016-05-24 MED ORDER — TRAMADOL HCL 50 MG PO TABS: 50.0000 mg | ORAL_TABLET | Freq: Every day | ORAL | 1 refills | Status: DC | PRN

## 2016-05-24 NOTE — Patient Instructions (Addendum)
Try pravachol ONCE day per week. And then gradually increase week by week and see if you experience any joint pain.   Trial of celebrex as needed for low back pain. Take with food  Trial increase trazodone by 2m -- for a total of 75 mg at bedtime. Let me know if too drowsy on this dose.  Hold trazodone when do sleep study.   f/u 3 months

## 2016-05-24 NOTE — Assessment & Plan Note (Signed)
Mildly elevated. Patient is actively try to lose weight. Encouraged low-salt diet. Will monitor at follow-up the patient will also monitor at home.

## 2016-05-24 NOTE — Progress Notes (Signed)
Pre visit review using our clinic review tool, if applicable. No additional management support is needed unless otherwise documented below in the visit note. 

## 2016-05-24 NOTE — Assessment & Plan Note (Signed)
Will trial celebrex as she has been on the in past. Tramadol refilled and patient using appropriately.

## 2016-05-24 NOTE — Progress Notes (Signed)
Subjective:    Patient ID: Morgan Hurst, female    DOB: 1945/05/21, 71 y.o.   MRN: [id number]  CC: Morgan Hurst is a 71 y.o. female who presents today for follow up.   HPI: HLD- off statin and leg pain and fatigue is better.   Last visit for fatigue- trial of trazodone. Sleeping better on medication however not staying asleep. no drowsiness.scheduled sleep study.   LBP- Unchanged. went to PT and glad she one time howevever cannot afford twice per week. Takes 1/2 tablet tramadol in the morning with relief. Skips some days. Has tried mobic without relief. Naprosyn works 'pretty good.' No h/o CKD, GIB.  Tried old bottle of celebrex which works 'works well.' Would like refill.   Has been checking BP at home and averages SBP 136. Trying to loose weight right now with diet. Denies exertional chest pain or pressure, numbness or tingling radiating to left arm or jaw, palpitations, dizziness, frequent headaches, changes in vision, or shortness of breath.          HISTORY:  Past Medical History:  Diagnosis Date  . Arthritis    lower back, right hip  . Borderline diabetes    PCP STARTED PT ON METFORMIN IN 2016 DUE TO ELEVATED GLUCOSE   . Complication of anesthesia    FIGHTING WHEN WAKING UP FROM ANESTHESIA  . Endometriosis   . Heart murmur   . Orthodontics    top front 3 teeth caps and bridge   Past Surgical History:  Procedure Laterality Date  . ABDOMINAL HYSTERECTOMY  age 33  . ANKLE SURGERY    . CHOLECYSTECTOMY N/A 04/10/2015   Procedure: LAPAROSCOPIC CHOLECYSTECTOMY WITH INTRAOPERATIVE CHOLANGIOGRAM;  Surgeon: Robert Bellow, MD;  Location: [facility];  Service: General;  Laterality: N/A;  . ESOPHAGOGASTRODUODENOSCOPY N/A 05/12/2015   Procedure: ESOPHAGOGASTRODUODENOSCOPY (EGD);  Surgeon: Hulen Luster, MD;  Location: [place];  Service: Gastroenterology;  Laterality: N/A;  . VAGINAL DELIVERY  2   Family History  Problem Relation Age of Onset  . Diabetes  Mother   . Cancer Mother     colon and breast  . Cancer Father     Leukemia  . Cancer Brother     colon and lung  . Cholelithiasis Daughter   . Heart disease Paternal Grandfather   . Cancer Other 29    colon    Allergies: Patient has no known allergies. Current Outpatient Prescriptions on File Prior to Visit  Medication Sig Dispense Refill  . acetaminophen (TYLENOL) 325 MG tablet Take by mouth.    Marland Kitchen aspirin 81 MG tablet Take 81 mg by mouth daily.    Marland Kitchen CALCIUM PO Take by mouth.    . Cholecalciferol (VITAMIN D3) 2000 units capsule Take by mouth.    . cyanocobalamin (,VITAMIN B-12,) 1000 MCG/ML injection Inject 1,000 mcg into the muscle every 30 (thirty) days.    Marland Kitchen esomeprazole (NEXIUM) 40 MG capsule Take by mouth.    . hydrocortisone (ANUSOL-HC) 2.5 % rectal cream Place 1 application rectally 2 (two) times daily. 30 g 0  . metFORMIN (GLUCOPHAGE) 500 MG tablet Take 500 mg by mouth daily.    . ondansetron (ZOFRAN ODT) 4 MG disintegrating tablet Take 1 tablet (4 mg total) by mouth every 8 (eight) hours as needed for nausea or vomiting. 20 tablet 0  . OVER THE COUNTER MEDICATION Take 1 tablet by mouth as needed. TART CHERRY EXTRACT    . pravastatin (PRAVACHOL) 40 MG tablet Take  1 tablet (40 mg total) by mouth daily. 90 tablet 2  . rOPINIRole (REQUIP) 0.25 MG tablet Take 1 tablet (0.25 mg total) by mouth at bedtime as needed. 90 tablet 0  . TURMERIC PO Take 1 tablet by mouth daily.     No current facility-administered medications on file prior to visit.     Social History  Substance Use Topics  . Smoking status: Never Smoker  . Smokeless tobacco: Never Used  . Alcohol use No    Review of Systems  Constitutional: Positive for fatigue. Negative for chills and fever.  Respiratory: Negative for cough.   Cardiovascular: Negative for chest pain and palpitations.  Gastrointestinal: Negative for nausea and vomiting.  Musculoskeletal: Positive for back pain.  Psychiatric/Behavioral:  Positive for sleep disturbance.      Objective:    BP (!) 144/80   Pulse 80   Temp 97.4 F (36.3 C) (Oral)   Ht 5' 1.25" (1.556 m)   Wt 216 lb 9.6 oz (98.2 kg)   SpO2 97%   BMI 40.59 kg/m  BP Readings from Last 3 Encounters:  05/24/16 (!) 144/80  04/25/16 138/80  03/08/16 104/78   Wt Readings from Last 3 Encounters:  05/24/16 216 lb 9.6 oz (98.2 kg)  04/25/16 217 lb 3.2 oz (98.5 kg)  03/08/16 217 lb (98.4 kg)    Physical Exam  Constitutional: She appears well-developed and well-nourished.  Eyes: Conjunctivae are normal.  Cardiovascular: Normal rate, regular rhythm, normal heart sounds and normal pulses.   Pulmonary/Chest: Effort normal and breath sounds normal. She has no wheezes. She has no rhonchi. She has no rales.  Neurological: She is alert.  Skin: Skin is warm and dry.  Psychiatric: She has a normal mood and affect. Her speech is normal and behavior is normal. Thought content normal.  Vitals reviewed.      Assessment & Plan:   Problem List Items Addressed This Visit      Other   Fatigue    Improved. Suspect poor sleeping contributory. Agreed to increase trazodone however she did not want to 100 mg so she will split tab and take 75 mg at bedtime. Awaiting sleep study. Declines earlier colonoscopy at this point. Will continue to follow.       Relevant Medications   traZODone (DESYREL) 50 MG tablet   Spinal stenosis, lumbar region, with neurogenic claudication - Primary    Will trial celebrex as she has been on the in past. Tramadol refilled and patient using appropriately.       Relevant Medications   traMADol (ULTRAM) 50 MG tablet   celecoxib (CELEBREX) 200 MG capsule   Elevated blood pressure reading    Mildly elevated. Patient is actively try to lose weight. Encouraged low-salt diet. Will monitor at follow-up the patient will also monitor at home.          I have changed Morgan Hurst's traMADol. I am also having her start on celecoxib.  Additionally, I am having her maintain her aspirin, OVER THE COUNTER MEDICATION, TURMERIC PO, cyanocobalamin, acetaminophen, Vitamin D3, CALCIUM PO, metFORMIN, esomeprazole, rOPINIRole, hydrocortisone, ondansetron, pravastatin, and traZODone.   Meds ordered this encounter  Medications  . traMADol (ULTRAM) 50 MG tablet    Sig: Take 1 tablet (50 mg total) by mouth daily as needed.    Dispense:  30 tablet    Refill:  1    Order Specific Question:   Supervising Provider    Answer:   Deborra Medina L [[id number]]  .  celecoxib (CELEBREX) 200 MG capsule    Sig: Take 1 capsule (200 mg total) by mouth 2 (two) times daily.    Dispense:  60 capsule    Refill:  3    Order Specific Question:   Supervising Provider    Answer:   Deborra Medina L [[id number]]  . traZODone (DESYREL) 50 MG tablet    Sig: Take 1 tablet (50 mg total) by mouth at bedtime.    Dispense:  90 tablet    Refill:  1    Order Specific Question:   Supervising Provider    Answer:   Crecencio Mc [[id number]]    Return precautions given.   Risks, benefits, and alternatives of the medications and treatment plan prescribed today were discussed, and patient expressed understanding.   Education regarding symptom management and diagnosis given to patient on AVS.  Continue to follow with Burnard Hawthorne, FNP for routine health maintenance.   [name] and I agreed with plan.   Mable Paris, FNP

## 2016-05-24 NOTE — Assessment & Plan Note (Addendum)
Improved. Suspect poor sleeping contributory. Agreed to increase trazodone however she did not want to 100 mg so she will split tab and take 75 mg at bedtime. Awaiting sleep study. Declines earlier colonoscopy at this point. Will continue to follow.

## 2016-05-30 ENCOUNTER — Encounter: Payer: Self-pay | Admitting: Family

## 2016-06-09 ENCOUNTER — Telehealth: Payer: Self-pay | Admitting: Family

## 2016-06-09 NOTE — Telephone Encounter (Signed)
Please call patient and notify her  sleep test is negative for sleep apnea.

## 2016-06-10 NOTE — Telephone Encounter (Signed)
Left message for patient to return call to office. 

## 2016-06-14 NOTE — Telephone Encounter (Signed)
Left message for patient to return call back.  

## 2016-06-15 NOTE — Telephone Encounter (Signed)
Mail letter

## 2016-06-15 NOTE — Telephone Encounter (Signed)
Letter has been mailed.

## 2016-06-17 NOTE — Telephone Encounter (Signed)
Patient was informed of results.  Patient understood and no questions, comments, or concerns at this time.

## 2016-06-22 ENCOUNTER — Ambulatory Visit: Payer: Medicare Other

## 2016-06-28 ENCOUNTER — Ambulatory Visit (INDEPENDENT_AMBULATORY_CARE_PROVIDER_SITE_OTHER): Payer: Medicare Other | Admitting: *Deleted

## 2016-06-28 DIAGNOSIS — E538 Deficiency of other specified B group vitamins: Secondary | ICD-10-CM

## 2016-06-28 MED ORDER — CYANOCOBALAMIN 1000 MCG/ML IJ SOLN
1000.0000 ug | Freq: Once | INTRAMUSCULAR | Status: AC
  Administered 2016-06-28: 1000 ug via INTRAMUSCULAR

## 2016-06-28 NOTE — Progress Notes (Addendum)
Patient presented for B 12 injection to left deltoid, patient voiced no concerns nor showed any signs of distress during injection.  Agree with plan. margaret

## 2016-08-03 ENCOUNTER — Ambulatory Visit (INDEPENDENT_AMBULATORY_CARE_PROVIDER_SITE_OTHER): Payer: Medicare Other | Admitting: *Deleted

## 2016-08-03 DIAGNOSIS — E538 Deficiency of other specified B group vitamins: Secondary | ICD-10-CM

## 2016-08-03 MED ORDER — CYANOCOBALAMIN 1000 MCG/ML IJ SOLN
1000.0000 ug | Freq: Once | INTRAMUSCULAR | Status: AC
  Administered 2016-08-03: 1000 ug via INTRAMUSCULAR

## 2016-08-03 NOTE — Progress Notes (Addendum)
Patient presented for B 12 injection to right deltoid, patient voiced no concerns nor showed any signs of distress during injection.  Noted, margaret, NP

## 2016-08-04 ENCOUNTER — Other Ambulatory Visit: Payer: Self-pay | Admitting: Family

## 2016-08-04 DIAGNOSIS — M48062 Spinal stenosis, lumbar region with neurogenic claudication: Secondary | ICD-10-CM

## 2016-08-09 NOTE — Telephone Encounter (Signed)
Please advise for refill, thanks

## 2016-08-10 NOTE — Telephone Encounter (Signed)
Please vise, thanks

## 2016-08-11 ENCOUNTER — Other Ambulatory Visit: Payer: Self-pay

## 2016-08-11 ENCOUNTER — Ambulatory Visit (INDEPENDENT_AMBULATORY_CARE_PROVIDER_SITE_OTHER): Payer: Medicare Other

## 2016-08-11 VITALS — BP 130/70 | HR 85 | Temp 98.3°F | Resp 14 | Ht 61.0 in | Wt 217.1 lb

## 2016-08-11 DIAGNOSIS — Z0000 Encounter for general adult medical examination without abnormal findings: Secondary | ICD-10-CM | POA: Diagnosis not present

## 2016-08-11 DIAGNOSIS — Z760 Encounter for issue of repeat prescription: Secondary | ICD-10-CM

## 2016-08-11 DIAGNOSIS — Z1331 Encounter for screening for depression: Secondary | ICD-10-CM

## 2016-08-11 DIAGNOSIS — Z1389 Encounter for screening for other disorder: Secondary | ICD-10-CM

## 2016-08-11 MED ORDER — ONDANSETRON 4 MG PO TBDP: 4.0000 mg | ORAL_TABLET | Freq: Three times a day (TID) | ORAL | 0 refills | Status: DC | PRN

## 2016-08-11 NOTE — Progress Notes (Signed)
Subjective:   Morgan Hurst is a 71 y.o. female who presents for Medicare Annual (Subsequent) preventive examination.  Review of Systems:  No ROS.  Medicare Wellness Visit. Additional risk factors are reflected in the social history.  Cardiac Risk Factors include: advanced age (>87mn, >>21women);obesity (BMI >30kg/m2)     Objective:     Vitals: BP 130/70 (BP Location: Left Arm, Patient Position: Sitting, Cuff Size: Normal)   Pulse 85   Temp 98.3 F (36.8 C) (Oral)   Resp 14   Ht 5' 1"  (1.549 m)   Wt 217 lb 1.9 oz (98.5 kg)   SpO2 97%   BMI 41.02 kg/m   Body mass index is 41.02 kg/m.   Tobacco History  Smoking Status  . Never Smoker  Smokeless Tobacco  . Never Used     Counseling given: Not Answered   Past Medical History:  Diagnosis Date  . Arthritis    lower back, right hip  . Borderline diabetes    PCP STARTED PT ON METFORMIN IN 2016 DUE TO ELEVATED GLUCOSE   . Complication of anesthesia    FIGHTING WHEN WAKING UP FROM ANESTHESIA  . Endometriosis   . Heart murmur   . Orthodontics    top front 3 teeth caps and bridge   Past Surgical History:  Procedure Laterality Date  . ABDOMINAL HYSTERECTOMY  age 71 . ANKLE SURGERY    . CHOLECYSTECTOMY N/A 04/10/2015   Procedure: LAPAROSCOPIC CHOLECYSTECTOMY WITH INTRAOPERATIVE CHOLANGIOGRAM;  Surgeon: JRobert Bellow MD;  Location: [facility];  Service: General;  Laterality: N/A;  . ESOPHAGOGASTRODUODENOSCOPY N/A 05/12/2015   Procedure: ESOPHAGOGASTRODUODENOSCOPY (EGD);  Surgeon: PHulen Luster MD;  Location[place] [facility]  Service: Gastroenterology;  Laterality: N/A;  . VAGINAL DELIVERY  2   Family History  Problem Relation Age of Onset  . Diabetes Mother   . Cancer Mother        colon and breast  . Cancer Father        Leukemia  . Cancer Brother        colon and lung  . Cholelithiasis Daughter   . Heart disease Paternal Grandfather   . Cancer Other 29       colon   History  Sexual Activity  .  Sexual activity: No    Outpatient Encounter Prescriptions as of 08/11/2016  Medication Sig  . acetaminophen (TYLENOL) 325 MG tablet Take by mouth.  .Marland Kitchenaspirin 81 MG tablet Take 81 mg by mouth daily.  .Marland KitchenCALCIUM PO Take by mouth.  . celecoxib (CELEBREX) 200 MG capsule Take 1 capsule (200 mg total) by mouth 2 (two) times daily.  . Cholecalciferol (VITAMIN D3) 2000 units capsule Take by mouth.  . cyanocobalamin (,VITAMIN B-12,) 1000 MCG/ML injection Inject 1,000 mcg into the muscle every 30 (thirty) days.  .Marland Kitchenesomeprazole (NEXIUM) 40 MG capsule Take by mouth.  . hydrocortisone (ANUSOL-HC) 2.5 % rectal cream Place 1 application rectally 2 (two) times daily.  . metFORMIN (GLUCOPHAGE) 500 MG tablet Take 500 mg by mouth daily.  . ondansetron (ZOFRAN ODT) 4 MG disintegrating tablet Take 1 tablet (4 mg total) by mouth every 8 (eight) hours as needed for nausea or vomiting.  .Marland KitchenOVER THE COUNTER MEDICATION Take 1 tablet by mouth as needed. TART CHERRY EXTRACT  . pravastatin (PRAVACHOL) 40 MG tablet Take 1 tablet (40 mg total) by mouth daily.  .Marland KitchenrOPINIRole (REQUIP) 0.25 MG tablet Take 1 tablet (0.25 mg total) by mouth at  bedtime as needed.  . traMADol (ULTRAM) 50 MG tablet TAKE 1 TABLET BY MOUTH DAILY AS NEEDED  . traZODone (DESYREL) 50 MG tablet Take 1 tablet (50 mg total) by mouth at bedtime.  . TURMERIC PO Take 1 tablet by mouth daily.   No facility-administered encounter medications on file as of 08/11/2016.     Activities of Daily Living In your present state of health, do you have any difficulty performing the following activities: 08/11/2016 08/12/2015  Hearing? N N  Vision? N N  Difficulty concentrating or making decisions? N Y  Walking or climbing stairs? N N  Dressing or bathing? N N  Doing errands, shopping? N N  Preparing Food and eating ? N N  Using the Toilet? N N  In the past six months, have you accidently leaked urine? N N  Do you have problems with loss of bowel control? N N    Managing your Medications? N N  Managing your Finances? N N  Housekeeping or managing your Housekeeping? N N  Some recent data might be hidden    Patient Care Team: Burnard Hawthorne, FNP as PCP - General (Family Medicine) Jackolyn Confer, MD (Internal Medicine) Bary Castilla Forest Gleason, MD (General Surgery)    Assessment:    This is a routine wellness examination for Morgan Hurst. The goal of the wellness visit is to assist the patient how to close the gaps in care and create a preventative care plan for the patient.   The roster of all physicians providing medical care to patient is listed in the Snapshot section of the chart.  Taking calcium VIT D as appropriate/Osteoporosis risk reviewed.    Safety issues reviewed; Smoke and carbon monoxide detectors in the home. No firearms in the home.  Wears seatbelts when driving or riding with others. Patient does wear sunscreen or protective clothing when in direct sunlight. No violence in the home.  Depression- PHQ 2 &9 complete.  No signs/symptoms or verbal communication regarding little pleasure in doing things, feeling down, depressed or hopeless. No changes in sleeping, energy, eating, concentrating.  No thoughts of self harm or harm towards others.  Time spent on this topic is 8 minutes.   Patient is alert, normal appearance, oriented to person/place/and time.  Correctly identified the president of the [place], recall of 3/3 words, and performing simple calculations. Displays appropriate judgement and can read correct time from watch face.   No new identified risk were noted.  No failures at ADL's or IADL's.    BMI- discussed the importance of a healthy diet, water intake and the benefits of aerobic exercise. Educational material provided.   24 hour diet recall: Breakfast: none Lunch: half of deli sandwich Dinner: boiled potatoes, cabbage, grilled chicken  Snack: 3 cookies Daily fluid intake: 1 cups of caffeine, 6-8 cups of  water  Dental- every 12 months. Dr. Cain Saupe.  E66.01 Morbid (severe) Obesity- followed by PCP.  Sleep patterns- Sleeps 4 hours at night.  Taking half of the dose prescribed of trazodone at bedtime. Naps as needed.  Mammogram previously ordered; educational material provided. She plans to schedule.  Health maintenance gaps- closed.  Patient Concerns: Intermittent nausea after meals.  Onset after cholecystectomy 03/2015. Zofran refill requested.  Exercise Activities and Dietary recommendations Current Exercise Habits: The patient does not participate in regular exercise at present  Goals    . Healthy Lifestyle          Continue making healthy meal choices.  Low carb  foods. Stay hydrated and drink plenty of water. Stay active.  Stretch.  Daily chair exercises when unable to walk.       Fall Risk Fall Risk  08/11/2016 05/24/2016 12/31/2015 08/24/2015 08/12/2015  Falls in the past year? Yes No No No No   Depression Screen PHQ 2/9 Scores 08/11/2016 05/24/2016 12/31/2015 08/24/2015  PHQ - 2 Score 0 0 0 0     Cognitive Function MMSE - Mini Mental State Exam 08/11/2016 08/12/2015  Orientation to time 5 5  Orientation to Place 5 5  Registration 3 3  Attention/ Calculation 5 5  Recall 3 3  Language- name 2 objects 2 2  Language- repeat 1 1  Language- follow 3 step command 3 3  Language- read & follow direction 1 1  Write a sentence 1 1  Copy design 1 1  Total score 30 30        Immunization History  Administered Date(s) Administered  . Influenza Split 12/26/2011  . Influenza, High Dose Seasonal PF 11/25/2015  . Influenza,inj,Quad PF,36+ Mos 12/25/2012, 03/18/2014, 10/07/2014  . Pneumococcal Conjugate-13 03/18/2014  . Pneumococcal Polysaccharide-23 12/26/2011  . Tdap 12/25/2009  . Zoster 12/26/2010   Screening Tests Health Maintenance  Topic Date Due  . FOOT EXAM  06/05/1955  . MAMMOGRAM  06/23/2015  . OPHTHALMOLOGY EXAM  08/18/2015  . INFLUENZA VACCINE  08/17/2016   . HEMOGLOBIN A1C  08/21/2016  . URINE MICROALBUMIN  02/21/2017  . COLONOSCOPY  05/02/2017  . TETANUS/TDAP  12/26/2019  . DEXA SCAN  Completed  . Hepatitis C Screening  Completed  . PNA vac Low Risk Adult  Completed      Plan:    End of life planning; Advance aging; Advanced directives discussed. Copy of current HCPOA/Living Will requested.    I have personally reviewed and noted the following in the patient's chart:   . Medical and social history . Use of alcohol, tobacco or illicit drugs  . Current medications and supplements . Functional ability and status . Nutritional status . Physical activity . Advanced directives . List of other physicians . Hospitalizations, surgeries, and ER visits in previous 12 months . Vitals . Screenings to include cognitive, depression, and falls . Referrals and appointments  In addition, I have reviewed and discussed with patient certain preventive protocols, quality metrics, and best practice recommendations. A written personalized care plan for preventive services as well as general preventive health recommendations were provided to patient.     Varney Biles, LPN  [id number]/[id number]   Agree with plan. Refilled zofran and call to patient.  Mable Paris, NP

## 2016-08-11 NOTE — Telephone Encounter (Signed)
I looked up patient on [place] Controlled Substances Reporting System and saw no activity that raised concern of inappropriate use.   Chronic low back pain well maintained on occasional tramadol and Celebrex. Continue current regimen  FYI tullo

## 2016-08-11 NOTE — Patient Instructions (Addendum)
Ms. Rutkowski , Thank you for taking time to come for your Medicare Wellness Visit. I appreciate your ongoing commitment to your health goals. Please review the following plan we discussed and let me know if I can assist you in the future.   Follow up with Dr. Mable Paris, FNP as needed.    Bring a copy of your [place] and/or Living Will to be scanned into chart.  Schedule mammogram.   Have a great day!  These are the goals we discussed: Goals    . Healthy Lifestyle          Continue making healthy meal choices.  Low carb foods. Stay hydrated and drink plenty of water. Stay active.  Stretch.  Daily chair exercises when unable to walk.        This is a list of the screening recommended for you and due dates:  Health Maintenance  Topic Date Due  . Complete foot exam   06/05/1955  . Mammogram  06/23/2015  . Eye exam for diabetics  08/18/2015  . Flu Shot  08/17/2016  . Hemoglobin A1C  08/21/2016  . Urine Protein Check  02/21/2017  . Colon Cancer Screening  05/02/2017  . Tetanus Vaccine  12/26/2019  . DEXA scan (bone density measurement)  Completed  .  Hepatitis C: One time screening is recommended by Center for Disease Control  (CDC) for  adults born from [birth year] through 1964.   Completed  . Pneumonia vaccines  Completed      Mammogram A mammogram is an X-ray of the breasts that is done to check for abnormal changes. This procedure can screen for and detect any changes that may suggest breast cancer. A mammogram can also identify other changes and variations in the breast, such as:  Inflammation of the breast tissue (mastitis).  An infected area that contains a collection of pus (abscess).  A fluid-filled sac (cyst).  Fibrocystic changes. This is when breast tissue becomes denser, which can make the tissue feel rope-like or uneven under the skin.  Tumors that are not cancerous (benign).  Tell a health care provider about:  Any allergies you  have.  If you have breast implants.  If you have had previous breast disease, biopsy, or surgery.  If you are breastfeeding.  Any possibility that you could be pregnant, if this applies.  If you are younger than age 75.  If you have a family history of breast cancer. What are the risks? Generally, this is a safe procedure. However, problems may occur, including:  Exposure to radiation. Radiation levels are very low with this test.  The results being misinterpreted.  The need for further tests.  The inability of the mammogram to detect certain cancers.  What happens before the procedure?  Schedule your test about 1-2 weeks after your menstrual period. This is usually when your breasts are the least tender.  If you have had a mammogram done at a different facility in the past, get the mammogram X-rays or have them sent to your current exam facility in order to compare them.  Wash your breasts and under your arms the day of the test.  Do not wear deodorants, perfumes, lotions, or powders anywhere on your body on the day of the test.  Remove any jewelry from your neck.  Wear clothes that you can change into and out of easily. What happens during the procedure?  You will undress from the waist up and put on a  gown.  You will stand in front of the X-ray machine.  Each breast will be placed between two plastic or glass plates. The plates will compress your breast for a few seconds. Try to stay as relaxed as possible during the procedure. This does not cause any harm to your breasts and any discomfort you feel will be very brief.  X-rays will be taken from different angles of each breast. The procedure may vary among health care providers and hospitals. What happens after the procedure?  The mammogram will be examined by a specialist (radiologist).  You may need to repeat certain parts of the test, depending on the quality of the images. This is commonly done if the  radiologist needs a better view of the breast tissue.  Ask when your test results will be ready. Make sure you get your test results.  You may resume your normal activities. This information is not intended to replace advice given to you by your health care provider. Make sure you discuss any questions you have with your health care provider. Document Released: 01/01/2000 Document Revised: 06/08/2015 Document Reviewed: 03/14/2014 Elsevier Interactive Patient Education  2017 [employer].

## 2016-08-16 ENCOUNTER — Telehealth: Payer: Self-pay | Admitting: Family

## 2016-08-16 DIAGNOSIS — R110 Nausea: Secondary | ICD-10-CM

## 2016-08-16 DIAGNOSIS — Z760 Encounter for issue of repeat prescription: Secondary | ICD-10-CM

## 2016-08-16 MED ORDER — ONDANSETRON 4 MG PO TBDP: 4.0000 mg | ORAL_TABLET | Freq: Three times a day (TID) | ORAL | 0 refills | Status: DC | PRN

## 2016-08-16 NOTE — Telephone Encounter (Signed)
Call pt  Sent in zofran after reading denisa's note regarding nausea If persistent, or she has other symptoms advise an OV

## 2016-08-16 NOTE — Telephone Encounter (Signed)
Patient has been made aware.

## 2016-08-24 ENCOUNTER — Ambulatory Visit: Payer: Medicare Other | Admitting: Family

## 2016-09-06 ENCOUNTER — Ambulatory Visit (INDEPENDENT_AMBULATORY_CARE_PROVIDER_SITE_OTHER): Payer: Medicare Other

## 2016-09-06 DIAGNOSIS — E538 Deficiency of other specified B group vitamins: Secondary | ICD-10-CM | POA: Diagnosis not present

## 2016-09-06 MED ORDER — CYANOCOBALAMIN 1000 MCG/ML IJ SOLN
1000.0000 ug | Freq: Once | INTRAMUSCULAR | Status: AC
  Administered 2016-09-06: 1000 ug via INTRAMUSCULAR

## 2016-09-06 NOTE — Progress Notes (Addendum)
Patient came in for b12 injection, received in left deltoid, tolerated well. Thanks Agree with plan, margaret arnett, np

## 2016-09-07 ENCOUNTER — Ambulatory Visit: Payer: Self-pay

## 2016-09-07 ENCOUNTER — Ambulatory Visit (INDEPENDENT_AMBULATORY_CARE_PROVIDER_SITE_OTHER): Payer: Medicare Other | Admitting: Family Medicine

## 2016-09-07 ENCOUNTER — Encounter: Payer: Self-pay | Admitting: Family Medicine

## 2016-09-07 VITALS — BP 118/88 | HR 74 | Wt 213.0 lb

## 2016-09-07 DIAGNOSIS — M25551 Pain in right hip: Secondary | ICD-10-CM

## 2016-09-07 DIAGNOSIS — M7061 Trochanteric bursitis, right hip: Secondary | ICD-10-CM | POA: Diagnosis not present

## 2016-09-07 NOTE — Progress Notes (Signed)
[facility] [address], [place] [address] Phone: [phone] Subjective:    I'm seeing this patient by the request  of:    CC: Right hip pain   [id number]  Morgan Hurst is a 71 y.o. female coming in with complaint of right hip pain. Patient describes the pain as a dull, throbbing aching pain on the lateral aspect of the hip. This is been going on for several weeks. Patient did have a similar presentation when reviewing her chart. Patient did see me and a half ago had a greater trochanteric bursitis that didn't respond very well to home exercises and injection. Patient states that the pain is unrelenting. Unable to workout secondary to the pain. Can wake her up at night. Mild associated back pain. Rates the severity of pain a 7 out of 10.     Past Medical History:  Diagnosis Date  . Arthritis    lower back, right hip  . Borderline diabetes    PCP STARTED PT ON METFORMIN IN 03-25-2014 DUE TO ELEVATED GLUCOSE   . Complication of anesthesia    FIGHTING WHEN WAKING UP FROM ANESTHESIA  . Endometriosis   . Heart murmur   . Orthodontics    top front 3 teeth caps and bridge   Past Surgical History:  Procedure Laterality Date  . ABDOMINAL HYSTERECTOMY  age 56  . ANKLE SURGERY    . CHOLECYSTECTOMY N/A 04/10/2015   Procedure: LAPAROSCOPIC CHOLECYSTECTOMY WITH INTRAOPERATIVE CHOLANGIOGRAM;  Surgeon: Robert Bellow, MD;  Location: [facility];  Service: General;  Laterality: N/A;  . ESOPHAGOGASTRODUODENOSCOPY N/A 05/12/2015   Procedure: ESOPHAGOGASTRODUODENOSCOPY (EGD);  Surgeon: Hulen Luster, MD;  Location: [place];  Service: Gastroenterology;  Laterality: N/A;  . VAGINAL DELIVERY  2   Social History   Social History  . Marital status: Widowed    Spouse name: N/A  . Number of children: N/A  . Years of education: N/A   Social History Main Topics  . Smoking status: Never Smoker  . Smokeless tobacco: Never Used  . Alcohol use No  .  Drug use: No  . Sexual activity: No   Other Topics Concern  . None   Social History Narrative   Lives in [place].       Husband passed away 2014-03-25 from PNA.      Work - retired from [employer] - regular; working on [employer]   Exercise - walks occasionally, limited by fatigue   No Known Allergies Family History  Problem Relation Age of Onset  . Diabetes Mother   . Cancer Mother        colon and breast  . Cancer Father        Leukemia  . Cancer Brother        colon and lung  . Cholelithiasis Daughter   . Heart disease Paternal Grandfather   . Cancer Other 29       colon     Past medical history, social, surgical and family history all reviewed in electronic medical record.  No pertanent information unless stated regarding to the chief complaint.   Review of Systems:Review of systems updated and as accurate as of 09/07/16  No headache, visual changes, nausea, vomiting, diarrhea, constipation, dizziness, abdominal pain, skin rash, fevers, chills, night sweats, weight loss, swollen lymph nodes, body aches, joint swelling,  chest pain, shortness of breath, mood changes. Positive muscle aches  Objective  Blood pressure 118/88, pulse 74, weight 213 lb (96.6 kg). Systems examined below as of 09/07/16   General: No apparent distress alert and oriented x3 mood and affect normal, dressed appropriately.  HEENT: Pupils equal, extraocular movements intact  Respiratory: Patient's speak in full sentences and does not appear short of breath  Cardiovascular: No lower extremity edema, non tender, no erythema  Skin: Warm dry intact with no signs of infection or rash on extremities or on axial skeleton.  Abdomen: Soft nontender  Neuro: Cranial nerves II through XII are intact, neurovascularly intact in all extremities with 2+ DTRs and 2+ pulses.  Lymph: No lymphadenopathy of posterior or anterior cervical chain or axillae bilaterally.  Gait Antalgic MSK:  Non tender  with full range of motion and good stability and symmetric strength and tone of shoulders, elbows, wrist, hip, knee and ankles bilaterally. Arthritic changes of multiple joints  Right hip shows the patient does have some mild limitation in external and internal range of motion. Patient does have some tightness of the hip flexor. Severely tender to palpation over the lateral greater trochanteric area. Mild pain over the right sacroiliac joint. Neurovascular intact distally. Negative straight leg test.   Procedure: Real-time Ultrasound Guided Injection of right greater trochanteric bursitis secondary to patient's body habitus Device: GE Logiq Q7 Ultrasound guided injection is preferred based studies that show increased duration, increased effect, greater accuracy, decreased procedural pain, increased response rate, and decreased cost with ultrasound guided versus blind injection.  Verbal informed consent obtained.  Time-out conducted.  Noted no overlying erythema, induration, or other signs of local infection.  Skin prepped in a sterile fashion.  Local anesthesia: Topical Ethyl chloride.  With sterile technique and under real time ultrasound guidance:  Greater trochanteric area was visualized and patient's bursa was noted. A 22-gauge 3 inch needle was inserted and 4 cc of 0.5% Marcaine and 1 cc of Kenalog 40 mg/dL was injected. Pictures taken Completed without difficulty  Pain immediately resolved suggesting accurate placement of the medication.  Advised to call if fevers/chills, erythema, induration, drainage, or persistent bleeding.  Images permanently stored and available for review in the ultrasound unit.  Impression: Technically successful ultrasound guided injection.    Impression and Recommendations:     This case required medical decision making of moderate complexity.      Note: This dictation was prepared with Dragon dictation along with smaller phrase technology. Any  transcriptional errors that result from this process are unintentional.

## 2016-09-07 NOTE — Assessment & Plan Note (Signed)
Patient was given an injection today and tolerated the procedure well. Discussed HEP, icing pennsaid and the importance of hip abductor strengthening. Differential includes lumbar radiculopathy. Does have a history of spinal stenosis. May need epidural in the future. Discussed icing regimen. Follow-up with me again in 4 weeks

## 2016-09-07 NOTE — Progress Notes (Signed)
[facility] [address], [place] [address] Phone: [phone] Subjective:    I'm seeing this patient by the request  of:    CC:   [id number]  KATALEAH BEJAR is a 71 y.o. female coming in for follow up for right hand and hip. Her hand is feeling better but she is having some pain in her hip. Her pain comes and goes. Mornings are worse as is late afternoon.   Onset- 2 weeks Location- right hip Duration- intermittent Character- dull, achy Aggravating factors- seated positions Reliving factors- stretching Therapies tried- stretching Severity-      Past Medical History:  Diagnosis Date  . Arthritis    lower back, right hip  . Borderline diabetes    PCP STARTED PT ON METFORMIN IN 03/19/14 DUE TO ELEVATED GLUCOSE   . Complication of anesthesia    FIGHTING WHEN WAKING UP FROM ANESTHESIA  . Endometriosis   . Heart murmur   . Orthodontics    top front 3 teeth caps and bridge   Past Surgical History:  Procedure Laterality Date  . ABDOMINAL HYSTERECTOMY  age 21  . ANKLE SURGERY    . CHOLECYSTECTOMY N/A 04/10/2015   Procedure: LAPAROSCOPIC CHOLECYSTECTOMY WITH INTRAOPERATIVE CHOLANGIOGRAM;  Surgeon: Robert Bellow, MD;  Location: [facility];  Service: General;  Laterality: N/A;  . ESOPHAGOGASTRODUODENOSCOPY N/A 05/12/2015   Procedure: ESOPHAGOGASTRODUODENOSCOPY (EGD);  Surgeon: Hulen Luster, MD;  Location: [facility];  Service: Gastroenterology;  Laterality: N/A;  . VAGINAL DELIVERY  2   Social History   Social History  . Marital status: Widowed    Spouse name: N/A  . Number of children: N/A  . Years of education: N/A   Social History Main Topics  . Smoking status: Never Smoker  . Smokeless tobacco: Never Used  . Alcohol use No  . Drug use: No  . Sexual activity: No   Other Topics Concern  . Not on file   Social History Narrative   Lives in [place].       Husband passed away 2014-03-19 from PNA.      Work - retired  from [employer] - regular; working on [employer]   Exercise - walks occasionally, limited by fatigue   No Known Allergies Family History  Problem Relation Age of Onset  . Diabetes Mother   . Cancer Mother        colon and breast  . Cancer Father        Leukemia  . Cancer Brother        colon and lung  . Cholelithiasis Daughter   . Heart disease Paternal Grandfather   . Cancer Other 29       colon     Past medical history, social, surgical and family history all reviewed in electronic medical record.  No pertanent information unless stated regarding to the chief complaint.   Review of Systems:Review of systems updated and as accurate as of 09/07/16  No headache, visual changes, nausea, vomiting, diarrhea, constipation, dizziness, abdominal pain, skin rash, fevers, chills, night sweats, weight loss, swollen lymph nodes, body aches, joint swelling, muscle aches, chest pain, shortness of breath, mood changes.   Objective  There were no vitals taken for this visit. Systems examined below as of 09/07/16   General: No apparent distress alert and oriented x3 mood and affect normal, dressed appropriately.  HEENT: Pupils equal, extraocular movements intact  Respiratory: Patient's  speak in full sentences and does not appear short of breath  Cardiovascular: No lower extremity edema, non tender, no erythema  Skin: Warm dry intact with no signs of infection or rash on extremities or on axial skeleton.  Abdomen: Soft nontender  Neuro: Cranial nerves II through XII are intact, neurovascularly intact in all extremities with 2+ DTRs and 2+ pulses.  Lymph: No lymphadenopathy of posterior or anterior cervical chain or axillae bilaterally.  Gait normal with good balance and coordination.  MSK:  Non tender with full range of motion and good stability and symmetric strength and tone of shoulders, elbows, wrist, hip, knee and ankles bilaterally.     Impression and Recommendations:       This case required medical decision making of moderate complexity.      Note: This dictation was prepared with Dragon dictation along with smaller phrase technology. Any transcriptional errors that result from this process are unintentional.

## 2016-09-12 ENCOUNTER — Ambulatory Visit: Payer: Medicare Other | Admitting: Family

## 2016-09-22 ENCOUNTER — Encounter: Payer: Self-pay | Admitting: Family

## 2016-09-22 ENCOUNTER — Telehealth: Payer: Self-pay | Admitting: Family

## 2016-09-22 NOTE — Telephone Encounter (Signed)
Patient has been scheduled with Webb Silversmith, NP at [facility] tomorrow at 4:15 pm.

## 2016-09-22 NOTE — Telephone Encounter (Signed)
Morgan Hurst,   I would be  Most comfortable if she could be seen.  Any visits here or at [facility]

## 2016-09-22 NOTE — Telephone Encounter (Signed)
Patient says 3 days ago she ate spaghetti and salad and smoked sausages. Starts between shoulder blade and sometimes radiates to front. She describes the pain as a gas pain or like there is air stuck. She said she can raise her arms and make herself burp to relieve the pressure but it only helps for a few minutes. Patient belching constantly. She states that it feels exactly how she felt when she had to have her gallbladder taken out. Patient has been belching to the point that it makes her nauseous. Patient is on nexium and most of the time she only takes 1 Q day. The last 3 days she has 3 q day. Yesterday she took a zofran to help with nausea. Patient denies any acute symptoms. Please advise.

## 2016-09-22 NOTE — Telephone Encounter (Signed)
Pt has had gas pain for three days. She states she no longer has her gallbladder. Please call pt at home, [phone].

## 2016-09-23 ENCOUNTER — Other Ambulatory Visit: Payer: Self-pay | Admitting: Internal Medicine

## 2016-09-23 ENCOUNTER — Ambulatory Visit (INDEPENDENT_AMBULATORY_CARE_PROVIDER_SITE_OTHER): Payer: Medicare Other | Admitting: Internal Medicine

## 2016-09-23 ENCOUNTER — Encounter: Payer: Self-pay | Admitting: Internal Medicine

## 2016-09-23 VITALS — BP 132/84 | HR 79 | Temp 97.9°F | Wt 212.0 lb

## 2016-09-23 DIAGNOSIS — R142 Eructation: Secondary | ICD-10-CM | POA: Diagnosis not present

## 2016-09-23 DIAGNOSIS — R0789 Other chest pain: Secondary | ICD-10-CM | POA: Diagnosis not present

## 2016-09-23 DIAGNOSIS — M546 Pain in thoracic spine: Secondary | ICD-10-CM

## 2016-09-23 DIAGNOSIS — R1012 Left upper quadrant pain: Secondary | ICD-10-CM | POA: Diagnosis not present

## 2016-09-23 DIAGNOSIS — R1013 Epigastric pain: Secondary | ICD-10-CM

## 2016-09-23 LAB — CBC
HCT: 43.4 % (ref 35.0–45.0)
HEMOGLOBIN: 14.4 g/dL (ref 11.7–15.5)
MCH: 28.2 pg (ref 27.0–33.0)
MCHC: 33.2 g/dL (ref 32.0–36.0)
MCV: 84.9 fL (ref 80.0–100.0)
MPV: 12.2 fL (ref 7.5–12.5)
Platelets: 213 10*3/uL (ref 140–400)
RBC: 5.11 10*6/uL — ABNORMAL HIGH (ref 3.80–5.10)
RDW: 13.1 % (ref 11.0–15.0)
WBC: 13.3 10*3/uL — ABNORMAL HIGH (ref 3.8–10.8)

## 2016-09-23 LAB — COMPREHENSIVE METABOLIC PANEL
AG Ratio: 1.3 (calc) (ref 1.0–2.5)
ALKALINE PHOSPHATASE (APISO): 84 U/L (ref 33–130)
ALT: 15 U/L (ref 6–29)
AST: 15 U/L (ref 10–35)
Albumin: 4.1 g/dL (ref 3.6–5.1)
BILIRUBIN TOTAL: 0.6 mg/dL (ref 0.2–1.2)
BUN: 14 mg/dL (ref 7–25)
CALCIUM: 9.7 mg/dL (ref 8.6–10.4)
CO2: 30 mmol/L (ref 20–32)
Chloride: 102 mmol/L (ref 98–110)
Creat: 0.61 mg/dL (ref 0.60–0.93)
GLUCOSE: 94 mg/dL (ref 65–99)
Globulin: 3.2 g/dL (calc) (ref 1.9–3.7)
Potassium: 3.9 mmol/L (ref 3.5–5.3)
Sodium: 141 mmol/L (ref 135–146)
Total Protein: 7.3 g/dL (ref 6.1–8.1)

## 2016-09-23 LAB — AMYLASE: AMYLASE: 33 U/L (ref 21–101)

## 2016-09-23 LAB — LIPASE: Lipase: 10 U/L (ref 7–60)

## 2016-09-23 MED ORDER — SUCRALFATE 1 G PO TABS: 1.0000 g | ORAL_TABLET | Freq: Four times a day (QID) | ORAL | 0 refills | Status: DC

## 2016-09-23 MED ORDER — SUCRALFATE 1 GM/10ML PO SUSP: 1.0000 g | Freq: Three times a day (TID) | ORAL | 0 refills | Status: DC

## 2016-09-23 MED ORDER — GI COCKTAIL ~~LOC~~
30.0000 mL | Freq: Once | ORAL | Status: AC
Start: 2016-09-23 — End: 2016-09-23
  Administered 2016-09-23: 30 mL via ORAL

## 2016-09-23 MED ORDER — DEXLANSOPRAZOLE 60 MG PO CPDR: 60.0000 mg | DELAYED_RELEASE_CAPSULE | Freq: Every day | ORAL | 0 refills | Status: DC

## 2016-09-23 NOTE — Telephone Encounter (Signed)
Noted thanks °

## 2016-09-23 NOTE — Patient Instructions (Signed)
Gastritis, Adult  Gastritis is swelling (inflammation) of the stomach. When you have this condition, you can have these problems (symptoms):  ? Pain in your stomach.  ? A burning feeling in your stomach.  ? Feeling sick to your stomach (nauseous).  ? Throwing up (vomiting).  ? Feeling too full after you eat.  It is important to get help for this condition. Without help, your stomach can bleed, and you can get sores (ulcers) in your stomach.  Follow these instructions at home:  ? Take over-the-counter and prescription medicines only as told by your doctor.  ? If you were prescribed an antibiotic medicine, take it as told by your doctor. Do not stop taking it even if you start to feel better.  ? Drink enough fluid to keep your pee (urine) clear or pale yellow.  ? Instead of eating big meals, eat small meals often.  Contact a health care provider if:  ? Your problems get worse.  ? Your problems go away and then come back.  Get help right away if:  ? You throw up blood or something that looks like coffee grounds.  ? You have black or dark red poop (stools).  ? You cannot keep fluids down.  ? Your stomach pain gets worse.  ? You have a fever.  ? You do not feel better after 1 week.  This information is not intended to replace advice given to you by your health care provider. Make sure you discuss any questions you have with your health care provider.  Document Released: 06/22/2007 Document Revised: 09/02/2015 Document Reviewed: 09/27/2014  Elsevier Interactive Patient Education ? 2018 Elsevier Inc.

## 2016-09-23 NOTE — Addendum Note (Signed)
Addended by: Lurlean Nanny on: 09/23/2016 05:07 PM   Modules accepted: Orders

## 2016-09-23 NOTE — Progress Notes (Signed)
Subjective:    Patient ID: Morgan Hurst, female    DOB: 09/08/1945, 71 y.o.   MRN: [id number]  HPI  Pt presents to the clinic today with c/o burping and gas pain. This started 4 days ago after eating spaghetti. She has pain the epigastric region that radiates into her left upper chest and back. The pain is worse with laying down and taking a deep breath. She denies cough or shortness of breath. She has some mild nausea but denies vomiting. Her bowels are moving normally. She has a history of reflux, taking Nexium daily, although when she started having these symptoms, she has been taking it 3 x day. She has had a cholecystectomy. She denies recent changes in diet. She started Celebrex about 6 weeks ago and does not take any other NSAID's OTC.  Review of Systems      Past Medical History:  Diagnosis Date  . Arthritis    lower back, right hip  . Borderline diabetes    PCP STARTED PT ON METFORMIN IN 2016 DUE TO ELEVATED GLUCOSE   . Complication of anesthesia    FIGHTING WHEN WAKING UP FROM ANESTHESIA  . Endometriosis   . Heart murmur   . Orthodontics    top front 3 teeth caps and bridge    Current Outpatient Prescriptions  Medication Sig Dispense Refill  . acetaminophen (TYLENOL) 325 MG tablet Take by mouth.    Marland Kitchen aspirin 81 MG tablet Take 81 mg by mouth daily.    Marland Kitchen CALCIUM PO Take by mouth.    . celecoxib (CELEBREX) 200 MG capsule Take 1 capsule (200 mg total) by mouth 2 (two) times daily. 60 capsule 3  . Cholecalciferol (VITAMIN D3) 2000 units capsule Take by mouth.    . cyanocobalamin (,VITAMIN B-12,) 1000 MCG/ML injection Inject 1,000 mcg into the muscle every 30 (thirty) days.    Marland Kitchen esomeprazole (NEXIUM) 40 MG capsule Take by mouth.    . hydrocortisone (ANUSOL-HC) 2.5 % rectal cream Place 1 application rectally 2 (two) times daily. 30 g 0  . metFORMIN (GLUCOPHAGE) 500 MG tablet Take 500 mg by mouth daily.    . ondansetron (ZOFRAN ODT) 4 MG disintegrating tablet Take 1  tablet (4 mg total) by mouth every 8 (eight) hours as needed for nausea or vomiting. 20 tablet 0  . OVER THE COUNTER MEDICATION Take 1 tablet by mouth as needed. TART CHERRY EXTRACT    . pravastatin (PRAVACHOL) 40 MG tablet Take 1 tablet (40 mg total) by mouth daily. 90 tablet 2  . rOPINIRole (REQUIP) 0.25 MG tablet Take 1 tablet (0.25 mg total) by mouth at bedtime as needed. 90 tablet 0  . traMADol (ULTRAM) 50 MG tablet TAKE 1 TABLET BY MOUTH DAILY AS NEEDED 30 tablet 1  . traZODone (DESYREL) 50 MG tablet Take 1 tablet (50 mg total) by mouth at bedtime. 90 tablet 1  . TURMERIC PO Take 1 tablet by mouth daily.     No current facility-administered medications for this visit.     No Known Allergies  Family History  Problem Relation Age of Onset  . Diabetes Mother   . Cancer Mother        colon and breast  . Cancer Father        Leukemia  . Cancer Brother        colon and lung  . Cholelithiasis Daughter   . Heart disease Paternal Grandfather   . Cancer Other 29  colon    Social History   Social History  . Marital status: Widowed    Spouse name: N/A  . Number of children: N/A  . Years of education: N/A   Occupational History  . Not on file.   Social History Main Topics  . Smoking status: Never Smoker  . Smokeless tobacco: Never Used  . Alcohol use No  . Drug use: No  . Sexual activity: No   Other Topics Concern  . Not on file   Social History Narrative   Lives in [place].       Husband passed away 04-09-2014 from PNA.      Work - retired from [employer] - regular; working on [employer]   Exercise - walks occasionally, limited by fatigue     Constitutional: Denies fever, malaise, fatigue, headache or abrupt weight changes.  HEENT: Denies eye pain, eye redness, ear pain, ringing in the ears, wax buildup, runny nose, nasal congestion, bloody nose, or sore throat. Respiratory: Denies difficulty breathing, shortness of breath, cough or sputum  production.   Cardiovascular: Pt reports chest pressure. Denies chest pain, chest tightness, palpitations or swelling in the hands or feet.  Gastrointestinal: Pt reports abdominal pain. Denies bloating, constipation, diarrhea or blood in the stool.  GU: Denies urgency, frequency, pain with urination, burning sensation, blood in urine, odor or discharge. Skin: Denies redness, rashes, lesions or ulcercations.   No other specific complaints in a complete review of systems (except as listed in HPI above).  Objective:   Physical Exam  BP 132/84   Pulse 79   Temp 97.9 F (36.6 C) (Oral)   Wt 212 lb (96.2 kg)   SpO2 99%   BMI 40.06 kg/m  Wt Readings from Last 3 Encounters:  09/23/16 212 lb (96.2 kg)  09/07/16 213 lb (96.6 kg)  08/11/16 217 lb 1.9 oz (98.5 kg)    General: Appears her stated age, obese in NAD. Skin: Warm, dry and intact. No rashes noted of upper chest or upper back. Neck:  No adenopathy noted  Cardiovascular: Normal rate and rhythm. Murmur noted. No carotid bruits noted. Pulmonary/Chest: Normal effort and positive vesicular breath sounds. No respiratory distress. No wheezes, rales or ronchi noted.  Abdomen: Soft and tender in the epigastric and LUQ. Normal bowel sounds. No distention or masses noted.  Musculoskeletal: Pain with palpation at the left ICS, mid axillary line and just medial to the left scapula. Neurological: Alert and oriented.   BMET    Component Value Date/Time   NA 143 04/25/2016 1407   K 4.2 04/25/2016 1407   CL 106 04/25/2016 1407   CO2 29 04/25/2016 1407   GLUCOSE 99 04/25/2016 1407   BUN 13 04/25/2016 1407   CREATININE 0.68 04/25/2016 1407   CALCIUM 9.5 04/25/2016 1407    Lipid Panel     Component Value Date/Time   CHOL 227 (H) 02/22/2016 0911   TRIG 126.0 02/22/2016 0911   HDL 54.40 02/22/2016 0911   CHOLHDL 4 02/22/2016 0911   VLDL 25.2 02/22/2016 0911   LDLCALC 148 (H) 02/22/2016 0911    CBC    Component Value Date/Time    WBC 9.3 04/25/2016 1407   RBC 4.89 04/25/2016 1407   HGB 14.2 04/25/2016 1407   HCT 42.8 04/25/2016 1407   PLT 195.0 04/25/2016 1407   MCV 87.6 04/25/2016 1407   MCHC 33.1 04/25/2016 1407   RDW 14.1 04/25/2016 1407   LYMPHSABS 2.8  04/25/2016 1407   MONOABS 0.8 04/25/2016 1407   EOSABS 0.2 04/25/2016 1407   BASOSABS 0.0 04/25/2016 1407    Hgb A1C Lab Results  Component Value Date   HGBA1C 5.7 02/22/2016            Assessment & Plan:   Epigastric Pain, LUQ Pain, Belching, Left Side Chest Pressure, Left Upper Back Pain:  GI Cocktail given in office ECG- left atrial enlargement, no acute findings Will check CBC, CMET, Amylase, Lipase today Stop Nexium eRx for Dexilant 60 mg daily eRx for Carafate 1 gm QID Diet given for gastritis  Return/ER precautions discussed Webb Silversmith, NP

## 2016-10-04 ENCOUNTER — Ambulatory Visit: Payer: Medicare Other | Admitting: Family Medicine

## 2016-10-13 ENCOUNTER — Ambulatory Visit (INDEPENDENT_AMBULATORY_CARE_PROVIDER_SITE_OTHER): Payer: Medicare Other | Admitting: Family

## 2016-10-13 ENCOUNTER — Encounter: Payer: Self-pay | Admitting: Family

## 2016-10-13 VITALS — BP 144/86 | HR 81 | Temp 97.6°F | Ht 61.0 in | Wt 215.6 lb

## 2016-10-13 DIAGNOSIS — E538 Deficiency of other specified B group vitamins: Secondary | ICD-10-CM

## 2016-10-13 DIAGNOSIS — Z23 Encounter for immunization: Secondary | ICD-10-CM

## 2016-10-13 DIAGNOSIS — R142 Eructation: Secondary | ICD-10-CM | POA: Diagnosis not present

## 2016-10-13 MED ORDER — CYANOCOBALAMIN 1000 MCG/ML IJ SOLN
1000.0000 ug | Freq: Once | INTRAMUSCULAR | Status: AC
  Administered 2016-10-13: 1000 ug via INTRAMUSCULAR

## 2016-10-13 NOTE — Progress Notes (Signed)
Subjective:    Patient ID: Morgan Hurst, female    DOB: 11/05/1945, 71 y.o.   MRN: [id number]  CC: Morgan Hurst is a 72 y.o. female who presents today for follow up.   HPI:  Seen 3 weeks ago for belching, diarrhea, epigastric burning Hasn't changed. Intermittent. Diarrhea 30  Minutes after eating. Describes stool as gold colored.   Belching occurs at no certain times. No changes in diet and has been following bland diet.   Feels how did 'when gallbladder pain.' Only relief is burping or walking and swing arms. 'like it bring gas out of chest.'  "Spells' are less often on carafate, dexilante  No vomiting, fever, blood in stool,  Black tarry stools, weight loss, abdominal pain.   'I do not think it is  My heart.' Denies exertional chest pain or pressure, numbness or tingling radiating to left arm or jaw, palpitations, dizziness, frequent headaches, changes in vision, or shortness of breath.     EKG 9/7 SR. LFTs, glucose normal.  h/o cholecystectomy EGD normal 04/2015; chronic gastric inflammation.   Saw Dr [name] 03/2015 echo showed normal left VF. Thought she was low risk for cholecystectomy  HISTORY:  Past Medical History:  Diagnosis Date  . Arthritis    lower back, right hip  . Borderline diabetes    PCP STARTED PT ON METFORMIN IN 2016 DUE TO ELEVATED GLUCOSE   . Complication of anesthesia    FIGHTING WHEN WAKING UP FROM ANESTHESIA  . Endometriosis   . Heart murmur   . Orthodontics    top front 3 teeth caps and bridge   Past Surgical History:  Procedure Laterality Date  . ABDOMINAL HYSTERECTOMY  age 43  . ANKLE SURGERY    . CHOLECYSTECTOMY N/A 04/10/2015   Procedure: LAPAROSCOPIC CHOLECYSTECTOMY WITH INTRAOPERATIVE CHOLANGIOGRAM;  Surgeon: Robert Bellow, MD;  Location: [facility];  Service: General;  Laterality: N/A;  . ESOPHAGOGASTRODUODENOSCOPY N/A 05/12/2015   Procedure: ESOPHAGOGASTRODUODENOSCOPY (EGD);  Surgeon: Hulen Luster, MD;  Location: [facility];  Service: Gastroenterology;  Laterality: N/A;  . VAGINAL DELIVERY  2   Family History  Problem Relation Age of Onset  . Diabetes Mother   . Cancer Mother        colon and breast  . Cancer Father        Leukemia  . Cancer Brother        colon and lung  . Cholelithiasis Daughter   . Heart disease Paternal Grandfather   . Cancer Other 29       colon    Allergies: Patient has no known allergies. Current Outpatient Prescriptions on File Prior to Visit  Medication Sig Dispense Refill  . acetaminophen (TYLENOL) 325 MG tablet Take by mouth.    Marland Kitchen aspirin 81 MG tablet Take 81 mg by mouth daily.    Marland Kitchen CALCIUM PO Take by mouth.    . celecoxib (CELEBREX) 200 MG capsule Take 1 capsule (200 mg total) by mouth 2 (two) times daily. 60 capsule 3  . Cholecalciferol (VITAMIN D3) 2000 units capsule Take by mouth.    . cyanocobalamin (,VITAMIN B-12,) 1000 MCG/ML injection Inject 1,000 mcg into the muscle every 30 (thirty) days.    Marland Kitchen dexlansoprazole (DEXILANT) 60 MG capsule Take 1 capsule (60 mg total) by mouth daily. 30 capsule 0  . esomeprazole (NEXIUM) 40 MG capsule Take by mouth.    . hydrocortisone (ANUSOL-HC) 2.5 % rectal cream Place 1 application rectally 2 (two) times  daily. 30 g 0  . metFORMIN (GLUCOPHAGE) 500 MG tablet Take 500 mg by mouth daily.    . ondansetron (ZOFRAN ODT) 4 MG disintegrating tablet Take 1 tablet (4 mg total) by mouth every 8 (eight) hours as needed for nausea or vomiting. 20 tablet 0  . OVER THE COUNTER MEDICATION Take 1 tablet by mouth as needed. TART CHERRY EXTRACT    . pravastatin (PRAVACHOL) 40 MG tablet Take 1 tablet (40 mg total) by mouth daily. 90 tablet 2  . rOPINIRole (REQUIP) 0.25 MG tablet Take 1 tablet (0.25 mg total) by mouth at bedtime as needed. 90 tablet 0  . sucralfate (CARAFATE) 1 GM/10ML suspension Take 10 mLs (1 g total) by mouth 4 (four) times daily -  with meals and at bedtime. 420 mL 0  . traMADol (ULTRAM) 50 MG tablet TAKE 1 TABLET BY MOUTH  DAILY AS NEEDED 30 tablet 1  . traZODone (DESYREL) 50 MG tablet Take 1 tablet (50 mg total) by mouth at bedtime. 90 tablet 1  . TURMERIC PO Take 1 tablet by mouth daily.     No current facility-administered medications on file prior to visit.     Social History  Substance Use Topics  . Smoking status: Never Smoker  . Smokeless tobacco: Never Used  . Alcohol use No    Review of Systems  Constitutional: Negative for chills and fever.  Respiratory: Negative for cough.   Cardiovascular: Negative for chest pain and palpitations.  Gastrointestinal: Positive for diarrhea. Negative for abdominal distention, abdominal pain, nausea and vomiting.  Genitourinary: Negative for dysuria and hematuria.      Objective:    BP (!) 144/86   Pulse 81   Temp 97.6 F (36.4 C) (Oral)   Ht 5' 1"  (1.549 m)   Wt 215 lb 9.6 oz (97.8 kg)   SpO2 97%   BMI 40.74 kg/m  BP Readings from Last 3 Encounters:  10/13/16 (!) 144/86  09/23/16 132/84  09/07/16 118/88   Wt Readings from Last 3 Encounters:  10/13/16 215 lb 9.6 oz (97.8 kg)  09/23/16 212 lb (96.2 kg)  09/07/16 213 lb (96.6 kg)    Physical Exam  Constitutional: She appears well-developed and well-nourished.  Eyes: Conjunctivae are normal.  Cardiovascular: Normal rate, regular rhythm, normal heart sounds and normal pulses.   Pulmonary/Chest: Effort normal and breath sounds normal. She has no wheezes. She has no rhonchi. She has no rales.  Abdominal: Soft. Normal appearance and bowel sounds are normal. She exhibits no distension, no fluid wave, no ascites and no mass. There is no tenderness. There is no rigidity, no rebound, no guarding and no CVA tenderness.  Neurological: She is alert.  Skin: Skin is warm and dry.  Psychiatric: She has a normal mood and affect. Her speech is normal and behavior is normal. Thought content normal.  Vitals reviewed.      Assessment & Plan:   Problem List Items Addressed This Visit      Other   B12  deficiency   Relevant Medications   cyanocobalamin ((VITAMIN B-12)) injection 1,000 mcg (Completed)   Burping - Primary    Etiology of symptom is nonspecific at this time. Reviewed recent EKG and agree with patient and I have low clinical suspicion for cardiac etiology at this time. She has a history cholecystectomy, fatty liver disease. This is been off-and-on complaint patient for over a year. She has some improvement with Carafate, dexilante. I was reassured by abdominal exam today, no severe  abdominal pain. We will do abdominal ultrasound at patient's request on Monday of next week. Pending stool cultures as patient continues to have diarrhea. We also jointly agreed was time to consult GI for further evaluation.  In the interim, have asked patient to remain vigilant and let me know of any new, worsening symptoms.      Relevant Orders   Ambulatory referral to Gastroenterology   C. difficile GDH and Toxin A/B   Gastrointestinal Pathogen Panel PCR   Fecal occult blood, imunochemical   US Abdomen Complete    Other Visit Diagnoses    Encounter for immunization       Relevant Orders   Flu vaccine HIGH DOSE PF (Completed)       I am having Ms. Depascale maintain her aspirin, OVER THE COUNTER MEDICATION, TURMERIC PO, cyanocobalamin, acetaminophen, Vitamin D3, CALCIUM PO, metFORMIN, esomeprazole, rOPINIRole, hydrocortisone, pravastatin, celecoxib, traZODone, traMADol, ondansetron, dexlansoprazole, and sucralfate. We administered cyanocobalamin.   Meds ordered this encounter  Medications  . cyanocobalamin ((VITAMIN B-12)) injection 1,000 mcg    Return precautions given.   Risks, benefits, and alternatives of the medications and treatment plan prescribed today were discussed, and patient expressed understanding.   Education regarding symptom management and diagnosis given to patient on AVS.  Continue to follow with Burnard Hawthorne, FNP for routine health maintenance.   [name] and I agreed with plan.   Mable Paris, FNP

## 2016-10-13 NOTE — Progress Notes (Signed)
Pre visit review using our clinic review tool, if applicable. No additional management support is needed unless otherwise documented below in the visit note. 

## 2016-10-13 NOTE — Patient Instructions (Signed)
Stools studies  Ultrasound of abdomen  Let me know if any worsening symptoms and if you you don't hear from [place] regarding GI

## 2016-10-13 NOTE — Assessment & Plan Note (Addendum)
Etiology of symptom is nonspecific at this time. Reviewed recent EKG and agree with patient and I have low clinical suspicion for cardiac etiology at this time. She has a history cholecystectomy, fatty liver disease. This is been off-and-on complaint patient for over a year. She has some improvement with Carafate, dexilante. I was reassured by abdominal exam today, no severe abdominal pain. We will do abdominal ultrasound at patient's request on Monday of next week. Pending stool cultures as patient continues to have diarrhea. We also jointly agreed was time to consult GI for further evaluation.  In the interim, have asked patient to remain vigilant and let me know of any new, worsening symptoms.

## 2016-10-17 ENCOUNTER — Telehealth: Payer: Self-pay | Admitting: Family

## 2016-10-17 ENCOUNTER — Ambulatory Visit
Admission: RE | Admit: 2016-10-17 | Discharge: 2016-10-17 | Disposition: A | Discharge status: Home / Self Care | Payer: Medicare Other | Source: Ambulatory Visit | Attending: Family | Admitting: Family

## 2016-10-17 DIAGNOSIS — N200 Calculus of kidney: Secondary | ICD-10-CM | POA: Diagnosis not present

## 2016-10-17 DIAGNOSIS — K760 Fatty (change of) liver, not elsewhere classified: Secondary | ICD-10-CM | POA: Diagnosis not present

## 2016-10-17 DIAGNOSIS — Z9049 Acquired absence of other specified parts of digestive tract: Secondary | ICD-10-CM | POA: Diagnosis not present

## 2016-10-17 DIAGNOSIS — R142 Eructation: Secondary | ICD-10-CM

## 2016-10-17 DIAGNOSIS — N281 Cyst of kidney, acquired: Secondary | ICD-10-CM | POA: Diagnosis not present

## 2016-10-17 NOTE — Telephone Encounter (Signed)
Morgan Hurst,   Call pt and see how she is doing.  Dr Candace Cruise no longer at [facility] so we are trying to see who can see her  Melissa,  Any status?

## 2016-10-17 NOTE — Progress Notes (Signed)
Dr. Candace Cruise is no longer at our local office at [facility]. I will find out who she can see there the quickest.

## 2016-10-18 ENCOUNTER — Telehealth: Payer: Self-pay | Admitting: Family

## 2016-10-18 DIAGNOSIS — Z1239 Encounter for other screening for malignant neoplasm of breast: Secondary | ICD-10-CM

## 2016-10-18 NOTE — Telephone Encounter (Signed)
Call pt  Reviewing chart and still hasn't had 3d mammogram  Ordered for her  Please ensure she phone number to schedule

## 2016-10-18 NOTE — Telephone Encounter (Signed)
Patient has phone number for mammography.  She will schedule at another time.

## 2016-10-26 NOTE — Telephone Encounter (Signed)
Patients daughter stated she is looking forward to appointment with GI this week, SX still the same overall.

## 2016-10-26 NOTE — Telephone Encounter (Signed)
See below note.

## 2016-11-15 ENCOUNTER — Ambulatory Visit: Payer: Medicare Other

## 2016-11-16 ENCOUNTER — Ambulatory Visit (INDEPENDENT_AMBULATORY_CARE_PROVIDER_SITE_OTHER): Payer: Medicare Other

## 2016-11-16 DIAGNOSIS — E538 Deficiency of other specified B group vitamins: Secondary | ICD-10-CM

## 2016-11-16 MED ORDER — CYANOCOBALAMIN 1000 MCG/ML IJ SOLN: 1000.0000 ug | Freq: Once | INTRAMUSCULAR | 0 refills | Status: DC

## 2016-11-16 MED ORDER — CYANOCOBALAMIN 1000 MCG/ML IJ SOLN
1000.0000 ug | Freq: Once | INTRAMUSCULAR | Status: AC
  Administered 2016-11-16: 1000 ug via INTRAMUSCULAR

## 2016-11-16 NOTE — Progress Notes (Signed)
Patient comes in for B 12 injection .   Injection right deltoid .  Patient tolerated injection well.   Agree with plan

## 2016-12-20 ENCOUNTER — Ambulatory Visit: Payer: Medicare Other

## 2016-12-23 ENCOUNTER — Other Ambulatory Visit: Payer: Self-pay | Admitting: Family

## 2016-12-23 DIAGNOSIS — R252 Cramp and spasm: Secondary | ICD-10-CM

## 2016-12-28 ENCOUNTER — Ambulatory Visit (INDEPENDENT_AMBULATORY_CARE_PROVIDER_SITE_OTHER): Payer: Medicare Other | Admitting: *Deleted

## 2016-12-28 DIAGNOSIS — E538 Deficiency of other specified B group vitamins: Secondary | ICD-10-CM | POA: Diagnosis not present

## 2016-12-28 MED ORDER — CYANOCOBALAMIN 1000 MCG/ML IJ SOLN
1000.0000 ug | Freq: Once | INTRAMUSCULAR | Status: AC
  Administered 2016-12-28: 1000 ug via INTRAMUSCULAR

## 2016-12-28 NOTE — Progress Notes (Signed)
Patient presented for B 12 injection to left deltoid, patient voiced no concerns nor showed any signs of distress during injection. 

## 2016-12-30 ENCOUNTER — Other Ambulatory Visit: Payer: Self-pay | Admitting: Family

## 2016-12-30 DIAGNOSIS — M48062 Spinal stenosis, lumbar region with neurogenic claudication: Secondary | ICD-10-CM

## 2016-12-30 NOTE — Telephone Encounter (Signed)
Refilled: 08/11/2016 Last OV: 10/13/2016 Next OV: 03/14/2017  Morgan Hurst's pt

## 2016-12-30 NOTE — Telephone Encounter (Signed)
Printed, signed and faxed.  

## 2017-01-19 ENCOUNTER — Other Ambulatory Visit: Payer: Self-pay | Admitting: Otolaryngology

## 2017-01-19 DIAGNOSIS — R221 Localized swelling, mass and lump, neck: Secondary | ICD-10-CM

## 2017-02-01 ENCOUNTER — Ambulatory Visit
Admission: RE | Admit: 2017-02-01 | Discharge: 2017-02-01 | Disposition: A | Discharge status: Home / Self Care | Payer: Medicare Other | Source: Ambulatory Visit | Attending: Otolaryngology | Admitting: Otolaryngology

## 2017-02-01 DIAGNOSIS — E119 Type 2 diabetes mellitus without complications: Secondary | ICD-10-CM | POA: Diagnosis present

## 2017-02-01 DIAGNOSIS — R221 Localized swelling, mass and lump, neck: Secondary | ICD-10-CM | POA: Diagnosis present

## 2017-02-01 DIAGNOSIS — Z7984 Long term (current) use of oral hypoglycemic drugs: Secondary | ICD-10-CM | POA: Insufficient documentation

## 2017-02-01 HISTORY — DX: Type 2 diabetes mellitus without complications: E11.9

## 2017-02-01 LAB — POCT I-STAT CREATININE: Creatinine, Ser: 0.6 mg/dL (ref 0.44–1.00)

## 2017-02-01 MED ORDER — IOPAMIDOL (ISOVUE-300) INJECTION 61%
75.0000 mL | Freq: Once | INTRAVENOUS | Status: AC | PRN
  Administered 2017-02-01: 75 mL via INTRAVENOUS

## 2017-02-08 ENCOUNTER — Ambulatory Visit (INDEPENDENT_AMBULATORY_CARE_PROVIDER_SITE_OTHER): Payer: Medicare Other

## 2017-02-08 DIAGNOSIS — E538 Deficiency of other specified B group vitamins: Secondary | ICD-10-CM

## 2017-02-08 MED ORDER — CYANOCOBALAMIN 1000 MCG/ML IJ SOLN
1000.0000 ug | Freq: Once | INTRAMUSCULAR | Status: AC
  Administered 2017-02-08: 1000 ug via INTRAMUSCULAR

## 2017-02-08 NOTE — Progress Notes (Signed)
Patient presented for B 12 injection to Right deltoid, patient voiced no concerns nor showed any signs of distress during injection

## 2017-02-13 ENCOUNTER — Encounter: Payer: Medicare Other | Admitting: Family

## 2017-03-14 ENCOUNTER — Ambulatory Visit (INDEPENDENT_AMBULATORY_CARE_PROVIDER_SITE_OTHER): Payer: Medicare Other

## 2017-03-14 ENCOUNTER — Encounter: Payer: Medicare Other | Admitting: Family

## 2017-03-14 DIAGNOSIS — E538 Deficiency of other specified B group vitamins: Secondary | ICD-10-CM | POA: Diagnosis not present

## 2017-03-14 MED ORDER — CYANOCOBALAMIN 1000 MCG/ML IJ SOLN
1000.0000 ug | Freq: Once | INTRAMUSCULAR | Status: AC
  Administered 2017-03-14: 1000 ug via INTRAMUSCULAR

## 2017-03-14 NOTE — Progress Notes (Signed)
Patient presents today for a B12 injection. Left deltoid. Pt voiced no concerns nor showed and distress during injection.

## 2017-03-17 ENCOUNTER — Encounter: Payer: Self-pay | Admitting: Family

## 2017-03-17 ENCOUNTER — Ambulatory Visit (INDEPENDENT_AMBULATORY_CARE_PROVIDER_SITE_OTHER): Payer: Medicare Other | Admitting: Family

## 2017-03-17 VITALS — BP 134/84 | HR 82 | Temp 97.9°F | Resp 15 | Ht 61.0 in | Wt 225.1 lb

## 2017-03-17 DIAGNOSIS — Z0000 Encounter for general adult medical examination without abnormal findings: Secondary | ICD-10-CM

## 2017-03-17 DIAGNOSIS — Z0001 Encounter for general adult medical examination with abnormal findings: Secondary | ICD-10-CM

## 2017-03-17 DIAGNOSIS — M545 Low back pain, unspecified: Secondary | ICD-10-CM

## 2017-03-17 DIAGNOSIS — K219 Gastro-esophageal reflux disease without esophagitis: Secondary | ICD-10-CM | POA: Diagnosis not present

## 2017-03-17 DIAGNOSIS — G8929 Other chronic pain: Secondary | ICD-10-CM | POA: Diagnosis not present

## 2017-03-17 DIAGNOSIS — R590 Localized enlarged lymph nodes: Secondary | ICD-10-CM

## 2017-03-17 DIAGNOSIS — R413 Other amnesia: Secondary | ICD-10-CM | POA: Diagnosis not present

## 2017-03-17 NOTE — Assessment & Plan Note (Signed)
Controlled on prn celebrex and tramadol. Will follow.

## 2017-03-17 NOTE — Patient Instructions (Addendum)
Fasting labs when you can  Let me know if you or your family notice any memory changes.   We placed a referral for mammogram this year. I asked that you call one the below locations and schedule this when it is convenient for you.   As discussed, I would like you to ask for 3D mammogram over the traditional 2D mammogram as new evidence suggest 3D is superior.   Please note that NOT all insurance companies cover 3D and you may have to pay a higher copay. You may call your insurance company to further clarify your benefits.   Options for [place]  [place], [place]  * [facility] [place] [place] * Note if you ask for 3D mammogram at this location, you must request [place], [place] location*   Health Maintenance, Female Adopting a healthy lifestyle and getting preventive care can go a long way to promote health and wellness. Talk with your health care provider about what schedule of regular examinations is right for you. This is a good chance for you to check in with your provider about disease prevention and staying healthy. In between checkups, there are plenty of things you can do on your own. Experts have done a lot of research about which lifestyle changes and preventive measures are most likely to keep you healthy. Ask your health care provider for more information. Weight and diet Eat a healthy diet  Be sure to include plenty of vegetables, fruits, low-fat dairy products, and lean protein.  Do not eat a lot of foods high in solid fats, added sugars, or salt.  Get regular exercise. This is one of the most important things you can do for your health. ? Most adults should exercise for at least 150 minutes each week. The exercise should increase your heart rate and make you sweat (moderate-intensity exercise). ? Most adults should  also do strengthening exercises at least twice a week. This is in addition to the moderate-intensity exercise.  Maintain a healthy weight  Body mass index (BMI) is a measurement that can be used to identify possible weight problems. It estimates body fat based on height and weight. Your health care provider can help determine your BMI and help you achieve or maintain a healthy weight.  For females 61 years of age and older: ? A BMI below 18.5 is considered underweight. ? A BMI of 18.5 to 24.9 is normal. ? A BMI of 25 to 29.9 is considered overweight. ? A BMI of 30 and above is considered obese.  Watch levels of cholesterol and blood lipids  You should start having your blood tested for lipids and cholesterol at 71 years of age, then have this test every 5 years.  You may need to have your cholesterol levels checked more often if: ? Your lipid or cholesterol levels are high. ? You are older than 71 years of age. ? You are at high risk for heart disease.  Cancer screening Lung Cancer  Lung cancer screening is recommended for adults 59-53 years old who are at high risk for lung cancer because of a history of smoking.  A yearly low-dose CT scan of the lungs is recommended for people who: ? Currently smoke. ? Have quit within the past 15 years. ? Have at least a 30-pack-year history of smoking. A pack year is smoking  an average of one pack of cigarettes a day for 1 year.  Yearly screening should continue until it has been 15 years since you quit.  Yearly screening should stop if you develop a health problem that would prevent you from having lung cancer treatment.  Breast Cancer  Practice breast self-awareness. This means understanding how your breasts normally appear and feel.  It also means doing regular breast self-exams. Let your health care provider know about any changes, no matter how small.  If you are in your 20s or 30s, you should have a clinical breast exam (CBE) by a  health care provider every 1-3 years as part of a regular health exam.  If you are 54 or older, have a CBE every year. Also consider having a breast X-ray (mammogram) every year.  If you have a family history of breast cancer, talk to your health care provider about genetic screening.  If you are at high risk for breast cancer, talk to your health care provider about having an MRI and a mammogram every year.  Breast cancer gene (BRCA) assessment is recommended for women who have family members with BRCA-related cancers. BRCA-related cancers include: ? Breast. ? Ovarian. ? Tubal. ? Peritoneal cancers.  Results of the assessment will determine the need for genetic counseling and BRCA1 and BRCA2 testing.  Cervical Cancer Your health care provider may recommend that you be screened regularly for cancer of the pelvic organs (ovaries, uterus, and vagina). This screening involves a pelvic examination, including checking for microscopic changes to the surface of your cervix (Pap test). You may be encouraged to have this screening done every 3 years, beginning at age 11.  For women ages 9-65, health care providers may recommend pelvic exams and Pap testing every 3 years, or they may recommend the Pap and pelvic exam, combined with testing for human papilloma virus (HPV), every 5 years. Some types of HPV increase your risk of cervical cancer. Testing for HPV may also be done on women of any age with unclear Pap test results.  Other health care providers may not recommend any screening for nonpregnant women who are considered low risk for pelvic cancer and who do not have symptoms. Ask your health care provider if a screening pelvic exam is right for you.  If you have had past treatment for cervical cancer or a condition that could lead to cancer, you need Pap tests and screening for cancer for at least 20 years after your treatment. If Pap tests have been discontinued, your risk factors (such as having  a new sexual partner) need to be reassessed to determine if screening should resume. Some women have medical problems that increase the chance of getting cervical cancer. In these cases, your health care provider may recommend more frequent screening and Pap tests.  Colorectal Cancer  This type of cancer can be detected and often prevented.  Routine colorectal cancer screening usually begins at 71 years of age and continues through 71 years of age.  Your health care provider may recommend screening at an earlier age if you have risk factors for colon cancer.  Your health care provider may also recommend using home test kits to check for hidden blood in the stool.  A small camera at the end of a tube can be used to examine your colon directly (sigmoidoscopy or colonoscopy). This is done to check for the earliest forms of colorectal cancer.  Routine screening usually begins at age 39.  Direct examination of  the colon should be repeated every 5-10 years through 71 years of age. However, you may need to be screened more often if early forms of precancerous polyps or small growths are found.  Skin Cancer  Check your skin from head to toe regularly.  Tell your health care provider about any new moles or changes in moles, especially if there is a change in a mole's shape or color.  Also tell your health care provider if you have a mole that is larger than the size of a pencil eraser.  Always use sunscreen. Apply sunscreen liberally and repeatedly throughout the day.  Protect yourself by wearing long sleeves, pants, a wide-brimmed hat, and sunglasses whenever you are outside.  Heart disease, diabetes, and high blood pressure  High blood pressure causes heart disease and increases the risk of stroke. High blood pressure is more likely to develop in: ? People who have blood pressure in the high end of the normal range (130-139/85-89 mm Hg). ? People who are overweight or obese. ? People who  are African American.  If you are 32-29 years of age, have your blood pressure checked every 3-5 years. If you are 25 years of age or older, have your blood pressure checked every year. You should have your blood pressure measured twice-once when you are at a hospital or clinic, and once when you are not at a hospital or clinic. Record the average of the two measurements. To check your blood pressure when you are not at a hospital or clinic, you can use: ? An automated blood pressure machine at a pharmacy. ? A home blood pressure monitor.  If you are between 49 years and 11 years old, ask your health care provider if you should take aspirin to prevent strokes.  Have regular diabetes screenings. This involves taking a blood sample to check your fasting blood sugar level. ? If you are at a normal weight and have a low risk for diabetes, have this test once every three years after 71 years of age. ? If you are overweight and have a high risk for diabetes, consider being tested at a younger age or more often. Preventing infection Hepatitis B  If you have a higher risk for hepatitis B, you should be screened for this virus. You are considered at high risk for hepatitis B if: ? You were born in a country where hepatitis B is common. Ask your health care provider which countries are considered high risk. ? Your parents were born in a high-risk country, and you have not been immunized against hepatitis B (hepatitis B vaccine). ? You have HIV or AIDS. ? You use needles to inject street drugs. ? You live with someone who has hepatitis B. ? You have had sex with someone who has hepatitis B. ? You get hemodialysis treatment. ? You take certain medicines for conditions, including cancer, organ transplantation, and autoimmune conditions.  Hepatitis C  Blood testing is recommended for: ? Everyone born from [birth year] through 1964. ? Anyone with known risk factors for hepatitis C.  Sexually transmitted  infections (STIs)  You should be screened for sexually transmitted infections (STIs) including gonorrhea and chlamydia if: ? You are sexually active and are younger than 71 years of age. ? You are older than 71 years of age and your health care provider tells you that you are at risk for this type of infection. ? Your sexual activity has changed since you were last screened and you are at  an increased risk for chlamydia or gonorrhea. Ask your health care provider if you are at risk.  If you do not have HIV, but are at risk, it may be recommended that you take a prescription medicine daily to prevent HIV infection. This is called pre-exposure prophylaxis (PrEP). You are considered at risk if: ? You are sexually active and do not regularly use condoms or know the HIV status of your partner(s). ? You take drugs by injection. ? You are sexually active with a partner who has HIV.  Talk with your health care provider about whether you are at high risk of being infected with HIV. If you choose to begin PrEP, you should first be tested for HIV. You should then be tested every 3 months for as long as you are taking PrEP. Pregnancy  If you are premenopausal and you may become pregnant, ask your health care provider about preconception counseling.  If you may become pregnant, take 400 to 800 micrograms (mcg) of folic acid every day.  If you want to prevent pregnancy, talk to your health care provider about birth control (contraception). Osteoporosis and menopause  Osteoporosis is a disease in which the bones lose minerals and strength with aging. This can result in serious bone fractures. Your risk for osteoporosis can be identified using a bone density scan.  If you are 67 years of age or older, or if you are at risk for osteoporosis and fractures, ask your health care provider if you should be screened.  Ask your health care provider whether you should take a calcium or vitamin D supplement to lower  your risk for osteoporosis.  Menopause may have certain physical symptoms and risks.  Hormone replacement therapy may reduce some of these symptoms and risks. Talk to your health care provider about whether hormone replacement therapy is right for you. Follow these instructions at home:  Schedule regular health, dental, and eye exams.  Stay current with your immunizations.  Do not use any tobacco products including cigarettes, chewing tobacco, or electronic cigarettes.  If you are pregnant, do not drink alcohol.  If you are breastfeeding, limit how much and how often you drink alcohol.  Limit alcohol intake to no more than 1 drink per day for nonpregnant women. One drink equals 12 ounces of beer, 5 ounces of wine, or 1 ounces of hard liquor.  Do not use street drugs.  Do not share needles.  Ask your health care provider for help if you need support or information about quitting drugs.  Tell your health care provider if you often feel depressed.  Tell your health care provider if you have ever been abused or do not feel safe at home. This information is not intended to replace advice given to you by your health care provider. Make sure you discuss any questions you have with your health care provider. Document Released: 07/19/2010 Document Revised: 06/11/2015 Document Reviewed: 10/07/2014 Elsevier Interactive Patient Education  [employer].

## 2017-03-17 NOTE — Assessment & Plan Note (Signed)
CBE performed. Defers pelvic exam in the absence of symptoms today and h/o hysterectomy. Encouraged exercise. Advised annual skiin check with dermatology

## 2017-03-17 NOTE — Assessment & Plan Note (Signed)
Patient has seen Dr Pryor Ochoa and had CT neck 01/2017. Discussed today continuing to stay vigilant.

## 2017-03-17 NOTE — Assessment & Plan Note (Addendum)
Unchanged. Is depression contributing? Patient declines medication ( such as SSRI) at this time.  Would like to know more about supplements. Taking b12. Mini Mental score 30/30. She declines cognitive testing at this time. Will follow.

## 2017-03-17 NOTE — Assessment & Plan Note (Signed)
Controlled on nexium. Has established with GI. Will follow.

## 2017-03-17 NOTE — Progress Notes (Signed)
Subjective:    Patient ID: UNITY LUEPKE, female    DOB: 26-Jan-1945, 72 y.o.   MRN: [id number]  CC: NELSIE DOMINO is a 72 y.o. female who presents today for physical exam.    HPI: Recently been to ENT for ringing sound in left ear, lymphadenopathy, on ear drops . CT neck from Dr Pryor Ochoa showed neck mass  GERD- controlled on nexium, has seen dr Alice Reichert, burping and diarrhea has improved.   Back pain- celebrex takes every few days. Takes tramadol half tab once in am , if needs it, and sometimes in the afternoon another half tablet.   Feels like words dont come as fast. Not getting lost. Notes slower recall. 'doesn't totally forget.' No family h/o dementia. No depression. Struggles with living alone since husband passed away. NO trouble getting motivated.     Colorectal Cancer Screening: due; last 2014 Breast Cancer Screening: Mammogram due Cervical Cancer Screening: h/o hysterectomy. No pelvic pain, bleeding.  Bone Health screening/DEXA for 65+: last 2017 showed osteopenia; due Lung Cancer Screening: Doesn't have 30 year pack year history and age > 16 years.       Tetanus - utd         Labs: Screening labs today. Exercise: Gets regular exercise.  Alcohol use: none Smoking/tobacco use: Nonsmoker.  Regular dental exams: In need of dental exam. Wears seat belt: Yes. No skin changes.   HISTORY:  Past Medical History:  Diagnosis Date  . Arthritis    lower back, right hip  . Borderline diabetes    PCP STARTED PT ON METFORMIN IN 2016 DUE TO ELEVATED GLUCOSE   . Complication of anesthesia    FIGHTING WHEN WAKING UP FROM ANESTHESIA  . Diabetes mellitus without complication ([place])   . Endometriosis   . Heart murmur   . Orthodontics    top front 3 teeth caps and bridge    Past Surgical History:  Procedure Laterality Date  . ABDOMINAL HYSTERECTOMY  age 39  . ANKLE SURGERY    . CHOLECYSTECTOMY N/A 04/10/2015   Procedure: LAPAROSCOPIC CHOLECYSTECTOMY WITH INTRAOPERATIVE  CHOLANGIOGRAM;  Surgeon: Robert Bellow, MD;  Location: [facility];  Service: General;  Laterality: N/A;  . ESOPHAGOGASTRODUODENOSCOPY N/A 05/12/2015   Procedure: ESOPHAGOGASTRODUODENOSCOPY (EGD);  Surgeon: Hulen Luster, MD;  Location: [place];  Service: Gastroenterology;  Laterality: N/A;  . VAGINAL DELIVERY  2   Family History  Problem Relation Age of Onset  . Diabetes Mother   . Cancer Mother        colon and breast  . Cancer Father        Leukemia  . Cancer Brother        colon and lung  . Cholelithiasis Daughter   . Heart disease Paternal Grandfather   . Cancer Other 29       colon      ALLERGIES: Patient has no known allergies.  Current Outpatient Medications on File Prior to Visit  Medication Sig Dispense Refill  . acetaminophen (TYLENOL) 325 MG tablet Take by mouth.    Marland Kitchen aspirin 81 MG tablet Take 81 mg by mouth daily.    Marland Kitchen CALCIUM PO Take by mouth.    . celecoxib (CELEBREX) 200 MG capsule Take 1 capsule (200 mg total) by mouth 2 (two) times daily. 60 capsule 3  . Cholecalciferol (VITAMIN D3) 2000 units capsule Take by mouth.    . esomeprazole (NEXIUM) 40 MG capsule Take by mouth.    . hydrocortisone (ANUSOL-HC)  2.5 % rectal cream Place 1 application rectally 2 (two) times daily. (Patient not taking: Reported on 03/17/2017) 30 g 0  . metFORMIN (GLUCOPHAGE) 500 MG tablet Take 500 mg by mouth daily.    . ondansetron (ZOFRAN ODT) 4 MG disintegrating tablet Take 1 tablet (4 mg total) by mouth every 8 (eight) hours as needed for nausea or vomiting. 20 tablet 0  . OVER THE COUNTER MEDICATION Take 1 tablet by mouth as needed. TART CHERRY EXTRACT    . pravastatin (PRAVACHOL) 40 MG tablet Take 1 tablet (40 mg total) by mouth daily. 90 tablet 2  . rOPINIRole (REQUIP) 0.25 MG tablet TAKE 1 TABLET(0.25 MG) BY MOUTH AT BEDTIME AS NEEDED 90 tablet 0  . traMADol (ULTRAM) 50 MG tablet TAKE 1 TABLET BY MOUTH DAILY AS NEEDED 30 tablet 0  . traZODone (DESYREL) 50 MG tablet Take 1  tablet (50 mg total) by mouth at bedtime. 90 tablet 1  . TURMERIC PO Take 1 tablet by mouth daily.     No current facility-administered medications on file prior to visit.     Social History   Tobacco Use  . Smoking status: Never Smoker  . Smokeless tobacco: Never Used  Substance Use Topics  . Alcohol use: No  . Drug use: No    Review of Systems  Constitutional: Negative for chills, fever and unexpected weight change.  HENT: Negative for congestion.   Respiratory: Negative for cough.   Cardiovascular: Negative for chest pain, palpitations and leg swelling.  Gastrointestinal: Negative for nausea and vomiting.  Musculoskeletal: Negative for arthralgias and myalgias.  Skin: Negative for rash.  Neurological: Negative for headaches.  Hematological: Negative for adenopathy.  Psychiatric/Behavioral: Negative for confusion.      Objective:    BP 134/84 (BP Location: Left Arm, Patient Position: Sitting, Cuff Size: Large)   Pulse 82   Temp 97.9 F (36.6 C) (Oral)   Resp 15   Ht 5' 1"  (1.549 m)   Wt 225 lb 2 oz (102.1 kg)   SpO2 96%   BMI 42.54 kg/m   BP Readings from Last 3 Encounters:  03/17/17 134/84  10/13/16 (!) 144/86  09/23/16 132/84   Wt Readings from Last 3 Encounters:  03/17/17 225 lb 2 oz (102.1 kg)  10/13/16 215 lb 9.6 oz (97.8 kg)  09/23/16 212 lb (96.2 kg)    Physical Exam  Constitutional: She appears well-developed and well-nourished.  Eyes: Conjunctivae are normal.  Neck: No thyroid mass and no thyromegaly present.  Cardiovascular: Normal rate, regular rhythm, normal heart sounds and normal pulses.  Pulmonary/Chest: Effort normal and breath sounds normal. She has no wheezes. She has no rhonchi. She has no rales. Right breast exhibits no inverted nipple, no mass, no nipple discharge, no skin change and no tenderness. Left breast exhibits no inverted nipple, no mass, no nipple discharge, no skin change and no tenderness. Breasts are symmetrical.  CBE  performed.   Lymphadenopathy:       Head (right side): No submental, no submandibular, no tonsillar, no preauricular, no posterior auricular and no occipital adenopathy present.       Head (left side): Submandibular adenopathy present. No submental, no tonsillar, no preauricular, no posterior auricular and no occipital adenopathy present.    She has no cervical adenopathy.       Right cervical: No superficial cervical, no deep cervical and no posterior cervical adenopathy present.      Left cervical: No superficial cervical, no deep cervical and no posterior  cervical adenopathy present.    She has no axillary adenopathy.  Left submandibular palpable mass. Nontender. No erythema, rash.   Neurological: She is alert.  Skin: Skin is warm and dry.  Psychiatric: She has a normal mood and affect. Her speech is normal and behavior is normal. Thought content normal.  Vitals reviewed.      Assessment & Plan:   Problem List Items Addressed This Visit      Digestive   GERD (gastroesophageal reflux disease)    Controlled on nexium. Has established with GI. Will follow.         Immune and Lymphatic   Enlarged lymph node in neck    Patient has seen Dr Pryor Ochoa and had CT neck 01/2017. Discussed today continuing to stay vigilant.         Other   Memory loss    Unchanged. Is depression contributing? Patient declines medication ( such as SSRI) at this time.  Would like to know more about supplements. Taking b12. Mini Mental score 30/30. She declines cognitive testing at this time. Will follow.       Routine physical examination - Primary    CBE performed. Defers pelvic exam in the absence of symptoms today and h/o hysterectomy. Encouraged exercise. Advised annual skiin check with dermatology      Relevant Orders   CBC with Differential/Platelet   Comprehensive metabolic panel   Hemoglobin A1c   Lipid panel   TSH   VITAMIN D 25 Hydroxy (Vit-D Deficiency, Fractures)   Ambulatory referral to  Gastroenterology   MM DIGITAL SCREENING BILATERAL   DG Bone Density   B12 and Folate Panel   Ambulatory referral to Dermatology   Chronic midline low back pain without sciatica    Controlled on prn celebrex and tramadol. Will follow.           I have discontinued Lucita Lora. Vecchiarelli's dexlansoprazole and sucralfate. I am also having her maintain her aspirin, OVER THE COUNTER MEDICATION, TURMERIC PO, acetaminophen, Vitamin D3, CALCIUM PO, metFORMIN, esomeprazole, hydrocortisone, pravastatin, celecoxib, traZODone, ondansetron, rOPINIRole, and traMADol.   No orders of the defined types were placed in this encounter.   Return precautions given.   Risks, benefits, and alternatives of the medications and treatment plan prescribed today were discussed, and patient expressed understanding.   Education regarding symptom management and diagnosis given to patient on AVS.   Continue to follow with Burnard Hawthorne, FNP for routine health maintenance.   [name] and I agreed with plan.   Mable Paris, FNP

## 2017-03-19 ENCOUNTER — Telehealth: Payer: Self-pay | Admitting: Family

## 2017-03-19 NOTE — Telephone Encounter (Signed)
[facility]   In reviewing your chart, it appears your are due for annual mammogram.  Please let us know if you would like for me to order. You may call the office to let us know, and we will order for you.   Once the order in the system, you may schedule at your preferred location.   Typically women have been using one of the sites below however you may go where you have been in the past. I would ensure that when you do get a mammogram that it is 3D ( as opposed to 2D which was prior technology). Evidence suggests that 3D is superior.   Please note that NOT all insurance companies cover 3D, and you may have to pay a higher copay. You may call your insurance company to further clarify your benefits.   Options for Mammogram in [place]:    [facility]  [place], [place]   [facility] [place], [place]   Let us know if you have questions.   My best,   Mable Paris, NP

## 2017-03-21 ENCOUNTER — Telehealth: Payer: Self-pay | Admitting: Family

## 2017-03-21 NOTE — Telephone Encounter (Signed)
[facility]   In reviewing your chart, it appears your are due for annual mammogram.  Please let us know if you would like for me to order. You may call the office to let us know, and we will order for you.   Once the order in the system, you may schedule at your preferred location.   Typically women have been using one of the sites below however you may go where you have been in the past. I would ensure that when you do get a mammogram that it is 3D ( as opposed to 2D which was prior technology). Evidence suggests that 3D is superior.   Please note that NOT all insurance companies cover 3D, and you may have to pay a higher copay. You may call your insurance company to further clarify your benefits.   Options for Mammogram in [place]:    [facility]  [place], [place]   [facility] [place], [place]   Let us know if you have questions.   My best,   Mable Paris, NP

## 2017-03-24 ENCOUNTER — Ambulatory Visit (INDEPENDENT_AMBULATORY_CARE_PROVIDER_SITE_OTHER): Payer: Medicare Other | Admitting: Family

## 2017-03-24 ENCOUNTER — Encounter: Payer: Self-pay | Admitting: Family

## 2017-03-24 VITALS — BP 136/82 | HR 83 | Temp 98.1°F | Ht 61.0 in | Wt 226.0 lb

## 2017-03-24 DIAGNOSIS — H66001 Acute suppurative otitis media without spontaneous rupture of ear drum, right ear: Secondary | ICD-10-CM

## 2017-03-24 DIAGNOSIS — J209 Acute bronchitis, unspecified: Secondary | ICD-10-CM | POA: Diagnosis not present

## 2017-03-24 MED ORDER — BENZONATATE 100 MG PO CAPS: 100.0000 mg | ORAL_CAPSULE | Freq: Three times a day (TID) | ORAL | 0 refills | Status: DC | PRN

## 2017-03-24 MED ORDER — ONDANSETRON HCL 4 MG PO TABS: 4.0000 mg | ORAL_TABLET | Freq: Three times a day (TID) | ORAL | 0 refills | Status: DC | PRN

## 2017-03-24 MED ORDER — LEVOFLOXACIN 500 MG PO TABS: 500.0000 mg | ORAL_TABLET | Freq: Every day | ORAL | 0 refills | Status: DC

## 2017-03-24 NOTE — Telephone Encounter (Signed)
Mychart message sent.

## 2017-03-24 NOTE — Progress Notes (Signed)
Morgan Hurst is a 72 y.o. female with the following history as recorded in EpicCare:  Patient Active Problem List   Diagnosis Date Noted  . GERD (gastroesophageal reflux disease) 03/17/2017  . Greater trochanteric bursitis of right hip 09/07/2016  . Elevated blood pressure reading 05/24/2016  . Chronic midline low back pain without sciatica 04/25/2016  . Routine physical examination 02/11/2016  . Enlarged lymph node in neck 02/11/2016  . Fatty liver disease, nonalcoholic 66/06/3014  . Nodule of left lung 11/25/2015  . Prediabetes 08/24/2015  . Burping 04/21/2015  . Muscle cramp 02/05/2015  . B12 deficiency 02/05/2015  . Cervical disc disorder with radiculopathy of cervical region 12/08/2014  . Right shoulder pain 12/08/2014  . Trapezius muscle spasm 11/20/2014  . Nonallopathic lesion of thoracic region 11/20/2014  . Trigger thumb 10/23/2014  . Pain of right thumb 10/07/2014  . Spinal stenosis, lumbar region, with neurogenic claudication 08/01/2014  . Hemorrhoid 06/18/2014  . Postmenopausal estrogen deficiency 03/18/2014  . Skin lesion 03/18/2014  . Severe obesity (BMI >= 40) ([place]) 02/14/2014  . Myalgia and myositis 02/14/2014  . Arthralgia 12/25/2012  . Pernicious anemia 12/25/2012  . Memory loss 12/25/2012  . Hyperlipidemia 01/25/2012  . Fatigue 12/26/2011    Current Outpatient Medications  Medication Sig Dispense Refill  . acetaminophen (TYLENOL) 325 MG tablet Take by mouth.    Marland Kitchen aspirin 81 MG tablet Take 81 mg by mouth daily.    Marland Kitchen CALCIUM PO Take by mouth.    . celecoxib (CELEBREX) 200 MG capsule Take 1 capsule (200 mg total) by mouth 2 (two) times daily. 60 capsule 3  . Cholecalciferol (VITAMIN D3) 2000 units capsule Take by mouth.    . colesevelam (WELCHOL) 625 MG tablet Take 625 mg by mouth daily.    . cyanocobalamin (,VITAMIN B-12,) 1000 MCG/ML injection Inject into the muscle.    . esomeprazole (NEXIUM) 40 MG capsule Take by mouth.    . metFORMIN (GLUCOPHAGE)  500 MG tablet Take 500 mg by mouth daily.    . mometasone (ELOCON) 0.1 % lotion APPLY 4  DROPS  TO EACH EAR CANAL HS ONCE PER WEEK AND PRF ITCHING AND DRY SKIN  12  . ondansetron (ZOFRAN) 4 MG tablet Take 1 tablet (4 mg total) by mouth every 8 (eight) hours as needed for nausea or vomiting. 20 tablet 0  . pravastatin (PRAVACHOL) 40 MG tablet Take 1 tablet (40 mg total) by mouth daily. 90 tablet 2  . rOPINIRole (REQUIP) 0.25 MG tablet TAKE 1 TABLET(0.25 MG) BY MOUTH AT BEDTIME AS NEEDED 90 tablet 0  . traMADol (ULTRAM) 50 MG tablet TAKE 1 TABLET BY MOUTH DAILY AS NEEDED 30 tablet 0  . traZODone (DESYREL) 50 MG tablet Take 1 tablet (50 mg total) by mouth at bedtime. 90 tablet 1  . benzonatate (TESSALON) 100 MG capsule Take 1 capsule (100 mg total) by mouth 3 (three) times daily as needed. 20 capsule 0  . levofloxacin (LEVAQUIN) 500 MG tablet Take 1 tablet (500 mg total) by mouth daily. 10 tablet 0   No current facility-administered medications for this visit.     Allergies: Patient has no known allergies.  Past Medical History:  Diagnosis Date  . Arthritis    lower back, right hip  . Borderline diabetes    PCP STARTED PT ON METFORMIN IN 2016 DUE TO ELEVATED GLUCOSE   . Complication of anesthesia    FIGHTING WHEN WAKING UP FROM ANESTHESIA  . Diabetes mellitus without complication ([place])   .  Endometriosis   . Heart murmur   . Orthodontics    top front 3 teeth caps and bridge    Past Surgical History:  Procedure Laterality Date  . ABDOMINAL HYSTERECTOMY  age 86  . ANKLE SURGERY    . CHOLECYSTECTOMY N/A 04/10/2015   Procedure: LAPAROSCOPIC CHOLECYSTECTOMY WITH INTRAOPERATIVE CHOLANGIOGRAM;  Surgeon: Robert Bellow, MD;  Location: [facility] ORS;  Service: General;  Laterality: N/A;  . ESOPHAGOGASTRODUODENOSCOPY N/A 05/12/2015   Procedure: ESOPHAGOGASTRODUODENOSCOPY (EGD);  Surgeon: Hulen Luster, MD;  Location: [facility];  Service: Gastroenterology;  Laterality: N/A;  . VAGINAL DELIVERY   2    Family History  Problem Relation Age of Onset  . Diabetes Mother   . Cancer Mother        colon and breast  . Cancer Father        Leukemia  . Cancer Brother        colon and lung  . Cholelithiasis Daughter   . Heart disease Paternal Grandfather   . Cancer Other 29       colon    Social History   Tobacco Use  . Smoking status: Never Smoker  . Smokeless tobacco: Never Used  Substance Use Topics  . Alcohol use: No    Subjective:  [facility] at [facility] for possible [facility] infection last Saturday; started on Amoxicillin 875 mg bid x 10 days and has actually gotten worse; notes that right ear is particularly painful; using OTC cough/ cold medication; feels like congestion has moved into chest; notes that she is prone to bronchitis; denies any chest pain or shortness of breath however.   Objective:  Vitals:   03/24/17 1115  BP: 136/82  Pulse: 83  Temp: 98.1 F (36.7 C)  TempSrc: Oral  SpO2: 98%  Weight: 226 lb (102.5 kg)  Height: 5' 1"  (1.549 m)    General: Well developed, well nourished, in no acute distress  Skin : Warm and dry.  Head: Normocephalic and atraumatic  Eyes: Sclera and conjunctiva clear; pupils round and reactive to light; extraocular movements intact  Ears: External normal; canals clear; right tympanic membranes erythematous Oropharynx: Pink, supple. No suspicious lesions  Neck: Supple without thyromegaly, adenopathy  Lungs: Respirations unlabored; clear to auscultation bilaterally without wheeze, rales, rhonchi  CVS exam: normal rate and regular rhythm.  Neurologic: Alert and oriented; speech intact; face symmetrical; moves all extremities well; CNII-XII intact without focal deficit   Assessment:  1. Acute suppurative otitis media of right ear without spontaneous rupture of tympanic membrane, recurrence not specified   2. Acute bronchitis, unspecified organism     Plan:  D/C Amoxicillin; change to Levaquin 500 mg qd x 10 days; Rx for [employer];  increase fluids, rest and follow-up worse, no better.   No Follow-up on file.  No orders of the defined types were placed in this encounter.   Requested Prescriptions   Signed Prescriptions Disp Refills  . ondansetron (ZOFRAN) 4 MG tablet 20 tablet 0    Sig: Take 1 tablet (4 mg total) by mouth every 8 (eight) hours as needed for nausea or vomiting.  Marland Kitchen levofloxacin (LEVAQUIN) 500 MG tablet 10 tablet 0    Sig: Take 1 tablet (500 mg total) by mouth daily.  . benzonatate (TESSALON) 100 MG capsule 20 capsule 0    Sig: Take 1 capsule (100 mg total) by mouth 3 (three) times daily as needed.

## 2017-03-28 ENCOUNTER — Other Ambulatory Visit: Payer: Medicare Other

## 2017-03-31 ENCOUNTER — Encounter: Payer: Self-pay | Admitting: Family

## 2017-04-07 ENCOUNTER — Other Ambulatory Visit: Payer: Self-pay

## 2017-04-07 DIAGNOSIS — Z1211 Encounter for screening for malignant neoplasm of colon: Secondary | ICD-10-CM

## 2017-04-11 ENCOUNTER — Other Ambulatory Visit (INDEPENDENT_AMBULATORY_CARE_PROVIDER_SITE_OTHER): Payer: Medicare Other

## 2017-04-11 ENCOUNTER — Ambulatory Visit (INDEPENDENT_AMBULATORY_CARE_PROVIDER_SITE_OTHER): Payer: Medicare Other | Admitting: *Deleted

## 2017-04-11 DIAGNOSIS — Z0000 Encounter for general adult medical examination without abnormal findings: Secondary | ICD-10-CM

## 2017-04-11 DIAGNOSIS — E538 Deficiency of other specified B group vitamins: Secondary | ICD-10-CM

## 2017-04-11 LAB — LIPID PANEL
CHOL/HDL RATIO: 4
Cholesterol: 194 mg/dL (ref 0–200)
HDL: 51 mg/dL (ref >39.00)
LDL Cholesterol: 119 mg/dL — ABNORMAL HIGH (ref 0–99)
NONHDL: 143.35
TRIGLYCERIDES: 122 mg/dL (ref 0.0–149.0)
VLDL: 24.4 mg/dL (ref 0.0–40.0)

## 2017-04-11 LAB — CBC WITH DIFFERENTIAL/PLATELET
Basophils Absolute: 0 10*3/uL (ref 0.0–0.1)
Basophils Relative: 0.5 % (ref 0.0–3.0)
EOS ABS: 0.3 10*3/uL (ref 0.0–0.7)
EOS PCT: 3.9 % (ref 0.0–5.0)
HCT: 43.4 % (ref 36.0–46.0)
Hemoglobin: 14.4 g/dL (ref 12.0–15.0)
LYMPHS ABS: 2.1 10*3/uL (ref 0.7–4.0)
Lymphocytes Relative: 27 % (ref 12.0–46.0)
MCHC: 33.3 g/dL (ref 30.0–36.0)
MCV: 87.4 fl (ref 78.0–100.0)
MONO ABS: 0.6 10*3/uL (ref 0.1–1.0)
Monocytes Relative: 7.5 % (ref 3.0–12.0)
NEUTROS ABS: 4.8 10*3/uL (ref 1.4–7.7)
Neutrophils Relative %: 61.1 % (ref 43.0–77.0)
PLATELETS: 184 10*3/uL (ref 150.0–400.0)
RBC: 4.97 Mil/uL (ref 3.87–5.11)
RDW: 13.8 % (ref 11.5–15.5)
WBC: 7.9 10*3/uL (ref 4.0–10.5)

## 2017-04-11 LAB — COMPREHENSIVE METABOLIC PANEL
ALBUMIN: 3.8 g/dL (ref 3.5–5.2)
ALT: 16 U/L (ref 0–35)
AST: 18 U/L (ref 0–37)
Alkaline Phosphatase: 72 U/L (ref 39–117)
BUN: 15 mg/dL (ref 6–23)
CHLORIDE: 106 meq/L (ref 96–112)
CO2: 26 meq/L (ref 19–32)
CREATININE: 0.55 mg/dL (ref 0.40–1.20)
Calcium: 9.3 mg/dL (ref 8.4–10.5)
GFR: 115.86 mL/min (ref >60.00)
GLUCOSE: 115 mg/dL — AB (ref 70–99)
POTASSIUM: 4 meq/L (ref 3.5–5.1)
SODIUM: 140 meq/L (ref 135–145)
Total Bilirubin: 0.6 mg/dL (ref 0.2–1.2)
Total Protein: 7 g/dL (ref 6.0–8.3)

## 2017-04-11 LAB — B12 AND FOLATE PANEL
FOLATE: 19.8 ng/mL (ref >5.9)
Vitamin B-12: >1500 pg/mL — ABNORMAL HIGH (ref 211–911)

## 2017-04-11 LAB — VITAMIN D 25 HYDROXY (VIT D DEFICIENCY, FRACTURES): VITD: 23.64 ng/mL — ABNORMAL LOW (ref 30.00–100.00)

## 2017-04-11 LAB — TSH: TSH: 2.94 u[IU]/mL (ref 0.35–4.50)

## 2017-04-11 LAB — HEMOGLOBIN A1C: Hgb A1c MFr Bld: 6 % (ref 4.6–6.5)

## 2017-04-11 MED ORDER — CYANOCOBALAMIN 1000 MCG/ML IJ SOLN
1000.0000 ug | Freq: Once | INTRAMUSCULAR | Status: AC
  Administered 2017-04-11: 1000 ug via INTRAMUSCULAR

## 2017-04-11 NOTE — Progress Notes (Signed)
Patient presented for B 12 injection to right deltoid, patient voiced no concerns nor showed any signs of distress during injection. 

## 2017-04-14 ENCOUNTER — Encounter: Payer: Self-pay | Admitting: Family

## 2017-04-14 ENCOUNTER — Telehealth: Payer: Self-pay | Admitting: Family

## 2017-04-14 NOTE — Telephone Encounter (Signed)
Unread mychart  Ms Lineberry,   [place] you are well.    In reviewing your chart, it appears your are due for annual mammogram.    Please let us know if you would like for me to order. You may call the office to let us know, and we will order for you.    Once the order in the system, you may schedule at your preferred location.    Typically women have been using one of the sites below however you may go where you have been in the past. I would ensure that when you do get a mammogram that it is 3D ( as opposed to 2D which was prior technology). Evidence suggests that 3D is superior.    Please note that NOT all insurance companies cover 3D,and you may have to pay a higher copay. You may call your insurance company to further clarify your benefits.    Options for [place]:    [facility]  [place], [place]    [facility]  [place], [place]      Let us know if you have questions.    My best,    Mable Paris, NP

## 2017-04-18 ENCOUNTER — Encounter: Payer: Self-pay | Admitting: *Deleted

## 2017-04-18 NOTE — Telephone Encounter (Signed)
Letter mailed

## 2017-04-19 ENCOUNTER — Other Ambulatory Visit: Payer: Self-pay | Admitting: Internal Medicine

## 2017-04-19 DIAGNOSIS — M48062 Spinal stenosis, lumbar region with neurogenic claudication: Secondary | ICD-10-CM

## 2017-04-20 NOTE — Telephone Encounter (Signed)
Refilled: 12/30/2016 Last OV: 03/17/2017 Next OV: not scheduled

## 2017-04-21 NOTE — Telephone Encounter (Signed)
I looked up patient on [place] Controlled Substances Reporting System and saw no activity that raised concern of inappropriate use.

## 2017-04-24 NOTE — Telephone Encounter (Signed)
Unread mychart message mailed to patient 

## 2017-05-13 ENCOUNTER — Encounter: Payer: Self-pay | Admitting: Family

## 2017-05-13 ENCOUNTER — Other Ambulatory Visit: Payer: Self-pay | Admitting: Family

## 2017-05-13 DIAGNOSIS — E785 Hyperlipidemia, unspecified: Secondary | ICD-10-CM

## 2017-05-13 MED ORDER — ROSUVASTATIN CALCIUM 20 MG PO TABS: 20.0000 mg | ORAL_TABLET | Freq: Every day | ORAL | 1 refills | Status: DC

## 2017-05-13 NOTE — Progress Notes (Signed)
close

## 2017-05-28 ENCOUNTER — Telehealth: Payer: Self-pay | Admitting: Family

## 2017-05-28 NOTE — Telephone Encounter (Signed)
Call pt She didn't get my chart message. Please advise her to read my message from 4/27 and ensure she has no questions

## 2017-05-30 NOTE — Telephone Encounter (Signed)
Left message to return call 

## 2017-05-31 NOTE — Telephone Encounter (Signed)
Patient is returning [place] call. Please contact. [phone]

## 2017-05-31 NOTE — Telephone Encounter (Signed)
Please advise 

## 2017-05-31 NOTE — Telephone Encounter (Signed)
Spoke to patient in regards to mychart message you sent on 05/13/17 . Lab appointment scheduled .

## 2017-06-09 ENCOUNTER — Emergency Department
Admission: EM | Admit: 2017-06-09 | Discharge: 2017-06-09 | Disposition: A | Discharge status: Home / Self Care | Payer: Medicare Other | Attending: Emergency Medicine | Admitting: Emergency Medicine

## 2017-06-09 ENCOUNTER — Other Ambulatory Visit: Payer: Self-pay

## 2017-06-09 ENCOUNTER — Encounter: Payer: Self-pay | Admitting: Emergency Medicine

## 2017-06-09 DIAGNOSIS — R42 Dizziness and giddiness: Secondary | ICD-10-CM | POA: Insufficient documentation

## 2017-06-09 DIAGNOSIS — R0789 Other chest pain: Secondary | ICD-10-CM | POA: Diagnosis not present

## 2017-06-09 DIAGNOSIS — Z7984 Long term (current) use of oral hypoglycemic drugs: Secondary | ICD-10-CM | POA: Insufficient documentation

## 2017-06-09 DIAGNOSIS — R6883 Chills (without fever): Secondary | ICD-10-CM | POA: Diagnosis not present

## 2017-06-09 DIAGNOSIS — R112 Nausea with vomiting, unspecified: Secondary | ICD-10-CM | POA: Diagnosis present

## 2017-06-09 DIAGNOSIS — R531 Weakness: Secondary | ICD-10-CM | POA: Diagnosis not present

## 2017-06-09 DIAGNOSIS — A080 Rotaviral enteritis: Secondary | ICD-10-CM | POA: Insufficient documentation

## 2017-06-09 DIAGNOSIS — E119 Type 2 diabetes mellitus without complications: Secondary | ICD-10-CM | POA: Diagnosis not present

## 2017-06-09 DIAGNOSIS — Z7982 Long term (current) use of aspirin: Secondary | ICD-10-CM | POA: Insufficient documentation

## 2017-06-09 DIAGNOSIS — R638 Other symptoms and signs concerning food and fluid intake: Secondary | ICD-10-CM | POA: Diagnosis not present

## 2017-06-09 DIAGNOSIS — R197 Diarrhea, unspecified: Secondary | ICD-10-CM | POA: Diagnosis not present

## 2017-06-09 DIAGNOSIS — Z79899 Other long term (current) drug therapy: Secondary | ICD-10-CM | POA: Diagnosis not present

## 2017-06-09 LAB — GASTROINTESTINAL PANEL BY PCR, STOOL (REPLACES STOOL CULTURE)

## 2017-06-09 LAB — LIPASE, BLOOD: LIPASE: 23 U/L (ref 11–51)

## 2017-06-09 LAB — COMPREHENSIVE METABOLIC PANEL
ALBUMIN: 4.2 g/dL (ref 3.5–5.0)
ALT: 31 U/L (ref 14–54)
AST: 39 U/L (ref 15–41)
Alkaline Phosphatase: 74 U/L (ref 38–126)
Anion gap: 11 (ref 5–15)
BUN: 19 mg/dL (ref 6–20)
CHLORIDE: 108 mmol/L (ref 101–111)
CO2: 18 mmol/L — AB (ref 22–32)
CREATININE: 0.94 mg/dL (ref 0.44–1.00)
Calcium: 9.7 mg/dL (ref 8.9–10.3)
GFR calc non Af Amer: 60 mL/min — ABNORMAL LOW (ref >60)
Glucose, Bld: 112 mg/dL — ABNORMAL HIGH (ref 65–99)
Potassium: 3.8 mmol/L (ref 3.5–5.1)
SODIUM: 137 mmol/L (ref 135–145)
Total Bilirubin: 0.6 mg/dL (ref 0.3–1.2)
Total Protein: 8.4 g/dL — ABNORMAL HIGH (ref 6.5–8.1)

## 2017-06-09 LAB — C DIFFICILE QUICK SCREEN W PCR REFLEX
C DIFFICLE (CDIFF) ANTIGEN: NEGATIVE
C Diff interpretation: NOT DETECTED
C Diff toxin: NEGATIVE

## 2017-06-09 LAB — CBC
HEMATOCRIT: 49.7 % — AB (ref 35.0–47.0)
Hemoglobin: 16.8 g/dL — ABNORMAL HIGH (ref 12.0–16.0)
MCH: 29 pg (ref 26.0–34.0)
MCHC: 33.9 g/dL (ref 32.0–36.0)
MCV: 85.6 fL (ref 80.0–100.0)
PLATELETS: 218 10*3/uL (ref 150–440)
RBC: 5.8 MIL/uL — ABNORMAL HIGH (ref 3.80–5.20)
RDW: 14.6 % — AB (ref 11.5–14.5)
WBC: 7.1 10*3/uL (ref 3.6–11.0)

## 2017-06-09 LAB — TROPONIN I: Troponin I: <0.03 ng/mL (ref <0.03)

## 2017-06-09 MED ORDER — ONDANSETRON 4 MG PO TBDP: 4.0000 mg | ORAL_TABLET | Freq: Three times a day (TID) | ORAL | 0 refills | Status: DC | PRN

## 2017-06-09 MED ORDER — ONDANSETRON HCL 4 MG/2ML IJ SOLN
4.0000 mg | Freq: Once | INTRAMUSCULAR | Status: AC
  Administered 2017-06-09: 4 mg via INTRAVENOUS
  Filled 2017-06-09: qty 2

## 2017-06-09 MED ORDER — ALUM & MAG HYDROXIDE-SIMETH 200-200-20 MG/5ML PO SUSP
30.0000 mL | Freq: Once | ORAL | Status: AC
  Administered 2017-06-09: 30 mL via ORAL
  Filled 2017-06-09: qty 30

## 2017-06-09 MED ORDER — ACETAMINOPHEN 500 MG PO TABS
1000.0000 mg | ORAL_TABLET | Freq: Once | ORAL | Status: AC
  Administered 2017-06-09: 1000 mg via ORAL
  Filled 2017-06-09: qty 2

## 2017-06-09 MED ORDER — LOPERAMIDE HCL 2 MG PO CAPS
4.0000 mg | ORAL_CAPSULE | Freq: Once | ORAL | Status: AC
  Administered 2017-06-09: 4 mg via ORAL
  Filled 2017-06-09: qty 2

## 2017-06-09 MED ORDER — SODIUM CHLORIDE 0.9 % IV BOLUS
1000.0000 mL | Freq: Once | INTRAVENOUS | Status: AC
  Administered 2017-06-09: 1000 mL via INTRAVENOUS

## 2017-06-09 MED ORDER — LOPERAMIDE HCL 2 MG PO TABS: 2.0000 mg | ORAL_TABLET | Freq: Four times a day (QID) | ORAL | 0 refills | Status: DC | PRN

## 2017-06-09 NOTE — ED Provider Notes (Signed)
[facility] Emergency Department Provider Note  Time seen: 3:24 PM  I have reviewed the triage vital signs and the nursing notes.   HISTORY  Chief Complaint Emesis and Diarrhea    HPI Morgan Hurst is a 72 y.o. female with a past medical history of diabetes, gastric reflux, presents to the emergency department for nausea vomiting and diarrhea.  According to the patient for the past 4 days she has been experiencing nausea vomiting and frequent episodes of very watery diarrhea.  She states last week her grandchild had similar symptoms, but no one else in the family has developed symptoms besides these 2.  Patient denies abdominal pain but states she will have distention and feel like there is a lot of gas in her upper abdomen and somewhat into her left chest.  She states that she is able to belch then the pain goes away.  She states she has had 12 episodes of diarrhea since last night.  States continued nausea with intermittent vomiting last episode of vomiting was at 530 this morning.  Denies any black or bloody stool or vomit.  Denies any fever but states she has had chills and feels somewhat weak and lightheaded at times.  Believes she is dehydrated.  Has not taken anything over-the-counter for the nausea or diarrhea, did take 1 dose of Carafate on Monday but has not had any more since.  Patient states she is having trouble keeping fluids down and has not kept any food down for the past few days.   Past Medical History:  Diagnosis Date  . Arthritis    lower back, right hip  . Borderline diabetes    PCP STARTED PT ON METFORMIN IN 2016 DUE TO ELEVATED GLUCOSE   . Complication of anesthesia    FIGHTING WHEN WAKING UP FROM ANESTHESIA  . Diabetes mellitus without complication ([place])   . Endometriosis   . Heart murmur   . Orthodontics    top front 3 teeth caps and bridge    Patient Active Problem List   Diagnosis Date Noted  . GERD (gastroesophageal reflux  disease) 03/17/2017  . Greater trochanteric bursitis of right hip 09/07/2016  . Elevated blood pressure reading 05/24/2016  . Chronic midline low back pain without sciatica 04/25/2016  . Routine physical examination 02/11/2016  . Enlarged lymph node in neck 02/11/2016  . Fatty liver disease, nonalcoholic 85/46/2703  . Nodule of left lung 11/25/2015  . Prediabetes 08/24/2015  . Burping 04/21/2015  . Muscle cramp 02/05/2015  . B12 deficiency 02/05/2015  . Cervical disc disorder with radiculopathy of cervical region 12/08/2014  . Right shoulder pain 12/08/2014  . Trapezius muscle spasm 11/20/2014  . Nonallopathic lesion of thoracic region 11/20/2014  . Trigger thumb 10/23/2014  . Pain of right thumb 10/07/2014  . Spinal stenosis, lumbar region, with neurogenic claudication 08/01/2014  . Hemorrhoid 06/18/2014  . Postmenopausal estrogen deficiency 03/18/2014  . Skin lesion 03/18/2014  . Severe obesity (BMI >= 40) ([place]) 02/14/2014  . Myalgia and myositis 02/14/2014  . Arthralgia 12/25/2012  . Pernicious anemia 12/25/2012  . Memory loss 12/25/2012  . Hyperlipidemia 01/25/2012  . Fatigue 12/26/2011    Past Surgical History:  Procedure Laterality Date  . ABDOMINAL HYSTERECTOMY  age 60  . ANKLE SURGERY    . CHOLECYSTECTOMY N/A 04/10/2015   Procedure: LAPAROSCOPIC CHOLECYSTECTOMY WITH INTRAOPERATIVE CHOLANGIOGRAM;  Surgeon: Robert Bellow, MD;  Location: [facility] ORS;  Service: General;  Laterality: N/A;  . ESOPHAGOGASTRODUODENOSCOPY N/A 05/12/2015  Procedure: ESOPHAGOGASTRODUODENOSCOPY (EGD);  Surgeon: Hulen Luster, MD;  Location: [place];  Service: Gastroenterology;  Laterality: N/A;  . VAGINAL DELIVERY  2    Prior to Admission medications   Medication Sig Start Date End Date Taking? Authorizing Provider  acetaminophen (TYLENOL) 325 MG tablet Take by mouth.    [provider]  aspirin 81 MG tablet Take 81 mg by mouth daily.    [provider]  benzonatate  (TESSALON) 100 MG capsule Take 1 capsule (100 mg total) by mouth 3 (three) times daily as needed. 03/24/17   Marrian Salvage, FNP  CALCIUM PO Take by mouth.    [provider]  celecoxib (CELEBREX) 200 MG capsule Take 1 capsule (200 mg total) by mouth 2 (two) times daily. 05/24/16   Burnard Hawthorne, FNP  Cholecalciferol (VITAMIN D3) 2000 units capsule Take by mouth.    [provider]  colesevelam (WELCHOL) 625 MG tablet Take 625 mg by mouth daily.    [provider]  cyanocobalamin (,VITAMIN B-12,) 1000 MCG/ML injection Inject into the muscle.    [provider]  esomeprazole (NEXIUM) 40 MG capsule Take by mouth. 05/21/15   [provider]  levofloxacin (LEVAQUIN) 500 MG tablet Take 1 tablet (500 mg total) by mouth daily. 03/24/17   Marrian Salvage, FNP  metFORMIN (GLUCOPHAGE) 500 MG tablet Take 500 mg by mouth daily.    [provider]  mometasone (ELOCON) 0.1 % lotion APPLY 4  DROPS  TO EACH EAR CANAL HS ONCE PER WEEK AND PRF ITCHING AND DRY SKIN 01/20/17   [provider]  ondansetron (ZOFRAN) 4 MG tablet Take 1 tablet (4 mg total) by mouth every 8 (eight) hours as needed for nausea or vomiting. 03/24/17   Marrian Salvage, FNP  rOPINIRole (REQUIP) 0.25 MG tablet TAKE 1 TABLET(0.25 MG) BY MOUTH AT BEDTIME AS NEEDED 12/23/16   Burnard Hawthorne, FNP  rosuvastatin (CRESTOR) 20 MG tablet Take 1 tablet (20 mg total) by mouth daily. 05/13/17   Burnard Hawthorne, FNP  traMADol (ULTRAM) 50 MG tablet TAKE 1 TABLET BY MOUTH DAILY AS NEEDED 04/21/17   Burnard Hawthorne, FNP  traZODone (DESYREL) 50 MG tablet Take 1 tablet (50 mg total) by mouth at bedtime. 05/24/16   Burnard Hawthorne, FNP    No Known Allergies  Family History  Problem Relation Age of Onset  . Diabetes Mother   . Cancer Mother        colon and breast  . Cancer Father        Leukemia  . Cancer Brother        colon and lung  . Cholelithiasis Daughter   .  Heart disease Paternal Grandfather   . Cancer Other 45       colon    Social History Social History   Tobacco Use  . Smoking status: Never Smoker  . Smokeless tobacco: Never Used  Substance Use Topics  . Alcohol use: No  . Drug use: No    Review of Systems Constitutional: Negative for fever. Eyes: Negative for visual complaints ENT: Negative for recent illness/congestion Cardiovascular: Mild left chest pain, resolved with belching.  None currently. Respiratory: Negative for shortness of breath. Gastrointestinal: Abdominal distention, mostly resolved with belching per patient.  Positive for nausea vomiting diarrhea x4 days. Genitourinary: Negative for urinary compaints Musculoskeletal: Negative for musculoskeletal complaints Skin: Negative for skin complaints  Neurological: Negative for headache All other ROS negative  ____________________________________________  PHYSICAL EXAM:  VITAL SIGNS: ED Triage Vitals  Enc Vitals Group     BP 06/09/17 1342 133/71     Pulse Rate 06/09/17 1342 95     Resp 06/09/17 1342 16     Temp 06/09/17 1342 98.4 F (36.9 C)     Temp Source 06/09/17 1342 Oral     SpO2 06/09/17 1342 97 %     Weight --      Height --      Head Circumference --      Peak Flow --      Pain Score 06/09/17 1343 8     Pain Loc --      Pain Edu? --      Excl. in [place]? --    Constitutional: Alert and oriented. Well appearing and in no distress. Eyes: Normal exam ENT   Head: Normocephalic and atraumatic.   Mouth/Throat: Mucous membranes are moist. Cardiovascular: Normal rate, regular rhythm. No murmur Respiratory: Normal respiratory effort without tachypnea nor retractions. Breath sounds are clear Gastrointestinal: Soft and nontender.  Obese, hyperactive bowel sounds. Musculoskeletal: Nontender with normal range of motion in all extremities.  Neurologic:  Normal speech and language. No gross focal neurologic deficits Skin:  Skin is warm, dry and  intact.  Psychiatric: Mood and affect are normal  ____________________________________________  INITIAL IMPRESSION / ASSESSMENT AND PLAN / ED COURSE  Pertinent labs & imaging results that were available during my care of the patient were reviewed by me and considered in my medical decision making (see chart for details).  Patient presents to the emergency department for nausea vomiting diarrhea over the past 4 days.  Patient states she feels very dehydrated, called her GI doctor today and they referred her to the emergency department.  Differential includes gastroenteritis, enteritis, gastritis, infectious etiology, C. difficile colitis although no recent antibiotic usage, we will check labs, IV hydrate, treat with Zofran and loperamide.  Will attempt to collect stool for stool analysis.  Blood work is largely within normal limits besides mild hemoconcentration consistent with dehydration.  I have added on a troponin as a precaution, urinalysis and stool studies pending.  Overall the patient appears well, lying in bed, no distress.  Patient stool antigen test positive for rotavirus.  Overall patient appears well, we will discharge with nausea medication, loperamide to be used as needed.  I discussed the importance of adequate hydration as well as hand hygiene and cleaning with bleach-containing solutions.  Patient is to follow-up with her primary care doctor. ____________________________________________   FINAL CLINICAL IMPRESSION(S) / ED DIAGNOSES  Nausea vomiting Diarrhea    Harvest Dark, MD 06/09/17 1929

## 2017-06-09 NOTE — ED Triage Notes (Signed)
Pt to ED via POV for emesis and diarrhea since Tuesday. Pt states that she has had 12 episodes of diarrhea in the last 12 hours. Pt states that she has felt weak. Pt states that she is unable to eat or drink anything because it goes right threw her. Pt is in NAD at this time.

## 2017-06-09 NOTE — ED Notes (Signed)
Informed RN that patient has been roomed and is ready for evaluation.  Patient in NAD at this time and call bell placed within reach.   

## 2017-07-07 ENCOUNTER — Telehealth: Payer: Self-pay | Admitting: *Deleted

## 2017-07-07 NOTE — Telephone Encounter (Signed)
Called and notified patient daughter ( on [place]) that b12 is normal and that she can stop B12 asked daughter to ask mother if she feels better when taking the and to call the office back. OK for [place] to get this information and document.

## 2017-07-07 NOTE — Telephone Encounter (Signed)
Patient states that she will prefer to stay on the B12 because she can tell a difference when she is not on it. She says the oral  b12 supplements to do not work.

## 2017-07-07 NOTE — Telephone Encounter (Signed)
Please call patient back about B12 instructions.

## 2017-07-07 NOTE — Telephone Encounter (Signed)
Copied from [place] [phone]. Topic: Appointment Scheduling - Scheduling Inquiry for Clinic >> Jul 07, 2017 10:11 AM Nils Flack wrote: Reason for CRM: pt would like to have b12 shot. Please call [phone]. Leave message if no answer

## 2017-07-07 NOTE — Telephone Encounter (Signed)
Call pt b12 normal now.   She really doesn't need b12 injections..   Does she find she feels  Better on them?  She may try taking mulitvitamin with b12?  If she agrees to stop, please dc b12 from med list.

## 2017-07-07 NOTE — Telephone Encounter (Signed)
Thanks fine Please refill

## 2017-07-07 NOTE — Telephone Encounter (Signed)
Last B 12 level 04/11/17  > 1500 ok to give B 12?

## 2017-07-10 ENCOUNTER — Encounter: Payer: Self-pay | Admitting: Anesthesiology

## 2017-07-10 ENCOUNTER — Ambulatory Visit
Admission: RE | Admit: 2017-07-10 | Discharge: 2017-07-10 | Disposition: A | Discharge status: Home / Self Care | Payer: Medicare Other | Source: Ambulatory Visit | Attending: Gastroenterology | Admitting: Gastroenterology

## 2017-07-10 ENCOUNTER — Ambulatory Visit: Payer: Medicare Other | Admitting: Anesthesiology

## 2017-07-10 ENCOUNTER — Encounter
Admission: RE | Disposition: A | Discharge status: Home / Self Care | Payer: Self-pay | Source: Ambulatory Visit | Attending: Gastroenterology

## 2017-07-10 DIAGNOSIS — Z7982 Long term (current) use of aspirin: Secondary | ICD-10-CM | POA: Diagnosis not present

## 2017-07-10 DIAGNOSIS — Z79899 Other long term (current) drug therapy: Secondary | ICD-10-CM | POA: Diagnosis not present

## 2017-07-10 DIAGNOSIS — Z1211 Encounter for screening for malignant neoplasm of colon: Secondary | ICD-10-CM | POA: Insufficient documentation

## 2017-07-10 DIAGNOSIS — R7309 Other abnormal glucose: Secondary | ICD-10-CM | POA: Diagnosis not present

## 2017-07-10 DIAGNOSIS — K635 Polyp of colon: Secondary | ICD-10-CM | POA: Diagnosis not present

## 2017-07-10 DIAGNOSIS — D123 Benign neoplasm of transverse colon: Secondary | ICD-10-CM | POA: Insufficient documentation

## 2017-07-10 DIAGNOSIS — Z7984 Long term (current) use of oral hypoglycemic drugs: Secondary | ICD-10-CM | POA: Diagnosis not present

## 2017-07-10 DIAGNOSIS — Z800 Family history of malignant neoplasm of digestive organs: Secondary | ICD-10-CM | POA: Diagnosis not present

## 2017-07-10 DIAGNOSIS — D122 Benign neoplasm of ascending colon: Secondary | ICD-10-CM | POA: Insufficient documentation

## 2017-07-10 DIAGNOSIS — K5730 Diverticulosis of large intestine without perforation or abscess without bleeding: Secondary | ICD-10-CM | POA: Diagnosis not present

## 2017-07-10 HISTORY — PX: COLONOSCOPY WITH PROPOFOL: SHX5780

## 2017-07-10 LAB — GLUCOSE, CAPILLARY: Glucose-Capillary: 104 mg/dL — ABNORMAL HIGH (ref 65–99)

## 2017-07-10 SURGERY — COLONOSCOPY WITH PROPOFOL
Anesthesia: General

## 2017-07-10 MED ORDER — EPHEDRINE SULFATE 50 MG/ML IJ SOLN
INTRAMUSCULAR | Status: DC | PRN
  Administered 2017-07-10: 5 mg via INTRAVENOUS

## 2017-07-10 MED ORDER — PROPOFOL 500 MG/50ML IV EMUL
INTRAVENOUS | Status: DC | PRN
  Administered 2017-07-10: 120 ug/kg/min via INTRAVENOUS

## 2017-07-10 MED ORDER — MIDAZOLAM HCL 2 MG/2ML IJ SOLN
INTRAMUSCULAR | Status: AC
  Filled 2017-07-10: qty 2

## 2017-07-10 MED ORDER — FENTANYL CITRATE (PF) 100 MCG/2ML IJ SOLN
INTRAMUSCULAR | Status: AC
  Filled 2017-07-10: qty 2

## 2017-07-10 MED ORDER — MIDAZOLAM HCL 2 MG/2ML IJ SOLN
INTRAMUSCULAR | Status: DC | PRN
  Administered 2017-07-10: 2 mg via INTRAVENOUS

## 2017-07-10 MED ORDER — EPHEDRINE SULFATE 50 MG/ML IJ SOLN
INTRAMUSCULAR | Status: AC
  Filled 2017-07-10: qty 1

## 2017-07-10 MED ORDER — SODIUM CHLORIDE 0.9 % IV SOLN
INTRAVENOUS | Status: DC
  Administered 2017-07-10: 1000 mL via INTRAVENOUS

## 2017-07-10 MED ORDER — PROPOFOL 500 MG/50ML IV EMUL
INTRAVENOUS | Status: AC
  Filled 2017-07-10: qty 50

## 2017-07-10 NOTE — Anesthesia Postprocedure Evaluation (Signed)
Anesthesia Post Note  Patient: Morgan Hurst  Procedure(s) Performed: COLONOSCOPY WITH PROPOFOL (N/A )  Patient location during evaluation: Endoscopy Anesthesia Type: General Level of consciousness: awake and alert Pain management: pain level controlled Vital Signs Assessment: post-procedure vital signs reviewed and stable Respiratory status: spontaneous breathing, nonlabored ventilation, respiratory function stable and patient connected to nasal cannula oxygen Cardiovascular status: blood pressure returned to baseline and stable Postop Assessment: no apparent nausea or vomiting Anesthetic complications: no     Last Vitals:  Vitals:   07/10/17 0853 07/10/17 0903  BP: 117/67 117/69  Pulse: 79 75  Resp: 20 15  Temp:    SpO2: 97% 97%    Last Pain:  Vitals:   07/10/17 0903  TempSrc:   PainSc: 0-No pain                 Seanna Sisler S

## 2017-07-10 NOTE — Anesthesia Preprocedure Evaluation (Addendum)
Anesthesia Evaluation  Patient identified by MRN, date of birth, ID band Patient awake    Reviewed: Allergy & Precautions, NPO status , Patient's Chart, lab work & pertinent test results, reviewed documented beta blocker date and time   History of Anesthesia Complications (+) history of anesthetic complications  Airway Mallampati: III  TM Distance: >3 FB     Dental  (+) Chipped   Pulmonary           Cardiovascular      Neuro/Psych  Neuromuscular disease    GI/Hepatic GERD  ,  Endo/Other  diabetes, Type 2  Renal/GU      Musculoskeletal  (+) Arthritis ,   Abdominal   Peds  Hematology  (+) anemia ,   Anesthesia Other Findings Obese.  Reproductive/Obstetrics                            Anesthesia Physical Anesthesia Plan  ASA: III  Anesthesia Plan: General   Post-op Pain Management:    Induction: Intravenous  PONV Risk Score and Plan:   Airway Management Planned:   Additional Equipment:   Intra-op Plan:   Post-operative Plan:   Informed Consent: I have reviewed the patients History and Physical, chart, labs and discussed the procedure including the risks, benefits and alternatives for the proposed anesthesia with the patient or authorized representative who has indicated his/her understanding and acceptance.     Plan Discussed with: CRNA  Anesthesia Plan Comments:         Anesthesia Quick Evaluation

## 2017-07-10 NOTE — Transfer of Care (Signed)
   Immediate Anesthesia Transfer of Care Note  Patient: Morgan Hurst  Procedure(s) Performed: COLONOSCOPY WITH PROPOFOL (N/A )  Patient Location: PACU  Anesthesia Type:General  Level of Consciousness: awake and sedated  Airway & Oxygen Therapy: Patient Spontanous Breathing and Patient connected to face mask oxygen  Post-op Assessment: Report given to RN and Post -op Vital signs reviewed and stable  Post vital signs: Reviewed and stable  Last Vitals:  Vitals Value Taken Time  BP    Temp    Pulse    Resp    SpO2      Last Pain:  Vitals:   07/10/17 0714  TempSrc: Tympanic  PainSc: 0-No pain         Complications: No apparent anesthesia complications

## 2017-07-10 NOTE — Anesthesia Procedure Notes (Signed)
Performed by: Vaughan Sine Pre-anesthesia Checklist: Patient identified, Emergency Drugs available, Suction available, Patient being monitored and Timeout performed Patient Re-evaluated:Patient Re-evaluated prior to induction Oxygen Delivery Method: Simple face mask Preoxygenation: Pre-oxygenation with 100% oxygen Induction Type: IV induction Ventilation: Oral airway inserted - appropriate to patient size Airway Equipment and Method: Oral airway

## 2017-07-10 NOTE — Op Note (Signed)
[facility] Patient Name: Morgan Hurst Procedure Date: 07/10/2017 7:28 AM MRN: [id number] Account #: [id number] Date of Birth: 1945/03/08 Admit Type: Outpatient Age: 72 Room: [facility] ENDO ROOM 4 Gender: Female Note Status: Finalized Procedure:            Colonoscopy Indications:          Screening in patient at increased risk: Family history                        of 1st-degree relative with colorectal cancer Providers:            Jonathon Bellows MD, MD Referring MD:         Yvetta Coder. Arnett (Referring MD) Medicines:            Monitored Anesthesia Care Complications:        No immediate complications. Procedure:            Pre-Anesthesia Assessment:                       - Prior to the procedure, a History and Physical was                        performed, and patient medications, allergies and                        sensitivities were reviewed. The patient's tolerance of                        previous anesthesia was reviewed.                       - The risks and benefits of the procedure and the                        sedation options and risks were discussed with the                        patient. All questions were answered and informed                        consent was obtained.                       - ASA Grade Assessment: II - A patient with mild                        systemic disease.                       After obtaining informed consent, the colonoscope was                        passed under direct vision. Throughout the procedure,                        the patient's blood pressure, pulse, and oxygen                        saturations were monitored continuously. The  Colonoscope was introduced through the anus and                        advanced to the the cecum, identified by the                        appendiceal orifice, IC valve and transillumination.                        The colonoscopy was performed  with ease. The patient                        tolerated the procedure well. The quality of the bowel                        preparation was good. Findings:      The perianal and digital rectal examinations were normal.      Multiple small-mouthed diverticula were found in the entire colon.      A 5 mm polyp was found in the ascending colon. The polyp was sessile.       The polyp was removed with a cold snare. Resection and retrieval were       complete.      The exam was otherwise without abnormality on direct and retroflexion       views.      A 3 mm polyp was found in the sigmoid colon. The polyp was sessile. The       polyp was removed with a cold biopsy forceps. Resection and retrieval       were complete. Impression:           - Diverticulosis in the entire examined colon.                       - One 5 mm polyp in the ascending colon, removed with a                        cold snare. Resected and retrieved.                       - The examination was otherwise normal on direct and                        retroflexion views. Recommendation:       - Discharge patient to home (with escort).                       - Resume previous diet.                       - Continue present medications.                       - Await pathology results.                       - Repeat colonoscopy in 5 years for surveillance. Procedure Code(s):    --- Professional ---                       [phone], Colonoscopy, flexible; with removal of tumor(s),  polyp(s), or other lesion(s) by snare technique                       [id number], 59, Colonoscopy, flexible; with biopsy, single                        or multiple Diagnosis Code(s):    --- Professional ---                       Z80.0, Family history of malignant neoplasm of                        digestive organs                       D12.2, Benign neoplasm of ascending colon                       K57.30, Diverticulosis of large intestine  without                        perforation or abscess without bleeding CPT copyright 2017 American Medical Association. All rights reserved. The codes documented in this report are preliminary and upon coder review may  be revised to meet current compliance requirements. Jonathon Bellows, MD Jonathon Bellows MD, MD 07/10/2017 8:30:51 AM This report has been signed electronically. Number of Addenda: 0 Note Initiated On: 07/10/2017 7:28 AM Scope Withdrawal Time: 0 hours 16 minutes 9 seconds  Total Procedure Duration: 0 hours 18 minutes 59 seconds       [facility]

## 2017-07-10 NOTE — H&P (Signed)
Jonathon Bellows, MD [address] [place], [place], [place], [address] [address], [place], [place], [place], [address] Phone: [phone]  Fax: [phone]  Primary Care Physician:  Burnard Hawthorne, FNP   Pre-Procedure History & Physical: HPI:  Morgan Hurst is a 72 y.o. female is here for an colonoscopy.   Past Medical History:  Diagnosis Date  . Arthritis    lower back, right hip  . Borderline diabetes    PCP STARTED PT ON METFORMIN IN 2016 DUE TO ELEVATED GLUCOSE   . Complication of anesthesia    FIGHTING WHEN WAKING UP FROM ANESTHESIA  . Diabetes mellitus without complication ([place])   . Endometriosis   . Heart murmur   . Orthodontics    top front 3 teeth caps and bridge    Past Surgical History:  Procedure Laterality Date  . ABDOMINAL HYSTERECTOMY  age 54  . ANKLE SURGERY    . CHOLECYSTECTOMY N/A 04/10/2015   Procedure: LAPAROSCOPIC CHOLECYSTECTOMY WITH INTRAOPERATIVE CHOLANGIOGRAM;  Surgeon: Robert Bellow, MD;  Location: [facility] ORS;  Service: General;  Laterality: N/A;  . ESOPHAGOGASTRODUODENOSCOPY N/A 05/12/2015   Procedure: ESOPHAGOGASTRODUODENOSCOPY (EGD);  Surgeon: Hulen Luster, MD;  Location: [place];  Service: Gastroenterology;  Laterality: N/A;  . VAGINAL DELIVERY  2    Prior to Admission medications   Medication Sig Start Date End Date Taking? Authorizing Provider  acetaminophen (TYLENOL) 325 MG tablet Take by mouth.   Yes [provider]  aspirin 81 MG tablet Take 81 mg by mouth daily.   Yes [provider]  benzonatate (TESSALON) 100 MG capsule Take 1 capsule (100 mg total) by mouth 3 (three) times daily as needed. 03/24/17  Yes Marrian Salvage, FNP  CALCIUM PO Take by mouth.   Yes [provider]  celecoxib (CELEBREX) 200 MG capsule Take 1 capsule (200 mg total) by mouth 2 (two) times daily. 05/24/16  Yes Burnard Hawthorne, FNP  Cholecalciferol (VITAMIN D3) 2000 units capsule Take by mouth.   Yes [provider]  colesevelam (WELCHOL) 625 MG tablet Take 625 mg by mouth daily.   Yes [provider]  cyanocobalamin (,VITAMIN B-12,) 1000 MCG/ML injection Inject into the muscle.   Yes [provider]  esomeprazole (NEXIUM) 40 MG capsule Take by mouth. 05/21/15  Yes [provider]  metFORMIN (GLUCOPHAGE) 500 MG tablet Take 500 mg by mouth daily.   Yes [provider]  mometasone (ELOCON) 0.1 % lotion APPLY 4  DROPS  TO EACH EAR CANAL HS ONCE PER WEEK AND PRF ITCHING AND DRY SKIN 01/20/17  Yes [provider]  ondansetron (ZOFRAN) 4 MG tablet Take 1 tablet (4 mg total) by mouth every 8 (eight) hours as needed for nausea or vomiting. 03/24/17  Yes Marrian Salvage, FNP  rOPINIRole (REQUIP) 0.25 MG tablet TAKE 1 TABLET(0.25 MG) BY MOUTH AT BEDTIME AS NEEDED 12/23/16  Yes Burnard Hawthorne, FNP  traZODone (DESYREL) 50 MG tablet Take 1 tablet (50 mg total) by mouth at bedtime. 05/24/16  Yes Arnett, Yvetta Coder, FNP  levofloxacin (LEVAQUIN) 500 MG tablet Take 1 tablet (500 mg total) by mouth daily. Patient not taking: Reported on 07/10/2017 03/24/17   Marrian Salvage, FNP  loperamide (IMODIUM A-D) 2 MG tablet Take 1 tablet (2 mg total) by mouth 4 (four) times daily as needed for diarrhea or loose stools. Patient not taking: Reported on 07/10/2017 06/09/17   Harvest Dark, MD  ondansetron (ZOFRAN ODT) 4 MG  disintegrating tablet Take 1 tablet (4 mg total) by mouth every 8 (eight) hours as needed for nausea or vomiting. 06/09/17   Harvest Dark, MD  rosuvastatin (CRESTOR) 20 MG tablet Take 1 tablet (20 mg total) by mouth daily. Patient not taking: Reported on 07/10/2017 05/13/17   Burnard Hawthorne, FNP  traMADol (ULTRAM) 50 MG tablet TAKE 1 TABLET BY MOUTH DAILY AS NEEDED 04/21/17   Burnard Hawthorne, FNP    Allergies as of 04/07/2017  . (No Known Allergies)    Family History  Problem Relation Age of Onset  . Diabetes Mother   . Cancer Mother          colon and breast  . Cancer Father        Leukemia  . Cancer Brother        colon and lung  . Cholelithiasis Daughter   . Heart disease Paternal Grandfather   . Cancer Other 29       colon    Social History   Socioeconomic History  . Marital status: Widowed    Spouse name: Not on file  . Number of children: Not on file  . Years of education: Not on file  . Highest education level: Not on file  Occupational History  . Not on file  Social Needs  . Financial resource strain: Not on file  . Food insecurity:    Worry: Not on file    Inability: Not on file  . Transportation needs:    Medical: Not on file    Non-medical: Not on file  Tobacco Use  . Smoking status: Never Smoker  . Smokeless tobacco: Never Used  Substance and Sexual Activity  . Alcohol use: No  . Drug use: No  . Sexual activity: Never  Lifestyle  . Physical activity:    Days per week: Not on file    Minutes per session: Not on file  . Stress: Not on file  Relationships  . Social connections:    Talks on phone: Not on file    Gets together: Not on file    Attends religious service: Not on file    Active member of club or organization: Not on file    Attends meetings of clubs or organizations: Not on file    Relationship status: Not on file  . Intimate partner violence:    Fear of current or ex partner: Not on file    Emotionally abused: Not on file    Physically abused: Not on file    Forced sexual activity: Not on file  Other Topics Concern  . Not on file  Social History Narrative   Lives in [place].       Husband passed away 2014-04-02 from PNA.      Work - retired from [employer] - regular; working on [employer]   Exercise - walks occasionally, limited by fatigue    Review of Systems: See HPI, otherwise negative ROS  Physical Exam: BP 137/78   Pulse 77   Temp (!) 96.8 F (36 C) (Tympanic)   Resp 18   Ht 5' 1.5" (1.562 m)   Wt 220 lb (99.8 kg)   SpO2 97%   BMI  40.90 kg/m  General:   Alert,  pleasant and cooperative in NAD Head:  Normocephalic and atraumatic. Neck:  Supple; no masses or thyromegaly. Lungs:  Clear throughout to auscultation, normal respiratory effort.    Heart:  +S1, +S2,  Regular rate and rhythm, No edema. Abdomen:  Soft, nontender and nondistended. Normal bowel sounds, without guarding, and without rebound.   Neurologic:  Alert and  oriented x4;  grossly normal neurologically.  Impression/Plan: Morgan Hurst is here for an colonoscopy to be performed for surveillance due to family  history of colon cancer  Risks, benefits, limitations, and alternatives regarding  colonoscopy have been reviewed with the patient.  Questions have been answered.  All parties agreeable.   Jonathon Bellows, MD  07/10/2017, 8:00 AM

## 2017-07-10 NOTE — Anesthesia Post-op Follow-up Note (Signed)
Anesthesia QCDR form completed.        

## 2017-07-11 LAB — SURGICAL PATHOLOGY

## 2017-07-11 NOTE — Telephone Encounter (Signed)
Appointment scheduled.

## 2017-07-13 ENCOUNTER — Ambulatory Visit (INDEPENDENT_AMBULATORY_CARE_PROVIDER_SITE_OTHER): Payer: Medicare Other | Admitting: *Deleted

## 2017-07-13 ENCOUNTER — Encounter: Payer: Self-pay | Admitting: Gastroenterology

## 2017-07-13 DIAGNOSIS — E538 Deficiency of other specified B group vitamins: Secondary | ICD-10-CM | POA: Diagnosis not present

## 2017-07-13 MED ORDER — CYANOCOBALAMIN 1000 MCG/ML IJ SOLN
1000.0000 ug | Freq: Once | INTRAMUSCULAR | Status: AC
  Administered 2017-07-13: 1000 ug via INTRAMUSCULAR

## 2017-07-13 NOTE — Progress Notes (Signed)
Patient presented for B 12 injection to left deltoid, patient voiced no concerns nor showed any signs of distress during injection. 

## 2017-07-14 ENCOUNTER — Encounter: Payer: Self-pay | Admitting: Gastroenterology

## 2017-07-16 NOTE — Progress Notes (Signed)
  I have reviewed the above information and agree with above.   Teddi Badalamenti, MD 

## 2017-08-07 ENCOUNTER — Other Ambulatory Visit: Payer: Self-pay | Admitting: Family

## 2017-08-07 DIAGNOSIS — M48062 Spinal stenosis, lumbar region with neurogenic claudication: Secondary | ICD-10-CM

## 2017-08-08 NOTE — Telephone Encounter (Signed)
Last office visit 03/17/17 No office visit scheduled , [facility] with  Request 90 day supply

## 2017-08-09 MED ORDER — CELECOXIB 100 MG PO CAPS: 100.0000 mg | ORAL_CAPSULE | Freq: Two times a day (BID) | ORAL | 1 refills | Status: DC | PRN

## 2017-08-09 NOTE — Telephone Encounter (Signed)
[facility] [place]

## 2017-08-09 NOTE — Telephone Encounter (Signed)
Patient is ok with taking lower dose of celebrex 147m bid . She states takes on prn basis.

## 2017-08-09 NOTE — Telephone Encounter (Signed)
Call pt Happy to refill celebrex for 90 days  I wanted to ask if she had considered lower dose 161m BID as oppsed to 2041mI typically advise lowest dose on this medication if effective due to risk of cardiovascular events ( MI, CVA)  on this medication ( it carries what we call ' a black box warning.')

## 2017-08-14 ENCOUNTER — Ambulatory Visit: Payer: Medicare Other

## 2017-08-16 ENCOUNTER — Ambulatory Visit: Payer: Medicare Other

## 2017-08-22 ENCOUNTER — Ambulatory Visit: Payer: Medicare Other

## 2017-08-22 ENCOUNTER — Ambulatory Visit (INDEPENDENT_AMBULATORY_CARE_PROVIDER_SITE_OTHER): Payer: Medicare Other

## 2017-08-22 VITALS — BP 122/64 | HR 77 | Temp 98.0°F | Resp 16 | Ht 61.5 in | Wt 225.4 lb

## 2017-08-22 DIAGNOSIS — Z0000 Encounter for general adult medical examination without abnormal findings: Secondary | ICD-10-CM

## 2017-08-22 DIAGNOSIS — E538 Deficiency of other specified B group vitamins: Secondary | ICD-10-CM

## 2017-08-22 MED ORDER — CYANOCOBALAMIN 1000 MCG/ML IJ SOLN
1000.0000 ug | Freq: Once | INTRAMUSCULAR | Status: AC
  Administered 2017-08-22: 1000 ug via INTRAMUSCULAR

## 2017-08-22 NOTE — Progress Notes (Signed)
Subjective:   Morgan Hurst is a 72 y.o. female who presents for Medicare Annual (Subsequent) preventive examination.  Review of Systems:  No ROS.  Medicare Wellness Visit. Additional risk factors are reflected in the social history.  Cardiac Risk Factors include: advanced age (>31mn, >>70women);obesity (BMI >30kg/m2)     Objective:     Vitals: BP 122/64 (BP Location: Right Arm, Patient Position: Sitting, Cuff Size: Normal)   Pulse 77   Temp 98 F (36.7 C) (Oral)   Resp 16   Ht 5' 1.5" (1.562 m)   Wt 225 lb 6.4 oz (102.2 kg)   SpO2 97%   BMI 41.90 kg/m   Body mass index is 41.9 kg/m.  Advanced Directives 08/22/2017 07/10/2017 08/11/2016 08/12/2015 05/12/2015  Does Patient Have a Medical Advance Directive? Yes Yes Yes Yes Yes  Type of [place]aramed[place]of [place] - [place] - Living will;Healthcare Power of Attorney  Does patient want to make changes to medical advance directive? No - Patient declined - No - Patient declined - -  Copy of HSimpsonin Chart? No - copy requested - No - copy requested No - copy requested No - copy requested    Tobacco Social History   Tobacco Use  Smoking Status Never Smoker  Smokeless Tobacco Never Used     Counseling given: Not Answered   Clinical Intake:  Pre-visit preparation completed: Yes  Pain : No/denies pain     Nutritional Status: BMI > 30  Obese Diabetes: No  How often do you need to have someone help you when you read instructions, pamphlets, or other written materials from your doctor or pharmacy?: 1 - Never  Interpreter Needed?: No     Past Medical History:  Diagnosis Date  . Arthritis    lower back, right hip  . Borderline diabetes    PCP STARTED PT ON METFORMIN IN 2016 DUE TO ELEVATED GLUCOSE   . Complication of anesthesia    FIGHTING WHEN WAKING UP FROM ANESTHESIA  . [place]   .  Endometriosis   . Heart murmur   . Orthodontics    top front 3 teeth caps and bridge   Past Surgical History:  Procedure Laterality Date  . ABDOMINAL HYSTERECTOMY  age 72 . ANKLE SURGERY    . CHOLECYSTECTOMY N/A 04/10/2015   Procedure: LAPAROSCOPIC CHOLECYSTECTOMY WITH INTRAOPERATIVE CHOLANGIOGRAM;  Surgeon: JRobert Bellow MD;  Location: [facility] ORS;  Service: General;  Laterality: N/A;  . COLONOSCOPY WITH PROPOFOL N/A 07/10/2017   Procedure: COLONOSCOPY WITH PROPOFOL;  Surgeon: AJonathon Bellows MD;  [facility];  Service: Gastroenterology;  Laterality: N/A;  . ESOPHAGOGASTRODUODENOSCOPY N/A 05/12/2015   Procedure: ESOPHAGOGASTRODUODENOSCOPY (EGD);  Surgeon: PHulen Luster MD;  [place]  Service: Gastroenterology;  Laterality: N/A;  . VAGINAL DELIVERY  2   Family History  Problem Relation Age of Onset  . Diabetes Mother   . Cancer Mother        colon and breast  . Cancer Father        Leukemia  . Cancer Brother        colon and lung  . Cholelithiasis Daughter   . Heart disease Paternal Grandfather   . Cancer Other 29       colon   Social History   Socioeconomic History  . Marital status: Widowed    Spouse name: Not on file  . Number  of children: Not on file  . Years of education: Not on file  . Highest education level: Not on file  Occupational History  . Not on file  Social Needs  . Financial resource strain: Not hard at all  . Food insecurity:    Worry: Never true    Inability: Never true  . Transportation needs:    Medical: No    Non-medical: No  Tobacco Use  . Smoking status: Never Smoker  . Smokeless tobacco: Never Used  Substance and Sexual Activity  . Alcohol use: No  . Drug use: No  . Sexual activity: Never  Lifestyle  . Physical activity:    Days per week: 0 days    Minutes per session: 0 min  . Stress: Not at all  Relationships  . Social connections:    Talks on phone: Not on file    Gets together: Not on file     Attends religious service: Not on file    Active member of club or organization: Not on file    Attends meetings of clubs or organizations: Not on file    Relationship status: Not on file  Other Topics Concern  . Not on file  Social History Narrative   Lives in [place].       Husband passed away March 15, 2014 from PNA.      Work - retired from [employer] - regular; working on [employer]   Exercise - walks occasionally, limited by fatigue. [place] and takes care of her 64 year old grandchild.     Outpatient Encounter Medications as of 08/22/2017  Medication Sig  . acetaminophen (TYLENOL) 325 MG tablet Take by mouth.  Marland Kitchen aspirin 81 MG tablet Take 81 mg by mouth daily.  Marland Kitchen CALCIUM PO Take by mouth.  . celecoxib (CELEBREX) 100 MG capsule Take 1 capsule (100 mg total) by mouth 2 (two) times daily as needed.  . Cholecalciferol (VITAMIN D3) 2000 units capsule Take by mouth.  . colesevelam (WELCHOL) 625 MG tablet Take 625 mg by mouth daily.  . cyanocobalamin (,VITAMIN B-12,) 1000 MCG/ML injection Inject into the muscle.  . esomeprazole (NEXIUM) 40 MG capsule Take by mouth.  . metFORMIN (GLUCOPHAGE) 500 MG tablet Take 500 mg by mouth daily.  . mometasone (ELOCON) 0.1 % lotion APPLY 4  DROPS  TO EACH EAR CANAL HS ONCE PER WEEK AND PRF ITCHING AND DRY SKIN  . ondansetron (ZOFRAN ODT) 4 MG disintegrating tablet Take 1 tablet (4 mg total) by mouth every 8 (eight) hours as needed for nausea or vomiting.  . ondansetron (ZOFRAN) 4 MG tablet Take 1 tablet (4 mg total) by mouth every 8 (eight) hours as needed for nausea or vomiting.  Marland Kitchen rOPINIRole (REQUIP) 0.25 MG tablet TAKE 1 TABLET(0.25 MG) BY MOUTH AT BEDTIME AS NEEDED  . traMADol (ULTRAM) 50 MG tablet TAKE 1 TABLET BY MOUTH DAILY AS NEEDED  . traZODone (DESYREL) 50 MG tablet Take 1 tablet (50 mg total) by mouth at bedtime. (Patient taking differently: Take 50 mg by mouth at bedtime. )  . [DISCONTINUED] benzonatate (TESSALON) 100 MG capsule  Take 1 capsule (100 mg total) by mouth 3 (three) times daily as needed.  . [DISCONTINUED] levofloxacin (LEVAQUIN) 500 MG tablet Take 1 tablet (500 mg total) by mouth daily.  . [DISCONTINUED] loperamide (IMODIUM A-D) 2 MG tablet Take 1 tablet (2 mg total) by mouth 4 (four) times daily as needed for diarrhea or loose stools.  . [  DISCONTINUED] rosuvastatin (CRESTOR) 20 MG tablet Take 1 tablet (20 mg total) by mouth daily.  . [EXPIRED] cyanocobalamin ((VITAMIN B-12)) injection 1,000 mcg    No facility-administered encounter medications on file as of 08/22/2017.     Activities of Daily Living In your present state of health, do you have any difficulty performing the following activities: 08/22/2017  Hearing? N  Vision? N  Difficulty concentrating or making decisions? N  Walking or climbing stairs? N  Dressing or bathing? N  Doing errands, shopping? N  Preparing Food and eating ? N  Using the Toilet? N  In the past six months, have you accidently leaked urine? N  Do you have problems with loss of bowel control? N  Managing your Medications? N  Managing your Finances? N  Housekeeping or managing your Housekeeping? N  Some recent data might be hidden    Patient Care Team: Burnard Hawthorne, FNP as PCP - General (Family Medicine) Jackolyn Confer, MD (Internal Medicine) Bary Castilla Forest Gleason, MD (General Surgery)    Assessment:   This is a routine wellness examination for Tiyah.  The goal of the wellness visit is to assist the patient how to close the gaps in care and create a preventative care plan for the patient.   The roster of all physicians providing medical care to patient is listed in the Snapshot section of the chart.  Taking calcium VIT D as appropriate/Osteoporosis risk reviewed.    Safety issues reviewed; Smoke and carbon monoxide detectors in the home. No firearms in the home. Wears seatbelts when driving or riding with others. No violence in the home.  They do not have  excessive sun exposure.  Discussed the need for sun protection: hats, long sleeves and the use of sunscreen if there is significant sun exposure.  Patient is alert, normal appearance, oriented to person/place/and time. Correctly identified the president of the [place] and recalls of 3/3 words.Performs simple calculations and can read correct time from watch face. Displays appropriate judgement.  No new identified risk were noted.  No failures at ADL's or IADL's.    BMI- discussed the importance of a healthy diet, water intake and the benefits of aerobic exercise. Educational material provided.   24 hour diet recall: Regular diet  Dental- UTD.  Sleep patterns- Sleeps without issues.   Medication- taking all medication as directed.   Per pcp order, vitamin B12 administered R deltoid, tolerated well. No verbal complaint or signs of distress with administration.   Patient Concerns: None at this time. Follow up with PCP as needed.  Exercise Activities and Dietary recommendations Current Exercise Habits: Home exercise routine, Time (Minutes): 20, Frequency (Times/Week): 4, Weekly Exercise (Minutes/Week): 80, Intensity: Mild  Goals    . Weight 200lbs     Walk for exercise 150 minutes per week (5 days, 30 minutes) Low cholesterol diet Strength training/ weights        Fall Risk Fall Risk  08/22/2017 10/13/2016 08/11/2016 05/24/2016 12/31/2015  Falls in the past year? No No Yes No No  Depression Screen PHQ 2/9 Scores 08/22/2017 10/13/2016 08/11/2016 05/24/2016  PHQ - 2 Score 0 0 0 0     Cognitive Function MMSE - Mini Mental State Exam 08/22/2017 08/11/2016 08/12/2015  Orientation to time 5 5 5   Orientation to Place 5 5 5   Registration 3 3 3   Attention/ Calculation 5 5 5   Recall 3 3 3   Language- name 2 objects 2 2 2   Language- repeat 1 1 1  Language- follow 3 step command 3 3 3   Language- read & follow direction 1 1 1   Write a sentence 1 1 1   Copy design 1 1 1   Total score 30 30 30           Immunization History  Administered Date(s) Administered  . Influenza Split 12/26/2011  . Influenza, High Dose Seasonal PF 11/25/2015, 10/13/2016  . Influenza,inj,Quad PF,6+ Mos 12/25/2012, 03/18/2014, 10/07/2014  . Pneumococcal Conjugate-13 03/18/2014  . Pneumococcal Polysaccharide-23 12/26/2011  . Tdap 12/25/2009  . Zoster 12/26/2010   Screening Tests Health Maintenance  Topic Date Due  . FOOT EXAM  06/05/1955  . MAMMOGRAM  06/23/2015  . OPHTHALMOLOGY EXAM  08/18/2015  . URINE MICROALBUMIN  02/21/2017  . INFLUENZA VACCINE  08/17/2017  . HEMOGLOBIN A1C  10/12/2017  . TETANUS/TDAP  12/26/2019  . COLONOSCOPY  07/11/2022  . DEXA SCAN  Completed  . Hepatitis C Screening  Completed  . PNA vac Low Risk Adult  Completed      Plan:   End of life planning; Advance aging; Advanced directives discussed. Copy of current HCPOA/Living Will requested.    I have personally reviewed and noted the following in the patient's chart:   . Medical and social history . Use of alcohol, tobacco or illicit drugs  . Current medications and supplements . Functional ability and status . Nutritional status . Physical activity . Advanced directives . List of other physicians . Hospitalizations, surgeries, and ER visits in previous 12 months . Vitals . Screenings to include cognitive, depression, and falls . Referrals and appointments  In addition, I have reviewed and discussed with patient certain preventive protocols, quality metrics, and best practice recommendations. A written personalized care plan for preventive services as well as general preventive health recommendations were provided to patient.     OBrien-Blaney, Denisa L, LPN  0/02/3341   Reviewed above information.  Agree with assessment and plan.    Dr Nicki Reaper

## 2017-08-22 NOTE — Patient Instructions (Addendum)
  Morgan Hurst , Thank you for taking time to come for your Medicare Wellness Visit. I appreciate your ongoing commitment to your health goals. Please review the following plan we discussed and let me know if I can assist you in the future.   Follow up as needed.    Return next week for scheduled labs.  Bring a copy of your [place] and/or Living Will to be scanned into chart.  Have a great day!These are the goals we discussed: Goals    . Weight 200lbs     Walk for exercise 150 minutes per week (5 days, 30 minutes) Low cholesterol diet Strength training/ weights        This is a list of the screening recommended for you and due dates:  Health Maintenance  Topic Date Due  . Complete foot exam   06/05/1955  . Mammogram  06/23/2015  . Eye exam for diabetics  08/18/2015  . Urine Protein Check  02/21/2017  . Flu Shot  08/17/2017  . Hemoglobin A1C  10/12/2017  . Tetanus Vaccine  12/26/2019  . Colon Cancer Screening  07/11/2022  . DEXA scan (bone density measurement)  Completed  .  Hepatitis C: One time screening is recommended by Center for Disease Control  (CDC) for  adults born from [birth year] through 1964.   Completed  . Pneumonia vaccines  Completed

## 2017-08-30 ENCOUNTER — Telehealth: Payer: Self-pay | Admitting: *Deleted

## 2017-08-30 DIAGNOSIS — Z0000 Encounter for general adult medical examination without abnormal findings: Secondary | ICD-10-CM

## 2017-08-30 NOTE — Telephone Encounter (Signed)
Ordered

## 2017-08-30 NOTE — Telephone Encounter (Signed)
Please place future orders for lab appt on 08/31/17.

## 2017-08-31 ENCOUNTER — Other Ambulatory Visit (INDEPENDENT_AMBULATORY_CARE_PROVIDER_SITE_OTHER): Payer: Medicare Other

## 2017-08-31 DIAGNOSIS — Z0000 Encounter for general adult medical examination without abnormal findings: Secondary | ICD-10-CM

## 2017-08-31 LAB — VITAMIN D 25 HYDROXY (VIT D DEFICIENCY, FRACTURES): VITD: 24.25 ng/mL — ABNORMAL LOW (ref 30.00–100.00)

## 2017-08-31 LAB — COMPREHENSIVE METABOLIC PANEL
ALT: 24 U/L (ref 0–35)
AST: 27 U/L (ref 0–37)
Albumin: 4 g/dL (ref 3.5–5.2)
Alkaline Phosphatase: 70 U/L (ref 39–117)
BUN: 13 mg/dL (ref 6–23)
CHLORIDE: 104 meq/L (ref 96–112)
CO2: 28 meq/L (ref 19–32)
CREATININE: 0.68 mg/dL (ref 0.40–1.20)
Calcium: 9.7 mg/dL (ref 8.4–10.5)
GFR: 90.59 mL/min (ref >60.00)
GLUCOSE: 113 mg/dL — AB (ref 70–99)
Potassium: 3.7 mEq/L (ref 3.5–5.1)
SODIUM: 141 meq/L (ref 135–145)
Total Bilirubin: 0.6 mg/dL (ref 0.2–1.2)
Total Protein: 7.5 g/dL (ref 6.0–8.3)

## 2017-08-31 LAB — LIPID PANEL
Cholesterol: 194 mg/dL (ref 0–200)
HDL: 49.6 mg/dL (ref >39.00)
LDL CALC: 111 mg/dL — AB (ref 0–99)
NonHDL: 144.14
Total CHOL/HDL Ratio: 4
Triglycerides: 165 mg/dL — ABNORMAL HIGH (ref 0.0–149.0)
VLDL: 33 mg/dL (ref 0.0–40.0)

## 2017-09-14 ENCOUNTER — Telehealth: Payer: Self-pay | Admitting: Family

## 2017-09-14 NOTE — Telephone Encounter (Signed)
Patient returning call to [facility]. Please advise.

## 2017-09-14 NOTE — Telephone Encounter (Signed)
Charted in result notes.

## 2017-09-14 NOTE — Telephone Encounter (Signed)
Pt called back to get results  Copied from [place] [id number]. Topic: Quick Communication - Lab Results >> Sep 14, 2017  3:16 PM Bare, Patriciaann Clan, CMA wrote: Called patient to inform them of  lab results. When patient returns call, triage nurse may disclose results.

## 2017-09-15 NOTE — Telephone Encounter (Signed)
Patient returned call

## 2017-09-26 ENCOUNTER — Ambulatory Visit (INDEPENDENT_AMBULATORY_CARE_PROVIDER_SITE_OTHER): Payer: Medicare Other

## 2017-09-26 DIAGNOSIS — Z23 Encounter for immunization: Secondary | ICD-10-CM | POA: Diagnosis not present

## 2017-09-26 DIAGNOSIS — E538 Deficiency of other specified B group vitamins: Secondary | ICD-10-CM

## 2017-09-26 MED ORDER — CYANOCOBALAMIN 1000 MCG/ML IJ SOLN
1000.0000 ug | Freq: Once | INTRAMUSCULAR | Status: AC
  Administered 2017-09-26: 1000 ug via INTRAMUSCULAR

## 2017-09-26 NOTE — Telephone Encounter (Signed)
Spoke with patient. See regarding cholesterol . She states she is unable to tolerate cholesterol medication due to myalgias.   She is taking OTC vitamin D. She will send mychart message and let you know how much she is taking.

## 2017-09-26 NOTE — Progress Notes (Signed)
Patient comes in for B 12 injection.  Injected right deltoid.  Patient tolerated injection well.

## 2017-09-27 ENCOUNTER — Encounter: Payer: Self-pay | Admitting: Family

## 2017-09-27 NOTE — Telephone Encounter (Signed)
Close Sent mychart

## 2017-09-28 NOTE — Progress Notes (Signed)
  I have reviewed the above information and agree with above.   Teresa Tullo, MD 

## 2017-09-29 ENCOUNTER — Encounter: Payer: Self-pay | Admitting: Family

## 2017-10-31 ENCOUNTER — Ambulatory Visit (INDEPENDENT_AMBULATORY_CARE_PROVIDER_SITE_OTHER): Payer: Medicare Other

## 2017-10-31 ENCOUNTER — Other Ambulatory Visit: Payer: Self-pay | Admitting: Family

## 2017-10-31 DIAGNOSIS — M48062 Spinal stenosis, lumbar region with neurogenic claudication: Secondary | ICD-10-CM

## 2017-10-31 DIAGNOSIS — E538 Deficiency of other specified B group vitamins: Secondary | ICD-10-CM

## 2017-10-31 DIAGNOSIS — R252 Cramp and spasm: Secondary | ICD-10-CM

## 2017-10-31 MED ORDER — CYANOCOBALAMIN 1000 MCG/ML IJ SOLN
1000.0000 ug | Freq: Once | INTRAMUSCULAR | Status: AC
  Administered 2017-10-31: 1000 ug via INTRAMUSCULAR

## 2017-10-31 NOTE — Progress Notes (Signed)
Pt was here today for B-12 shot. Shot given in LD pt tolerated well.

## 2017-11-01 ENCOUNTER — Encounter: Payer: Self-pay | Admitting: Family

## 2017-11-01 NOTE — Telephone Encounter (Signed)
Requip last refill 12/23/16 Ultram last refill 07/04/17 Last office visit 03/17/17 Next office visit 08/24/18

## 2017-11-01 NOTE — Telephone Encounter (Signed)
Call pt  I refilled tramadol; last OV 03/2017.     She is overdue for an appt with me Please education patient that tramadol is  a controlled substance;  In order for me to prescribe medication,  Patient must be seen every 3-6 months. please make follow-up appointment.  Please also confirm that she is using requip for restless legs; I do not see where we have discussed in the past.   I looked up patient on [place] Controlled Substances Reporting System and saw no activity that raised concern of inappropriate use.   Marland Kitchen

## 2017-11-12 NOTE — Progress Notes (Signed)
[facility] [place], [place] [address] Phone: [phone] Subjective:   I, Kandace Blitz, am serving as a scribe for Dr. Hulan Saas.  I'm seeing this patient by the request  of:    CC: left shoulder pain   TOI:[id number]  Morgan Hurst is a 72 y.o. female coming in with complaint of left shoulder pain. Wants an injection today.  Patient does have left shoulder pain.  Has been going on weeks.  Worsening.  Waking her up at night.  Has seen patient previously for this greater than 3 years ago.  Patient does not remember this.  Patient states seems to be losing some range of motion as well.  Does not remember any injury.     Past Medical History:  Diagnosis Date  . Arthritis    lower back, right hip  . Borderline diabetes    PCP STARTED PT ON METFORMIN IN 20-Mar-2014 DUE TO ELEVATED GLUCOSE   . Complication of anesthesia    FIGHTING WHEN WAKING UP FROM ANESTHESIA  . Diabetes mellitus without complication ([place])   . Endometriosis   . Heart murmur   . Orthodontics    top front 3 teeth caps and bridge   Past Surgical History:  Procedure Laterality Date  . ABDOMINAL HYSTERECTOMY  age 68  . ANKLE SURGERY    . CHOLECYSTECTOMY N/A 04/10/2015   Procedure: LAPAROSCOPIC CHOLECYSTECTOMY WITH INTRAOPERATIVE CHOLANGIOGRAM;  Surgeon: Robert Bellow, MD;  Location: [facility] ORS;  Service: General;  Laterality: N/A;  . COLONOSCOPY WITH PROPOFOL N/A 07/10/2017   Procedure: COLONOSCOPY WITH PROPOFOL;  Surgeon: Jonathon Bellows, MD;  Location: [facility] ENDOSCOPY;  Service: Gastroenterology;  Laterality: N/A;  . ESOPHAGOGASTRODUODENOSCOPY N/A 05/12/2015   Procedure: ESOPHAGOGASTRODUODENOSCOPY (EGD);  Surgeon: Hulen Luster, MD;  Location: [facility];  Service: Gastroenterology;  Laterality: N/A;  . VAGINAL DELIVERY  2   Social History   Socioeconomic History  . Marital status: Widowed    Spouse name: Not on file  . Number of children: Not on file  . Years of  education: Not on file  . Highest education level: Not on file  Occupational History  . Not on file  Social Needs  . Financial resource strain: Not hard at all  . Food insecurity:    Worry: Never true    Inability: Never true  . Transportation needs:    Medical: No    Non-medical: No  Tobacco Use  . Smoking status: Never Smoker  . Smokeless tobacco: Never Used  Substance and Sexual Activity  . Alcohol use: No  . Drug use: No  . Sexual activity: Never  Lifestyle  . Physical activity:    Days per week: 0 days    Minutes per session: 0 min  . Stress: Not at all  Relationships  . Social connections:    Talks on phone: Not on file    Gets together: Not on file    Attends religious service: Not on file    Active member of club or organization: Not on file    Attends meetings of clubs or organizations: Not on file    Relationship status: Not on file  Other Topics Concern  . Not on file  Social History Narrative   Lives in [place].       Husband passed away 03-20-14 from PNA.      Work - retired from [employer] - regular; working  on weight   Exercise - walks occasionally, limited by fatigue. [place] and takes care of her 81 year old grandchild.    No Known Allergies Family History  Problem Relation Age of Onset  . Diabetes Mother   . Cancer Mother        colon and breast  . Cancer Father        Leukemia  . Cancer Brother        colon and lung  . Cholelithiasis Daughter   . Heart disease Paternal Grandfather   . Cancer Other 29       colon    Current Outpatient Medications (Endocrine & Metabolic):  .  metFORMIN (GLUCOPHAGE) 500 MG tablet, Take 500 mg by mouth daily.  Current Outpatient Medications (Cardiovascular):  .  colesevelam (WELCHOL) 625 MG tablet, Take 625 mg by mouth daily.   Current Outpatient Medications (Analgesics):  .  acetaminophen (TYLENOL) 325 MG tablet, Take by mouth. Marland Kitchen  aspirin 81 MG tablet, Take 81 mg by mouth daily. .   celecoxib (CELEBREX) 100 MG capsule, Take 1 capsule (100 mg total) by mouth 2 (two) times daily as needed. .  traMADol (ULTRAM) 50 MG tablet, TAKE 1 TABLET BY MOUTH DAILY AS NEEDED  Current Outpatient Medications (Hematological):  .  cyanocobalamin (,VITAMIN B-12,) 1000 MCG/ML injection, Inject into the muscle.  Current Outpatient Medications (Other):  Marland Kitchen  CALCIUM PO, Take by mouth. .  Cholecalciferol (VITAMIN D3) 2000 units capsule, Take by mouth. .  esomeprazole (NEXIUM) 40 MG capsule, Take by mouth. .  mometasone (ELOCON) 0.1 % lotion, APPLY 4  DROPS  TO EACH EAR CANAL HS ONCE PER WEEK AND PRF ITCHING AND DRY SKIN .  ondansetron (ZOFRAN) 4 MG tablet, Take 1 tablet (4 mg total) by mouth every 8 (eight) hours as needed for nausea or vomiting. Marland Kitchen  rOPINIRole (REQUIP) 0.25 MG tablet, TAKE 1 TABLET(0.25 MG) BY MOUTH AT BEDTIME AS NEEDED .  traZODone (DESYREL) 50 MG tablet, Take 1 tablet (50 mg total) by mouth at bedtime. (Patient taking differently: Take 50 mg by mouth at bedtime. )    Past medical history, social, surgical and family history all reviewed in electronic medical record.  No pertanent information unless stated regarding to the chief complaint.   Review of Systems:  No headache, visual changes, nausea, vomiting, diarrhea, constipation, dizziness, abdominal pain, skin rash, fevers, chills, night sweats, weight loss, swollen lymph nodes, body aches, joint swelling, chest pain, shortness of breath, mood changes.  Positive muscle aches  Objective  Blood pressure 126/84, pulse 79, height 5' 1"  (1.549 m), weight 227 lb (103 kg), SpO2 97 %.   General: No apparent distress alert and oriented x3 mood and affect normal, dressed appropriately.  HEENT: Pupils equal, extraocular movements intact  Respiratory: Patient's speak in full sentences and does not appear short of breath  Cardiovascular: No lower extremity edema, non tender, no erythema  Skin: Warm dry intact with no signs of  infection or rash on extremities or on axial skeleton.  Abdomen: Soft nontender  Neuro: Cranial nerves II through XII are intact, neurovascularly intact in all extremities with 2+ DTRs and 2+ pulses.  Lymph: No lymphadenopathy of posterior or anterior cervical chain or axillae bilaterally.  Gait normal with good balance and coordination.  MSK:  Non tender with full range of motion and good stability and symmetric strength and tone of  elbows, wrist, hip, knee and ankles bilaterally.  Patient does have arthritic changes of multiple joints  Left shoulder exam has 4+ out of 5 strength of the rotator cuff.  Decreased range of motion especially with external range of motion compared to contralateral side.  Internal range of motion to sacrum.  Near full forward flexion.  Neurovascular intact distally.  Mild positive impingement  Procedure: Real-time Ultrasound Guided Injection of left glenohumeral joint Device: GE Logiq E  Ultrasound guided injection is preferred based studies that show increased duration, increased effect, greater accuracy, decreased procedural pain, increased response rate with ultrasound guided versus blind injection.  Verbal informed consent obtained.  Time-out conducted.  Noted no overlying erythema, induration, or other signs of local infection.  Skin prepped in a sterile fashion.  Local anesthesia: Topical Ethyl chloride.  With sterile technique and under real time ultrasound guidance:  Joint visualized.  21g 2 inch needle inserted posterior approach. Pictures taken for needle placement. Patient did have injection of 2 cc of 0.5% Marcaine, and 1cc of Kenalog 40 mg/dL. Completed without difficulty  Pain immediately resolved suggesting accurate placement of the medication.  Advised to call if fevers/chills, erythema, induration, drainage, or persistent bleeding.  Images permanently stored and available for review in the ultrasound unit.  Impression: Technically successful  ultrasound guided injection.  [id number]; 15 additional minutes spent for Therapeutic exercises as stated in above notes.  This included exercises focusing on stretching, strengthening, with significant focus on eccentric aspects.   Long term goals include an improvement in range of motion, strength, endurance as well as avoiding reinjury. Patient's frequency would include in 1-2 times a day, 3-5 times a week for a duration of 6-12 weeks.  Shoulder Exercises that included:  Basic scapular stabilization to include adduction and depression of scapula Scaption, focusing on proper movement and good control Internal and External rotation utilizing a theraband, with elbow tucked at side entire time Rows with theraband which was given  Proper technique shown and discussed handout in great detail with ATC.  All questions were discussed and answered.     Impression and Recommendations:     This case required medical decision making of moderate complexity. The above documentation has been reviewed and is accurate and complete Lyndal Pulley, DO       Note: This dictation was prepared with Dragon dictation along with smaller phrase technology. Any transcriptional errors that result from this process are unintentional.

## 2017-11-14 ENCOUNTER — Ambulatory Visit: Payer: Medicare Other | Admitting: Family Medicine

## 2017-11-14 ENCOUNTER — Encounter: Payer: Self-pay | Admitting: Family Medicine

## 2017-11-14 ENCOUNTER — Ambulatory Visit: Payer: Self-pay

## 2017-11-14 VITALS — BP 126/84 | HR 79 | Ht 61.0 in | Wt 227.0 lb

## 2017-11-14 DIAGNOSIS — M25512 Pain in left shoulder: Secondary | ICD-10-CM

## 2017-11-14 DIAGNOSIS — G8929 Other chronic pain: Secondary | ICD-10-CM | POA: Diagnosis not present

## 2017-11-14 DIAGNOSIS — M7502 Adhesive capsulitis of left shoulder: Secondary | ICD-10-CM

## 2017-11-14 DIAGNOSIS — M7500 Adhesive capsulitis of unspecified shoulder: Secondary | ICD-10-CM | POA: Insufficient documentation

## 2017-11-14 NOTE — Patient Instructions (Signed)
Good to see you  Seems to be frozen shoulder Ic eis your friend Try to keep the shoulder moving Exercises 3 times a week.  See me again in 6 weeks

## 2017-11-14 NOTE — Assessment & Plan Note (Signed)
New problem.  Given injection.  Tolerated the procedure well.  Differential includes potential osteoarthritic changes.  Hold on x-rays at this time.  Home exercises given.  Discussed icing regimen, discussed topical anti-inflammatories.  Patient does have good grip strength.  Patient will follow-up with me again in 4 to 6 weeks to make sure patient is improving.

## 2017-12-05 ENCOUNTER — Ambulatory Visit (INDEPENDENT_AMBULATORY_CARE_PROVIDER_SITE_OTHER): Payer: Medicare Other | Admitting: *Deleted

## 2017-12-05 DIAGNOSIS — E538 Deficiency of other specified B group vitamins: Secondary | ICD-10-CM

## 2017-12-05 MED ORDER — CYANOCOBALAMIN 1000 MCG/ML IJ SOLN
1000.0000 ug | Freq: Once | INTRAMUSCULAR | Status: AC
  Administered 2017-12-05: 1000 ug via INTRAMUSCULAR

## 2017-12-05 NOTE — Progress Notes (Signed)
Patient presented for B 12 injection to right deltoid, patient voiced no concerns nor showed any signs of distress during injection. 

## 2017-12-25 ENCOUNTER — Ambulatory Visit (INDEPENDENT_AMBULATORY_CARE_PROVIDER_SITE_OTHER): Payer: Medicare Other | Admitting: Family

## 2017-12-25 ENCOUNTER — Encounter: Payer: Self-pay | Admitting: Family

## 2017-12-25 VITALS — BP 116/66 | HR 77 | Temp 97.9°F | Wt 228.2 lb

## 2017-12-25 DIAGNOSIS — R5382 Chronic fatigue, unspecified: Secondary | ICD-10-CM

## 2017-12-25 DIAGNOSIS — Z1239 Encounter for other screening for malignant neoplasm of breast: Secondary | ICD-10-CM

## 2017-12-25 NOTE — Progress Notes (Signed)
Subjective:    Patient ID: Morgan Hurst, female    DOB: 03-Apr-1945, 72 y.o.   MRN: [id number]  CC: Morgan Hurst is a 72 y.o. female who presents today for follow up.   HPI: Complains of fatigue, x 2 months, worsening. Doesn't feel like it is depression. No si/hi.   Has gained weight. Snores. Trouble staying asleep , will get up to iron in the middle of the night.   Has to take a nap everyday.  Not sure if left shoulder pain is contributing.  Has not been excercising as legs get tired.   No unintentional weight loss, night sweats.    Denies leg swelling, exertional chest pain or pressure, numbness or tingling radiating to left arm or jaw, palpitations, dizziness, frequent headaches, changes in vision, or shortness of breath.   Taking 79m prn trazodone with some help with sleep. When took 570mtrazodone, she felt more groggy.   Lower extremity cramping - taking requip qhs for this.   OA- takes celebrex prn for 'my osteoarthritis.'   b12 injections not helping 'like they used too.'   DM- on metformin  Following with [facility]arden[facility] Phlegmor left frozen shoulder.     Colonoscopy UTD Mammogram due  HISTORY:  Past Medical History:  Diagnosis Date  . Arthritis    lower back, right hip  . Borderline diabetes    PCP STARTED PT ON METFORMIN IN 2016 DUE TO ELEVATED GLUCOSE   . Complication of anesthesia    FIGHTING WHEN WAKING UP FROM ANESTHESIA  . [place]  . Endometriosis   . Heart murmur   . Orthodontics    top front 3 teeth caps and bridge   Past Surgical History:  Procedure Laterality Date  . ABDOMINAL HYSTERECTOMY  age 72. ANKLE SURGERY    . CHOLECYSTECTOMY N/A 04/10/2015   Procedure: LAPAROSCOPIC CHOLECYSTECTOMY WITH INTRAOPERATIVE CHOLANGIOGRAM;  Surgeon: JeRobert BellowMD;  Location: [facility] ORS;  Service: General;  Laterality: N/A;  . COLONOSCOPY WITH PROPOFOL N/A 07/10/2017   Procedure: COLONOSCOPY WITH PROPOFOL;   Surgeon: AnJonathon BellowsMD;  [facility];  Service: Gastroenterology;  Laterality: N/A;  . ESOPHAGOGASTRODUODENOSCOPY N/A 05/12/2015   Procedure: ESOPHAGOGASTRODUODENOSCOPY (EGD);  Surgeon: PaHulen LusterMD;  [place]: [place];  Laterality: N/A;  . VAGINAL DELIVERY  2   Family History  Problem Relation Age of Onset  . Diabetes Mother   . Cancer Mother        colon and breast  . Cancer Father        Leukemia  . Cancer Brother        colon and lung  . Cholelithiasis Daughter   . Heart disease Paternal Grandfather   . Cancer Other 29       colon    Allergies: Patient has no known allergies. Current Outpatient Medications on File Prior to Visit  Medication Sig Dispense Refill  . acetaminophen (TYLENOL) 325 MG tablet Take by mouth.    . Marland Kitchenspirin 81 MG tablet Take 81 mg by mouth daily.    . Marland KitchenALCIUM PO Take by mouth.    . celecoxib (CELEBREX) 100 MG capsule Take 1 capsule (100 mg total) by mouth 2 (two) times daily as needed. 120 capsule 1  . Cholecalciferol (VITAMIN D3) 2000 units capsule Take by mouth.    . colesevelam (WELCHOL) 625 MG tablet Take 625 mg by mouth daily.    . cyanocobalamin (,VITAMIN  B-12,) 1000 MCG/ML injection Inject into the muscle.    . esomeprazole (NEXIUM) 40 MG capsule Take by mouth.    . metFORMIN (GLUCOPHAGE) 500 MG tablet Take 500 mg by mouth daily.    . mometasone (ELOCON) 0.1 % lotion APPLY 4  DROPS  TO EACH EAR CANAL HS ONCE PER WEEK AND PRF ITCHING AND DRY SKIN  12  . ondansetron (ZOFRAN) 4 MG tablet Take 1 tablet (4 mg total) by mouth every 8 (eight) hours as needed for nausea or vomiting. 20 tablet 0  . rOPINIRole (REQUIP) 0.25 MG tablet TAKE 1 TABLET(0.25 MG) BY MOUTH AT BEDTIME AS NEEDED 90 tablet 0  . traMADol (ULTRAM) 50 MG tablet TAKE 1 TABLET BY MOUTH DAILY AS NEEDED 30 tablet 1  . traZODone (DESYREL) 50 MG tablet Take 1 tablet (50 mg total) by mouth at bedtime. (Patient taking differently: Take 50 mg  by mouth at bedtime. ) 90 tablet 1   No current facility-administered medications on file prior to visit.     Social History   Tobacco Use  . Smoking status: Never Smoker  . Smokeless tobacco: Never Used  Substance Use Topics  . Alcohol use: No  . Drug use: No    Review of Systems  Constitutional: Positive for fatigue. Negative for chills, fever and unexpected weight change.  Respiratory: Negative for cough.   Cardiovascular: Negative for chest pain and palpitations.  Gastrointestinal: Negative for nausea and vomiting.  Musculoskeletal: Positive for arthralgias.  Psychiatric/Behavioral: Positive for sleep disturbance. Negative for suicidal ideas. The patient is not nervous/anxious.       Objective:    BP 116/66 (BP Location: Left Arm, Patient Position: Sitting, Cuff Size: Large)   Pulse 77   Temp 97.9 F (36.6 C)   Wt 228 lb 3.2 oz (103.5 kg)   SpO2 98%   BMI 43.12 kg/m  BP Readings from Last 3 Encounters:  12/25/17 116/66  11/14/17 126/84  08/22/17 122/64   Wt Readings from Last 3 Encounters:  12/25/17 228 lb 3.2 oz (103.5 kg)  11/14/17 227 lb (103 kg)  08/22/17 225 lb 6.4 oz (102.2 kg)    Physical Exam  Constitutional: She appears well-developed and well-nourished.  Eyes: Conjunctivae are normal.  Cardiovascular: Normal rate, regular rhythm, normal heart sounds and normal pulses.  Pulmonary/Chest: Effort normal and breath sounds normal. She has no wheezes. She has no rhonchi. She has no rales.  Neurological: She is alert.  Skin: Skin is warm and dry.  Psychiatric: She has a normal mood and affect. Her speech is normal and behavior is normal. Thought content normal.  Vitals reviewed.      Assessment & Plan:   Problem List Items Addressed This Visit      Other   Fatigue - Primary    Suspect multifactorial including lack of exercise, poor sleep quality.  Discussed with her trying again the trazodone 50 mg to see if this helps her stay asleep.  Advised  against napping during the day.  Encouraged exercise.  Pending thorough lab evaluation to look for metabolic etiologies, CXR, and close follow-up advised.  Pending repeat sleep study as prior study had been at [facility] for the sleep study to be done in the lab.  Due for mammogram, patient understands to schedule.      Relevant Orders   Ambulatory referral to Sleep Studies   CBC with Differential/Platelet   Comprehensive metabolic panel   TSH   Hemoglobin A1c   VITAMIN D 25  Hydroxy (Vit-D Deficiency, Fractures)   B12 and Folate Panel   DG Chest 2 View    Other Visit Diagnoses    Screening for breast cancer       Relevant Orders   MM DIGITAL SCREENING BILATERAL       I am having Morgan Hurst maintain her aspirin, acetaminophen, Vitamin D3, CALCIUM PO, metFORMIN, esomeprazole, traZODone, cyanocobalamin, mometasone, colesevelam, ondansetron, celecoxib, rOPINIRole, and traMADol.   No orders of the defined types were placed in this encounter.   Return precautions given.   Risks, benefits, and alternatives of the medications and treatment plan prescribed today were discussed, and patient expressed understanding.   Education regarding symptom management and diagnosis given to patient on AVS.  Continue to follow with Burnard Hawthorne, FNP for routine health maintenance.   [name] and I agreed with plan.   Mable Paris, FNP

## 2017-12-25 NOTE — Assessment & Plan Note (Signed)
Suspect multifactorial including lack of exercise, poor sleep quality.  Discussed with her trying again the trazodone 50 mg to see if this helps her stay asleep.  Advised against napping during the day.  Encouraged exercise.  Pending thorough lab evaluation to look for metabolic etiologies, CXR, and close follow-up advised.  Pending repeat sleep study as prior study had been at [facility] for the sleep study to be done in the lab.  Due for mammogram, patient understands to schedule.

## 2017-12-25 NOTE — Patient Instructions (Addendum)
Chest xray later this week  I would advise against napping during the day. Start walking program during the day.   I would suggest trying the 48m ( full tablet ) of trazodone.   Today we discussed referrals, orders. Sleep study   I have placed these orders in the system for you.  Please be sure to give [facility] call if you have not heard from our office regarding this. We should hear from uKoreawithin ONE week with information regarding your appointment. If not, please let me know immediately.   We placed a referral for mammogram this year. I asked that you call one the below locations and schedule this when it is convenient for you.   As discussed, I would like you to ask for 3D mammogram over the traditional 2D mammogram as new evidence suggest 3D is superior.   Please note that NOT all insurance companies cover 3D and you may have to pay a higher copay. You may call your insurance company to further clarify your benefits.   [place] * Offers 3D mammogram if you ask*[facility]* Note if you ask for 3D mammogram at this location, you must request [place]*

## 2017-12-25 NOTE — Progress Notes (Signed)
[facility] [place], [place] [address] Phone: [phone] Subjective:       CC: Left shoulder pain  LKT:[id number]  Morgan Hurst is a 72 y.o. female coming in with complaint of left shoulder pain. Did have injection last visit. Did not feel any difference. She is having pain mostly with movement. 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 Has dull pain at rest. Denies any radiating symptoms.  Patient did have injection previously.  Patient states that the pain is more in the scapular region.        Past Medical History:  Diagnosis Date  . Arthritis    lower back, right hip  . Borderline diabetes    PCP STARTED PT ON METFORMIN IN 2014-03-15 DUE TO ELEVATED GLUCOSE   . Complication of anesthesia    FIGHTING WHEN WAKING UP FROM ANESTHESIA  . Diabetes mellitus without complication ([place])   . Endometriosis   . Heart murmur   . Orthodontics    top front 3 teeth caps and bridge   Past Surgical History:  Procedure Laterality Date  . ABDOMINAL HYSTERECTOMY  age 59  . ANKLE SURGERY    . CHOLECYSTECTOMY N/A 04/10/2015   Procedure: LAPAROSCOPIC CHOLECYSTECTOMY WITH INTRAOPERATIVE CHOLANGIOGRAM;  Surgeon: Robert Bellow, MD;  Location: [facility] ORS;  Service: General;  Laterality: N/A;  . COLONOSCOPY WITH PROPOFOL N/A 07/10/2017   Procedure: COLONOSCOPY WITH PROPOFOL;  Surgeon: Jonathon Bellows, MD;  Location: [facility] ENDOSCOPY;  Service: Gastroenterology;  Laterality: N/A;  . ESOPHAGOGASTRODUODENOSCOPY N/A 05/12/2015   Procedure: ESOPHAGOGASTRODUODENOSCOPY (EGD);  Surgeon: Hulen Luster, MD;  Location: [place];  Service: Gastroenterology;  Laterality: N/A;  . VAGINAL DELIVERY  2   Social History   Socioeconomic History  . Marital status: Widowed    Spouse name: Not on file  . Number of children: Not on file  . Years of education: Not on file  . Highest education level: Not on file  Occupational History  . Not on file  Social Needs    . Financial resource strain: Not hard at all  . Food insecurity:    Worry: Never true    Inability: Never true  . Transportation needs:    Medical: No    Non-medical: No  Tobacco Use  . Smoking status: Never Smoker  . Smokeless tobacco: Never Used  Substance and Sexual Activity  . Alcohol use: No  . Drug use: No  . Sexual activity: Never  Lifestyle  . Physical activity:    Days per week: 0 days    Minutes per session: 0 min  . Stress: Not at all  Relationships  . Social connections:    Talks on phone: Not on file    Gets together: Not on file    Attends religious service: Not on file    Active member of club or organization: Not on file    Attends meetings of clubs or organizations: Not on file    Relationship status: Not on file  Other Topics Concern  . Not on file  Social History Narrative   Lives in [place].       Husband passed away 03-15-14 from PNA.      Work - retired from [employer] - regular; working on [employer]   Exercise - walks occasionally, limited by fatigue. 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  No Known Allergies Family History  Problem Relation Age of Onset  . Diabetes Mother   . Cancer Mother        colon and breast  . Cancer Father        Leukemia  . Cancer Brother        colon and lung  . Cholelithiasis Daughter   . Heart disease Paternal Grandfather   . Cancer Other 29       colon    Current Outpatient Medications (Endocrine & Metabolic):  .  metFORMIN (GLUCOPHAGE) 500 MG tablet, Take 500 mg by mouth daily.  Current Outpatient Medications (Cardiovascular):  .  colesevelam (WELCHOL) 625 MG tablet, Take 625 mg by mouth daily.   Current Outpatient Medications (Analgesics):  .  acetaminophen (TYLENOL) 325 MG tablet, Take by mouth. Marland Kitchen  aspirin 81 MG tablet, Take 81 mg by mouth daily. .  celecoxib (CELEBREX) 100 MG capsule, Take 1 capsule (100 mg total) by mouth 2 (two) times daily as needed. .   traMADol (ULTRAM) 50 MG tablet, TAKE 1 TABLET BY MOUTH DAILY AS NEEDED  Current Outpatient Medications (Hematological):  .  cyanocobalamin (,VITAMIN B-12,) 1000 MCG/ML injection, Inject into the muscle.  Current Outpatient Medications (Other):  Marland Kitchen  CALCIUM PO, Take by mouth. .  Cholecalciferol (VITAMIN D3) 2000 units capsule, Take by mouth. .  esomeprazole (NEXIUM) 40 MG capsule, Take by mouth. .  mometasone (ELOCON) 0.1 % lotion, APPLY 4  DROPS  TO EACH EAR CANAL HS ONCE PER WEEK AND PRF ITCHING AND DRY SKIN .  ondansetron (ZOFRAN) 4 MG tablet, Take 1 tablet (4 mg total) by mouth every 8 (eight) hours as needed for nausea or vomiting. Marland Kitchen  rOPINIRole (REQUIP) 0.25 MG tablet, TAKE 1 TABLET(0.25 MG) BY MOUTH AT BEDTIME AS NEEDED .  traZODone (DESYREL) 50 MG tablet, Take 1 tablet (50 mg total) by mouth at bedtime. (Patient taking differently: Take 50 mg by mouth at bedtime. )    Past medical history, social, surgical and family history all reviewed in electronic medical record.  No pertanent information unless stated regarding to the chief complaint.   Review of Systems:  No headache, visual changes, nausea, vomiting, diarrhea, constipation, dizziness, abdominal pain, skin rash, fevers, chills, night sweats, weight loss, swollen lymph nodes, body aches, joint swelling,  chest pain, shortness of breath, mood changes.  Positive muscle aches  Objective  Blood pressure 126/80, pulse 75, height 5' 1"  (1.549 m), weight 224 lb (101.6 kg), SpO2 97 %.   General: No apparent distress alert and oriented x3 mood and affect normal, dressed appropriately.  HEENT: Pupils equal, extraocular movements intact  Respiratory: Patient's speak in full sentences and does not appear short of breath  Cardiovascular: No lower extremity edema, non tender, no erythema  Skin: Warm dry intact with no signs of infection or rash on extremities or on axial skeleton.  Abdomen: Soft nontender  Neuro: Cranial nerves II through  XII are intact, neurovascularly intact in all extremities with 2+ DTRs and 2+ pulses.  Lymph: No lymphadenopathy of posterior or anterior cervical chain or axillae bilaterally.  Gait antalgic MSK:  Non tender with full range of motion and good stability and symmetric strength and tone of  elbows, wrist, hip, knee and ankles bilaterally.  Left shoulder exam does have positive impingement.  Multiple trigger points in the left trapezius muscle and shoulder girdle.  4+ out of 5 strength of the rotator cuff   After verbal consent patient was prepped with  alcohol swabs and with a 25-gauge 1 inch needle injected and 4 distinct trigger points in the left shoulder girdle.  Patient Wednesday injected with a total of 3 cc of 0.5% Marcaine and 1 cc of Kenalog 40 mg/mL no blood loss.  Postinjection instructions    Impression and Recommendations:     This case required medical decision making of moderate complexity. The above documentation has been reviewed and is accurate and complete Lyndal Pulley, DO       Note: This dictation was prepared with Dragon dictation along with smaller phrase technology. Any transcriptional errors that result from this process are unintentional.

## 2017-12-26 ENCOUNTER — Ambulatory Visit: Payer: Medicare Other | Admitting: Family Medicine

## 2017-12-26 ENCOUNTER — Encounter: Payer: Self-pay | Admitting: Family Medicine

## 2017-12-26 DIAGNOSIS — M25512 Pain in left shoulder: Secondary | ICD-10-CM | POA: Diagnosis not present

## 2017-12-26 LAB — CBC WITH DIFFERENTIAL/PLATELET
BASOS ABS: 0.1 10*3/uL (ref 0.0–0.1)
Basophils Relative: 0.9 % (ref 0.0–3.0)
Eosinophils Absolute: 0.3 10*3/uL (ref 0.0–0.7)
Eosinophils Relative: 2.7 % (ref 0.0–5.0)
HCT: 43.9 % (ref 36.0–46.0)
Hemoglobin: 14.6 g/dL (ref 12.0–15.0)
Lymphocytes Relative: 27.5 % (ref 12.0–46.0)
Lymphs Abs: 3 10*3/uL (ref 0.7–4.0)
MCHC: 33.2 g/dL (ref 30.0–36.0)
MCV: 87.9 fl (ref 78.0–100.0)
MONO ABS: 0.9 10*3/uL (ref 0.1–1.0)
MONOS PCT: 8.7 % (ref 3.0–12.0)
NEUTROS ABS: 6.5 10*3/uL (ref 1.4–7.7)
NEUTROS PCT: 60.2 % (ref 43.0–77.0)
PLATELETS: 196 10*3/uL (ref 150.0–400.0)
RBC: 4.99 Mil/uL (ref 3.87–5.11)
RDW: 14.2 % (ref 11.5–15.5)
WBC: 10.8 10*3/uL — ABNORMAL HIGH (ref 4.0–10.5)

## 2017-12-26 LAB — COMPREHENSIVE METABOLIC PANEL
ALT: 23 U/L (ref 0–35)
AST: 31 U/L (ref 0–37)
Albumin: 3.9 g/dL (ref 3.5–5.2)
Alkaline Phosphatase: 78 U/L (ref 39–117)
BUN: 12 mg/dL (ref 6–23)
CO2: 25 mEq/L (ref 19–32)
Calcium: 9.6 mg/dL (ref 8.4–10.5)
Chloride: 105 mEq/L (ref 96–112)
Creatinine, Ser: 0.6 mg/dL (ref 0.40–1.20)
GFR: 104.58 mL/min (ref >60.00)
GLUCOSE: 90 mg/dL (ref 70–99)
POTASSIUM: 3.9 meq/L (ref 3.5–5.1)
SODIUM: 140 meq/L (ref 135–145)
Total Bilirubin: 0.4 mg/dL (ref 0.2–1.2)
Total Protein: 7.4 g/dL (ref 6.0–8.3)

## 2017-12-26 LAB — VITAMIN D 25 HYDROXY (VIT D DEFICIENCY, FRACTURES): VITD: 25.42 ng/mL — AB (ref 30.00–100.00)

## 2017-12-26 LAB — TSH: TSH: 3.26 u[IU]/mL (ref 0.35–4.50)

## 2017-12-26 LAB — HEMOGLOBIN A1C: Hgb A1c MFr Bld: 5.9 % (ref 4.6–6.5)

## 2017-12-26 LAB — B12 AND FOLATE PANEL
Folate: 18.7 ng/mL (ref >5.9)
VITAMIN B 12: 406 pg/mL (ref 211–911)

## 2017-12-26 NOTE — Assessment & Plan Note (Signed)
Patient given injection today.  Tolerated the procedure well.  Discussed icing regimen and home exercise.  Hopefully patient will have some response to this.  Otherwise acromioclavicular injection is within the differential.  Also cervical radiculopathy but does not appear to be having them.  Patient did not respond as well to the steroid injection previously with a glenohumeral joint injection.  Continue home exercises and follow-up again in 4 weeks

## 2017-12-26 NOTE — Patient Instructions (Signed)
Good to see you  Ice is your friend Tried trigger points today and really hope it helps Stay active Have a follow up ready in 5-6 weeks Happy holidays!

## 2017-12-28 ENCOUNTER — Ambulatory Visit (INDEPENDENT_AMBULATORY_CARE_PROVIDER_SITE_OTHER): Payer: Medicare Other

## 2017-12-28 DIAGNOSIS — R5382 Chronic fatigue, unspecified: Secondary | ICD-10-CM | POA: Diagnosis not present

## 2017-12-28 NOTE — Addendum Note (Signed)
Addended by: Leeanne Rio on: 12/28/2017 08:58 AM   Modules accepted: Orders

## 2017-12-29 ENCOUNTER — Other Ambulatory Visit: Payer: Self-pay | Admitting: Family

## 2017-12-29 DIAGNOSIS — R5382 Chronic fatigue, unspecified: Secondary | ICD-10-CM

## 2017-12-29 NOTE — Addendum Note (Signed)
Addended by: Burnard Hawthorne on: 12/29/2017 12:17 PM   Modules accepted: Orders

## 2018-01-01 ENCOUNTER — Telehealth: Payer: Self-pay | Admitting: Family

## 2018-01-01 NOTE — Telephone Encounter (Signed)
Call returned to pt. to answer questions re: lab results and recommendations.  Advised of recommendations per Mable Paris, as was documented in MyChart message.  Scheduled repeat CBC in 2 weeks, and repeat Vita D level in 3 mos., per recommendation of PCP.  All questions were answered.  Pt. Verb. Understanding.  (documented in telephone encounter, since lab result note was not forwarded to Nurse Triage.)       Message from Loma Boston sent at 01/01/2018 10:47 AM EST   Summary: return lab results call   Needs return call on Lab results/ phone connection bad / call home phone [phone]/ [facility]        Call History    Type Contact Phone User  01/01/2018 10:44 AM Phone (Incoming) Sonda, [place] (Self) [phone] Lemmie Evens) Loma Boston  Needs results from lab, called in on cell phone with bad connection/ has been called but missed message

## 2018-01-01 NOTE — Telephone Encounter (Signed)
This encounter was created in error - please disregard.

## 2018-01-03 ENCOUNTER — Ambulatory Visit (INDEPENDENT_AMBULATORY_CARE_PROVIDER_SITE_OTHER): Payer: Medicare Other | Admitting: Lab

## 2018-01-03 DIAGNOSIS — E538 Deficiency of other specified B group vitamins: Secondary | ICD-10-CM | POA: Diagnosis not present

## 2018-01-03 MED ORDER — CYANOCOBALAMIN 1000 MCG/ML IJ SOLN
1000.0000 ug | Freq: Once | INTRAMUSCULAR | Status: AC
  Administered 2018-01-03: 1000 ug via INTRAMUSCULAR

## 2018-01-03 NOTE — Progress Notes (Signed)
Pt here today for VB12 injection Pt tolerated well

## 2018-01-15 ENCOUNTER — Other Ambulatory Visit: Payer: Medicare Other

## 2018-02-06 ENCOUNTER — Ambulatory Visit: Payer: Medicare Other | Admitting: Family Medicine

## 2018-02-07 ENCOUNTER — Ambulatory Visit (INDEPENDENT_AMBULATORY_CARE_PROVIDER_SITE_OTHER): Payer: Medicare Other

## 2018-02-07 ENCOUNTER — Other Ambulatory Visit (INDEPENDENT_AMBULATORY_CARE_PROVIDER_SITE_OTHER): Payer: Medicare Other

## 2018-02-07 DIAGNOSIS — E538 Deficiency of other specified B group vitamins: Secondary | ICD-10-CM | POA: Diagnosis not present

## 2018-02-07 DIAGNOSIS — R5382 Chronic fatigue, unspecified: Secondary | ICD-10-CM | POA: Diagnosis not present

## 2018-02-07 LAB — CBC WITH DIFFERENTIAL/PLATELET
Basophils Absolute: 0 10*3/uL (ref 0.0–0.1)
Basophils Relative: 0.5 % (ref 0.0–3.0)
EOS ABS: 0.4 10*3/uL (ref 0.0–0.7)
Eosinophils Relative: 5.3 % — ABNORMAL HIGH (ref 0.0–5.0)
HCT: 43.7 % (ref 36.0–46.0)
HEMOGLOBIN: 14.5 g/dL (ref 12.0–15.0)
LYMPHS ABS: 2.5 10*3/uL (ref 0.7–4.0)
Lymphocytes Relative: 33.7 % (ref 12.0–46.0)
MCHC: 33.3 g/dL (ref 30.0–36.0)
MCV: 87.5 fl (ref 78.0–100.0)
MONO ABS: 0.7 10*3/uL (ref 0.1–1.0)
Monocytes Relative: 9.4 % (ref 3.0–12.0)
NEUTROS PCT: 51.1 % (ref 43.0–77.0)
Neutro Abs: 3.9 10*3/uL (ref 1.4–7.7)
Platelets: 176 10*3/uL (ref 150.0–400.0)
RBC: 4.99 Mil/uL (ref 3.87–5.11)
RDW: 14.1 % (ref 11.5–15.5)
WBC: 7.5 10*3/uL (ref 4.0–10.5)

## 2018-02-07 MED ORDER — CYANOCOBALAMIN 1000 MCG/ML IJ SOLN
1000.0000 ug | Freq: Once | INTRAMUSCULAR | Status: AC
  Administered 2018-02-07: 1000 ug via INTRAMUSCULAR

## 2018-02-07 NOTE — Progress Notes (Signed)
Patient came in today to get B-12 injection in RD.  Patient tolerated well.

## 2018-03-14 ENCOUNTER — Ambulatory Visit: Payer: Medicare Other

## 2018-03-22 ENCOUNTER — Ambulatory Visit (INDEPENDENT_AMBULATORY_CARE_PROVIDER_SITE_OTHER): Payer: Medicare Other | Admitting: Lab

## 2018-03-22 DIAGNOSIS — E538 Deficiency of other specified B group vitamins: Secondary | ICD-10-CM

## 2018-03-22 MED ORDER — CYANOCOBALAMIN 1000 MCG/ML IJ SOLN
1000.0000 ug | Freq: Once | INTRAMUSCULAR | Status: AC
  Administered 2018-03-22: 1000 ug via INTRAMUSCULAR

## 2018-03-22 NOTE — Progress Notes (Signed)
Pt here today for VB12 injection in Right Deltoid. Pt tolerated well.

## 2018-03-30 ENCOUNTER — Other Ambulatory Visit: Payer: Self-pay | Admitting: Family

## 2018-03-30 ENCOUNTER — Telehealth: Payer: Self-pay | Admitting: Radiology

## 2018-03-30 DIAGNOSIS — M48062 Spinal stenosis, lumbar region with neurogenic claudication: Secondary | ICD-10-CM

## 2018-03-30 DIAGNOSIS — E559 Vitamin D deficiency, unspecified: Secondary | ICD-10-CM

## 2018-03-30 DIAGNOSIS — K760 Fatty (change of) liver, not elsewhere classified: Secondary | ICD-10-CM

## 2018-03-30 NOTE — Telephone Encounter (Signed)
Call patient I refilled her tramadol.  However she needs to be seen every 3 months.  Last office visit was December.  Please make a follow-up appointment for her since a controlled substance.  I looked up patient on [place] Controlled Substances Reporting System and saw no activity that raised concern of inappropriate use.

## 2018-03-30 NOTE — Telephone Encounter (Signed)
Patient is scheduled 04/04/18.

## 2018-03-30 NOTE — Telephone Encounter (Signed)
Pt coming in for labs Monday, please place future orders. Thank you 

## 2018-03-30 NOTE — Addendum Note (Signed)
Addended by: Burnard Hawthorne on: 03/30/2018 10:44 AM   Modules accepted: Orders

## 2018-04-02 ENCOUNTER — Other Ambulatory Visit (INDEPENDENT_AMBULATORY_CARE_PROVIDER_SITE_OTHER): Payer: Medicare Other

## 2018-04-02 ENCOUNTER — Other Ambulatory Visit: Payer: Self-pay

## 2018-04-02 DIAGNOSIS — K760 Fatty (change of) liver, not elsewhere classified: Secondary | ICD-10-CM | POA: Diagnosis not present

## 2018-04-02 DIAGNOSIS — E559 Vitamin D deficiency, unspecified: Secondary | ICD-10-CM | POA: Diagnosis not present

## 2018-04-02 LAB — LIPID PANEL
CHOLESTEROL: 197 mg/dL (ref 0–200)
HDL: 48.5 mg/dL (ref >39.00)
LDL Cholesterol: 123 mg/dL — ABNORMAL HIGH (ref 0–99)
NonHDL: 148.18
TRIGLYCERIDES: 128 mg/dL (ref 0.0–149.0)
Total CHOL/HDL Ratio: 4
VLDL: 25.6 mg/dL (ref 0.0–40.0)

## 2018-04-02 LAB — VITAMIN D 25 HYDROXY (VIT D DEFICIENCY, FRACTURES): VITD: 27.53 ng/mL — ABNORMAL LOW (ref 30.00–100.00)

## 2018-04-02 NOTE — Progress Notes (Signed)
Subjective:    Patient ID: Morgan Hurst, female    DOB: Dec 10, 1945, 72 y.o.   MRN: [id number]  CC: Morgan Hurst is a 72 y.o. female who presents today for follow up.   HPI: Complaint of dry cough and nasal congestion x 6 days, improved today. No fever today.   Deslym, cloricidin with improvement.   Had tmax 100.8 yesterday. At that time had chills. rested and stayed home.  No SOB. Two nights ago when laying down felt she may have had a wheeze. No cp, leg swelling.   No travel. Non smoker.   Hyperlipidemia-resistant to start statins.    Complains of legs being 'weak' for weeks, unchanged.  No numbness or pain in legs.  No pain in low back or groin. No trouble urinating.   Fatigue similar last year.  Pending evaluation sleep study.    No falls. Works in yard, and house. In warmer weather, out in yard.   Muscle cramps- improved on requip 3x per week.   Has stopped celebrex.     Has Dr [name] 12/2017.  Has seen Dr [name], 01/2018.  Follow up in 6 months HISTORY:  Past Medical History:  Diagnosis Date   Arthritis    lower back, right hip   Borderline diabetes    PCP STARTED PT ON METFORMIN IN 2016 DUE TO ELEVATED GLUCOSE    Complication of anesthesia    FIGHTING WHEN WAKING UP FROM ANESTHESIA   Diabetes mellitus without complication ([place])    Endometriosis    Heart murmur    Orthodontics    top front 3 teeth caps and bridge   Past Surgical History:  Procedure Laterality Date   ABDOMINAL HYSTERECTOMY  age 69   ANKLE SURGERY     CHOLECYSTECTOMY N/A 04/10/2015   Procedure: LAPAROSCOPIC CHOLECYSTECTOMY WITH INTRAOPERATIVE CHOLANGIOGRAM;  Surgeon: Robert Bellow, MD;  Location: [facility] ORS;  Service: General;  Laterality: N/A;   COLONOSCOPY WITH PROPOFOL N/A 07/10/2017   Procedure: COLONOSCOPY WITH PROPOFOL;  Surgeon: Jonathon Bellows, MD;  Location: [facility] ENDOSCOPY;  Service: Gastroenterology;  Laterality: N/A;   ESOPHAGOGASTRODUODENOSCOPY N/A 05/12/2015   Procedure: ESOPHAGOGASTRODUODENOSCOPY (EGD);  Surgeon: Hulen Luster, MD;  Location: [place];  Service: Gastroenterology;  Laterality: N/A;   VAGINAL DELIVERY  2   Family History  Problem Relation Age of Onset   Diabetes Mother    Cancer Mother        colon and breast   Cancer Father        Leukemia   Cancer Brother        colon and lung   Cholelithiasis Daughter    Heart disease Paternal Grandfather    Cancer Other 29       colon    Allergies: Patient has no known allergies. Current Outpatient Medications on File Prior to Visit  Medication Sig Dispense Refill   acetaminophen (TYLENOL) 325 MG tablet Take by mouth.     aspirin 81 MG tablet Take 81 mg by mouth daily.     CALCIUM PO Take by mouth.     colesevelam (WELCHOL) 625 MG tablet Take 625 mg by mouth daily.     cyanocobalamin (,VITAMIN B-12,) 1000 MCG/ML injection Inject into the muscle.     esomeprazole (NEXIUM) 40 MG capsule Take by mouth.     metFORMIN (GLUCOPHAGE) 500 MG tablet Take 500 mg by mouth daily. Patient taking 2-3 times weekly.     mometasone (ELOCON) 0.1 % lotion APPLY 4  DROPS  TO EACH EAR CANAL HS ONCE PER WEEK AND PRF ITCHING AND DRY SKIN  12   ondansetron (ZOFRAN) 4 MG tablet Take 1 tablet (4 mg total) by mouth every 8 (eight) hours as needed for nausea or vomiting. 20 tablet 0   rOPINIRole (REQUIP) 0.25 MG tablet TAKE 1 TABLET(0.25 MG) BY MOUTH AT BEDTIME AS NEEDED (Patient taking differently: Take 0.25 mg by mouth at bedtime. Patient taking 2-3 times weekly.) 90 tablet 0   traMADol (ULTRAM) 50 MG tablet TAKE 1 TABLET BY MOUTH DAILY AS NEEDED 30 tablet 0   traZODone (DESYREL) 50 MG tablet Take 1 tablet (50 mg total) by mouth at bedtime. (Patient taking differently: Take 50 mg by mouth at bedtime. ) 90 tablet 1   No current facility-administered medications on file prior to visit.     Social History   Tobacco Use   Smoking status: Never Smoker   Smokeless tobacco: Never  Used  Substance Use Topics   Alcohol use: No   Drug use: No    Review of Systems  Constitutional: Positive for fatigue and fever (resolved). Negative for chills.  HENT: Positive for congestion (improved).   Respiratory: Positive for cough. Negative for wheezing.   Cardiovascular: Negative for chest pain and palpitations.  Gastrointestinal: Negative for nausea and vomiting.  Musculoskeletal: Positive for arthralgias.  Neurological: Positive for weakness. Negative for numbness and headaches.      Objective:    BP 122/74 (BP Location: Left Arm, Patient Position: Sitting, Cuff Size: Large)    Pulse 92    Temp 98.1 F (36.7 C)    Wt 232 lb 12.8 oz (105.6 kg)    SpO2 98%    BMI 43.99 kg/m  BP Readings from Last 3 Encounters:  04/04/18 122/74  12/26/17 126/80  12/25/17 116/66   Wt Readings from Last 3 Encounters:  04/04/18 232 lb 12.8 oz (105.6 kg)  12/26/17 224 lb (101.6 kg)  12/25/17 228 lb 3.2 oz (103.5 kg)    Physical Exam Vitals signs reviewed.  Constitutional:      Appearance: She is well-developed.  Eyes:     Conjunctiva/sclera: Conjunctivae normal.  Cardiovascular:     Rate and Rhythm: Normal rate and regular rhythm.     Pulses: Normal pulses.     Heart sounds: Normal heart sounds.  Pulmonary:     Effort: Pulmonary effort is normal.     Breath sounds: Normal breath sounds. No wheezing, rhonchi or rales.  Skin:    General: Skin is warm and dry.  Neurological:     Mental Status: She is alert.     Sensory: Sensation is intact.     Motor: Motor function is intact.     Coordination: Coordination is intact.     Gait: Gait is intact.     Comments: Bilateral lower extremity strength 5 out of 5.  Observed patient as she got out of chair and walked across exam room.  She did use arms to push herself out the chair.   gait was normal, steady.  Good balance.  Psychiatric:        Speech: Speech normal.        Behavior: Behavior normal.        Thought Content: Thought  content normal.        Assessment & Plan:   Problem List Items Addressed This Visit      Respiratory   Bronchitis    Cough improving.She does not meet COVID 19 testing.  Improving.  No acute respiratory distress.  Patient will let me know if she does not continue to improve.        Other   Fatigue - Primary    Fatigue is unchanged.  Patient and I discussed today that we suspect fatigue is multifactorial including deconditioning, lack of exercise, obesity.  She will start a walking program.  She will also schedule follow-up with Dr. Gardenia Phlegm whom she is established with in regards to this complaint as well to discuss whether imaging, physical therapy is an appropriate next step.      Hyperlipidemia    Patient declines starting statin therapy at this time.  We will continue to treat with lifestyle, primarily weight loss      Vitamin D deficiency   Relevant Medications   Cholecalciferol 1.25 MG (50000 UT) TABS       I have discontinued Feven M. Devlin's Vitamin D3 and celecoxib. I am also having her start on Cholecalciferol. Additionally, I am having her maintain her aspirin, acetaminophen, CALCIUM PO, metFORMIN, esomeprazole, traZODone, cyanocobalamin, mometasone, colesevelam, ondansetron, rOPINIRole, and traMADol.   Meds ordered this encounter  Medications   Cholecalciferol 1.25 MG (50000 UT) TABS    Sig: 50,000 units PO qwk for 8 weeks.    Dispense:  8 tablet    Refill:  0    Order Specific Question:   Supervising Provider    Answer:   Crecencio Mc [[id number]]    Return precautions given.   Risks, benefits, and alternatives of the medications and treatment plan prescribed today were discussed, and patient expressed understanding.   Education regarding symptom management and diagnosis given to patient on AVS.  Continue to follow with Burnard Hawthorne, FNP for routine health maintenance.   [name] and I agreed with plan.   Mable Paris, FNP

## 2018-04-03 NOTE — Progress Notes (Signed)
B12 injection. Tolerated well  LGuse FNP

## 2018-04-04 ENCOUNTER — Ambulatory Visit (INDEPENDENT_AMBULATORY_CARE_PROVIDER_SITE_OTHER): Payer: Medicare Other | Admitting: Family

## 2018-04-04 ENCOUNTER — Encounter: Payer: Self-pay | Admitting: Family

## 2018-04-04 ENCOUNTER — Other Ambulatory Visit: Payer: Self-pay

## 2018-04-04 VITALS — BP 122/74 | HR 92 | Temp 98.1°F | Wt 232.8 lb

## 2018-04-04 DIAGNOSIS — E785 Hyperlipidemia, unspecified: Secondary | ICD-10-CM | POA: Diagnosis not present

## 2018-04-04 DIAGNOSIS — E559 Vitamin D deficiency, unspecified: Secondary | ICD-10-CM

## 2018-04-04 DIAGNOSIS — R5382 Chronic fatigue, unspecified: Secondary | ICD-10-CM

## 2018-04-04 DIAGNOSIS — J40 Bronchitis, not specified as acute or chronic: Secondary | ICD-10-CM

## 2018-04-04 MED ORDER — CHOLECALCIFEROL 1.25 MG (50000 UT) PO TABS: ORAL_TABLET | ORAL | 0 refills | Status: DC

## 2018-04-04 NOTE — Patient Instructions (Addendum)
Please call Morgan Hurst, sports medicine as we discussed today.  I suspect leg weakness may be multi-factorial including deconditioning.  Exercise is paramount.  Weight loss is also very important.    Let me know if cough doesn't continue to improve.   I would stay home and limit contact  ( if at all) with others.   Stop celebrex. Trial of tylenol arthritis. May also use BioFreeze, Aspercreme. ICE ICE ICE.   Consider nutrition consult; please let me know.   Start walking program.   This is  Dr. Lupita Dawn  example of a  "Low GI"  Diet:  It will allow you to lose 4 to 8  lbs  per month if you follow it carefully.  Your goal with exercise is a minimum of 30 minutes of aerobic exercise 5 days per week (Walking does not count once it becomes easy!)    All of the foods can be found at grocery stores and in bulk at [employer].  The Atkins protein bars and shakes are available in more varieties at Target, [employer] and [employer].     7 AM Breakfast:  Choose from the following:  Low carbohydrate Protein  Shakes (I recommend the  Premier Protein chocolate shakes,  EAS AdvantEdge "Carb Control" shakes  Or the Atkins shakes all are under 3 net carbs)     a scrambled egg/bacon/cheese burrito made with Mission's "carb balance" whole wheat tortilla  (about 10 net carbs )  Regulatory affairs officer (basically a quiche without the pastry crust) that is eaten cold and very convenient way to get your eggs.  8 carbs)  If you make your own protein shakes, avoid bananas and pineapple,  And use low carb greek yogurt or original /unsweetened almond or soy milk    Avoid cereal and bananas, oatmeal and cream of wheat and grits. They are loaded with carbohydrates!   10 AM: high protein snack:  Protein bar by Atkins (the snack size, under 200 cal, usually < 6 net carbs).    A stick of cheese:  Around 1 carb,  100 cal     Dannon Light n Fit [place] Yogurt  (80 cal, 8 carbs)  Other so called "protein bars"  and Greek yogurts tend to be loaded with carbohydrates.  Remember, in food advertising, the word "energy" is synonymous for " carbohydrate."  Lunch:   A Sandwich using the bread choices listed, Can use any  Eggs,  lunchmeat, grilled meat or canned tuna), avocado, regular mayo/mustard  and cheese.  A Salad using blue cheese, ranch,  Goddess or vinagrette,  Avoid taco shells, croutons or "confetti" and no "candied nuts" but regular nuts OK.   No pretzels, nabs  or chips.  Pickles and miniature sweet peppers are a good low carb alternative that provide a "crunch"  The bread is the only source of carbohydrate in a sandwich and  can be decreased by trying some of the attached alternatives to traditional loaf bread   Avoid "Low fat dressings, as well as [place] dressings They are loaded with sugar!   3 PM/ Mid day  Snack:  Consider  1 ounce of  almonds, walnuts, pistachios, pecans, peanuts,  Macadamia nuts or a nut medley.  Avoid "granola and granola bars "  Mixed nuts are ok in moderation as long as there are no raisins,  cranberries or dried fruit.   KIND bars are OK if you get the low glycemic index  variety   Try the prosciutto/mozzarella cheese sticks by Fiorruci  In deli /backery section   High protein      6 PM  Dinner:     Meat/fowl/fish with a green salad, and either broccoli, cauliflower, green beans, spinach, brussel sprouts or  Lima beans. DO NOT BREAD THE PROTEIN!!      There is a low carb pasta by Dreamfield's that is acceptable and tastes great: only 5 digestible carbs/serving.( All grocery stores but BJs carry it ) Several ready made meals are available low carb:   Try Michel Angelo's chicken piccata or chicken or eggplant parm over low carb pasta.(Lowes and BJs)   Marjory Lies Sanchez's "Carnitas" (pulled pork, no sauce,  0 carbs) or his beef pot roast to make a dinner burrito (at [employer]J's)  Pesto over low carb pasta (bj's sells a good quality pesto in the center  refrigerated section of the deli   Try satueeing  Cheral Marker with mushroooms as a good side   [employer] Giant makes a mashed cauliflower that tastes like mashed potatoes  Whole wheat pasta is still full of digestible carbs and  Not as low in glycemic index as Dreamfield's.   Brown rice is still rice,  So skip the rice and noodles if you eat [place] or [place] (or at least limit to 1/2 cup)  9 PM snack :   Breyer's "low carb" fudgsicle or  ice cream bar (Carb Smart line), or  Weight Watcher's ice cream bar , or another "no sugar added" ice cream;  a serving of fresh berries/cherries with whipped cream   Cheese or DANNON'S LlGHT N FIT GREEK YOGURT  8 ounces of [place] unsweetened almond/cococunut milk    Treat yourself to a parfait made with whipped cream blueberiies, walnuts and vanilla greek yogurt  Avoid bananas, pineapple, grapes  and watermelon on a regular basis because they are high in sugar.  THINK OF THEM AS DESSERT  Remember that snack Substitutions should be less than 10 NET carbs per serving and meals < 20 carbs. Remember to subtract fiber grams to get the "net carbs."  @TULLOBREADPACKAGE @

## 2018-04-06 DIAGNOSIS — E559 Vitamin D deficiency, unspecified: Secondary | ICD-10-CM | POA: Insufficient documentation

## 2018-04-06 DIAGNOSIS — J40 Bronchitis, not specified as acute or chronic: Secondary | ICD-10-CM | POA: Insufficient documentation

## 2018-04-06 NOTE — Assessment & Plan Note (Signed)
Cough improving.She does not meet COVID 19 testing.  Improving.  No acute respiratory distress.  Patient will let me know if she does not continue to improve.

## 2018-04-06 NOTE — Assessment & Plan Note (Signed)
Fatigue is unchanged.  Patient and I discussed today that we suspect fatigue is multifactorial including deconditioning, lack of exercise, obesity.  She will start a walking program.  She will also schedule follow-up with Dr. Gardenia Phlegm whom she is established with in regards to this complaint as well to discuss whether imaging, physical therapy is an appropriate next step.

## 2018-04-06 NOTE — Assessment & Plan Note (Signed)
Patient declines starting statin therapy at this time.  We will continue to treat with lifestyle, primarily weight loss

## 2018-04-08 ENCOUNTER — Other Ambulatory Visit: Payer: Self-pay | Admitting: Family

## 2018-04-17 ENCOUNTER — Encounter: Payer: Self-pay | Admitting: Family Medicine

## 2018-04-17 ENCOUNTER — Ambulatory Visit (INDEPENDENT_AMBULATORY_CARE_PROVIDER_SITE_OTHER): Payer: Medicare Other | Admitting: Family Medicine

## 2018-04-17 DIAGNOSIS — M7061 Trochanteric bursitis, right hip: Secondary | ICD-10-CM

## 2018-04-17 MED ORDER — MELOXICAM 7.5 MG PO TABS: 7.5000 mg | ORAL_TABLET | Freq: Every day | ORAL | 0 refills | Status: DC

## 2018-04-17 NOTE — Progress Notes (Signed)
[facility] [place] [place], [place] [address] Phone: [phone] Subjective:     Virtual Visit via Video Note  I connected with Morgan Hurst on 04/17/18 at 10:45 AM EDT by a video enabled telemedicine application and verified that I am speaking with the correct person using two identifiers.   I discussed the limitations of evaluation and management by telemedicine and the availability of in person appointments. The patient expressed understanding and agreed to proceed.     I discussed the assessment and treatment plan with the patient. The patient was provided an opportunity to ask questions and all were answered. The patient agreed with the plan and demonstrated an understanding of the instructions.   The patient was advised to call back or seek an in-person evaluation if the symptoms worsen or if the condition fails to improve as anticipated.  I provided 27 minutes of non-face-to-face time during this encounter.  This included different treatment options as well as been being seen face-to-face for potential injection.  We discussed different medications and potential side effects of multiple medications as well.  We discussed when worsening symptoms and when to seek medical attention.   Lyndal Pulley, DO  More detailed note to follow below  CC: Hip pain  [id number]  Morgan Hurst is a 73 y.o. female coming in with complaint of right hip pain.  Had a very similar presentation previously in 2018 that responded well to a corticosteroid injection.  Pain seemed to start on the lateral aspect of the hip but now having bilateral leg pain.  Worse with increasing activity.  Rates the severity of pain is 9 out of 10.      Past Medical History:  Diagnosis Date  . Arthritis    lower back, right hip  . Borderline diabetes    PCP STARTED PT ON METFORMIN IN 2016 DUE TO ELEVATED GLUCOSE   . Complication of anesthesia    FIGHTING WHEN WAKING  UP FROM ANESTHESIA  . Diabetes mellitus without complication ([place])   . Endometriosis   . Heart murmur   . Orthodontics    top front 3 teeth caps and bridge   Past Surgical History:  Procedure Laterality Date  . ABDOMINAL HYSTERECTOMY  age 72  . ANKLE SURGERY    . CHOLECYSTECTOMY N/A 04/10/2015   Procedure: LAPAROSCOPIC CHOLECYSTECTOMY WITH INTRAOPERATIVE CHOLANGIOGRAM;  Surgeon: Robert Bellow, MD;  Location: [facility] ORS;  Service: General;  Laterality: N/A;  . COLONOSCOPY WITH PROPOFOL N/A 07/10/2017   Procedure: COLONOSCOPY WITH PROPOFOL;  Surgeon: Jonathon Bellows, MD;  Location[facility] END[facility]COPY;  Service: Gastroenterology;  Laterality: N/A;  . ESOPHAGOGASTRODUODENOSCOPY N/A 05/12/2015   Procedure: ESOPHAGOGASTRODUODENOSCOPY (EGD);  Surgeon: Hulen Luster, MD;  Location: [place];  Service: Gastroenterology;  Laterality: N/A;  . VAGINAL DELIVERY  2   Social History   Socioeconomic History  . Marital status: Widowed    Spouse name: Not on file  . Number of children: Not on file  . Years of education: Not on file  . Highest education level: Not on file  Occupational History  . Not on file  Social Needs  . Financial resource strain: Not hard at all  . Food insecurity:    Worry: Never true    Inability: Never true  . Transportation needs:    Medical: No    Non-medical: No  Tobacco Use  . Smoking status: Never Smoker  . Smokeless tobacco: Never Used  Substance  and Sexual Activity  . Alcohol use: No  . Drug use: No  . Sexual activity: Never  Lifestyle  . Physical activity:    Days per week: 0 days    Minutes per session: 0 min  . Stress: Not at all  Relationships  . Social connections:    Talks on phone: Not on file    Gets together: Not on file    Attends religious service: Not on file    Active member of club or organization: Not on file    Attends meetings of clubs or organizations: Not on file    Relationship status: Not on file  Other Topics Concern  . Not on  file  Social History Narrative   Lives in [place].       Husband passed away Mar 30, 2014 from PNA.      Work - retired from [place] - regular; working on [employer]   Exercise - walks occasionally, limited by fatigue. 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    No Known Allergies Family History  Problem Relation Age of Onset  . Diabetes Mother   . Cancer Mother        colon and breast  . Cancer Father        Leukemia  . Cancer Brother        colon and lung  . Cholelithiasis Daughter   . Heart disease Paternal Grandfather   . Cancer Other 29       colon    Current Outpatient Medications (Endocrine & Metabolic):  .  metFORMIN (GLUCOPHAGE) 500 MG tablet, Take 500 mg by mouth daily. Patient taking 2-3 times weekly.  Current Outpatient Medications (Cardiovascular):  .  colesevelam (WELCHOL) 625 MG tablet, Take 625 mg by mouth daily.   Current Outpatient Medications (Analgesics):  .  acetaminophen (TYLENOL) 325 MG tablet, Take by mouth. Marland Kitchen  aspirin 81 MG tablet, Take 81 mg by mouth daily. .  traMADol (ULTRAM) 50 MG tablet, TAKE 1 TABLET BY MOUTH DAILY AS NEEDED  Current Outpatient Medications (Hematological):  .  cyanocobalamin (,VITAMIN B-12,) 1000 MCG/ML injection, Inject into the muscle.  Current Outpatient Medications (Other):  Marland Kitchen  CALCIUM PO, Take by mouth. .  Cholecalciferol 1.25 MG (50000 UT) TABS, 50,000 units PO qwk for 8 weeks. Marland Kitchen  esomeprazole (NEXIUM) 40 MG capsule, Take by mouth. .  mometasone (ELOCON) 0.1 % lotion, APPLY 4  DROPS  TO EACH EAR CANAL HS ONCE PER WEEK AND PRF ITCHING AND DRY SKIN .  ondansetron (ZOFRAN) 4 MG tablet, Take 1 tablet (4 mg total) by mouth every 8 (eight) hours as needed for nausea or vomiting. Marland Kitchen  rOPINIRole (REQUIP) 0.25 MG tablet, TAKE 1 TABLET(0.25 MG) BY MOUTH AT BEDTIME AS NEEDED (Patient taking differently: Take 0.25 mg by mouth at bedtime. Patient taking 2-3 times weekly.) .  traZODone (DESYREL) 50 MG  tablet, Take 1 tablet (50 mg total) by mouth at bedtime. (Patient taking differently: Take 50 mg by mouth at bedtime. )    Past medical history, social, surgical and family history all reviewed in electronic medical record.  No pertanent information unless stated regarding to the chief complaint.   Review of Systems:  No headache, visual changes, nausea, vomiting, diarrhea, constipation, dizziness, abdominal pain, skin rash, fevers, chills, night sweats, weight loss, swollen lymph nodes, body aches, joint swelling,  chest pain, shortness of breath, mood changes.  Positive muscle aches  Objective  No vitals taken secondary to this being a visual virtual visit   General: No apparent distress alert and oriented x3 mood and affect normal, dressed appropriately.            Note: This dictation was prepared with Dragon dictation along with smaller phrase technology. Any transcriptional errors that result from this process are unintentional.

## 2018-04-17 NOTE — Patient Instructions (Signed)
Good to "see"you.  Ice 20 minutes 2 times daily. Usually after activity and before bed. Meloxicam daily for 10 days then as needed Try to stay active.  Stay off the celebrex for now I will send an invite for another Web-ex meeting on Monday to check in- if not better we will need to consider getting you in to do the injection

## 2018-04-17 NOTE — Assessment & Plan Note (Signed)
History of greater trochanteric bursitis.  Oral anti-inflammatory given at the moment but may need injection.  Differential includes the spinal stenosis and history of this as well.  We discussed different medications including gabapentin but patient has been noncompliant with certain medications in the past.  Discussed with patient in great length.  Patient wants to try the oral anti-inflammatories, icing regimen and home exercises that was emailed to her.  Patient will follow-up again in 6 days with another virtual visit.  Worsening symptoms will have to get patient in for potential procedure to alleviate some of the pain as well as further evaluation with physical exam.

## 2018-04-22 NOTE — Progress Notes (Signed)
[facility] [address] [place], [place] [address] Phone: [phone] Subjective:    Virtual Visit via Video Note  I connected with Morgan Hurst on 04/22/18 at  9:00 AM EDT by a video enabled telemedicine application and verified that I am speaking with the correct person using two identifiers.   I discussed the limitations of evaluation and management by telemedicine and the availability of in person appointments. The patient expressed understanding and agreed to proceed.     I discussed the assessment and treatment plan with the patient. The patient was provided an opportunity to ask questions and all were answered. The patient agreed with the plan and demonstrated an understanding of the instructions.   The patient was advised to call back or seek an in-person evaluation if the symptoms worsen or if the condition fails to improve as anticipated.   Lyndal Pulley, DO  More detailed note below  CC: Hip and back pain follow-up  [id number]  Morgan Hurst is a 73 y.o. female coming in with complaint of hip and back pain.  Known spinal stenosis but was having more pain in the greater trochanteric bursitis.  Patient did have a virtual visit last week.  Sent in prescription for oral anti-inflammatories patient was to do icing regimen and home exercises.  Patient states doing little better overall.  But still only about 5% better.  Still waking her up at night, making activities of daily living difficult.  Seems to be more on the lateral aspect of the hip.  Not as much as her back.  Seems on that her legs do seem to get weak little bit more frequently.    Past Medical History:  Diagnosis Date  . Arthritis    lower back, right hip  . Borderline diabetes    PCP STARTED PT ON METFORMIN IN 2016 DUE TO ELEVATED GLUCOSE   . Complication of anesthesia    FIGHTING WHEN WAKING UP FROM ANESTHESIA  . Diabetes mellitus without complication ([place])   .  Endometriosis   . Heart murmur   . Orthodontics    top front 3 teeth caps and bridge   Past Surgical History:  Procedure Laterality Date  . ABDOMINAL HYSTERECTOMY  age 33  . ANKLE SURGERY    . CHOLECYSTECTOMY N/A 04/10/2015   Procedure: LAPAROSCOPIC CHOLECYSTECTOMY WITH INTRAOPERATIVE CHOLANGIOGRAM;  Surgeon: Robert Bellow, MD;  Location: [facility] ORS;  Service: General;  Laterality: N/A;  . COLONOSCOPY WITH PROPOFOL N/A 07/10/2017   Procedure: COLONOSCOPY WITH PROPOFOL;  Surgeon: Jonathon Bellows, MD;  Location: [facility] ENDOSCOPY;  Service: Gastroenterology;  Laterality: N/A;  . ESOPHAGOGASTRODUODENOSCOPY N/A 05/12/2015   Procedure: ESOPHAGOGASTRODUODENOSCOPY (EGD);  Surgeon: Hulen Luster, MD;  Location: [place];  Service: Gastroenterology;  Laterality: N/A;  . VAGINAL DELIVERY  2   Social History   Socioeconomic History  . Marital status: Widowed    Spouse name: Not on file  . Number of children: Not on file  . Years of education: Not on file  . Highest education level: Not on file  Occupational History  . Not on file  Social Needs  . Financial resource strain: Not hard at all  . Food insecurity:    Worry: Never true    Inability: Never true  . Transportation needs:    Medical: No    Non-medical: No  Tobacco Use  . Smoking status: Never Smoker  . Smokeless tobacco: Never Used  Substance and Sexual Activity  .  Alcohol use: No  . Drug use: No  . Sexual activity: Never  Lifestyle  . Physical activity:    Days per week: 0 days    Minutes per session: 0 min  . Stress: Not at all  Relationships  . Social connections:    Talks on phone: Not on file    Gets together: Not on file    Attends religious service: Not on file    Active member of club or organization: Not on file    Attends meetings of clubs or organizations: Not on file    Relationship status: Not on file  Other Topics Concern  . Not on file  Social History Narrative   Lives in [place].       Husband  passed away April 09, 2014 from PNA.      Work - retired from [place] - regular; working on [employer]   Exercise - walks occasionally, limited by fatigue. 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    No Known Allergies Family History  Problem Relation Age of Onset  . Diabetes Mother   . Cancer Mother        colon and breast  . Cancer Father        Leukemia  . Cancer Brother        colon and lung  . Cholelithiasis Daughter   . Heart disease Paternal Grandfather   . Cancer Other 29       colon    Current Outpatient Medications (Endocrine & Metabolic):  .  metFORMIN (GLUCOPHAGE) 500 MG tablet, Take 500 mg by mouth daily. Patient taking 2-3 times weekly.  Current Outpatient Medications (Cardiovascular):  .  colesevelam (WELCHOL) 625 MG tablet, Take 625 mg by mouth daily.   Current Outpatient Medications (Analgesics):  .  acetaminophen (TYLENOL) 325 MG tablet, Take by mouth. Marland Kitchen  aspirin 81 MG tablet, Take 81 mg by mouth daily. .  meloxicam (MOBIC) 7.5 MG tablet, Take 1 tablet (7.5 mg total) by mouth daily. .  traMADol (ULTRAM) 50 MG tablet, TAKE 1 TABLET BY MOUTH DAILY AS NEEDED  Current Outpatient Medications (Hematological):  .  cyanocobalamin (,VITAMIN B-12,) 1000 MCG/ML injection, Inject into the muscle.  Current Outpatient Medications (Other):  Marland Kitchen  CALCIUM PO, Take by mouth. .  Cholecalciferol 1.25 MG (50000 UT) TABS, 50,000 units PO qwk for 8 weeks. Marland Kitchen  esomeprazole (NEXIUM) 40 MG capsule, Take by mouth. .  mometasone (ELOCON) 0.1 % lotion, APPLY 4  DROPS  TO EACH EAR CANAL HS ONCE PER WEEK AND PRF ITCHING AND DRY SKIN .  ondansetron (ZOFRAN) 4 MG tablet, Take 1 tablet (4 mg total) by mouth every 8 (eight) hours as needed for nausea or vomiting. Marland Kitchen  rOPINIRole (REQUIP) 0.25 MG tablet, TAKE 1 TABLET(0.25 MG) BY MOUTH AT BEDTIME AS NEEDED (Patient taking differently: Take 0.25 mg by mouth at bedtime. Patient taking 2-3 times weekly.) .  traZODone (DESYREL)  50 MG tablet, Take 1 tablet (50 mg total) by mouth at bedtime. (Patient taking differently: Take 50 mg by mouth at bedtime. )    Past medical history, social, surgical and family history all reviewed in electronic medical record.  No pertanent information unless stated regarding to the chief complaint.   Review of Systems:  No headache, visual changes, nausea, vomiting, diarrhea, constipation, dizziness, abdominal pain, skin rash, fevers, chills, night sweats, weight loss, swollen lymph nodes,chest pain, shortness of breath, mood changes.  Positive muscle aches, body aches  Objective  Virtual visit   General: No apparent distress alert and oriented x3 mood and affect normal, dressed appropriately.  HEENT: Pupils equal, extraocular movements intact      Impression and Recommendations:     . The above documentation has been reviewed and is accurate and complete Lyndal Pulley, DO       Note: This dictation was prepared with Dragon dictation along with smaller phrase technology. Any transcriptional errors that result from this process are unintentional.

## 2018-04-23 ENCOUNTER — Telehealth: Payer: Self-pay

## 2018-04-23 ENCOUNTER — Ambulatory Visit (INDEPENDENT_AMBULATORY_CARE_PROVIDER_SITE_OTHER): Payer: Medicare Other | Admitting: Family Medicine

## 2018-04-23 ENCOUNTER — Encounter: Payer: Self-pay | Admitting: Family Medicine

## 2018-04-23 DIAGNOSIS — M7061 Trochanteric bursitis, right hip: Secondary | ICD-10-CM | POA: Diagnosis not present

## 2018-04-23 NOTE — Assessment & Plan Note (Signed)
Hoping patient's pain is secondary to more of the greater trochanteric bursitis on the right side.  Only a 10 to 20% improvement with the medication that was sent in.  This time we will have patient come into office visit tomorrow and likely do an injection and see how patient does.  Think further evaluation with physical exam will be also helpful to differentiate between the spinal stenosis that could be contributing.

## 2018-04-23 NOTE — Progress Notes (Signed)
[facility] [place], [place] [address] Phone: [phone] Subjective:     CC: hip pain follow up   [id number]  Morgan Hurst is a 73 y.o. female coming in with complaint of left hip pain follow up left-sided hip pain.  Has been describing this for quite some time.  Not responding to the oral anti-inflammatories or icing regimen.  Worsening pain keeping her up at night, affecting daily activities, known lumbar radiculopathy as well.  Patient now states the pain is 9 out of 10.  Having difficulty even walking on a regular basis.  All pain though is on the lateral aspect.     Past Medical History:  Diagnosis Date  . Arthritis    lower back, right hip  . Borderline diabetes    PCP STARTED PT ON METFORMIN IN 03/25/2014 DUE TO ELEVATED GLUCOSE   . Complication of anesthesia    FIGHTING WHEN WAKING UP FROM ANESTHESIA  . Diabetes mellitus without complication ([place])   . Endometriosis   . Heart murmur   . Orthodontics    top front 3 teeth caps and bridge   Past Surgical History:  Procedure Laterality Date  . ABDOMINAL HYSTERECTOMY  age 74  . ANKLE SURGERY    . CHOLECYSTECTOMY N/A 04/10/2015   Procedure: LAPAROSCOPIC CHOLECYSTECTOMY WITH INTRAOPERATIVE CHOLANGIOGRAM;  Surgeon: Robert Bellow, MD;  Location: [facility] ORS;  Service: General;  Laterality: N/A;  . COLONOSCOPY WITH PROPOFOL N/A 07/10/2017   Procedure: COLONOSCOPY WITH PROPOFOL;  Surgeon: Jonathon Bellows, MD;  Location: [facility] ENDOSCOPY;  Service: Gastroenterology;  Laterality: N/A;  . ESOPHAGOGASTRODUODENOSCOPY N/A 05/12/2015   Procedure: ESOPHAGOGASTRODUODENOSCOPY (EGD);  Surgeon: Hulen Luster, MD;  Location: [place];  Service: Gastroenterology;  Laterality: N/A;  . VAGINAL DELIVERY  2   Social History   Socioeconomic History  . Marital status: Widowed    Spouse name: Not on file  . Number of children: Not on file  . Years of education: Not on file  . Highest education level:  Not on file  Occupational History  . Not on file  Social Needs  . Financial resource strain: Not hard at all  . Food insecurity:    Worry: Never true    Inability: Never true  . Transportation needs:    Medical: No    Non-medical: No  Tobacco Use  . Smoking status: Never Smoker  . Smokeless tobacco: Never Used  Substance and Sexual Activity  . Alcohol use: No  . Drug use: No  . Sexual activity: Never  Lifestyle  . Physical activity:    Days per week: 0 days    Minutes per session: 0 min  . Stress: Not at all  Relationships  . Social connections:    Talks on phone: Not on file    Gets together: Not on file    Attends religious service: Not on file    Active member of club or organization: Not on file    Attends meetings of clubs or organizations: Not on file    Relationship status: Not on file  Other Topics Concern  . Not on file  Social History Narrative   Lives in [place].       Husband passed away 03-25-14 from PNA.      Work - retired from [place] - regular; working on [employer]   Exercise - walks occasionally, limited by fatigue. Gardens and takes care  of her 46 year old grandchild.    No Known Allergies Family History  Problem Relation Age of Onset  . Diabetes Mother   . Cancer Mother        colon and breast  . Cancer Father        Leukemia  . Cancer Brother        colon and lung  . Cholelithiasis Daughter   . Heart disease Paternal Grandfather   . Cancer Other 29       colon    Current Outpatient Medications (Endocrine & Metabolic):  .  metFORMIN (GLUCOPHAGE) 500 MG tablet, Take 500 mg by mouth daily. Patient taking 2-3 times weekly.  Current Outpatient Medications (Cardiovascular):  .  colesevelam (WELCHOL) 625 MG tablet, Take 625 mg by mouth daily.   Current Outpatient Medications (Analgesics):  .  acetaminophen (TYLENOL) 325 MG tablet, Take by mouth. Marland Kitchen  aspirin 81 MG tablet, Take 81 mg by mouth daily. .  meloxicam (MOBIC)  7.5 MG tablet, Take 1 tablet (7.5 mg total) by mouth daily. .  traMADol (ULTRAM) 50 MG tablet, TAKE 1 TABLET BY MOUTH DAILY AS NEEDED  Current Outpatient Medications (Hematological):  .  cyanocobalamin (,VITAMIN B-12,) 1000 MCG/ML injection, Inject into the muscle.  Current Outpatient Medications (Other):  Marland Kitchen  CALCIUM PO, Take by mouth. .  Cholecalciferol 1.25 MG (50000 UT) TABS, 50,000 units PO qwk for 8 weeks. Marland Kitchen  esomeprazole (NEXIUM) 40 MG capsule, Take by mouth. .  mometasone (ELOCON) 0.1 % lotion, APPLY 4  DROPS  TO EACH EAR CANAL HS ONCE PER WEEK AND PRF ITCHING AND DRY SKIN .  ondansetron (ZOFRAN) 4 MG tablet, Take 1 tablet (4 mg total) by mouth every 8 (eight) hours as needed for nausea or vomiting. Marland Kitchen  rOPINIRole (REQUIP) 0.25 MG tablet, TAKE 1 TABLET(0.25 MG) BY MOUTH AT BEDTIME AS NEEDED (Patient taking differently: Take 0.25 mg by mouth at bedtime. Patient taking 2-3 times weekly.) .  traZODone (DESYREL) 50 MG tablet, Take 1 tablet (50 mg total) by mouth at bedtime. (Patient taking differently: Take 50 mg by mouth at bedtime. )    Past medical history, social, surgical and family history all reviewed in electronic medical record.  No pertanent information unless stated regarding to the chief complaint.   Review of Systems:  No headache, visual changes, nausea, vomiting, diarrhea, constipation, dizziness, abdominal pain, skin rash, fevers, chills, night sweats, weight loss, swollen lymph nodes, body aches, joint swelling,  chest pain, shortness of breath, mood changes.  Positive muscle aches  Objective  Blood pressure 118/70, pulse 78, height 5' 1"  (1.549 m), weight 232 lb (105.2 kg), SpO2 99 %.    General: No apparent distress alert and oriented x3 mood and affect normal, dressed appropriately.  Overweight HEENT: Pupils equal, extraocular movements intact  Respiratory: Patient's speak in full sentences and does not appear short of breath  Cardiovascular: No lower extremity  edema, non tender, no erythema  Skin: Warm dry intact with no signs of infection or rash on extremities or on axial skeleton.  Abdomen: Soft nontender  Neuro: Cranial nerves II through XII are intact, neurovascularly intact in all extremities with 2+ DTRs and 2+ pulses.  Lymph: No lymphadenopathy of posterior or anterior cervical chain or axillae bilaterally.  Gait severely antalgic MSK:  tender with limited range of motion and good stability and symmetric strength and tone of shoulders, elbows, wrist, knee and ankles bilaterally.   Left hip exam has decreased range of motion  in all planes.  Severe tenderness over the lateral aspect.  Mild increase in discomfort with straight leg test but still in the lateral aspect.  Mild going down the leg.   Procedure: Real-time Ultrasound Guided Injection of left  greater trochanteric bursitis secondary to patient's body habitus Device: GE Logiq Q7  Ultrasound guided injection is preferred based studies that show increased duration, increased effect, greater accuracy, decreased procedural pain, increased response rate, and decreased cost with ultrasound guided versus blind injection.  Verbal informed consent obtained.  Time-out conducted.  Noted no overlying erythema, induration, or other signs of local infection.  Skin prepped in a sterile fashion.  Local anesthesia: Topical Ethyl chloride.  With sterile technique and under real time ultrasound guidance:  Greater trochanteric area was visualized and patient's bursa was noted. A 22-gauge 3 inch needle was inserted and 4 cc of 0.5% Marcaine and 1 cc of Kenalog 40 mg/dL was injected. Pictures taken Completed without difficulty  Pain immediately resolved suggesting accurate placement of the medication.  Advised to call if fevers/chills, erythema, induration, drainage, or persistent bleeding.  Images permanently stored and available for review in the ultrasound unit.  Impression: Technically successful  ultrasound guided injection.     Impression and Recommendations:     This case required medical decision making of moderate complexity. The above documentation has been reviewed and is accurate and complete Lyndal Pulley, DO       Note: This dictation was prepared with Dragon dictation along with smaller phrase technology. Any transcriptional errors that result from this process are unintentional.

## 2018-04-23 NOTE — Telephone Encounter (Signed)
Pt called to cancel her appt for B-12 injection.  Pt would like to know if PCP could call in B-12 shots to pharmacy so she can take them at home instead of coming into the office right now to avoid exposure to coronavirus.  Pt said that she has 2 family members that can give her the injections at home.  CB#:  [phone] (cell) if you need to speak to pt.

## 2018-04-24 ENCOUNTER — Ambulatory Visit: Payer: Medicare Other | Admitting: Family Medicine

## 2018-04-24 ENCOUNTER — Ambulatory Visit: Payer: Medicare Other

## 2018-04-24 ENCOUNTER — Ambulatory Visit: Payer: Self-pay

## 2018-04-24 ENCOUNTER — Other Ambulatory Visit: Payer: Self-pay

## 2018-04-24 VITALS — BP 118/70 | HR 78 | Ht 61.0 in | Wt 232.0 lb

## 2018-04-24 DIAGNOSIS — M25551 Pain in right hip: Secondary | ICD-10-CM

## 2018-04-24 DIAGNOSIS — M7062 Trochanteric bursitis, left hip: Secondary | ICD-10-CM

## 2018-04-24 NOTE — Assessment & Plan Note (Addendum)
Patient has been doing relatively well since then to the injection.  Discussed which activities of doing which wants to avoid.  Discussed icing regimen and home exercise.  Discussed which activities to potentially avoid.  We discussed differential includes lumbar radiculopathy and would maybe need to consider possible epidural if this does not make significant improvement.  Patient already was walking better noted as well.  Follow-up again 6 weeks

## 2018-04-24 NOTE — Patient Instructions (Addendum)
Good to see you  Injected the side of the hip and really think it will improve quickly Ice 20 minutes 2 times daily. Usually after activity and before bed. Exercises 3 times a week.  Stay active Wear good shoes Send a message Monday to tell me how you are doing.

## 2018-04-25 MED ORDER — CYANOCOBALAMIN 1000 MCG/ML IJ SOLN: INTRAMUSCULAR | 15 refills | Status: DC

## 2018-04-25 NOTE — Telephone Encounter (Signed)
I spoke with patient & she will pick up from her pharmacy.

## 2018-04-25 NOTE — Telephone Encounter (Signed)
Call pt Sent her in B12 which she can inject at home

## 2018-04-25 NOTE — Addendum Note (Signed)
Addended by: Burnard Hawthorne on: 04/25/2018 01:04 PM   Modules accepted: Orders

## 2018-05-24 ENCOUNTER — Other Ambulatory Visit: Payer: Self-pay

## 2018-05-24 MED ORDER — MELOXICAM 7.5 MG PO TABS: 7.5000 mg | ORAL_TABLET | Freq: Every day | ORAL | 0 refills | Status: DC

## 2018-07-07 ENCOUNTER — Other Ambulatory Visit: Payer: Self-pay | Admitting: Family

## 2018-07-07 DIAGNOSIS — R252 Cramp and spasm: Secondary | ICD-10-CM

## 2018-07-07 DIAGNOSIS — M48062 Spinal stenosis, lumbar region with neurogenic claudication: Secondary | ICD-10-CM

## 2018-07-11 ENCOUNTER — Other Ambulatory Visit: Payer: Self-pay

## 2018-07-11 NOTE — Telephone Encounter (Signed)
I tried to call patient on both available numbers & was unable to reach.

## 2018-07-11 NOTE — Telephone Encounter (Signed)
Pt returning call to Judson Roch

## 2018-07-11 NOTE — Telephone Encounter (Signed)
Call pt Yes please discontinue the megadose vitamin D.  She may take the following  Vitamin D is ONLY slightly low. Please start cholecalciferol 800 units daily for next 3 to 4 months. You may find this over the counter in the drug store. Please call to make an appointment for 3 to 4 months out so we can recheck.   Also, please ensure you are following a diet high in calcium -- research shows better outcomes with dietary sources including kale, yogurt, broccolii, cheese, okra, almonds- to name a few.    Refilled tramadol  She is due for follow-up, last seen in March.  Please remind her she is to be seen every 3 months for refills of tramadol  I looked up patient on  Controlled Substances Reporting System and saw no activity that raised concern of inappropriate use.

## 2018-07-17 ENCOUNTER — Other Ambulatory Visit: Payer: Self-pay

## 2018-07-17 MED ORDER — MELOXICAM 7.5 MG PO TABS: 7.5000 mg | ORAL_TABLET | Freq: Every day | ORAL | 0 refills | Status: DC

## 2018-07-17 NOTE — Progress Notes (Unsigned)
Paper script sent to office. Rx filled through chart per a verbal from Dr. [name] for Mobic.

## 2018-08-20 ENCOUNTER — Other Ambulatory Visit: Payer: Self-pay

## 2018-08-20 MED ORDER — MELOXICAM 7.5 MG PO TABS: 7.5000 mg | ORAL_TABLET | Freq: Every day | ORAL | 0 refills | Status: DC

## 2018-08-20 NOTE — Progress Notes (Unsigned)
Rx sent in from paper script.

## 2018-08-24 ENCOUNTER — Ambulatory Visit (INDEPENDENT_AMBULATORY_CARE_PROVIDER_SITE_OTHER): Payer: Medicare Other | Admitting: Family

## 2018-08-24 ENCOUNTER — Other Ambulatory Visit: Payer: Self-pay

## 2018-08-24 ENCOUNTER — Encounter: Payer: Self-pay | Admitting: Family

## 2018-08-24 ENCOUNTER — Ambulatory Visit: Payer: Medicare Other

## 2018-08-24 ENCOUNTER — Telehealth: Payer: Self-pay

## 2018-08-24 DIAGNOSIS — Z0000 Encounter for general adult medical examination without abnormal findings: Secondary | ICD-10-CM

## 2018-08-24 DIAGNOSIS — R531 Weakness: Secondary | ICD-10-CM

## 2018-08-24 DIAGNOSIS — Z1239 Encounter for other screening for malignant neoplasm of breast: Secondary | ICD-10-CM

## 2018-08-24 DIAGNOSIS — Z13820 Encounter for screening for osteoporosis: Secondary | ICD-10-CM

## 2018-08-24 DIAGNOSIS — R29898 Other symptoms and signs involving the musculoskeletal system: Secondary | ICD-10-CM | POA: Diagnosis not present

## 2018-08-24 NOTE — Assessment & Plan Note (Addendum)
Suspect related to deconditioning and chronic low back , hip pain. No radicular symptoms. ? PVD ? Intermittent claudication Doesn't appear to be restless leg.  She declines PT or imaging at this time. She prefers to follow with [facility] Phlegm. Asked nurse to call patient in regards to vascular consult.  Will follow.

## 2018-08-24 NOTE — Patient Instructions (Addendum)
  Ms. Hileman , Thank you for taking time to come for your Medicare Wellness Visit. I appreciate your ongoing commitment to your health goals. Please review the following plan we discussed and let me know if I can assist you in the future.   These are the goals we discussed: Goals    . Healthy Lifestyle     Continue making healthy meal choices.  Low carb foods. Stay hydrated and drink plenty of water. Stay active.  Stretch.  Daily chair exercises when unable to walk.        This is a list of the screening recommended for you and due dates:  Health Maintenance  Topic Date Due  . Complete foot exam   06/05/1955  . Mammogram  06/23/2015  . Eye exam for diabetics  08/18/2015  . Urine Protein Check  02/21/2017  . Hemoglobin A1C  06/26/2018  . Flu Shot  08/18/2018  . Tetanus Vaccine  12/26/2019  . Colon Cancer Screening  07/11/2022  . DEXA scan (bone density measurement)  Completed  .  Hepatitis C: One time screening is recommended by Center for Disease Control  (CDC) for  adults born from [birth year] through 1964.   Completed  . Pneumonia vaccines  Completed

## 2018-08-24 NOTE — Telephone Encounter (Signed)
I spoke with patient & she stated that she would like to see Dr. [name] in [place] first. She has so many doctors that she prefers to see one at time. She stated that she hadn't thought of vascular & will call back if she wants referral placed. She has been scheduled for fasting labs.

## 2018-08-24 NOTE — Progress Notes (Signed)
Subjective:   Morgan Hurst is a 73 y.o. female who presents for Medicare Annual (Subsequent) preventive examination.  Review of Systems:  No ROS.  Medicare Wellness Virtual Visit.  Visual/audio telehealth visit, UTA vital signs.   See social history for additional risk factors.   Cardiac Risk Factors include: advanced age (>53mn, >>81women);diabetes mellitus;hypertension     Objective:     Vitals: There were no vitals taken for this visit.  There is no height or weight on file to calculate BMI.  Advanced Directives 08/24/2018 08/22/2017 07/10/2017 08/11/2016 08/12/2015 05/12/2015  Does Patient Have a Medical Advance Directive? Yes Yes Yes Yes Yes Yes  Type of AParamed[place]of [place]herry [place] will [place] - [place] will - Living will;Healthcare Power of Attorney  Does patient want to make changes to medical advance directive? No - Patient declined No - Patient declined - No - Patient declined - -  Copy of HHillsboroin Chart? No - copy requested No - copy requested - No - copy requested No - copy requested No - copy requested    Tobacco Social History   Tobacco Use  Smoking Status Never Smoker  Smokeless Tobacco Never Used     Counseling given: Not Answered   Clinical Intake:  Pre-visit preparation completed: Yes        Diabetes: Yes(Followed by pcp)  How often do you need to have someone help you when you read instructions, pamphlets, or other written materials from your doctor or pharmacy?: 1 - Never  Interpreter Needed?: No     Past Medical History:  Diagnosis Date  . Arthritis    lower back, right hip  . Borderline diabetes    PCP STARTED PT ON METFORMIN IN 2016 DUE TO ELEVATED GLUCOSE   . Complication of anesthesia    FIGHTING WHEN WAKING UP FROM ANESTHESIA  . Diabetes mellitus [place] complication [place]   . Endometriosis   . Heart murmur   .  Orthodontics    top front 3 teeth caps and bridge   Past Surgical History:  Procedure Laterality Date  . ABDOMINAL HYSTERECTOMY  age 73 . ANKLE SURGERY    . CHOLECYSTECTOMY N/A 04/10/2015   Procedure: LAPAROSCOPIC CHOLECYSTECTOMY WITH INTRAOPERATIVE CHOLANGIOGRAM;  Surgeon: JRobert Bellow MD;  Location: [facility] ORS;  Service: General;  Laterality: N/A;  . COLONOSCOPY WITH PROPOFOL N/A 07/10/2017   Procedure: COLONOSCOPY WITH PROPOFOL;  Surgeon: AJonathon Bellows MD;  [facility];  Service: Gastroenterology;  Laterality: N/A;  . ESOPHAGOGASTRODUODENOSCOPY N/A 05/12/2015   Procedure: ESOPHAGOGASTRODUODENOSCOPY (EGD);  Surgeon: PHulen Luster MD;  [facility]: [facility]  Service: Gastroenterology;  Laterality: N/A;  . VAGINAL DELIVERY  2   Family History  Problem Relation Age of Onset  . Diabetes Mother   . Cancer Mother        colon and breast  . Cancer Father        Leukemia  . Cancer Brother        colon and lung  . Cholelithiasis Daughter   . Heart disease Paternal Grandfather   . Cancer Other 29       colon   Social History   Socioeconomic History  . Marital status: Widowed    Spouse name: Not on file  . Number of children: Not on file  . Years of education: Not on file  . Highest education level: Not on file  Occupational History  .  Not on file  Social Needs  . Financial resource strain: Not hard at all  . Food insecurity    Worry: Never true    Inability: Never true  . Transportation needs    Medical: No    Non-medical: No  Tobacco Use  . Smoking status: Never Smoker  . Smokeless tobacco: Never Used  Substance and Sexual Activity  . Alcohol use: No  . Drug use: No  . Sexual activity: Never  Lifestyle  . Physical activity    Days per week: 0 days    Minutes per session: 0 min  . Stress: Not at all  Relationships  . Social Herbalist on phone: Not on file    Gets together: Not on file    Attends religious service: Not on file     Active member of club or organization: Not on file    Attends meetings of clubs or organizations: Not on file    Relationship status: Not on file  Other Topics Concern  . Not on file  Social History Narrative   Lives in [place].       Husband passed away 03/28/2014 from PNA.      Work - retired from [employer] - regular; working on [employer]   Exercise - walks occasionally, limited by fatigue. [place] and takes care of her 92 year old grandchild.     Outpatient Encounter Medications as of 08/24/2018  Medication Sig  . aspirin 81 MG tablet Take 81 mg by mouth daily.  Marland Kitchen CALCIUM PO Take by mouth.  . colesevelam (WELCHOL) 625 MG tablet Take 625 mg by mouth daily.  . cyanocobalamin (,VITAMIN B-12,) 1000 MCG/ML injection 1000 mcg injection once per month.  . esomeprazole (NEXIUM) 40 MG capsule Take by mouth.  . meloxicam (MOBIC) 7.5 MG tablet Take 1 tablet (7.5 mg total) by mouth daily.  . metFORMIN (GLUCOPHAGE) 500 MG tablet Take 500 mg by mouth daily. Patient taking 2-3 times weekly.  . ondansetron (ZOFRAN) 4 MG tablet Take 1 tablet (4 mg total) by mouth every 8 (eight) hours as needed for nausea or vomiting.  . traMADol (ULTRAM) 50 MG tablet TAKE 1 TABLET BY MOUTH DAILY AS NEEDED  . traZODone (DESYREL) 50 MG tablet Take 1 tablet (50 mg total) by mouth at bedtime. (Patient taking differently: Take 50 mg by mouth at bedtime. )  . mometasone (ELOCON) 0.1 % lotion APPLY 4  DROPS  TO EACH EAR CANAL HS ONCE PER WEEK AND PRF ITCHING AND DRY SKIN  . rOPINIRole (REQUIP) 0.25 MG tablet TAKE 1 TABLET(0.25 MG) BY MOUTH AT BEDTIME AS NEEDED  . [DISCONTINUED] acetaminophen (TYLENOL) 325 MG tablet Take by mouth.   No facility-administered encounter medications on file as of 08/24/2018.     Activities of Daily Living In your present state of health, do you have any difficulty performing the following activities: 08/24/2018  Hearing? N  Vision? N  Difficulty concentrating or making  decisions? N  Walking or climbing stairs? N  Dressing or bathing? N  Doing errands, shopping? N  Preparing Food and eating ? N  Using the Toilet? N  In the past six months, have you accidently leaked urine? N  Do you have problems with loss of bowel control? N  Managing your Medications? N  Managing your Finances? N  Housekeeping or managing your Housekeeping? N  Some recent data might be hidden    Patient Care  Team: Burnard Hawthorne, FNP as PCP - General (Family Medicine) Jackolyn Confer, MD (Internal Medicine) Bary Castilla Forest Gleason, MD (General Surgery)    Assessment:   This is a routine wellness examination for Tynia.  I connected with patient 08/24/18 at 10:00 AM EDT by an audio enabled telemedicine application and verified that I am speaking with the correct person using two identifiers. Patient stated full name and DOB. Patient gave permission to continue with virtual visit. Patient's location was at home and Nurse's location was at [place] office.   Health Screenings  Mammogram - 06/2014 Colonoscopy - 06/2017 Bone Density - 08/2015 Glaucoma -none Hepatitis C Screening- 02/2016 Hearing -demonstrates normal hearing during visit. Hemoglobin A1C - 12/2017 (5.9) Foot exam- she denies walking around bare foot, numbness, tingling, wounds. Followed by pcp. Cholesterol - 03/2018 Dental- UTD Vision- UTD  Social  Alcohol intake - no         Smoking history- never    Smokers in home? none Illicit drug use? none Physical activity- no routine Diet - regular Sexually Active -never BMI- discussed the importance of a healthy diet, water intake and the benefits of aerobic exercise.  Educational material provided.   Safety  Patient feels safe at home- yes Patient does have smoke detectors at home- yes Patient does wear sunscreen or protective clothing when in direct sunlight -yes Patient does wear seat belt when in a moving vehicle -yes  Covid-19 precautions and sickness  symptoms discussed.   Activities of Daily Living Patient denies needing assistance with: driving, household chores, feeding themselves, getting from bed to chair, getting to the toilet, bathing/showering, dressing, managing money, or preparing meals.  Her children live one block away and will assist as needed with any activity.   Depression Screen Patient denies losing interest in daily life, feeling hopeless, or crying easily over simple problems.   Medication-taking as directed and without issues.   Fall Screen Patient denies being afraid of falling or falling in the last year.   Memory Screen Patient is alert.  Correctly identified the president of the [place], season and recall 3/3.  Immunizations The following Immunizations were discussed: Influenza, shingles, pneumonia, and tetanus.   Other Providers Patient Care Team: Burnard Hawthorne, FNP as PCP - General (Family Medicine) Jackolyn Confer, MD (Internal Medicine) Bary Castilla Forest Gleason, MD (General Surgery)   Exercise Activities and Dietary recommendations Current Exercise Habits: The patient does not participate in regular exercise at present  Goals    . Healthy Lifestyle     Continue making healthy meal choices.  Low carb foods. Stay hydrated and drink plenty of water. Stay active.  Stretch.  Daily chair exercises when unable to walk.        Fall Risk Fall Risk  08/24/2018 08/22/2017 10/13/2016 08/11/2016 05/24/2016  Falls in the past year? 0 No No Yes No   Is the patient's home free of loose throw rugs in walkways, pet beds, electrical cords, etc? yes      Grab bars in the bathroom? yes      Handrails on the stairs? yes        Adequate lighting? yes     Depression Screen PHQ 2/9 Scores 08/24/2018 12/25/2017 08/22/2017 10/13/2016  PHQ - 2 Score 0 1 0 0  PHQ- 9 Score - 8 - -     Cognitive Function MMSE - Mini Mental State Exam 08/22/2017 08/11/2016 08/12/2015  Orientation to time 5 5 5   Orientation to Place 5 5 5  Registration 3 3 3   Attention/ Calculation 5 5 5   Recall 3 3 3   Language- name 2 objects 2 2 2   Language- repeat 1 1 1   Language- follow 3 step command 3 3 3   Language- read & follow direction 1 1 1   Write a sentence 1 1 1   Copy design 1 1 1   Total score 30 30 30      6CIT Screen 08/24/2018  What Year? 0 points  What month? 0 points  What time? 0 points  Count back from 20 0 points  Months in reverse 0 points  Repeat phrase 0 points  Total Score 0    Immunization History  Administered Date(s) Administered  . Influenza Split 12/26/2011  . Influenza, High Dose Seasonal PF 11/25/2015, 10/13/2016, 09/26/2017  . Influenza,inj,Quad PF,6+ Mos 12/25/2012, 03/18/2014, 10/07/2014  . Pneumococcal Conjugate-13 03/18/2014  . Pneumococcal Polysaccharide-23 12/26/2011  . Tdap 12/25/2009  . Zoster 12/26/2010    Screening Tests Health Maintenance  Topic Date Due  . FOOT EXAM  06/05/1955  . MAMMOGRAM  06/23/2015  . OPHTHALMOLOGY EXAM  08/18/2015  . URINE MICROALBUMIN  02/21/2017  . HEMOGLOBIN A1C  06/26/2018  . INFLUENZA VACCINE  08/18/2018  . TETANUS/TDAP  12/26/2019  . COLONOSCOPY  07/11/2022  . DEXA SCAN  Completed  . Hepatitis C Screening  Completed  . PNA vac Low Risk Adult  Completed      Plan:    End of life planning; Advance aging; Advanced directives discussed.  Copy of current HCPOA/Living Will requested.    I have personally reviewed and noted the following in the patient's chart:   . Medical and social history . Use of alcohol, tobacco or illicit drugs  . Current medications and supplements . Functional ability and status . Nutritional status . Physical activity . Advanced directives . List of other physicians . Hospitalizations, surgeries, and ER visits in previous 12 months . Vitals . Screenings to include cognitive, depression, and falls . Referrals and appointments  In addition, I have reviewed and discussed with patient certain preventive protocols,  quality metrics, and best practice recommendations. A written personalized care plan for preventive services as well as general preventive health recommendations were provided to patient.     Varney Biles, LPN  09/18/6943   Agree with plan. Mable Paris, NP

## 2018-08-24 NOTE — Patient Instructions (Addendum)
Please call call and schedule your 3D mammogram, bone density scan as discussed.   [place]  [address] [place], [place]    I would still follow up with Dr [name].   Labs as scheduled.   Please ensure that you have annual eye exam  Please also call Dr [facility]ia Phlegm in regards to hip pain and leg weakness. I would advise imaging and physical therapy.   Let me know if you need anything at all from my end.   Stay safe!

## 2018-08-24 NOTE — Progress Notes (Signed)
Verbal consent for services obtained from patient prior to services given to TELEPHONE visit:   Location of call:  provider at work patient at home  Names of all persons present for services: Mable Paris, NP Chief complaint:   Legs 'continue to be weak' and she cannot walk long distances such as when shopping. This about the same.  Feels hard to go up stairs. Describes an 'ache'. Able to walk at home. Able to take care of ADLs at home.  Improves with rest.   Complains of pain in right and left hip, lateral and low lumbar back. No groin pain or numbness. No numbness in legs. No falls. No trouble with balance. No trouble with urinating , or trouble with bowels.  Denies exertional chest pain or pressure, numbness or tingling radiating to left arm or jaw, palpitations, dizziness, orthopnea, frequent headaches, changes in vision,leg swelling, or shortness of breath.   No h/o cancer.   No formal exercise especially due to weakness, feels may exercise less.   Tried PT in the past' declines due to cost.   [facility] Phlegm- right hip pain 04/2018. May consider epidural.  In the past, has good relief from injections with Dr [name].   Has periodic cramps in legs- take requip prn. Legs are not moving at night.   2016 - right hip- degenerative changes 2015-lumbar degenerative changes.   Prediabetic - on metformin past 2 weeks; had come off.   Parachos- 01/2018, reviewed note.    History, background, results pertinent:   Never smoker  A/P/next steps:  Problem List Items Addressed This Visit      Nervous and Auditory   Weakness of both lower extremities    Suspect related to deconditioning and chronic low back , hip pain. No radicular symptoms. ? PVD ? Intermittent claudication Doesn't appear to be restless leg.  She declines PT or imaging at this time. She prefers to follow with [facility] Phlegm. Asked nurse to call patient in regards to vascular consult.  Will follow.        Other Visit  Diagnoses    Weakness    -  Primary   Relevant Orders   Hemoglobin A1c   VITAMIN D 25 Hydroxy (Vit-D Deficiency, Fractures)   Microalbumin / creatinine urine ratio   CBC with Differential/Platelet   Screening for osteoporosis       Relevant Orders   DG Bone Density   Screening for breast cancer       Relevant Orders   MM 3D SCREEN BREAST BILATERAL    Of note, patient will schedule mammogram, bone density.   I spent 25 min  discussing plan of care over the phone.

## 2018-08-27 NOTE — Telephone Encounter (Signed)
noted 

## 2018-09-04 ENCOUNTER — Other Ambulatory Visit: Payer: Self-pay

## 2018-09-04 ENCOUNTER — Other Ambulatory Visit (INDEPENDENT_AMBULATORY_CARE_PROVIDER_SITE_OTHER): Payer: Medicare Other

## 2018-09-04 DIAGNOSIS — R531 Weakness: Secondary | ICD-10-CM

## 2018-09-04 LAB — CBC WITH DIFFERENTIAL/PLATELET
Basophils Absolute: 0.1 10*3/uL (ref 0.0–0.1)
Basophils Relative: 1 % (ref 0.0–3.0)
Eosinophils Absolute: 0.4 10*3/uL (ref 0.0–0.7)
Eosinophils Relative: 4.3 % (ref 0.0–5.0)
HCT: 43.6 % (ref 36.0–46.0)
Hemoglobin: 14.3 g/dL (ref 12.0–15.0)
Lymphocytes Relative: 26.6 % (ref 12.0–46.0)
Lymphs Abs: 2.2 10*3/uL (ref 0.7–4.0)
MCHC: 32.8 g/dL (ref 30.0–36.0)
MCV: 88.8 fl (ref 78.0–100.0)
Monocytes Absolute: 0.8 10*3/uL (ref 0.1–1.0)
Monocytes Relative: 9.7 % (ref 3.0–12.0)
Neutro Abs: 4.9 10*3/uL (ref 1.4–7.7)
Neutrophils Relative %: 58.4 % (ref 43.0–77.0)
Platelets: 188 10*3/uL (ref 150.0–400.0)
RBC: 4.91 Mil/uL (ref 3.87–5.11)
RDW: 14.4 % (ref 11.5–15.5)
WBC: 8.4 10*3/uL (ref 4.0–10.5)

## 2018-09-04 LAB — HEMOGLOBIN A1C: Hgb A1c MFr Bld: 6 % (ref 4.6–6.5)

## 2018-09-04 LAB — VITAMIN D 25 HYDROXY (VIT D DEFICIENCY, FRACTURES): VITD: 35.49 ng/mL (ref 30.00–100.00)

## 2018-09-04 LAB — MICROALBUMIN / CREATININE URINE RATIO
Creatinine,U: 122 mg/dL
Microalb Creat Ratio: 1.5 mg/g (ref 0.0–30.0)
Microalb, Ur: 1.8 mg/dL (ref 0.0–1.9)

## 2018-09-07 ENCOUNTER — Other Ambulatory Visit (INDEPENDENT_AMBULATORY_CARE_PROVIDER_SITE_OTHER): Payer: Medicare Other

## 2018-09-07 ENCOUNTER — Encounter: Payer: Self-pay | Admitting: Family Medicine

## 2018-09-07 ENCOUNTER — Other Ambulatory Visit: Payer: Self-pay

## 2018-09-07 ENCOUNTER — Ambulatory Visit (INDEPENDENT_AMBULATORY_CARE_PROVIDER_SITE_OTHER)
Admission: RE | Admit: 2018-09-07 | Discharge: 2018-09-07 | Disposition: A | Discharge status: Home / Self Care | Payer: Medicare Other | Source: Ambulatory Visit | Attending: Family Medicine | Admitting: Family Medicine

## 2018-09-07 ENCOUNTER — Ambulatory Visit (INDEPENDENT_AMBULATORY_CARE_PROVIDER_SITE_OTHER): Payer: Medicare Other | Admitting: Family Medicine

## 2018-09-07 VITALS — BP 140/80 | HR 79 | Ht 61.0 in | Wt 231.0 lb

## 2018-09-07 DIAGNOSIS — G8929 Other chronic pain: Secondary | ICD-10-CM | POA: Diagnosis not present

## 2018-09-07 DIAGNOSIS — M545 Low back pain, unspecified: Secondary | ICD-10-CM

## 2018-09-07 DIAGNOSIS — M48062 Spinal stenosis, lumbar region with neurogenic claudication: Secondary | ICD-10-CM

## 2018-09-07 DIAGNOSIS — R0602 Shortness of breath: Secondary | ICD-10-CM | POA: Diagnosis not present

## 2018-09-07 DIAGNOSIS — M2550 Pain in unspecified joint: Secondary | ICD-10-CM

## 2018-09-07 LAB — COMPREHENSIVE METABOLIC PANEL
ALT: 26 U/L (ref 0–35)
AST: 26 U/L (ref 0–37)
Albumin: 4.2 g/dL (ref 3.5–5.2)
Alkaline Phosphatase: 73 U/L (ref 39–117)
BUN: 13 mg/dL (ref 6–23)
CO2: 25 mEq/L (ref 19–32)
Calcium: 9.5 mg/dL (ref 8.4–10.5)
Chloride: 105 mEq/L (ref 96–112)
Creatinine, Ser: 0.6 mg/dL (ref 0.40–1.20)
GFR: 98.2 mL/min (ref >60.00)
Glucose, Bld: 112 mg/dL — ABNORMAL HIGH (ref 70–99)
Potassium: 4.1 mEq/L (ref 3.5–5.1)
Sodium: 140 mEq/L (ref 135–145)
Total Bilirubin: 0.4 mg/dL (ref 0.2–1.2)
Total Protein: 7.5 g/dL (ref 6.0–8.3)

## 2018-09-07 LAB — BRAIN NATRIURETIC PEPTIDE: Pro B Natriuretic peptide (BNP): 32 pg/mL (ref 0.0–100.0)

## 2018-09-07 LAB — IBC PANEL
Iron: 59 ug/dL (ref 42–145)
Saturation Ratios: 15.4 % — ABNORMAL LOW (ref 20.0–50.0)
Transferrin: 273 mg/dL (ref 212.0–360.0)

## 2018-09-07 LAB — SEDIMENTATION RATE: Sed Rate: 67 mm/hr — ABNORMAL HIGH (ref 0–30)

## 2018-09-07 LAB — FERRITIN: Ferritin: 87.1 ng/mL (ref 10.0–291.0)

## 2018-09-07 MED ORDER — KETOROLAC TROMETHAMINE 60 MG/2ML IM SOLN
60.0000 mg | Freq: Once | INTRAMUSCULAR | Status: AC
  Administered 2018-09-07: 60 mg via INTRAMUSCULAR

## 2018-09-07 MED ORDER — FUROSEMIDE 20 MG PO TABS: 20.0000 mg | ORAL_TABLET | Freq: Every day | ORAL | 0 refills | Status: DC

## 2018-09-07 MED ORDER — METHYLPREDNISOLONE ACETATE 80 MG/ML IJ SUSP
80.0000 mg | Freq: Once | INTRAMUSCULAR | Status: AC
  Administered 2018-09-07: 80 mg via INTRAMUSCULAR

## 2018-09-07 MED ORDER — GABAPENTIN 100 MG PO CAPS: 200.0000 mg | ORAL_CAPSULE | Freq: Every day | ORAL | 3 refills | Status: DC

## 2018-09-07 NOTE — Patient Instructions (Addendum)
Good to see you  Chest and back xray downstairs Labs downstairs Gabapentin 200 mg at night  Lasix for 5 days in a row. Any time swelling is worse take it for 2 days See me again in 3 weeks

## 2018-09-07 NOTE — Assessment & Plan Note (Signed)
Likely worsening at this time.  Gabapentin given.  Toradol and Depo-Medrol given.  X-rays ordered.  Will need to consider the possibility of advanced imaging.

## 2018-09-07 NOTE — Progress Notes (Signed)
[facility] [address], [place] [address] Phone: [phone] Subjective:   I Morgan Hurst am serving as a Education administrator for Dr. Hulan Saas.  I'  CC: Back pain and leg pain  JSE:[id number]   04/24/2018 Patient has been doing relatively well since then to the injection.  Discussed which activities of doing which wants to avoid.  Discussed icing regimen and home exercise.  Discussed which activities to potentially avoid.  We discussed differential includes lumbar radiculopathy and would maybe need to consider possible epidural if this does not make significant improvement.  Patient already was walking better noted as well.  Follow-up again 6 weeks  09/07/2018 Morgan Hurst is a 73 y.o. female coming in with complaint of bilateral leg weakness pain. Walking long distance she experiences pain. History of feet and leg swelling for the past 2-3 months.  Patient states that any type of walking greater than 300 feet has discomfort and pain going down the legs  Onset- a few months ago  Location -low back and then radiates to the legs Duration-  Character-dull aching sensation with worsening pain.  Patient feels like she continues to get weakness in the legs Aggravating factors- walking  Reliving factors-  Therapies tried- bucket of cold water, topical  Severity-now 8 out of 10 and worsening.  More concerned with the weakness     Past Medical History:  Diagnosis Date   Arthritis    lower back, right hip   Borderline diabetes    PCP STARTED PT ON METFORMIN IN 2016 DUE TO ELEVATED GLUCOSE    Complication of anesthesia    FIGHTING WHEN WAKING UP FROM ANESTHESIA   Diabetes mellitus without complication ([place])    Endometriosis    Heart murmur    Orthodontics    top front 3 teeth caps and bridge   Past Surgical History:  Procedure Laterality Date   ABDOMINAL HYSTERECTOMY  age 58   ANKLE SURGERY     CHOLECYSTECTOMY N/A 04/10/2015   Procedure:  LAPAROSCOPIC CHOLECYSTECTOMY WITH INTRAOPERATIVE CHOLANGIOGRAM;  Surgeon: Robert Bellow, MD;  Location: [facility];  Service: General;  Laterality: N/A;   COLONOSCOPY WITH PROPOFOL N/A 07/10/2017   Procedure: COLONOSCOPY WITH PROPOFOL;  Surgeon: Jonathon Bellows, MD;  Location: [facility];  Service: Gastroenterology;  Laterality: N/A;   ESOPHAGOGASTRODUODENOSCOPY N/A 05/12/2015   Procedure: ESOPHAGOGASTRODUODENOSCOPY (EGD);  Surgeon: Hulen Luster, MD;  Location: [facility];  Service: Gastroenterology;  Laterality: N/A;   VAGINAL DELIVERY  2   Social History   Socioeconomic History   Marital status: Widowed    Spouse name: Not on file   Number of children: Not on file   Years of education: Not on file   Highest education level: Not on file  Occupational History   Not on file  Social Needs   Financial resource strain: Not hard at all   Food insecurity    Worry: Never true    Inability: Never true   Transportation needs    Medical: No    Non-medical: No  Tobacco Use   Smoking status: Never Smoker   Smokeless tobacco: Never Used  Substance and Sexual Activity   Alcohol use: No   Drug use: No   Sexual activity: Never  Lifestyle   Physical activity    Days per week: 0 days    Minutes per session: 0 min   Stress: Not at all  Relationships   Social connections    Talks  on phone: Not on file    Gets together: Not on file    Attends religious service: Not on file    Active member of club or organization: Not on file    Attends meetings of clubs or organizations: Not on file    Relationship status: Not on file  Other Topics Concern   Not on file  Social History Narrative   Lives in [place].       Husband passed away 03-26-14 from PNA.      Work - retired from [employer] - regular; working on [employer]   Exercise - walks occasionally, limited by fatigue. [place] and takes care of her 31 year old grandchild.    No Known  Allergies Family History  Problem Relation Age of Onset   Diabetes Mother    Cancer Mother        colon and breast   Cancer Father        Leukemia   Cancer Brother        colon and lung   Cholelithiasis Daughter    Heart disease Paternal Grandfather    Cancer Other 29       colon    Current Outpatient Medications (Endocrine & Metabolic):    metFORMIN (GLUCOPHAGE) 500 MG tablet, Take 500 mg by mouth daily. Patient taking 2-3 times weekly.  Current Outpatient Medications (Cardiovascular):    colesevelam (WELCHOL) 625 MG tablet, Take 625 mg by mouth daily.   furosemide (LASIX) 20 MG tablet, Take 1 tablet (20 mg total) by mouth daily.   Current Outpatient Medications (Analgesics):    aspirin 81 MG tablet, Take 81 mg by mouth daily.   meloxicam (MOBIC) 7.5 MG tablet, Take 1 tablet (7.5 mg total) by mouth daily.   traMADol (ULTRAM) 50 MG tablet, TAKE 1 TABLET BY MOUTH DAILY AS NEEDED  Current Outpatient Medications (Hematological):    cyanocobalamin (,VITAMIN B-12,) 1000 MCG/ML injection, 1000 mcg injection once per month.  Current Outpatient Medications (Other):    CALCIUM PO, Take by mouth.   esomeprazole (NEXIUM) 40 MG capsule, Take by mouth.   mometasone (ELOCON) 0.1 % lotion, APPLY 4  DROPS  TO EACH EAR CANAL HS ONCE PER WEEK AND PRF ITCHING AND DRY SKIN   ondansetron (ZOFRAN) 4 MG tablet, Take 1 tablet (4 mg total) by mouth every 8 (eight) hours as needed for nausea or vomiting.   rOPINIRole (REQUIP) 0.25 MG tablet, TAKE 1 TABLET(0.25 MG) BY MOUTH AT BEDTIME AS NEEDED   traZODone (DESYREL) 50 MG tablet, Take 1 tablet (50 mg total) by mouth at bedtime. (Patient taking differently: Take 50 mg by mouth at bedtime. )   gabapentin (NEURONTIN) 100 MG capsule, Take 2 capsules (200 mg total) by mouth at bedtime.    Past medical history, social, surgical and family history all reviewed in electronic medical record.  No pertanent information unless stated  regarding to the chief complaint.   Review of Systems:  No headache, visual changes, nausea, vomiting, diarrhea, constipation, dizziness, abdominal pain, skin rash, fevers, chills, night sweats, weight loss, swollen lymph nodes,, chest pain, shortness of breath, mood changes.  Positive muscle aches and body aches  Objective  Blood pressure 140/80, pulse 79, height 5' 1"  (1.549 m), weight 231 lb (104.8 kg), SpO2 97 %.    General: No apparent distress alert and oriented x3 mood and affect normal, dressed appropriately.  Patient though does not appear that she is feeling comfortable.  HEENT: Pupils equal, extraocular movements intact  Respiratory: Patient's speak in full sentences and does not appear short of breath  Cardiovascular: No lower extremity edema, non tender, no erythema  Skin: Warm dry intact with no signs of infection or rash on extremities or on axial skeleton.  Abdomen: Soft nontender  Neuro: Cranial nerves II through XII are intact, neurovascularly intact in all extremities with 2+ DTRs and 2+ pulses.  Lymph: No lymphadenopathy of posterior or anterior cervical chain or axillae bilaterally.  Gait antalgic MSK: Tender to palpation of multiple joints.  Low back exam does have loss of lordosis.  Patient does have mild tightness of the hamstrings bilaterally even at 20 degrees of forward flexion.  Minimal radicular symptoms at this time.  Worsening pain in the back with extension.  No midline tenderness.  Limited range of motion in all planes of 5 to 10 degrees.  Deep tendon reflexes are intact    Impression and Recommendations:     This case required medical decision making of moderate complexity. The above documentation has been reviewed and is accurate and complete Lyndal Pulley, DO       Note: This dictation was prepared with Dragon dictation along with smaller phrase technology. Any transcriptional errors that result from this process are unintentional.

## 2018-09-07 NOTE — Assessment & Plan Note (Signed)
Patient is having back pain with radicular symptoms down the legs bilaterally.  Patient does so also has some shortness of breath at baseline and has has had chronic difficulty previously.  I do want patient to be checked with a BNP as well as a chest x-ray.  Then we will also do an x-ray of the lumbar spine to further evaluate.  Restart gabapentin, Toradol and Depo-Medrol given today, discussed icing regimen and home exercises, use over-the-counter medications that could be beneficial.  Follow-up again in 4 weeks.  Difficulty then will consider MRI.

## 2018-09-11 LAB — VITAMIN D 1,25 DIHYDROXY
Vitamin D 1, 25 (OH)2 Total: 35 pg/mL (ref 18–72)
Vitamin D2 1, 25 (OH)2: <8 pg/mL
Vitamin D3 1, 25 (OH)2: 35 pg/mL

## 2018-09-27 NOTE — Progress Notes (Signed)
Virtual Visit via Video Note  I connected with Morgan Hurst on 09/28/18 at 12:45 PM EDT by a video enabled telemedicine application and verified that I am speaking with the correct person using two identifiers.  Location: Patient: at home  Provider: at office    I discussed the limitations of evaluation and management by telemedicine and the availability of in person appointments. The patient expressed understanding and agreed to proceed.  History of Present Illness: 73 year old female who was having some mild shortness of breath Lower extremity swelling with a past medical history of congestive heart failure.  Chest x-ray was unremarkable and patient's laboratory work-up showed some sedimentation rate elevation but otherwise fairly unremarkable with a normal BNP.  Patient was started on a low dose of Lasix as well as gabapentin.  States that the back pain seems to be better and the swelling is significantly better but unfortunately continues to have some shortness of breath with activity, seems to get better with rest.  Patient states it is getting better and is now able to walk on her treadmill for 6 to 7 minutes before she could only do 30 seconds.  Still not where patient was previously.    Observations/Objective: Patient is able to speak in complete sentences, does not appear short of breath at all.  Alert and oriented x3   Assessment and Plan: Patient continues to have shortness of breath with increasing activity.  Patient has had a lung nodule previously and last CT of the chest without contrast was done in 2017.  We discussed repeating this but patient would like to hold and talk to her cardiologist.  She will discuss with them further about further work-up.  We discussed not changing any medications at this time because she is making improvement, worsening shortness of breath or chest pain to seek medical attention immediately.  Patient understands this.  Follow-up with me again  potentially virtually or on as a phone call in the next 4 weeks.   Follow Up Instructions: 4 weeks virtually    I discussed the assessment and treatment plan with the patient. The patient was provided an opportunity to ask questions and all were answered. The patient agreed with the plan and demonstrated an understanding of the instructions.   The patient was advised to call back or seek an in-person evaluation if the symptoms worsen or if the condition fails to improve as anticipated.  I provided 16 minutes of face-to-face time during this encounter.   Lyndal Pulley, DO

## 2018-09-28 ENCOUNTER — Encounter: Payer: Self-pay | Admitting: Family Medicine

## 2018-09-28 ENCOUNTER — Ambulatory Visit (INDEPENDENT_AMBULATORY_CARE_PROVIDER_SITE_OTHER): Payer: Medicare Other | Admitting: Family Medicine

## 2018-09-28 DIAGNOSIS — R29898 Other symptoms and signs involving the musculoskeletal system: Secondary | ICD-10-CM | POA: Diagnosis not present

## 2018-09-28 DIAGNOSIS — M48062 Spinal stenosis, lumbar region with neurogenic claudication: Secondary | ICD-10-CM

## 2018-09-28 MED ORDER — FUROSEMIDE 20 MG PO TABS: 20.0000 mg | ORAL_TABLET | Freq: Every day | ORAL | 3 refills | Status: DC

## 2018-11-28 ENCOUNTER — Other Ambulatory Visit: Payer: Self-pay | Admitting: Pulmonary Disease

## 2018-11-28 DIAGNOSIS — R0600 Dyspnea, unspecified: Secondary | ICD-10-CM

## 2018-12-05 ENCOUNTER — Other Ambulatory Visit: Payer: Self-pay | Admitting: Pulmonary Disease

## 2018-12-05 DIAGNOSIS — R0600 Dyspnea, unspecified: Secondary | ICD-10-CM

## 2018-12-05 DIAGNOSIS — R112 Nausea with vomiting, unspecified: Secondary | ICD-10-CM

## 2018-12-05 DIAGNOSIS — K7581 Nonalcoholic steatohepatitis (NASH): Secondary | ICD-10-CM

## 2018-12-06 ENCOUNTER — Ambulatory Visit
Admission: RE | Admit: 2018-12-06 | Discharge: 2018-12-06 | Disposition: A | Discharge status: Home / Self Care | Payer: Medicare Other | Source: Ambulatory Visit | Attending: Pulmonary Disease | Admitting: Pulmonary Disease

## 2018-12-06 ENCOUNTER — Other Ambulatory Visit: Payer: Self-pay

## 2018-12-06 DIAGNOSIS — R0600 Dyspnea, unspecified: Secondary | ICD-10-CM

## 2018-12-06 DIAGNOSIS — K7581 Nonalcoholic steatohepatitis (NASH): Secondary | ICD-10-CM | POA: Insufficient documentation

## 2018-12-06 DIAGNOSIS — R112 Nausea with vomiting, unspecified: Secondary | ICD-10-CM | POA: Diagnosis present

## 2019-01-21 ENCOUNTER — Telehealth: Payer: Self-pay | Admitting: Family

## 2019-01-21 NOTE — Telephone Encounter (Signed)
Pt would like a prescription for a pneumococcal/23 inj. Sent to her pharmacy; [facility] in [place].

## 2019-01-21 NOTE — Telephone Encounter (Signed)
Call patient She has satisfies the requirements for pneumonia vaccines.  She had the pneumococcal 23,  7 years ago and then she had Prevnar 27, 5 years ago  She doesn't need them again since she was over the age of 55 when she had them

## 2019-01-21 NOTE — Telephone Encounter (Signed)
I called and let patient know that she did not need pneumococcal/23. She did not realize that she has completed the series.

## 2019-01-21 NOTE — Telephone Encounter (Signed)
Are okay with me sending for patient? She last had pneumococcal/23 in 2013 & pneumococcal/13 in 2016.

## 2019-01-22 ENCOUNTER — Ambulatory Visit: Payer: Medicare Other

## 2019-02-12 ENCOUNTER — Ambulatory Visit
Admission: RE | Admit: 2019-02-12 | Discharge: 2019-02-12 | Disposition: A | Discharge status: Home / Self Care | Payer: Medicare Other | Source: Ambulatory Visit | Attending: Family | Admitting: Family

## 2019-02-12 DIAGNOSIS — Z1239 Encounter for other screening for malignant neoplasm of breast: Secondary | ICD-10-CM

## 2019-02-12 DIAGNOSIS — Z13820 Encounter for screening for osteoporosis: Secondary | ICD-10-CM | POA: Diagnosis present

## 2019-02-12 DIAGNOSIS — E559 Vitamin D deficiency, unspecified: Secondary | ICD-10-CM | POA: Insufficient documentation

## 2019-02-12 DIAGNOSIS — Z1231 Encounter for screening mammogram for malignant neoplasm of breast: Secondary | ICD-10-CM | POA: Diagnosis present

## 2019-02-12 DIAGNOSIS — Z780 Asymptomatic menopausal state: Secondary | ICD-10-CM | POA: Diagnosis not present

## 2019-02-13 ENCOUNTER — Other Ambulatory Visit: Payer: Self-pay | Admitting: Family

## 2019-02-13 DIAGNOSIS — R928 Other abnormal and inconclusive findings on diagnostic imaging of breast: Secondary | ICD-10-CM

## 2019-02-14 ENCOUNTER — Encounter: Payer: Self-pay | Admitting: Family

## 2019-02-14 DIAGNOSIS — M810 Age-related osteoporosis without current pathological fracture: Secondary | ICD-10-CM | POA: Insufficient documentation

## 2019-02-15 ENCOUNTER — Ambulatory Visit
Admission: RE | Admit: 2019-02-15 | Discharge: 2019-02-15 | Disposition: A | Discharge status: Home / Self Care | Payer: Medicare Other | Source: Ambulatory Visit | Attending: Family | Admitting: Family

## 2019-02-15 DIAGNOSIS — R928 Other abnormal and inconclusive findings on diagnostic imaging of breast: Secondary | ICD-10-CM | POA: Diagnosis present

## 2019-02-23 ENCOUNTER — Other Ambulatory Visit: Payer: Self-pay | Admitting: Family

## 2019-02-23 DIAGNOSIS — M48062 Spinal stenosis, lumbar region with neurogenic claudication: Secondary | ICD-10-CM

## 2019-02-25 NOTE — Telephone Encounter (Signed)
Call pt   I have refilled your tramadol  However I wanted to remind you that this is controlled substance.   In order for me to prescribe medication,  patients must be seen every 3 months.   Please make follow-up appointment this month for any further refills.    I looked up patient on [place] Controlled Substances Reporting System and saw no activity that raised concern of inappropriate use.

## 2019-02-28 NOTE — Telephone Encounter (Signed)
Patient has f/u appointment 2/24.

## 2019-03-06 DIAGNOSIS — Z01818 Encounter for other preprocedural examination: Secondary | ICD-10-CM | POA: Diagnosis not present

## 2019-03-08 DIAGNOSIS — R0602 Shortness of breath: Secondary | ICD-10-CM | POA: Diagnosis not present

## 2019-03-12 DIAGNOSIS — K7581 Nonalcoholic steatohepatitis (NASH): Secondary | ICD-10-CM | POA: Diagnosis not present

## 2019-03-12 DIAGNOSIS — R0602 Shortness of breath: Secondary | ICD-10-CM | POA: Diagnosis not present

## 2019-03-13 ENCOUNTER — Other Ambulatory Visit: Payer: Self-pay

## 2019-03-13 ENCOUNTER — Encounter: Payer: Self-pay | Admitting: Family

## 2019-03-13 ENCOUNTER — Other Ambulatory Visit: Payer: Self-pay | Admitting: Pulmonary Disease

## 2019-03-13 ENCOUNTER — Ambulatory Visit (INDEPENDENT_AMBULATORY_CARE_PROVIDER_SITE_OTHER): Payer: Medicare Other | Admitting: Family

## 2019-03-13 DIAGNOSIS — K7581 Nonalcoholic steatohepatitis (NASH): Secondary | ICD-10-CM

## 2019-03-13 DIAGNOSIS — R0600 Dyspnea, unspecified: Secondary | ICD-10-CM | POA: Diagnosis not present

## 2019-03-13 DIAGNOSIS — M810 Age-related osteoporosis without current pathological fracture: Secondary | ICD-10-CM | POA: Diagnosis not present

## 2019-03-13 NOTE — Patient Instructions (Addendum)
Dental clearance for fosamax. Please speak with your dentist about your taking prior to starting medication.   For post menopausal women, guidelines recommend a diet with 1200 mg of Calcium per day. If you are eating calcium rich foods, you do not need a calcium supplement. The body better absorbs the calcium that you eat over supplementation. If you do supplement, I recommend not supplementing the full 1200 mg/ day as this can lead to increased risk of cardiovascular disease. I recommend Calcium Citrate over the counter, and you may take a total of 600 to 800 mg per day in divided doses with meals for best absorption.   For bone health, you need adequate vitamin D, and I recommend you supplement as it is harder to do so with diet alone. I recommend cholecalciferol 800 units daily.  Also, please ensure you are following a diet high in calcium -- research shows better outcomes with dietary sources including kale, yogurt, broccolii, cheese, okra, almonds- to name a few.     Also remember that exercise is a great medicine for maintain and preserve bone health. Advise moderate exercise for 30 minutes , 3 times per week.    Alendronate tablets ( FOSAMAX) What is this medicine? ALENDRONATE (a LEN droe nate) slows calcium loss from bones. It helps to make normal healthy bone and to slow bone loss in people with Paget's disease and osteoporosis. It may be used in others at risk for bone loss. This medicine may be used for other purposes; ask your health care provider or pharmacist if you have questions. COMMON BRAND NAME(S): Fosamax What should I tell my health care provider before I take this medicine? They need to know if you have any of these conditions:  dental disease  esophagus, stomach, or intestine problems, like acid reflux or GERD  kidney disease  low blood calcium  low vitamin D  problems sitting or standing 30 minutes  trouble swallowing  an unusual or allergic reaction to  alendronate, other medicines, foods, dyes, or preservatives  pregnant or trying to get pregnant  breast-feeding How should I use this medicine? You must take this medicine exactly as directed or you will lower the amount of the medicine you absorb into your body or you may cause yourself harm. Take this medicine by mouth first thing in the morning, after you are up for the day. Do not eat or drink anything before you take your medicine. Swallow the tablet with a full glass (6 to 8 fluid ounces) of plain water. Do not take this medicine with any other drink. Do not chew or crush the tablet. After taking this medicine, do not eat breakfast, drink, or take any medicines or vitamins for at least 30 minutes. Sit or stand up for at least 30 minutes after you take this medicine; do not lie down. Do not take your medicine more often than directed. Talk to your pediatrician regarding the use of this medicine in children. Special care may be needed. Overdosage: If you think you have taken too much of this medicine contact a poison control center or emergency room at once. NOTE: This medicine is only for you. Do not share this medicine with others. What if I miss a dose? If you miss a dose, do not take it later in the day. Continue your normal schedule starting the next morning. Do not take double or extra doses. What may interact with this medicine?  aluminum hydroxide  antacids  aspirin  calcium supplements  drugs for inflammation like ibuprofen, naproxen, and others  iron supplements  magnesium supplements  vitamins with minerals This list may not describe all possible interactions. Give your health care provider a list of all the medicines, herbs, non-prescription drugs, or dietary supplements you use. Also tell them if you smoke, drink alcohol, or use illegal drugs. Some items may interact with your medicine. What should I watch for while using this medicine? Visit your doctor or health care  professional for regular checks ups. It may be some time before you see benefit from this medicine. Do not stop taking your medicine except on your doctor's advice. Your doctor or health care professional may order blood tests and other tests to see how you are doing. You should make sure you get enough calcium and vitamin D while you are taking this medicine, unless your doctor tells you not to. Discuss the foods you eat and the vitamins you take with your health care professional. Some people who take this medicine have severe bone, joint, and/or muscle pain. This medicine may also increase your risk for a broken thigh bone. Tell your doctor right away if you have pain in your upper leg or groin. Tell your doctor if you have any pain that does not go away or that gets worse. This medicine can make you more sensitive to the sun. If you get a rash while taking this medicine, sunlight may cause the rash to get worse. Keep out of the sun. If you cannot avoid being in the sun, wear protective clothing and use sunscreen. Do not use sun lamps or tanning beds/booths. What side effects may I notice from receiving this medicine? Side effects that you should report to your doctor or health care professional as soon as possible:  allergic reactions like skin rash, itching or hives, swelling of the face, lips, or tongue  black or tarry stools  bone, muscle or joint pain  changes in vision  chest pain  heartburn or stomach pain  jaw pain, especially after dental work  pain or trouble when swallowing  redness, blistering, peeling or loosening of the skin, including inside the mouth Side effects that usually do not require medical attention (report to your doctor or health care professional if they continue or are bothersome):  changes in taste  diarrhea or constipation  eye pain or itching  headache  nausea or vomiting  stomach gas or fullness This list may not describe all possible side  effects. Call your doctor for medical advice about side effects. You may report side effects to FDA at 1-800-FDA-1088. Where should I keep my medicine? Keep out of the reach of children. Store at room temperature of 15 and 30 degrees C (59 and 86 degrees F). Throw away any unused medicine after the expiration date. NOTE: This sheet is a summary. It may not cover all possible information. If you have questions about this medicine, talk to your doctor, pharmacist, or health care provider.  2020 Elsevier/Gold Standard (2010-07-02 08:56:09)

## 2019-03-13 NOTE — Assessment & Plan Note (Signed)
Declines fosamax at that time. Advised proper dose of vit D and calcium. She will have dental clearance prior to any initiation of medication due to dental bridge. She will follow up in 3 months.

## 2019-03-13 NOTE — Assessment & Plan Note (Signed)
Minimally improved.  Patient has been seen by pulmonology, cardiology for this.  Unfortunately unable to see chest x-ray from yesterday.  Patient is currently being diuresed and will call and let me know which medication Dr [name]  has put her on.  Advised patient at this time that I would await right upper quadrant ultrasound that he is ordered as well as diuresis, if no improvement, patient will let me know and we will regroup to establish next steps
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
## 2019-03-14 ENCOUNTER — Telehealth: Payer: Self-pay | Admitting: Family

## 2019-03-14 NOTE — Telephone Encounter (Signed)
Called pt to schedule 3 month follow up. Pt did not have voicemail on home phone number and on the cell phone number it was not set up. Sent myChart message.

## 2019-03-22 ENCOUNTER — Other Ambulatory Visit: Payer: Self-pay

## 2019-03-22 ENCOUNTER — Ambulatory Visit
Admission: RE | Admit: 2019-03-22 | Discharge: 2019-03-22 | Disposition: A | Discharge status: Home / Self Care | Payer: Medicare Other | Source: Ambulatory Visit | Attending: Pulmonary Disease | Admitting: Pulmonary Disease

## 2019-03-22 DIAGNOSIS — K7581 Nonalcoholic steatohepatitis (NASH): Secondary | ICD-10-CM | POA: Insufficient documentation

## 2019-03-22 DIAGNOSIS — K760 Fatty (change of) liver, not elsewhere classified: Secondary | ICD-10-CM | POA: Diagnosis not present

## 2019-04-10 DIAGNOSIS — K7581 Nonalcoholic steatohepatitis (NASH): Secondary | ICD-10-CM | POA: Diagnosis not present

## 2019-05-02 ENCOUNTER — Other Ambulatory Visit: Payer: Self-pay | Admitting: Family

## 2019-05-02 DIAGNOSIS — M48062 Spinal stenosis, lumbar region with neurogenic claudication: Secondary | ICD-10-CM

## 2019-05-02 DIAGNOSIS — K760 Fatty (change of) liver, not elsewhere classified: Secondary | ICD-10-CM

## 2019-05-02 DIAGNOSIS — E538 Deficiency of other specified B group vitamins: Secondary | ICD-10-CM

## 2019-05-03 NOTE — Telephone Encounter (Signed)
Call pt Refilled b12, tramadol She is due for fasting labs, including b12

## 2019-05-10 NOTE — Telephone Encounter (Signed)
Patient has appointment 4/28 to see Joycelyn Schmid.

## 2019-05-15 ENCOUNTER — Encounter: Payer: Self-pay | Admitting: Family

## 2019-05-15 ENCOUNTER — Ambulatory Visit (INDEPENDENT_AMBULATORY_CARE_PROVIDER_SITE_OTHER): Payer: Medicare Other | Admitting: Family

## 2019-05-15 ENCOUNTER — Other Ambulatory Visit: Payer: Self-pay

## 2019-05-15 VITALS — BP 110/72 | HR 78 | Temp 97.3°F | Ht 61.5 in | Wt 220.2 lb

## 2019-05-15 DIAGNOSIS — R0600 Dyspnea, unspecified: Secondary | ICD-10-CM | POA: Diagnosis not present

## 2019-05-15 DIAGNOSIS — E538 Deficiency of other specified B group vitamins: Secondary | ICD-10-CM | POA: Diagnosis not present

## 2019-05-15 DIAGNOSIS — K760 Fatty (change of) liver, not elsewhere classified: Secondary | ICD-10-CM | POA: Diagnosis not present

## 2019-05-15 LAB — COMPREHENSIVE METABOLIC PANEL
ALT: 28 U/L (ref 0–35)
AST: 31 U/L (ref 0–37)
Albumin: 4.1 g/dL (ref 3.5–5.2)
Alkaline Phosphatase: 72 U/L (ref 39–117)
BUN: 13 mg/dL (ref 6–23)
CO2: 25 mEq/L (ref 19–32)
Calcium: 9.1 mg/dL (ref 8.4–10.5)
Chloride: 105 mEq/L (ref 96–112)
Creatinine, Ser: 0.6 mg/dL (ref 0.40–1.20)
GFR: 98.01 mL/min (ref >60.00)
Glucose, Bld: 95 mg/dL (ref 70–99)
Potassium: 3.6 mEq/L (ref 3.5–5.1)
Sodium: 139 mEq/L (ref 135–145)
Total Bilirubin: 0.5 mg/dL (ref 0.2–1.2)
Total Protein: 7.5 g/dL (ref 6.0–8.3)

## 2019-05-15 LAB — LIPID PANEL
Cholesterol: 191 mg/dL (ref 0–200)
HDL: 45.6 mg/dL (ref >39.00)
LDL Cholesterol: 118 mg/dL — ABNORMAL HIGH (ref 0–99)
NonHDL: 145.16
Total CHOL/HDL Ratio: 4
Triglycerides: 137 mg/dL (ref 0.0–149.0)
VLDL: 27.4 mg/dL (ref 0.0–40.0)

## 2019-05-15 LAB — B12 AND FOLATE PANEL
Folate: 10.7 ng/mL (ref >5.9)
Vitamin B-12: 555 pg/mL (ref 211–911)

## 2019-05-15 LAB — HEMOGLOBIN A1C: Hgb A1c MFr Bld: 5.9 % (ref 4.6–6.5)

## 2019-05-15 NOTE — Assessment & Plan Note (Signed)
Some improvement.  No acute respiratory distress.  Patient not labored her speech.  Her weight is down.  Suspect obesity, deconditioning contributory.  Pending referral to [facility] and wellness

## 2019-05-15 NOTE — Progress Notes (Signed)
Subjective:    Patient ID: Morgan Hurst, female    DOB: 1945/07/17, 74 y.o.   MRN: [id number]  CC: Morgan Hurst is a 74 y.o. female who presents today for follow up.   HPI: Feels better today.  No new complaints.   NASH- Has been working on weight loss, 11lbs.  SOB- continues to occur with activity. Some improvement. No CP, dizziness  No leg swelling. Remains on metolazone.   Continues to feel she is weak in her lower extremities. No numbness or trouble balance. No falls. Declines PT.   [facility] Dr [name] for NASH- note isnt closed yet.  Per patient told concerns for lung disease. Has had PFTs and per patient normal. Liver enzymes normal 04/10/19    HISTORY:  Past Medical History:  Diagnosis Date  . Arthritis    lower back, right hip  . Borderline diabetes    PCP STARTED PT ON METFORMIN IN 2016 DUE TO ELEVATED GLUCOSE   . Complication of anesthesia    FIGHTING WHEN WAKING UP FROM ANESTHESIA  . Diabetes mellitus without complication ([place])   . Endometriosis   . Heart murmur   . Orthodontics    top front 3 teeth caps and bridge   Past Surgical History:  Procedure Laterality Date  . ABDOMINAL HYSTERECTOMY  age 74  . ANKLE SURGERY    . CHOLECYSTECTOMY N/A 04/10/2015   Procedure: LAPAROSCOPIC CHOLECYSTECTOMY WITH INTRAOPERATIVE CHOLANGIOGRAM;  Surgeon: Robert Bellow, MD;  Location: [facility] ORS;  Service: General;  Laterality: N/A;  . COLONOSCOPY WITH PROPOFOL N/A 07/10/2017   Procedure: COLONOSCOPY WITH PROPOFOL;  Surgeon: Jonathon Bellows, MD;  Location: [facility];  Service: Gastroenterology;  Laterality: N/A;  . ESOPHAGOGASTRODUODENOSCOPY N/A 05/12/2015   Procedure: ESOPHAGOGASTRODUODENOSCOPY (EGD);  Surgeon: Hulen Luster, MD;  Location: [place];  Service: Gastroenterology;  Laterality: N/A;  . VAGINAL DELIVERY  2   Family History  Problem Relation Age of Onset  . Diabetes Mother   . Cancer Mother        colon and breast  . Breast cancer Mother   .  Cancer Father        Leukemia  . Cancer Brother        colon and lung  . Cholelithiasis Daughter   . Heart disease Paternal Grandfather   . Cancer Other 29       colon    Allergies: Patient has no known allergies. Current Outpatient Medications on File Prior to Visit  Medication Sig Dispense Refill  . aspirin 81 MG tablet Take 81 mg by mouth daily.    . calcium carbonate (OSCAL) 1500 (600 Ca) MG TABS tablet Take 600 mg by mouth 2 (two) times daily with a meal.    . CALCIUM PO Take by mouth.    . cholecalciferol (VITAMIN D3) 25 MCG (1000 UNIT) tablet Take 2,000 Units by mouth daily.    . colesevelam (WELCHOL) 625 MG tablet Take 625 mg by mouth daily.    . cyanocobalamin (,VITAMIN B-12,) 1000 MCG/ML injection INJECT 1 ML ONCE PER MONTH 1 mL 15  . esomeprazole (NEXIUM) 40 MG capsule Take by mouth.    . meloxicam (MOBIC) 7.5 MG tablet Take 1 tablet (7.5 mg total) by mouth daily. 30 tablet 0  . metFORMIN (GLUCOPHAGE) 500 MG tablet Take 500 mg by mouth daily. Patient taking 2-3 times weekly.    . ondansetron (ZOFRAN) 4 MG tablet Take 1 tablet (4 mg total) by mouth every 8 (eight) hours  as needed for nausea or vomiting. 20 tablet 0  . rOPINIRole (REQUIP) 0.25 MG tablet TAKE 1 TABLET(0.25 MG) BY MOUTH AT BEDTIME AS NEEDED 90 tablet 0  . traMADol (ULTRAM) 50 MG tablet TAKE 1 TABLET BY MOUTH DAILY AS NEEDED 30 tablet 1  . traZODone (DESYREL) 50 MG tablet Take 1 tablet (50 mg total) by mouth at bedtime. (Patient taking differently: Take 50 mg by mouth at bedtime. ) 90 tablet 1  . metolazone (ZAROXOLYN) 5 MG tablet Take 5 mg by mouth daily.     No current facility-administered medications on file prior to visit.    Social History   Tobacco Use  . Smoking status: Never Smoker  . Smokeless tobacco: Never Used  Substance Use Topics  . Alcohol use: No  . Drug use: No    Review of Systems  Constitutional: Negative for chills and fever.  Respiratory: Positive for shortness of breath.  Negative for cough and wheezing.   Cardiovascular: Positive for leg swelling. Negative for chest pain and palpitations.  Gastrointestinal: Negative for nausea and vomiting.  Neurological: Negative for dizziness and headaches.      Objective:    BP 110/72 (BP Location: Left Arm, Patient Position: Sitting, Cuff Size: Large)   Pulse 78   Temp (!) 97.3 F (36.3 C) (Temporal)   Ht 5' 1.5" (1.562 m)   Wt 220 lb 3.2 oz (99.9 kg)   SpO2 97%   BMI 40.93 kg/m  BP Readings from Last 3 Encounters:  05/15/19 110/72  03/13/19 140/80  09/07/18 140/80   Wt Readings from Last 3 Encounters:  05/15/19 220 lb 3.2 oz (99.9 kg)  03/13/19 227 lb (103 kg)  09/07/18 231 lb (104.8 kg)    Physical Exam Vitals reviewed.  Constitutional:      Appearance: She is well-developed.  Eyes:     Conjunctiva/sclera: Conjunctivae normal.  Cardiovascular:     Rate and Rhythm: Normal rate and regular rhythm.     Pulses: Normal pulses.     Heart sounds: Normal heart sounds.  Pulmonary:     Effort: Pulmonary effort is normal.     Breath sounds: Normal breath sounds. No wheezing, rhonchi or rales.  Musculoskeletal:     Right lower leg: No edema.     Left lower leg: No edema.  Skin:    General: Skin is warm and dry.  Neurological:     Mental Status: She is alert.  Psychiatric:        Speech: Speech normal.        Behavior: Behavior normal.        Thought Content: Thought content normal.        Assessment & Plan:   Problem List Items Addressed This Visit      Digestive   Fatty liver disease, nonalcoholic - Primary    Discussed recent ultrasound which is on the pulmonology which shows fatty liver disease.  Patient is already well on her way with weight loss .Marland Kitchen  Discussed NASH and potential ofr progression  to cirrhosis, liver cancer .  She understands important weight loss.  Referral GI for further discussion and to Dr Leafy Ro. Information on NASH given on AVS.      Relevant Orders   Ambulatory  referral to Gastroenterology   Amb Ref to Medical Weight Management     Other   B12 deficiency   Dyspnea    Some improvement.  No acute respiratory distress.  Patient not labored her speech.  Her  weight is down.  Suspect obesity, deconditioning contributory.  Pending referral to [facility] health and wellness          I have discontinued Marcelia Petersen. [place]'s mometasone. I am also having her maintain her aspirin, CALCIUM PO, metFORMIN, esomeprazole, traZODone, colesevelam, ondansetron, rOPINIRole, meloxicam, cholecalciferol, calcium carbonate, cyanocobalamin, traMADol, and metolazone.   No orders of the defined types were placed in this encounter.   Return precautions given.   Risks, benefits, and alternatives of the medications and treatment plan prescribed today were discussed, and patient expressed understanding.   Education regarding symptom management and diagnosis given to patient on AVS.  Continue to follow with Burnard Hawthorne, FNP for routine health maintenance.   [name] and I agreed with plan.   Mable Paris, FNP

## 2019-05-15 NOTE — Assessment & Plan Note (Signed)
Discussed recent ultrasound which is on the pulmonology which shows fatty liver disease.  Patient is already well on her way with weight loss .Marland Kitchen  Discussed NASH and potential ofr progression  to cirrhosis, liver cancer .  She understands important weight loss.  Referral GI for further discussion and to Dr Leafy Ro. Information on NASH given on AVS.

## 2019-05-15 NOTE — Patient Instructions (Addendum)
Future labs as ordered  Nice to see you!  Nonalcoholic Fatty Liver Disease Diet Introduction Nonalcoholic fatty liver disease is a condition that causes fat to accumulate in and around the liver. The disease makes it harder for the liver to work the way that it should. Following a healthy diet can help to keep nonalcoholic fatty liver disease under control. It can also help to prevent or improve conditions that are associated with the disease, such as heart disease, diabetes, high blood pressure, and abnormal cholesterol levels. Along with regular exercise, this diet:  Promotes weight loss.  Helps to control blood sugar levels.  Helps to improve the way that the body uses insulin. What do I need to know about this diet?  Use the glycemic index (GI) to plan your meals. The index tells you how quickly a food will raise your blood sugar. Choose low-GI foods. These foods take a longer time to raise blood sugar.  Keep track of how many calories you take in. Eating the right amount of calories will help you to achieve a healthy weight.  You may want to follow a Mediterranean diet. This diet includes a lot of vegetables, lean meats or fish, whole grains, fruits, and healthy oils and fats. What foods can I eat? Grains  Whole grains, such as whole-wheat or whole-grain breads, crackers, tortillas, cereals, and pasta. Stone-ground whole wheat. Pumpernickel bread. Unsweetened oatmeal. Bulgur. Barley. Quinoa. Brown or wild rice. Corn or whole-wheat flour tortillas. Vegetables  Lettuce. Spinach. Peas. Beets. Cauliflower. Cabbage. Broccoli. Carrots. Tomatoes. Squash. Eggplant. Herbs. Peppers. Onions. Cucumbers. Brussels sprouts. Yams and sweet potatoes. Beans. Lentils. Fruits  Bananas. Apples. Oranges. Grapes. Papaya. Mango. Pomegranate. Kiwi. Grapefruit. Cherries. Meats and Other Protein Sources  Seafood and shellfish. Lean meats. Poultry. Tofu. Dairy  Low-fat or fat-free dairy products, such as  yogurt, cottage cheese, and cheese. Beverages  Water. Sugar-free drinks. Tea. Coffee. Low-fat or skim milk. Milk alternatives, such as soy or almond milk. Real fruit juice. Condiments  Mustard. Relish. Low-fat, low-sugar ketchup and barbecue sauce. Low-fat or fat-free mayonnaise. Sweets and Desserts  Sugar-free sweets. Fats and Oils  Avocado. Canola or olive oil. Nuts and nut butters. Seeds. The items listed above may not be a complete list of recommended foods or beverages. Contact your dietitian for more options.  What foods are not recommended? Palm oil and coconut oil. Processed foods. Fried foods. Sweetened drinks, such as sweet tea, milkshakes, snow cones, iced sweet drinks, and sodas. Alcohol. Sweets. Foods that contain a lot of salt or sodium. The items listed above may not be a complete list of foods and beverages to avoid. Contact your dietitian for more information.  This information is not intended to replace advice given to you by your health care provider. Make sure you discuss any questions you have with your health care provider. Document Released: 05/20/2014 Document Revised: 06/11/2015 Document Reviewed: 01/28/2014  2017 Elsevier  Fatty Liver Introduction  Fatty liver, also called hepatic steatosis or steatohepatitis, is a condition in which too much fat has built up in your liver cells. The liver removes harmful substances from your bloodstream. It produces fluids your body needs. It also helps your body use and store energy from the food you eat. In many cases, fatty liver does not cause symptoms or problems. It is often diagnosed when tests are being done for other reasons. However, over time, fatty liver can cause inflammation that may lead to more serious liver problems, such as scarring of the liver (  cirrhosis). What are the causes? Causes of fatty liver may include:  Drinking too much alcohol.  Poor nutrition.  Obesity.  Cushing  syndrome.  Diabetes.  Hyperlipidemia.  Pregnancy.  Certain drugs.  Poisons.  Some viral infections. What increases the risk? You may be more likely to develop fatty liver if you:  Abuse alcohol.  Are pregnant.  Are overweight.  Have diabetes.  Have hepatitis.  Have a high triglyceride level. What are the signs or symptoms? Fatty liver often does not cause any symptoms. In cases where symptoms develop, they can include:  Fatigue.  Weakness.  Weight loss.  Confusion.  Abdominal pain.  Yellowing of your skin and the white parts of your eyes (jaundice).  Nausea and vomiting. How is this diagnosed? Fatty liver may be diagnosed by:  Physical exam and medical history.  Blood tests.  Imaging tests, such as an ultrasound, CT scan, or MRI.  Liver biopsy. A small sample of liver tissue is removed using a needle. The sample is then looked at under a microscope. How is this treated? Fatty liver is often caused by other health conditions. Treatment for fatty liver may involve medicines and lifestyle changes to manage conditions such as:  Alcoholism.  High cholesterol.  Diabetes.  Being overweight or obese. Follow these instructions at home:  Eat a healthy diet as directed by your health care provider.  Exercise regularly. This can help you lose weight and control your cholesterol and diabetes. Talk to your health care provider about an exercise plan and which activities are best for you.  Do not drink alcohol.  Take medicines only as directed by your health care provider. Contact a health care provider if: You have difficulty controlling your:  Blood sugar.  Cholesterol.  Alcohol consumption. Get help right away if:  You have abdominal pain.  You have jaundice.  You have nausea and vomiting. This information is not intended to replace advice given to you by your health care provider. Make sure you discuss any questions you have with your health  care provider.

## 2019-05-17 LAB — INTRINSIC FACTOR ANTIBODIES: Intrinsic Factor: NEGATIVE

## 2019-06-12 ENCOUNTER — Ambulatory Visit: Payer: Medicare Other | Admitting: Family

## 2019-06-20 ENCOUNTER — Ambulatory Visit (INDEPENDENT_AMBULATORY_CARE_PROVIDER_SITE_OTHER): Payer: Medicare Other | Admitting: Family Medicine

## 2019-06-20 ENCOUNTER — Encounter (INDEPENDENT_AMBULATORY_CARE_PROVIDER_SITE_OTHER): Payer: Self-pay | Admitting: Family Medicine

## 2019-06-20 ENCOUNTER — Other Ambulatory Visit: Payer: Self-pay

## 2019-06-20 VITALS — BP 136/81 | HR 74 | Temp 97.8°F | Ht 61.0 in | Wt 213.0 lb

## 2019-06-20 DIAGNOSIS — Z1331 Encounter for screening for depression: Secondary | ICD-10-CM | POA: Diagnosis not present

## 2019-06-20 DIAGNOSIS — R7303 Prediabetes: Secondary | ICD-10-CM

## 2019-06-20 DIAGNOSIS — M818 Other osteoporosis without current pathological fracture: Secondary | ICD-10-CM

## 2019-06-20 DIAGNOSIS — E538 Deficiency of other specified B group vitamins: Secondary | ICD-10-CM

## 2019-06-20 DIAGNOSIS — R5383 Other fatigue: Secondary | ICD-10-CM | POA: Diagnosis not present

## 2019-06-20 DIAGNOSIS — E7849 Other hyperlipidemia: Secondary | ICD-10-CM

## 2019-06-20 DIAGNOSIS — R1013 Epigastric pain: Secondary | ICD-10-CM

## 2019-06-20 DIAGNOSIS — K760 Fatty (change of) liver, not elsewhere classified: Secondary | ICD-10-CM

## 2019-06-20 DIAGNOSIS — G4730 Sleep apnea, unspecified: Secondary | ICD-10-CM

## 2019-06-20 DIAGNOSIS — Z6841 Body Mass Index (BMI) 40.0 and over, adult: Secondary | ICD-10-CM

## 2019-06-20 DIAGNOSIS — R0602 Shortness of breath: Secondary | ICD-10-CM | POA: Diagnosis not present

## 2019-06-20 DIAGNOSIS — G8929 Other chronic pain: Secondary | ICD-10-CM

## 2019-06-20 DIAGNOSIS — E559 Vitamin D deficiency, unspecified: Secondary | ICD-10-CM

## 2019-06-20 DIAGNOSIS — E66813 Obesity, class 3: Secondary | ICD-10-CM

## 2019-06-20 DIAGNOSIS — Z0289 Encounter for other administrative examinations: Secondary | ICD-10-CM

## 2019-06-20 NOTE — Progress Notes (Signed)
Dear Dr. Vidal Schwalbe,   Thank you for referring Morgan Hurst to our clinic. The following note includes my evaluation and treatment recommendations.  Chief Complaint:   Morgan Hurst (MR# [id number]) is a 74 y.o. female who presents for evaluation and treatment of obesity and related comorbidities. Current BMI is Body mass index is 40.25 kg/m. Morgan Hurst has been struggling with her weight for many years and has been unsuccessful in either losing weight, maintaining weight loss, or reaching her healthy weight goal.  Phillipa is currently in the action stage of change and ready to dedicate time achieving and maintaining a healthier weight. Morgan Hurst is interested in becoming our patient and working on intensive lifestyle modifications including (but not limited to) diet and exercise for weight loss.  [name] is retired.  Widowed.    [name] provided the following food recall today:  Breakfast:  Cottage cheese, peaches, diet soda. Lunch:  [employer] last week and salmon salad with sugar pickles with splenda or sandwich rollup with pretzel crackers with peanut butter. Dinner:  Hotdogs, hamburgers, mashed potatoes. Snack:  Fruit. Drinks:  She drinks 4 bottles of water per day.  Shakes:  Nectar protein powder or Boost, 1 every other day.  [name]'s habits were reviewed today and are as follows: Her family eats meals together, her desired weight goal is to be under 200 pounds, she started gaining weight after childbirth, her heaviest weight ever was 231 pounds, she craves sweets and bread, she is frequently drinking liquids with calories, she frequently makes poor food choices and she struggles with emotional eating.  Depression Screen [name]'s Food and Mood (modified PHQ-9) score was 3.  Depression screen PHQ 2/9 06/20/2019  Decreased Interest 1  Down, Depressed, Hopeless 1  PHQ - 2 Score 2  Altered sleeping 0  Tired, decreased energy 0  Change in appetite 0  Feeling bad or failure about  yourself  0  Trouble concentrating 1  Moving slowly or fidgety/restless 0  Suicidal thoughts 0  PHQ-9 Score 3  Difficult Hurst work/chores Not difficult at all   Subjective:   1. Other fatigue [name] admits to daytime somnolence and reports waking up still tired. Patent has a history of symptoms of daytime fatigue, morning fatigue and snoring. [name] generally gets 5 or 6 hours of sleep per night, and states that she has generally restful sleep. Snoring is present. Apneic episodes are present. Epworth Sleepiness Score is 13.  2. SOB (shortness of breath) on exertion [name] notes increasing shortness of breath with exercising and seems to be worsening over time with weight gain. She notes getting out of breath sooner with activity than she used to. This has gotten worse recently. [name] denies shortness of breath at rest or orthopnea.  Chest xray and PFTs showed increased fluid due to liver issues.  Dr. [name] said this is not a cardiac problem.  3. Prediabetes [name] has a diagnosis of prediabetes based on her elevated HgA1c and was informed this puts her at greater risk of developing diabetes. She continues to work on diet and exercise to decrease her risk of diabetes. She denies nausea or hypoglycemia.  She is taking metformin 500 mg every other day.  Lab Results  Component Value Date   HGBA1C 5.9 05/15/2019   4. Dyspepsia [name] has been experiencing some dyspepsia.  She takes Nexium 40 mg daily.  5. NAFLD (nonalcoholic fatty liver disease) Cache has the diagnosis of NAFLD.  Her BMI is over 40. She denies  abdominal pain or jaundice and has never been told of any liver problems in the past. She denies excessive alcohol intake.  She will be seeing GI next week.  Lab Results  Component Value Date   ALT 28 05/15/2019   AST 31 05/15/2019   ALKPHOS 72 05/15/2019   BILITOT 0.5 05/15/2019   6. Other chronic pain Morgan Hurst has osteoarthritis of the hip and back.    7. Other  hyperlipidemia Morgan Hurst has hyperlipidemia and has been trying to improve her cholesterol levels with intensive lifestyle modification including a low saturated fat diet, exercise and weight loss. She denies any chest pain, claudication or myalgias.  Lab Results  Component Value Date   ALT 28 05/15/2019   AST 31 05/15/2019   ALKPHOS 72 05/15/2019   BILITOT 0.5 05/15/2019   Lab Results  Component Value Date   CHOL 191 05/15/2019   HDL 45.60 05/15/2019   LDLCALC 118 (H) 05/15/2019   LDLDIRECT 177.7 12/25/2012   TRIG 137.0 05/15/2019   CHOLHDL 4 05/15/2019   8. Vitamin D deficiency Morgan Hurst's Vitamin D level was 35 on 09/07/2018. She is currently taking OTC vitamin D 2000 IU each day. She denies nausea, vomiting or muscle weakness.  9. Other osteoporosis Morgan Hurst has been diagnosed with osteoporosis.  She is taking a calcium supplement.  10. Vitamin B12 deficiency She notes fatigue. She is not a vegetarian.  She does not have a previous diagnosis of pernicious anemia.  She does not have a history of weight loss surgery.   Lab Results  Component Value Date   [id number] 601 05/15/2019   11. Sleep-disordered breathing Morgan Hurst has a history of 2 negative sleep studies.  Epworth sleepiness score is 13.  Situation Chance of Dozing or Sleeping  Sitting and reading 3 = high chance of dozing or sleeping  Watching television 2 = moderate chance of dozing or sleeping  Sitting as a passenger in a car for an hour 2 = moderate chance of dozing or sleeping  Sitting inactive in a public place (theater or meeting) 0 = would never doze or sleep  Lying down in the afternoon when circumstances permit 3 = high chance of dozing or sleeping  Sitting and talking to someone 0 = would never doze or sleep  Sitting quietly after lunch without alcohol 3 = high chance of dozing or sleeping  In a car, while stopped for a few minutes in traffic 0 = would never doze or sleep  TOTAL 13   12. Depression screening Morgan Hurst  was screened for depression as part of her new patient workup.  Assessment/Plan:   1. Other fatigue [name] does feel that her weight is causing her energy to be lower than it should be. Fatigue may be related to obesity, depression or many other causes. Labs will be ordered, and in the meanwhile, Timisha will focus on self care including making healthy food choices, increasing physical activity and focusing on stress reduction.  Orders - EKG 12-Lead - Comprehensive metabolic panel - CBC with Differential/Platelet - T3 - T4, free - TSH  2. SOB (shortness of breath) on exertion Jnaya does feel that she gets out of breath more easily that she used to when she exercises. Carys's shortness of breath appears to be obesity related and exercise induced. She has agreed to work on weight loss and gradually increase exercise to treat her exercise induced shortness of breath. Will continue to monitor closely.  Orders - Comprehensive metabolic panel - CBC with Differential/Platelet -  T3 - T4, free - TSH  3. Prediabetes Tyianna will continue to work on weight loss, exercise, and decreasing simple carbohydrates to help decrease the risk of diabetes.   4. Dyspepsia Current treatment plan is effective, no change in therapy.  5. NAFLD (nonalcoholic fatty liver disease) Margeart was educated the importance of weight loss. Marleta agreed to continue with her weight loss efforts with healthier diet and exercise as an essential part of her treatment plan.  6. Other chronic pain Will follow because mobility and pain control are important for weight management.  7. Other hyperlipidemia Cardiovascular risk and specific lipid/LDL goals reviewed.  We discussed several lifestyle modifications today and Adaliz will continue to work on diet, exercise and weight loss efforts. Orders and follow up as documented in patient record.   Counseling Intensive lifestyle modifications are the first line treatment for this  issue. . Dietary changes: Increase soluble fiber. Decrease simple carbohydrates. . Exercise changes: Moderate to vigorous-intensity aerobic activity 150 minutes per week if tolerated. . Lipid-lowering medications: see documented in medical record.  8. Vitamin D deficiency Low Vitamin D level contributes to fatigue and are associated with obesity, breast, and colon cancer. She agrees to continue to take OTC Vitamin D @2 ,000 IU daily and will follow-up for routine testing of Vitamin D, at least 2-3 times per year to avoid over-replacement.  9. Other osteoporosis, unspecified pathological fracture presence We will continue to monitor.  10. Vitamin B12 deficiency The diagnosis was reviewed with the patient. Counseling provided today, see below. We will continue to monitor. Orders and follow up as documented in patient record.  Counseling . The body needs vitamin B12: to make red blood cells; to make DNA; and to help the nerves work properly so they can carry messages from the brain to the body.  . The main causes of vitamin B12 deficiency include dietary deficiency, digestive diseases, pernicious anemia, and having a surgery in which part of the stomach or small intestine is removed.  . Certain medicines can make it harder for the body to absorb vitamin B12. These medicines include: heartburn medications; some antibiotics; some medications used to treat diabetes, gout, and high cholesterol.  . In some cases, there are no symptoms of this condition. If the condition leads to anemia or nerve damage, various symptoms can occur, such as weakness or fatigue, shortness of breath, and numbness or tingling in your hands and feet.   . Treatment:  o May include taking vitamin B12 supplements.  o Avoid alcohol.  o Eat lots of healthy foods that contain vitamin B12: - Beef, pork, chicken, [place], and organ meats, such as liver.  - Seafood: This includes clams, rainbow trout, salmon, tuna, and haddock. Eggs.   - Cereal and dairy products that are fortified: This means that vitamin B12 has been added to the food.   11. Sleep-disordered breathing We will continue to monitor.  12. Depression screening Depression screen was negative.  13. Class 3 severe obesity with serious comorbidity and body mass index (BMI) of 40.0 to 44.9 in adult, unspecified obesity type (HCC) Aanyah is currently in the action stage of change and her goal is to continue with weight loss efforts. I recommend Patsy begin the structured treatment plan as follows:  She has agreed to the Category 3 Plan.  Exercise goals: No exercise has been prescribed at this time.   Behavioral modification strategies: increasing lean protein intake, decreasing simple carbohydrates, increasing vegetables, increasing water intake, decreasing liquid calories and  decreasing alcohol intake.  She was informed of the importance of frequent follow-up visits to maximize her success with intensive lifestyle modifications for her multiple health conditions. She was informed we would discuss her lab results at her next visit unless there is a critical issue that needs to be addressed sooner. Janaisha agreed to keep her next visit at the agreed upon time to discuss these results.  Objective:   Blood pressure 136/81, pulse 74, temperature 97.8 F (36.6 C), temperature source Oral, height 5' 1"  (1.549 m), weight 213 lb (96.6 kg), SpO2 96 %. Body mass index is 40.25 kg/m.  EKG: Normal sinus rhythm, rate 74 bpm.  Indirect Calorimeter completed today shows a VO2 of 309 and a REE of 2152.  Her calculated basal metabolic rate is 4580 thus her basal metabolic rate is better than expected.  General: Cooperative, alert, well developed, in no acute distress. HEENT: Conjunctivae and lids unremarkable. Cardiovascular: Regular rhythm.  Lungs: Normal work of breathing. Neurologic: No focal deficits.   Lab Results  Component Value Date   CREATININE 0.60 05/15/2019    BUN 13 05/15/2019   NA 139 05/15/2019   K 3.6 05/15/2019   CL 105 05/15/2019   CO2 25 05/15/2019   Lab Results  Component Value Date   ALT 28 05/15/2019   AST 31 05/15/2019   ALKPHOS 72 05/15/2019   BILITOT 0.5 05/15/2019   Lab Results  Component Value Date   HGBA1C 5.9 05/15/2019   HGBA1C 6.0 09/04/2018   HGBA1C 5.9 12/25/2017   HGBA1C 6.0 04/11/2017   HGBA1C 5.7 02/22/2016   Lab Results  Component Value Date   TSH 3.26 12/25/2017   Lab Results  Component Value Date   CHOL 191 05/15/2019   HDL 45.60 05/15/2019   LDLCALC 118 (H) 05/15/2019   LDLDIRECT 177.7 12/25/2012   TRIG 137.0 05/15/2019   CHOLHDL 4 05/15/2019   Lab Results  Component Value Date   WBC 8.4 09/04/2018   HGB 14.3 09/04/2018   HCT 43.6 09/04/2018   MCV 88.8 09/04/2018   PLT 188.0 09/04/2018   Lab Results  Component Value Date   IRON 59 09/07/2018   FERRITIN 87.1 09/07/2018   Attestation Statements:   This is the patient's first visit at [facility]. The patient's NEW PATIENT PACKET was reviewed at length. Included in the packet: current and past health history, medications, allergies, ROS, gynecologic history (women only), surgical history, family history, social history, weight history, weight loss surgery history (for those that have had weight loss surgery), nutritional evaluation, mood and food questionnaire, PHQ9, Epworth questionnaire, sleep habits questionnaire, patient life and health improvement goals questionnaire. These will all be scanned into the patient's chart under media.   During the visit, I independently reviewed the patient's EKG, bioimpedance scale results, and indirect calorimeter results. I used this information to tailor a meal plan for the patient that will help her to lose weight and will improve her obesity-related conditions going forward. I performed a medically necessary appropriate examination and/or evaluation. I discussed the assessment and  treatment plan with the patient. The patient was provided an opportunity to ask questions and all were answered. The patient agreed with the plan and demonstrated an understanding of the instructions. Labs were ordered at this visit and will be reviewed at the next visit unless more critical results need to be addressed immediately. Clinical information was updated and documented in the EMR.   Time spent on visit including pre-visit chart review  and post-visit charting and care was 60 minutes.   I, Water quality scientist, CMA, am acting as transcriptionist for Briscoe Deutscher, DO  I have reviewed the above documentation for accuracy and completeness, and I agree with the above. Briscoe Deutscher, DO

## 2019-06-21 LAB — COMPREHENSIVE METABOLIC PANEL
ALT: 28 IU/L (ref 0–32)
AST: 34 IU/L (ref 0–40)
Albumin/Globulin Ratio: 1.4 (ref 1.2–2.2)
Albumin: 4.2 g/dL (ref 3.7–4.7)
Alkaline Phosphatase: 81 IU/L (ref 48–121)
BUN/Creatinine Ratio: 27 (ref 12–28)
BUN: 17 mg/dL (ref 8–27)
Bilirubin Total: 0.5 mg/dL (ref 0.0–1.2)
CO2: 24 mmol/L (ref 20–29)
Calcium: 9.5 mg/dL (ref 8.7–10.3)
Chloride: 102 mmol/L (ref 96–106)
Creatinine, Ser: 0.62 mg/dL (ref 0.57–1.00)
GFR calc Af Amer: 103 mL/min/{1.73_m2} (ref >59)
GFR calc non Af Amer: 90 mL/min/{1.73_m2} (ref >59)
Globulin, Total: 3 g/dL (ref 1.5–4.5)
Glucose: 79 mg/dL (ref 65–99)
Potassium: 3.8 mmol/L (ref 3.5–5.2)
Sodium: 144 mmol/L (ref 134–144)
Total Protein: 7.2 g/dL (ref 6.0–8.5)

## 2019-06-21 LAB — CBC WITH DIFFERENTIAL/PLATELET
Basophils Absolute: 0.1 10*3/uL (ref 0.0–0.2)
Basos: 1 %
EOS (ABSOLUTE): 0.2 10*3/uL (ref 0.0–0.4)
Eos: 3 %
Hematocrit: 44.5 % (ref 34.0–46.6)
Hemoglobin: 14.9 g/dL (ref 11.1–15.9)
Immature Grans (Abs): 0 10*3/uL (ref 0.0–0.1)
Immature Granulocytes: 1 %
Lymphocytes Absolute: 2.4 10*3/uL (ref 0.7–3.1)
Lymphs: 31 %
MCH: 29.7 pg (ref 26.6–33.0)
MCHC: 33.5 g/dL (ref 31.5–35.7)
MCV: 89 fL (ref 79–97)
Monocytes Absolute: 0.7 10*3/uL (ref 0.1–0.9)
Monocytes: 9 %
Neutrophils Absolute: 4.4 10*3/uL (ref 1.4–7.0)
Neutrophils: 55 %
Platelets: 189 10*3/uL (ref 150–450)
RBC: 5.02 x10E6/uL (ref 3.77–5.28)
RDW: 13.6 % (ref 11.7–15.4)
WBC: 7.8 10*3/uL (ref 3.4–10.8)

## 2019-06-21 LAB — INSULIN, RANDOM: INSULIN: 20.9 u[IU]/mL (ref 2.6–24.9)

## 2019-06-21 LAB — T3: T3, Total: 154 ng/dL (ref 71–180)

## 2019-06-21 LAB — TSH: TSH: 3.06 u[IU]/mL (ref 0.450–4.500)

## 2019-06-21 LAB — T4, FREE: Free T4: 1.01 ng/dL (ref 0.82–1.77)

## 2019-07-02 ENCOUNTER — Ambulatory Visit: Payer: Medicare Other | Admitting: Gastroenterology

## 2019-07-02 ENCOUNTER — Other Ambulatory Visit: Payer: Self-pay

## 2019-07-04 ENCOUNTER — Other Ambulatory Visit: Payer: Self-pay

## 2019-07-04 ENCOUNTER — Ambulatory Visit (INDEPENDENT_AMBULATORY_CARE_PROVIDER_SITE_OTHER): Payer: Medicare Other | Admitting: Family Medicine

## 2019-07-04 ENCOUNTER — Encounter (INDEPENDENT_AMBULATORY_CARE_PROVIDER_SITE_OTHER): Payer: Self-pay | Admitting: Family Medicine

## 2019-07-04 VITALS — BP 138/78 | HR 70 | Temp 97.6°F | Ht 61.0 in | Wt 212.0 lb

## 2019-07-04 DIAGNOSIS — R7303 Prediabetes: Secondary | ICD-10-CM | POA: Diagnosis not present

## 2019-07-04 DIAGNOSIS — Z6841 Body Mass Index (BMI) 40.0 and over, adult: Secondary | ICD-10-CM | POA: Diagnosis not present

## 2019-07-04 DIAGNOSIS — G4710 Hypersomnia, unspecified: Secondary | ICD-10-CM | POA: Diagnosis not present

## 2019-07-04 DIAGNOSIS — K760 Fatty (change of) liver, not elsewhere classified: Secondary | ICD-10-CM

## 2019-07-04 DIAGNOSIS — E8881 Metabolic syndrome: Secondary | ICD-10-CM

## 2019-07-04 MED ORDER — TRULICITY 0.75 MG/0.5ML ~~LOC~~ SOAJ: 0.7500 mg | SUBCUTANEOUS | 0 refills | Status: DC

## 2019-07-08 NOTE — Progress Notes (Signed)
Chief Complaint:   OBESITY Morgan Hurst is here to discuss her progress with her obesity treatment plan along with follow-up of her obesity related diagnoses. Morgan Hurst is on the Category 3 Plan and states she is following her eating plan approximately 45% of the time. Morgan Hurst states she is exercising for 0 minutes 0 times per week.  Today's visit was #: 2 Starting weight: 213 lbs Starting date: 06/20/2019 Today's weight: 212 lbs Today's date: 07/04/2019 Total lbs lost to date: 1 lb Total lbs lost since last in-office visit: 1 lb  Interim History: Morgan Hurst says she struggled to have all the food.  She says she felt too bloated and nauseated.  She decreased the food, but journaled.  Subjective:   1. Prediabetes Morgan Hurst has a diagnosis of prediabetes based on her elevated HgA1c and was informed this puts her at greater risk of developing diabetes. She continues to work on diet and exercise to decrease her risk of diabetes. She denies nausea or hypoglycemia.  Lab Results  Component Value Date   HGBA1C 5.9 05/15/2019   Lab Results  Component Value Date   INSULIN 20.9 06/20/2019   2. NAFLD (nonalcoholic fatty liver disease) Morgan Hurst has the diagnosis of NAFLD. Her BMI is 40.1. She denies abdominal pain or jaundice and has never been told of any liver problems in the past. She denies excessive alcohol intake.  Lab Results  Component Value Date   ALT 28 06/20/2019   AST 34 06/20/2019   ALKPHOS 81 06/20/2019   BILITOT 0.5 06/20/2019   3. Excessive sleepiness Morgan Hurst suffers from excessive daytime sleepiness.  4. Metabolic syndrome Risk factors (3 or more constitute Metabolic Syndrome): waist > 35" for female impaired fasting blood sugar on cholesterol medication.  Assessment/Plan:   1. Prediabetes Morgan Hurst will continue to work on weight loss, exercise, and decreasing simple carbohydrates to help decrease the risk of diabetes.   2. NAFLD (nonalcoholic fatty liver disease) We discussed the  diagnosis of non-alcoholic fatty liver disease today and how this condition is obesity related. Morgan Hurst was educated the importance of weight loss. Morgan Hurst agreed to continue with her weight loss efforts with healthier diet and exercise as an essential part of her treatment plan.  3. Excessive sleepiness Will continue to monitor.  4. Metabolic syndrome Counseling: Intensive lifestyle modifications are the first line treatment for this issue. We discussed several lifestyle modifications today and she will continue to work on diet, exercise and weight loss efforts. We will continue to monitor. Orders and follow up as documented in patient record.  Counseling  Metabolic syndrome is a combination of conditions such as abdominal obesity, high blood sugar, high blood pressure, and unhealthy levels of cholesterol. Metabolic syndrome raises your risk of developing heart disease, diabetes, and stroke.  Following a heart-healthy diet can help you lower your risk of heart disease and improve metabolic syndrome. This diet includes lean proteins, healthy fats, nuts, and plenty of fruits and vegetables.  Along with regular exercise, following a healthy diet can help your body use insulin better and help you lose weight.  A common goal for people with metabolic syndrome is to lose 7-10% of their starting weight.  Orders - Morgan Hurst (TRULICITY) 6.25 WL/8.9HT SOPN; Inject 0.5 mLs (0.75 mg total) into the skin once a week.  Dispense: 4 pen; Refill: 0  5. Class 3 severe obesity with serious comorbidity and body mass index (BMI) of 40.0 to 44.9 in adult, unspecified obesity type (HCC) Morgan Hurst is currently in  the action stage of change. As such, her goal is to continue with weight loss efforts. She has agreed to keeping a food journal and adhering to recommended goals of 1500 calories and 95+ grams of protein.   Exercise goals: No exercise has been prescribed at this time.  Behavioral modification strategies:  increasing lean protein intake and meal planning and cooking strategies.  Morgan Hurst has agreed to follow-up with our clinic in 3-4 weeks. She was informed of the importance of frequent follow-up visits to maximize her success with intensive lifestyle modifications for her multiple health conditions.   Objective:   Blood pressure 138/78, pulse 70, temperature 97.6 F (36.4 C), temperature source Oral, height 5' 1"  (1.549 m), weight 212 lb (96.2 kg), SpO2 97 %. Body mass index is 40.06 kg/m.  General: Cooperative, alert, well developed, in no acute distress. HEENT: Conjunctivae and lids unremarkable. Cardiovascular: Regular rhythm.  Lungs: Normal work of breathing. Neurologic: No focal deficits.   Lab Results  Component Value Date   CREATININE 0.62 06/20/2019   BUN 17 06/20/2019   NA 144 06/20/2019   K 3.8 06/20/2019   CL 102 06/20/2019   CO2 24 06/20/2019   Lab Results  Component Value Date   ALT 28 06/20/2019   AST 34 06/20/2019   ALKPHOS 81 06/20/2019   BILITOT 0.5 06/20/2019   Lab Results  Component Value Date   HGBA1C 5.9 05/15/2019   HGBA1C 6.0 09/04/2018   HGBA1C 5.9 12/25/2017   HGBA1C 6.0 04/11/2017   HGBA1C 5.7 02/22/2016   Lab Results  Component Value Date   INSULIN 20.9 06/20/2019   Lab Results  Component Value Date   TSH 3.060 06/20/2019   Lab Results  Component Value Date   CHOL 191 05/15/2019   HDL 45.60 05/15/2019   LDLCALC 118 (H) 05/15/2019   LDLDIRECT 177.7 12/25/2012   TRIG 137.0 05/15/2019   CHOLHDL 4 05/15/2019   Lab Results  Component Value Date   WBC 7.8 06/20/2019   HGB 14.9 06/20/2019   HCT 44.5 06/20/2019   MCV 89 06/20/2019   PLT 189 06/20/2019   Lab Results  Component Value Date   IRON 59 09/07/2018   FERRITIN 87.1 09/07/2018   Obesity Behavioral Intervention:   Approximately 15 minutes were spent on the discussion below.  ASK: We discussed the diagnosis of obesity with Morgan Hurst today and Morgan Hurst agreed to give [place]  permission to discuss obesity behavioral modification therapy today.  ASSESS: Morgan Hurst has the diagnosis of obesity and her BMI today is 40.1. Morgan Hurst is in the action stage of change.   ADVISE: Inna was educated on the multiple health risks of obesity as well as the benefit of weight loss to improve her health. She was advised of the need for long term treatment and the importance of lifestyle modifications to improve her current health and to decrease her risk of future health problems.  AGREE: Multiple dietary modification options and treatment options were discussed and Evita agreed to follow the recommendations documented in the above note.  ARRANGE: Felesha was educated on the importance of frequent visits to treat obesity as outlined per CMS and USPSTF guidelines and agreed to schedule her next follow up appointment today.  Attestation Statements:   Reviewed by clinician on day of visit: allergies, medications, problem list, medical history, surgical history, family history, social history, and previous encounter notes.  I, Water quality scientist, CMA, am acting as transcriptionist for Briscoe Deutscher, DO  I have reviewed the above documentation  for accuracy and completeness, and I agree with the above. Briscoe Deutscher, DO

## 2019-07-29 ENCOUNTER — Encounter (INDEPENDENT_AMBULATORY_CARE_PROVIDER_SITE_OTHER): Payer: Self-pay | Admitting: Family Medicine

## 2019-07-29 ENCOUNTER — Other Ambulatory Visit: Payer: Self-pay

## 2019-07-29 ENCOUNTER — Ambulatory Visit (INDEPENDENT_AMBULATORY_CARE_PROVIDER_SITE_OTHER): Payer: Medicare Other | Admitting: Family Medicine

## 2019-07-29 VITALS — BP 120/76 | HR 71 | Temp 97.9°F | Ht 61.0 in | Wt 206.0 lb

## 2019-07-29 DIAGNOSIS — Z6839 Body mass index (BMI) 39.0-39.9, adult: Secondary | ICD-10-CM

## 2019-07-29 DIAGNOSIS — E8881 Metabolic syndrome: Secondary | ICD-10-CM

## 2019-07-30 MED ORDER — TRULICITY 1.5 MG/0.5ML ~~LOC~~ SOAJ: 1.5000 mg | SUBCUTANEOUS | 0 refills | Status: DC

## 2019-07-30 NOTE — Progress Notes (Signed)
Chief Complaint:   OBESITY Morgan Hurst is here to discuss her progress with her obesity treatment plan along with follow-up of her obesity related diagnoses. Morgan Hurst is on the Category 3 Plan and states she is following her eating plan approximately 70-80% of the time. Morgan Hurst states she is exercising for 0 minutes 0 times per week.  Today's visit was #: 3 Starting weight: 213 lbs Starting date: 06/20/2019 Today's weight: 206 lbs Today's date: 07/29/2019 Total lbs lost to date: 7 lbs Total lbs lost since last in-office visit: 6 lbs  Interim History: Morgan Hurst says she is happy with the plan.  We reviewed her food log. She is generally meeting recommended goals.  Subjective:   1. Metabolic syndrome Risk factors (3 or more constitute Metabolic Syndrome): waist > 35" for female impaired fasting blood sugar on blood pressure medication.  Assessment/Plan:   1. Metabolic syndrome Counseling: Intensive lifestyle modifications are the first line treatment for this issue. We discussed several lifestyle modifications today and she will continue to work on diet, exercise and weight loss efforts. We will continue to monitor. Orders and follow up as documented in patient record.  Counseling  Metabolic syndrome is a combination of conditions such as abdominal obesity, high blood sugar, high blood pressure, and unhealthy levels of cholesterol. Metabolic syndrome raises your risk of developing heart disease, diabetes, and stroke.  Following a heart-healthy diet can help you lower your risk of heart disease and improve metabolic syndrome. This diet includes lean proteins, healthy fats, nuts, and plenty of fruits and vegetables.  Along with regular exercise, following a healthy diet can help your body use insulin better and help you lose weight.  A common goal for people with metabolic syndrome is to lose 7-10% of their starting weight.  Orders - Dulaglutide (TRULICITY) 1.5 YB/0.1BP SOPN; Inject 0.5 mLs  (1.5 mg total) into the skin once a week.  Dispense: 4 pen; Refill: 0  2. Class 2 severe obesity with serious comorbidity and body mass index (BMI) of 39.0 to 39.9 in adult, unspecified obesity type (HCC) Morgan Hurst is currently in the action stage of change. As such, her goal is to continue with weight loss efforts. She has agreed to the Category 3 Plan.   Exercise goals: Older adults should follow the adult guidelines. When older adults cannot meet the adult guidelines, they should be as physically active as their abilities and conditions will allow.  Older adults should do exercises that maintain or improve balance if they are at risk of falling.   Behavioral modification strategies: increasing lean protein intake.  Morgan Hurst has agreed to follow-up with our clinic in 2-3 weeks. She was informed of the importance of frequent follow-up visits to maximize her success with intensive lifestyle modifications for her multiple health conditions.   Objective:   Blood pressure 120/76, pulse 71, temperature 97.9 F (36.6 C), temperature source Oral, height 5' 1"  (1.549 m), weight 206 lb (93.4 kg), SpO2 94 %. Body mass index is 38.92 kg/m.  General: Cooperative, alert, well developed, in no acute distress. HEENT: Conjunctivae and lids unremarkable. Cardiovascular: Regular rhythm.  Lungs: Normal work of breathing. Neurologic: No focal deficits.   Lab Results  Component Value Date   CREATININE 0.62 06/20/2019   BUN 17 06/20/2019   NA 144 06/20/2019   K 3.8 06/20/2019   CL 102 06/20/2019   CO2 24 06/20/2019   Lab Results  Component Value Date   ALT 28 06/20/2019   AST 34 06/20/2019  ALKPHOS 81 06/20/2019   BILITOT 0.5 06/20/2019   Lab Results  Component Value Date   HGBA1C 5.9 05/15/2019   HGBA1C 6.0 09/04/2018   HGBA1C 5.9 12/25/2017   HGBA1C 6.0 04/11/2017   HGBA1C 5.7 02/22/2016   Lab Results  Component Value Date   INSULIN 20.9 06/20/2019   Lab Results  Component Value Date    TSH 3.060 06/20/2019   Lab Results  Component Value Date   CHOL 191 05/15/2019   HDL 45.60 05/15/2019   LDLCALC 118 (H) 05/15/2019   LDLDIRECT 177.7 12/25/2012   TRIG 137.0 05/15/2019   CHOLHDL 4 05/15/2019   Lab Results  Component Value Date   WBC 7.8 06/20/2019   HGB 14.9 06/20/2019   HCT 44.5 06/20/2019   MCV 89 06/20/2019   PLT 189 06/20/2019   Lab Results  Component Value Date   IRON 59 09/07/2018   FERRITIN 87.1 09/07/2018   Obesity Behavioral Intervention:   Approximately 15 minutes were spent on the discussion below.  ASK: We discussed the diagnosis of obesity with Morgan Hurst today and Morgan Hurst agreed to give [place] permission to discuss obesity behavioral modification therapy today.  ASSESS: Morgan Hurst has the diagnosis of obesity and her BMI today is 39.0. Morgan Hurst is in the action stage of change.   ADVISE: Morgan Hurst was educated on the multiple health risks of obesity as well as the benefit of weight loss to improve her health. She was advised of the need for long term treatment and the importance of lifestyle modifications to improve her current health and to decrease her risk of future health problems.  AGREE: Multiple dietary modification options and treatment options were discussed and Morgan Hurst agreed to follow the recommendations documented in the above note.  ARRANGE: Morgan Hurst was educated on the importance of frequent visits to treat obesity as outlined per CMS and USPSTF guidelines and agreed to schedule her next follow up appointment today.  Attestation Statements:   Reviewed by clinician on day of visit: allergies, medications, problem list, medical history, surgical history, family history, social history, and previous encounter notes.  I, Water quality scientist, CMA, am acting as transcriptionist for Briscoe Deutscher, DO  I have reviewed the above documentation for accuracy and completeness, and I agree with the above. Briscoe Deutscher, DO

## 2019-08-04 ENCOUNTER — Encounter (INDEPENDENT_AMBULATORY_CARE_PROVIDER_SITE_OTHER): Payer: Self-pay | Admitting: Family Medicine

## 2019-08-19 ENCOUNTER — Ambulatory Visit (INDEPENDENT_AMBULATORY_CARE_PROVIDER_SITE_OTHER): Payer: Medicare Other | Admitting: Family Medicine

## 2019-08-21 DIAGNOSIS — K7581 Nonalcoholic steatohepatitis (NASH): Secondary | ICD-10-CM | POA: Diagnosis not present

## 2019-08-22 ENCOUNTER — Other Ambulatory Visit: Payer: Self-pay | Admitting: Gastroenterology

## 2019-08-22 DIAGNOSIS — E785 Hyperlipidemia, unspecified: Secondary | ICD-10-CM | POA: Diagnosis not present

## 2019-08-22 DIAGNOSIS — Z79899 Other long term (current) drug therapy: Secondary | ICD-10-CM | POA: Diagnosis not present

## 2019-08-22 DIAGNOSIS — K760 Fatty (change of) liver, not elsewhere classified: Secondary | ICD-10-CM | POA: Diagnosis not present

## 2019-08-22 DIAGNOSIS — R7303 Prediabetes: Secondary | ICD-10-CM | POA: Diagnosis not present

## 2019-08-27 ENCOUNTER — Encounter (INDEPENDENT_AMBULATORY_CARE_PROVIDER_SITE_OTHER): Payer: Self-pay

## 2019-08-27 ENCOUNTER — Other Ambulatory Visit: Payer: Self-pay | Admitting: Family

## 2019-08-27 ENCOUNTER — Ambulatory Visit (INDEPENDENT_AMBULATORY_CARE_PROVIDER_SITE_OTHER): Payer: Medicare Other

## 2019-08-27 ENCOUNTER — Other Ambulatory Visit: Payer: Self-pay

## 2019-08-27 ENCOUNTER — Telehealth (INDEPENDENT_AMBULATORY_CARE_PROVIDER_SITE_OTHER): Payer: Self-pay | Admitting: Family Medicine

## 2019-08-27 ENCOUNTER — Other Ambulatory Visit (INDEPENDENT_AMBULATORY_CARE_PROVIDER_SITE_OTHER): Payer: Self-pay | Admitting: Family Medicine

## 2019-08-27 VITALS — Ht 61.0 in | Wt 206.0 lb

## 2019-08-27 DIAGNOSIS — Z0000 Encounter for general adult medical examination without abnormal findings: Secondary | ICD-10-CM

## 2019-08-27 DIAGNOSIS — E8881 Metabolic syndrome: Secondary | ICD-10-CM

## 2019-08-27 DIAGNOSIS — Z760 Encounter for issue of repeat prescription: Secondary | ICD-10-CM

## 2019-08-27 DIAGNOSIS — M48062 Spinal stenosis, lumbar region with neurogenic claudication: Secondary | ICD-10-CM

## 2019-08-27 NOTE — Progress Notes (Addendum)
Subjective:   Morgan Hurst is a 74 y.o. female who presents for Medicare Annual (Subsequent) preventive examination.  Review of Systems    No ROS.  Medicare Wellness Virtual Visit.       Objective:    Today's Vitals   08/27/19 1105  Weight: 206 lb (93.4 kg)  Height: 5' 1"  (1.549 m)   Body mass index is 38.92 kg/m.  Advanced Directives 08/27/2019 08/24/2018 08/22/2017 07/10/2017 08/11/2016 08/12/2015 05/12/2015  Does Patient Have a Medical Advance Directive? Yes Yes Yes Yes Yes Yes Yes  Type of Advance Directive Living will [place];Living will [place];Living will - [place];Living will - Living will;Healthcare Power of Attorney  Does patient want to make changes to medical advance directive? No - Patient declined No - Patient declined No - Patient declined - No - Patient declined - -  Copy of [place] in Chart? - No - copy requested No - copy requested - No - copy requested No - copy requested No - copy requested    Current Medications (verified) Outpatient Encounter Medications as of 08/27/2019  Medication Sig  . aspirin 81 MG tablet Take 81 mg by mouth daily.  . calcium carbonate (OSCAL) 1500 (600 Ca) MG TABS tablet Take 600 mg by mouth 2 (two) times daily with a meal.  . cholecalciferol (VITAMIN D3) 25 MCG (1000 UNIT) tablet Take 2,000 Units by mouth daily.  . Citicoline (COGNITIVE HEALTH PO) Take 1 tablet by mouth in the morning and at bedtime.  . colesevelam (WELCHOL) 625 MG tablet Take 625 mg by mouth daily.  . cyanocobalamin (,VITAMIN B-12,) 1000 MCG/ML injection INJECT 1 ML ONCE PER MONTH  . Dulaglutide (TRULICITY) 1.5 SW/5.4OE SOPN Inject 0.5 mLs (1.5 mg total) into the skin once a week.  . esomeprazole (NEXIUM) 40 MG capsule Take by mouth.  . meloxicam (MOBIC) 7.5 MG tablet Take 1 tablet (7.5 mg total) by mouth daily.  . metFORMIN (GLUCOPHAGE) 500 MG tablet Take 500 mg by mouth daily. Patient  taking 2-3 times weekly.  . metolazone (ZAROXOLYN) 5 MG tablet Take 5 mg by mouth daily.  . ondansetron (ZOFRAN) 4 MG tablet Take 1 tablet (4 mg total) by mouth every 8 (eight) hours as needed for nausea or vomiting.  Marland Kitchen rOPINIRole (REQUIP) 0.25 MG tablet TAKE 1 TABLET(0.25 MG) BY MOUTH AT BEDTIME AS NEEDED  . traMADol (ULTRAM) 50 MG tablet TAKE 1 TABLET BY MOUTH DAILY AS NEEDED  . traZODone (DESYREL) 50 MG tablet Take 1 tablet (50 mg total) by mouth at bedtime. (Patient taking differently: Take 50 mg by mouth at bedtime. )   No facility-administered encounter medications on file as of 08/27/2019.    Allergies (verified) Patient has no known allergies.   History: Past Medical History:  Diagnosis Date  . Arthritis    lower back, right hip  . B12 deficiency   . Back pain   . Borderline diabetes    PCP STARTED PT ON METFORMIN IN 2016 DUE TO ELEVATED GLUCOSE   . Complication of anesthesia    FIGHTING WHEN WAKING UP FROM ANESTHESIA  . Constipation   . Diabetes mellitus without complication ([place])   . Endometriosis   . Fatty liver   . Gallbladder problem   . GERD (gastroesophageal reflux disease)   . Heart murmur   . High cholesterol   . Hip pain   . Joint pain   . Kidney stones   .  Leg weakness   . Orthodontics    top front 3 teeth caps and bridge  . Prediabetes   . RA (rheumatoid arthritis) ([place])   . SOB (shortness of breath)   . Stomach ulcer   . Swelling   . Vitamin D deficiency    Past Surgical History:  Procedure Laterality Date  . ABDOMINAL HYSTERECTOMY  age 51  . ANKLE SURGERY    . CHOLECYSTECTOMY N/A 04/10/2015   Procedure: LAPAROSCOPIC CHOLECYSTECTOMY WITH INTRAOPERATIVE CHOLANGIOGRAM;  Surgeon: Robert Bellow, MD;  Location: [facility] ORS;  Service: General;  Laterality: N/A;  . COLONOSCOPY WITH PROPOFOL N/A 07/10/2017   Procedure: COLONOSCOPY WITH PROPOFOL;  Surgeon: Jonathon Bellows, MD;  Location: [facility] ENDOSCOPY;  Service: Gastroenterology;  Laterality: N/A;  .  ESOPHAGOGASTRODUODENOSCOPY N/A 05/12/2015   Procedure: ESOPHAGOGASTRODUODENOSCOPY (EGD);  Surgeon: Hulen Luster, MD;  Location: [place];  Service: Gastroenterology;  Laterality: N/A;  . VAGINAL DELIVERY  2   Family History  Problem Relation Age of Onset  . Diabetes Mother   . Cancer Mother        colon and breast  . Breast cancer Mother   . Liver disease Mother   . Cancer Father        Leukemia  . Cancer Brother        colon and lung  . Cholelithiasis Daughter   . Heart disease Paternal Grandfather   . Cancer Other 29       colon   Social History   Socioeconomic History  . Marital status: Widowed    Spouse name: Not on file  . Number of children: 2  . Years of education: Not on file  . Highest education level: Not on file  Occupational History  . Occupation: Retired  Tobacco Use  . Smoking status: Never Smoker  . Smokeless tobacco: Never Used  Vaping Use  . Vaping Use: Never used  Substance and Sexual Activity  . Alcohol use: No  . Drug use: No  . Sexual activity: Not Currently  Other Topics Concern  . Not on file  Social History Narrative   [place]       Husband passed away 29-Mar-2014 from PNA.      Work - retired from [employer] - regular; working on [employer]   Exercise - walks occasionally, limited by fatigue. 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    Social Determinants of Health   Financial Resource Strain: Low Risk   . Difficulty of Paying Living Expenses: Not hard at all  Food Insecurity:   . Worried About Charity fundraiser in the Last Year:   . Arboriculturist in the Last Year:   Transportation Needs: No Transportation Needs  . Lack of Transportation (Medical): No  . Lack of Transportation (Non-Medical): No  Physical Activity:   . Days of Exercise per Week:   . Minutes of Exercise per Session:   Stress: No Stress Concern Present  . Feeling of Stress : Not at all  Social Connections: Moderately  Integrated  . Frequency of Communication with Friends and Family: More than three times a week  . Frequency of Social Gatherings with Friends and Family: More than three times a week  . Attends Religious Services: 1 to 4 times per year  . Active Member of Clubs or Organizations: Yes  . Attends Archivist Meetings: 1 to 4 times per year  .  Marital Status: Widowed    Tobacco Counseling Counseling given: Not Answered   Clinical Intake:  Pre-visit preparation completed: Yes        Diabetes: No  How often do you need to have someone help you when you read instructions, pamphlets, or other written materials from your doctor or pharmacy?: 1 - Never  Interpreter Needed?: No      Activities of Daily Living In your present state of health, do you have any difficulty performing the following activities: 08/27/2019  Hearing? N  Vision? N  Difficulty concentrating or making decisions? N  Walking or climbing stairs? N  Dressing or bathing? N  Doing errands, shopping? N  Preparing Food and eating ? N  Using the Toilet? N  In the past six months, have you accidently leaked urine? N  Do you have problems with loss of bowel control? N  Managing your Medications? N  Managing your Finances? N  Housekeeping or managing your Housekeeping? N  Some recent data might be hidden    Patient Care Team: Burnard Hawthorne, FNP as PCP - General (Family Medicine) Jackolyn Confer, MD (Internal Medicine) Bary Castilla Forest Gleason, MD (General Surgery)  Indicate any recent Medical Services you may have received from other than [facility] providers in the past year (date may be approximate).     Assessment:   This is a routine wellness examination for Morgan Hurst.  I connected with Morgan Hurst today by telephone and verified that I am speaking with the correct person using two identifiers. Location patient: home Location provider: work Persons participating in the virtual visit: patient, Marine scientist.    I  discussed the limitations, risks, security and privacy concerns of performing an evaluation and management service by telephone and the availability of in person appointments. The patient expressed understanding and verbally consented to this telephonic visit.    Interactive audio and video telecommunications were attempted between this provider and patient, however failed, due to patient having technical difficulties OR patient did not have access to video capability.  We continued and completed visit with audio only.  Some vital signs may be absent or patient reported.   Hearing/Vision screen  Hearing Screening   125Hz  250Hz  500Hz  1000Hz  2000Hz  3000Hz  4000Hz  6000Hz  8000Hz   Right ear:           Left ear:           Comments: Patient is able to hear conversational tones without difficulty.  No issues reported.   Vision Screening Comments: Wears corrective lenses Visual acuity not assessed, virtual visit.  They have seen their ophthalmologist in the last 12 months.    Dietary issues and exercise activities discussed: Current Exercise Habits: Home exercise routine, Type of exercise: walking, Intensity: Mild  1400 caloric intake healthy diet Good water intake Caffeine- 1 cup of coffee  Goals    . Weight goal 180-200lb     Continue making healthy meal choices.   Stay hydrated and drink plenty of water. Stay active.  Stretch.  Daily chair exercises when unable to walk.       Depression Screen PHQ 2/9 Scores 08/27/2019 06/20/2019 05/15/2019 03/13/2019 08/24/2018 12/25/2017 08/22/2017  PHQ - 2 Score 0 2 0 0 0 1 0  PHQ- 9 Score - 3 1 0 - 8 -  Exception Documentation - Medical reason - - - - -    Fall Risk Fall Risk  08/27/2019 03/13/2019 08/24/2018 08/22/2017 10/13/2016  Falls in the past year? 0 0 0 No No  Number falls in past yr: - 0 - - -  Injury with Fall? - 0 - - -  Follow up Falls evaluation completed Falls evaluation completed - - -   Handrails in use when climbing stairs? Yes  Home free  of loose throw rugs in walkways, pet beds, electrical cords, etc? Yes  Adequate lighting in your home to reduce risk of falls? Yes   ASSISTIVE DEVICES UTILIZED TO PREVENT FALLS:  Life alert? No  Use of a cane, walker or w/c? No  Grab bars in the bathroom? No  Shower chair or bench in shower? No  Elevated toilet seat or a handicapped toilet? No   TIMED UP AND GO:  Was the test performed? No .   Cognitive Function: MMSE - Mini Mental State Exam 08/22/2017 08/11/2016 08/12/2015  Orientation to time 5 5 5   Orientation to Place 5 5 5   Registration 3 3 3   Attention/ Calculation 5 5 5   Recall 3 3 3   Language- name 2 objects 2 2 2   Language- repeat 1 1 1   Language- follow 3 step command 3 3 3   Language- read & follow direction 1 1 1   Write a sentence 1 1 1   Copy design 1 1 1   Total score 30 30 30      6CIT Screen 08/27/2019 08/24/2018  What Year? 0 points 0 points  What month? 0 points 0 points  What time? - 0 points  Count back from 20 - 0 points  Months in reverse 0 points 0 points  Repeat phrase - 0 points  Total Score - 0    Immunizations Immunization History  Administered Date(s) Administered  . Influenza Split 12/26/2011  . Influenza, High Dose Seasonal PF 11/25/2015, 10/13/2016, 09/26/2017, 11/30/2018  . Influenza,inj,Quad PF,6+ Mos 12/25/2012, 03/18/2014, 10/07/2014  . Pneumococcal Conjugate-13 03/18/2014  . Pneumococcal Polysaccharide-23 12/26/2011  . Tdap 12/25/2009  . Zoster 12/26/2010   Health Maintenance Health Maintenance  Topic Date Due  . FOOT EXAM  Never done  . COVID-19 Vaccine (1) Never done  . OPHTHALMOLOGY EXAM  08/18/2015  . INFLUENZA VACCINE  08/18/2019  . URINE MICROALBUMIN  09/04/2019  . HEMOGLOBIN A1C  11/14/2019  . TETANUS/TDAP  12/26/2019  . MAMMOGRAM  02/12/2020  . COLONOSCOPY  07/11/2022  . DEXA SCAN  Completed  . Hepatitis C Screening  Completed  . PNA vac Low Risk Adult  Completed   Covid vaccine- completed. Agrees to update  immunization record.   Dental Screening: Recommended annual dental exams for proper oral hygiene. Visits every 6 months.  Community Resource Referral / Chronic Care Management: CRR required this visit?  No   CCM required this visit?  No      Plan:   Keep all routine maintenance appointments.   Follow up 09/25/19 @ 11:30  I have personally reviewed and noted the following in the patient's chart:   . Medical and social history . Use of alcohol, tobacco or illicit drugs  . Current medications and supplements . Functional ability and status . Nutritional status . Physical activity . Advanced directives . List of other physicians . Hospitalizations, surgeries, and ER visits in previous 12 months . Vitals . Screenings to include cognitive, depression, and falls . Referrals and appointments  In addition, I have reviewed and discussed with patient certain preventive protocols, quality metrics, and best practice recommendations. A written personalized care plan for preventive services as well as general preventive health recommendations were provided to patient via mychart.  Varney Biles, LPN   1/64/2903     Agree with plan. Mable Paris, NP

## 2019-08-27 NOTE — Telephone Encounter (Signed)
RX sent in RX encounter

## 2019-08-27 NOTE — Telephone Encounter (Signed)
My chart message sent to pt.

## 2019-08-27 NOTE — Patient Instructions (Addendum)
Morgan Hurst , Thank you for taking time to come for your Medicare Wellness Visit. I appreciate your ongoing commitment to your health goals. Please review the following plan we discussed and let me know if I can assist you in the future.   These are the goals we discussed: Goals    . Weight goal 180-200lb     Continue making healthy meal choices.   Stay hydrated and drink plenty of water. Stay active.  Stretch.  Daily chair exercises when unable to walk.        This is a list of the screening recommended for you and due dates:  Health Maintenance  Topic Date Due  . Complete foot exam   Never done  . COVID-19 Vaccine (1) Never done  . Eye exam for diabetics  08/18/2015  . Flu Shot  08/18/2019  . Urine Protein Check  09/04/2019  . Hemoglobin A1C  11/14/2019  . Tetanus Vaccine  12/26/2019  . Mammogram  02/12/2020  . Colon Cancer Screening  07/11/2022  . DEXA scan (bone density measurement)  Completed  .  Hepatitis C: One time screening is recommended by Center for Disease Control  (CDC) for  adults born from [birth year] through 1964.   Completed  . Pneumonia vaccines  Completed    Immunizations Immunization History  Administered Date(s) Administered  . Influenza Split 12/26/2011  . Influenza, High Dose Seasonal PF 11/25/2015, 10/13/2016, 09/26/2017, 11/30/2018  . Influenza,inj,Quad PF,6+ Mos 12/25/2012, 03/18/2014, 10/07/2014  . Pneumococcal Conjugate-13 03/18/2014  . Pneumococcal Polysaccharide-23 12/26/2011  . Tdap 12/25/2009  . Zoster 12/26/2010   Advanced directives: End of life planning; Advance aging; Advanced directives discussed.  Copy of current HCPOA/Living Will requested.    Conditions/risks identified: none new  Follow up in one year for your annual wellness visit.   Preventive Care 56 Years and Older, Female Preventive care refers to lifestyle choices and visits with your health care provider that can promote health and wellness. What does preventive care  include?  A yearly physical exam. This is also called an annual well check.  Dental exams once or twice a year.  Routine eye exams. Ask your health care provider how often you should have your eyes checked.  Personal lifestyle choices, including:  Daily care of your teeth and gums.  Regular physical activity.  Eating a healthy diet.  Avoiding tobacco and drug use.  Limiting alcohol use.  Practicing safe sex.  Taking low-dose aspirin every day.  Taking vitamin and mineral supplements as recommended by your health care provider. What happens during an annual well check? The services and screenings done by your health care provider during your annual well check will depend on your age, overall health, lifestyle risk factors, and family history of disease. Counseling  Your health care provider may ask you questions about your:  Alcohol use.  Tobacco use.  Drug use.  Emotional well-being.  Home and relationship well-being.  Sexual activity.  Eating habits.  History of falls.  Memory and ability to understand (cognition).  Work and work Statistician.  Reproductive health. Screening  You may have the following tests or measurements:  Height, weight, and BMI.  Blood pressure.  Lipid and cholesterol levels. These may be checked every 5 years, or more frequently if you are over 59 years old.  Skin check.  Lung cancer screening. You may have this screening every year starting at age 84 if you have a 30-pack-year history of smoking and currently smoke or have quit  within the past 15 years.  Fecal occult blood test (FOBT) of the stool. You may have this test every year starting at age 7.  Flexible sigmoidoscopy or colonoscopy. You may have a sigmoidoscopy every 5 years or a colonoscopy every 10 years starting at age 88.  Hepatitis C blood test.  Hepatitis B blood test.  Sexually transmitted disease (STD) testing.  Diabetes screening. This is done by  checking your blood sugar (glucose) after you have not eaten for a while (fasting). You may have this done every 1-3 years.  Bone density scan. This is done to screen for osteoporosis. You may have this done starting at age 31.  Mammogram. This may be done every 1-2 years. Talk to your health care provider about how often you should have regular mammograms. Talk with your health care provider about your test results, treatment options, and if necessary, the need for more tests. Vaccines  Your health care provider may recommend certain vaccines, such as:  Influenza vaccine. This is recommended every year.  Tetanus, diphtheria, and acellular pertussis (Tdap, Td) vaccine. You may need a Td booster every 10 years.  Zoster vaccine. You may need this after age 84.  Pneumococcal 13-valent conjugate (PCV13) vaccine. One dose is recommended after age 68.  Pneumococcal polysaccharide (PPSV23) vaccine. One dose is recommended after age 35. Talk to your health care provider about which screenings and vaccines you need and how often you need them. This information is not intended to replace advice given to you by your health care provider. Make sure you discuss any questions you have with your health care provider. Document Released: 01/30/2015 Document Revised: 09/23/2015 Document Reviewed: 11/04/2014 Elsevier Interactive Patient Education  2017 [employer] Prevention in the Home Falls can cause injuries. They can happen to people of all ages. There are many things you can do to make your home safe and to help prevent falls. What can I do on the outside of my home?  Regularly fix the edges of walkways and driveways and fix any cracks.  Remove anything that might make you trip as you walk through a door, such as a raised step or threshold.  Trim any bushes or trees on the path to your home.  Use bright outdoor lighting.  Clear any walking paths of anything that might make someone trip,  such as rocks or tools.  Regularly check to see if handrails are loose or broken. Make sure that both sides of any steps have handrails.  Any raised decks and porches should have guardrails on the edges.  Have any leaves, snow, or ice cleared regularly.  Use sand or salt on walking paths during winter.  Clean up any spills in your garage right away. This includes oil or grease spills. What can I do in the bathroom?  Use night lights.  Install grab bars by the toilet and in the tub and shower. Do not use towel bars as grab bars.  Use non-skid mats or decals in the tub or shower.  If you need to sit down in the shower, use a plastic, non-slip stool.  Keep the floor dry. Clean up any water that spills on the floor as soon as it happens.  Remove soap buildup in the tub or shower regularly.  Attach bath mats securely with double-sided non-slip rug tape.  Do not have throw rugs and other things on the floor that can make you trip. What can I do in the bedroom?  Use  night lights.  Make sure that you have a light by your bed that is easy to reach.  Do not use any sheets or blankets that are too big for your bed. They should not hang down onto the floor.  Have a firm chair that has side arms. You can use this for support while you get dressed.  Do not have throw rugs and other things on the floor that can make you trip. What can I do in the kitchen?  Clean up any spills right away.  Avoid walking on wet floors.  Keep items that you use a lot in easy-to-reach places.  If you need to reach something above you, use a strong step stool that has a grab bar.  Keep electrical cords out of the way.  Do not use floor polish or wax that makes floors slippery. If you must use wax, use non-skid floor wax.  Do not have throw rugs and other things on the floor that can make you trip. What can I do with my stairs?  Do not leave any items on the stairs.  Make sure that there are  handrails on both sides of the stairs and use them. Fix handrails that are broken or loose. Make sure that handrails are as long as the stairways.  Check any carpeting to make sure that it is firmly attached to the stairs. Fix any carpet that is loose or worn.  Avoid having throw rugs at the top or bottom of the stairs. If you do have throw rugs, attach them to the floor with carpet tape.  Make sure that you have a light switch at the top of the stairs and the bottom of the stairs. If you do not have them, ask someone to add them for you. What else can I do to help prevent falls?  Wear shoes that:  Do not have high heels.  Have rubber bottoms.  Are comfortable and fit you well.  Are closed at the toe. Do not wear sandals.  If you use a stepladder:  Make sure that it is fully opened. Do not climb a closed stepladder.  Make sure that both sides of the stepladder are locked into place.  Ask someone to hold it for you, if possible.  Clearly mark and make sure that you can see:  Any grab bars or handrails.  First and last steps.  Where the edge of each step is.  Use tools that help you move around (mobility aids) if they are needed. These include:  Canes.  Walkers.  Scooters.  Crutches.  Turn on the lights when you go into a dark area. Replace any light bulbs as soon as they burn out.  Set up your furniture so you have a clear path. Avoid moving your furniture around.  If any of your floors are uneven, fix them.  If there are any pets around you, be aware of where they are.  Review your medicines with your doctor. Some medicines can make you feel dizzy. This can increase your chance of falling. Ask your doctor what other things that you can do to help prevent falls. This information is not intended to replace advice given to you by your health care provider. Make sure you discuss any questions you have with your health care provider. Document Released: 10/30/2008  Document Revised: 06/11/2015 Document Reviewed: 02/07/2014 Elsevier Interactive Patient Education  2017 [employer].

## 2019-08-28 MED ORDER — MELOXICAM 7.5 MG PO TABS: 7.5000 mg | ORAL_TABLET | Freq: Every day | ORAL | 1 refills | Status: DC

## 2019-08-29 ENCOUNTER — Telehealth: Payer: Self-pay | Admitting: Internal Medicine

## 2019-08-29 ENCOUNTER — Telehealth: Payer: Self-pay | Admitting: Family

## 2019-08-29 NOTE — Telephone Encounter (Signed)
Pt's GI doctor Dr. Chauncey Mann wants pt have Hep B shot. Please advise

## 2019-08-29 NOTE — Telephone Encounter (Signed)
sch new hep B vaccine x 2 doses 1 month apart

## 2019-08-29 NOTE — Telephone Encounter (Signed)
Morgan Hurst Scheduled both appointments.

## 2019-08-30 ENCOUNTER — Other Ambulatory Visit: Payer: Self-pay

## 2019-08-30 ENCOUNTER — Ambulatory Visit
Admission: RE | Admit: 2019-08-30 | Discharge: 2019-08-30 | Disposition: A | Discharge status: Home / Self Care | Payer: Medicare Other | Source: Ambulatory Visit | Attending: Gastroenterology | Admitting: Gastroenterology

## 2019-08-30 DIAGNOSIS — K760 Fatty (change of) liver, not elsewhere classified: Secondary | ICD-10-CM

## 2019-09-03 ENCOUNTER — Ambulatory Visit (INDEPENDENT_AMBULATORY_CARE_PROVIDER_SITE_OTHER): Payer: Medicare Other | Admitting: Family Medicine

## 2019-09-03 ENCOUNTER — Encounter (INDEPENDENT_AMBULATORY_CARE_PROVIDER_SITE_OTHER): Payer: Self-pay | Admitting: Family Medicine

## 2019-09-03 ENCOUNTER — Other Ambulatory Visit: Payer: Self-pay

## 2019-09-03 VITALS — BP 111/73 | HR 73 | Temp 98.1°F | Ht 61.0 in | Wt 203.0 lb

## 2019-09-03 DIAGNOSIS — E8881 Metabolic syndrome: Secondary | ICD-10-CM | POA: Diagnosis not present

## 2019-09-03 DIAGNOSIS — Z6838 Body mass index (BMI) 38.0-38.9, adult: Secondary | ICD-10-CM

## 2019-09-03 DIAGNOSIS — K760 Fatty (change of) liver, not elsewhere classified: Secondary | ICD-10-CM | POA: Diagnosis not present

## 2019-09-03 NOTE — Progress Notes (Signed)
Chief Complaint:   OBESITY Morgan Hurst is here to discuss her progress with her obesity treatment plan along with follow-up of her obesity related diagnoses. Morgan Hurst is on the Category 3 Plan and states she is following her eating plan approximately 80% of the time. Morgan Hurst states she is exercising for 0 minutes 0 times per week.  Today's visit was #: 4 Starting weight: 213 lbs Starting date: 06/20/2019 Today's weight: 203 lbs Today's date: 09/03/2019 Total lbs lost to date: 10 lbs Total lbs lost since last in-office visit: 3 lbs  Interim History: Morgan Hurst is down 30 pounds total today.  She is eating around 1000-2000 calories per day.  She says she is meeting her protein goals.  Subjective:   1. Hepatic steatosis Shown on elastography on 1/51/7616.  2. Metabolic syndrome Risk factors (3 or more constitute Metabolic Syndrome): blood pressure > 130/85 impaired fasting blood sugar on blood pressure medication.  Assessment/Plan:   1. Hepatic steatosis We reviewed the implications of this together. Treatment goal: 7-10% reduction of body weight.  Aerobic and resistance physical activity are important to help achieve a healthy body weight BUT also increases peripheral insulin sensitivity, reducing delivery of free fatty acids and glucose to the liver. Exercise also increases intrahepatic fatty acid oxidation, decreasing fatty acid synthesis, and helps prevent mitochondrial and hepatocellular damage. Medications that can help reduce hepatic fat include metformin and GLP1RAs.   2. Metabolic syndrome Goal: Lose 7-10% of starting weight. Counseling: Intensive lifestyle modifications are the first line treatment for this issue. We discussed several lifestyle modifications today and she will continue to work on diet, exercise and weight loss efforts.   3. Class 2 severe obesity with serious comorbidity and body mass index (BMI) of 38.0 to 38.9 in adult, unspecified obesity type (HCC) Morgan Hurst is currently  in the action stage of change. As such, her goal is to continue with weight loss efforts. She has agreed to keeping a food journal and adhering to recommended goals of 1200 calories and 95 grams of protein.   Exercise goals: Older adults should follow the adult guidelines. When older adults cannot meet the adult guidelines, they should be as physically active as their abilities and conditions will allow.  Older adults should do exercises that maintain or improve balance if they are at risk of falling.   Behavioral modification strategies: increasing lean protein intake.  Morgan Hurst has agreed to follow-up with our clinic in 2-3 weeks. She was informed of the importance of frequent follow-up visits to maximize her success with intensive lifestyle modifications for her multiple health conditions.   Objective:   Blood pressure 111/73, pulse 73, temperature 98.1 F (36.7 C), temperature source Oral, height 5' 1"  (1.549 m), weight 203 lb (92.1 kg), SpO2 96 %. Body mass index is 38.36 kg/m.  General: Cooperative, alert, well developed, in no acute distress. HEENT: Conjunctivae and lids unremarkable. Cardiovascular: Regular rhythm.  Lungs: Normal work of breathing. Neurologic: No focal deficits.   Lab Results  Component Value Date   CREATININE 0.62 06/20/2019   BUN 17 06/20/2019   NA 144 06/20/2019   K 3.8 06/20/2019   CL 102 06/20/2019   CO2 24 06/20/2019   Lab Results  Component Value Date   ALT 28 06/20/2019   AST 34 06/20/2019   ALKPHOS 81 06/20/2019   BILITOT 0.5 06/20/2019   Lab Results  Component Value Date   HGBA1C 5.9 05/15/2019   HGBA1C 6.0 09/04/2018   HGBA1C 5.9 12/25/2017  HGBA1C 6.0 04/11/2017   HGBA1C 5.7 02/22/2016   Lab Results  Component Value Date   INSULIN 20.9 06/20/2019   Lab Results  Component Value Date   TSH 3.060 06/20/2019   Lab Results  Component Value Date   CHOL 191 05/15/2019   HDL 45.60 05/15/2019   LDLCALC 118 (H) 05/15/2019   LDLDIRECT  177.7 12/25/2012   TRIG 137.0 05/15/2019   CHOLHDL 4 05/15/2019   Lab Results  Component Value Date   WBC 7.8 06/20/2019   HGB 14.9 06/20/2019   HCT 44.5 06/20/2019   MCV 89 06/20/2019   PLT 189 06/20/2019   Lab Results  Component Value Date   IRON 59 09/07/2018   FERRITIN 87.1 09/07/2018   Attestation Statements:   Reviewed by clinician on day of visit: allergies, medications, problem list, medical history, surgical history, family history, social history, and previous encounter notes.  Time spent on visit including pre-visit chart review and post-visit care and charting was 25 minutes.   I, Water quality scientist, CMA, am acting as transcriptionist for Briscoe Deutscher, DO  I have reviewed the above documentation for accuracy and completeness, and I agree with the above. Briscoe Deutscher, DO

## 2019-09-05 ENCOUNTER — Other Ambulatory Visit: Payer: Self-pay

## 2019-09-05 ENCOUNTER — Ambulatory Visit (INDEPENDENT_AMBULATORY_CARE_PROVIDER_SITE_OTHER): Payer: Medicare Other

## 2019-09-05 DIAGNOSIS — Z23 Encounter for immunization: Secondary | ICD-10-CM | POA: Diagnosis not present

## 2019-09-05 NOTE — Progress Notes (Signed)
Patient presented for Hep B injection to left, patient voiced no concerns nor showed any signs of distress during injection.

## 2019-09-16 ENCOUNTER — Other Ambulatory Visit: Payer: Self-pay

## 2019-09-16 ENCOUNTER — Ambulatory Visit (INDEPENDENT_AMBULATORY_CARE_PROVIDER_SITE_OTHER): Payer: Medicare Other | Admitting: Family Medicine

## 2019-09-16 ENCOUNTER — Encounter (INDEPENDENT_AMBULATORY_CARE_PROVIDER_SITE_OTHER): Payer: Self-pay | Admitting: Family Medicine

## 2019-09-16 VITALS — BP 116/73 | HR 71 | Temp 98.0°F | Ht 61.0 in | Wt 201.0 lb

## 2019-09-16 DIAGNOSIS — E66812 Obesity, class 2: Secondary | ICD-10-CM

## 2019-09-16 DIAGNOSIS — E8881 Metabolic syndrome: Secondary | ICD-10-CM

## 2019-09-16 DIAGNOSIS — Z6838 Body mass index (BMI) 38.0-38.9, adult: Secondary | ICD-10-CM | POA: Diagnosis not present

## 2019-09-16 DIAGNOSIS — R7303 Prediabetes: Secondary | ICD-10-CM

## 2019-09-16 DIAGNOSIS — K760 Fatty (change of) liver, not elsewhere classified: Secondary | ICD-10-CM

## 2019-09-16 MED ORDER — TRULICITY 1.5 MG/0.5ML ~~LOC~~ SOAJ: SUBCUTANEOUS | 0 refills | Status: DC

## 2019-09-16 NOTE — Progress Notes (Signed)
Chief Complaint:   OBESITY Morgan Hurst is here to discuss her progress with her obesity treatment plan along with follow-up of her obesity related diagnoses. Morgan Hurst is on the Category 3 Plan and states she is following her eating plan approximately 80-90% of the time. Morgan Hurst states she is walking for 20 minutes 2 times per week.  Today's visit was #: 5 Starting weight: 213 lbs Starting date: 06/20/2019 Today's weight: 201 lbs Today's date: 09/16/2019 Total lbs lost to date: 12 lbs Total lbs lost since last in-office visit: 2 lbs  Interim History: Morgan Hurst says she has increased her walking.  She admits to more "nibbling" when she does not log food.  She has lost 14% of her body weight since starting her weight loss journey.  Reviewing food choices: She is not getting enough protein.  Assessment/Plan:   1. Metabolic syndrome Goal: Lose 7-10% of starting weight. Counseling: Intensive lifestyle modifications are the first line treatment for this issue. We discussed several lifestyle modifications today and she will continue to work on diet, exercise and weight loss efforts.   - Dulaglutide (TRULICITY) 1.5 ZY/6.0YT SOPN; ADMINISTER 1.5 MG UNDER THE SKIN 1 TIME A WEEK  Dispense: 2 mL; Refill: 0  2. Prediabetes Morgan Hurst will continue to work on weight loss, exercise, and decreasing simple carbohydrates to help decrease the risk of diabetes.   3. Hepatic steatosis Treatment goal: 7-10% reduction of body weight.  Aerobic and resistance physical activity are important to help achieve a healthy body weight BUT also increases peripheral insulin sensitivity, reducing delivery of free fatty acids and glucose to the liver. Exercise also increases intrahepatic fatty acid oxidation, decreasing fatty acid synthesis, and helps prevent mitochondrial and hepatocellular damage. Medications that can help reduce hepatic fat include metformin and GLP1RAs.   4. Class 2 severe obesity with serious comorbidity and body mass  index (BMI) of 38.0 to 38.9 in adult, unspecified obesity type (HCC) Morgan Hurst is currently in the action stage of change. As such, her goal is to continue with weight loss efforts. She has agreed to keeping a food journal and adhering to recommended goals of 1500 calories and 65+ grams of protein at the lowest, with a goal of 95+ grams of protein daily.  Exercise goals: Older adults should follow the adult guidelines. When older adults cannot meet the adult guidelines, they should be as physically active as their abilities and conditions will allow.  Older adults should do exercises that maintain or improve balance if they are at risk of falling.   Behavioral modification strategies: increasing lean protein intake and increasing water intake.  Morgan Hurst has agreed to follow-up with our clinic in 3 weeks. She was informed of the importance of frequent follow-up visits to maximize her success with intensive lifestyle modifications for her multiple health conditions.   Objective:   Blood pressure 116/73, pulse 71, temperature 98 F (36.7 C), temperature source Oral, height 5' 1"  (1.549 m), weight 201 lb (91.2 kg), SpO2 94 %. Body mass index is 37.98 kg/m.  General: Cooperative, alert, well developed, in no acute distress. HEENT: Conjunctivae and lids unremarkable. Cardiovascular: Regular rhythm.  Lungs: Normal work of breathing. Neurologic: No focal deficits.   Lab Results  Component Value Date   CREATININE 0.62 06/20/2019   BUN 17 06/20/2019   NA 144 06/20/2019   K 3.8 06/20/2019   CL 102 06/20/2019   CO2 24 06/20/2019   Lab Results  Component Value Date   ALT 28 06/20/2019  AST 34 06/20/2019   ALKPHOS 81 06/20/2019   BILITOT 0.5 06/20/2019   Lab Results  Component Value Date   HGBA1C 5.9 05/15/2019   HGBA1C 6.0 09/04/2018   HGBA1C 5.9 12/25/2017   HGBA1C 6.0 04/11/2017   HGBA1C 5.7 02/22/2016   Lab Results  Component Value Date   INSULIN 20.9 06/20/2019   Lab Results    Component Value Date   TSH 3.060 06/20/2019   Lab Results  Component Value Date   CHOL 191 05/15/2019   HDL 45.60 05/15/2019   LDLCALC 118 (H) 05/15/2019   LDLDIRECT 177.7 12/25/2012   TRIG 137.0 05/15/2019   CHOLHDL 4 05/15/2019   Lab Results  Component Value Date   WBC 7.8 06/20/2019   HGB 14.9 06/20/2019   HCT 44.5 06/20/2019   MCV 89 06/20/2019   PLT 189 06/20/2019   Lab Results  Component Value Date   IRON 59 09/07/2018   FERRITIN 87.1 09/07/2018   Obesity Behavioral Intervention:   Approximately 15 minutes were spent on the discussion below.  ASK: We discussed the diagnosis of obesity with Morgan Hurst today and Morgan Hurst agreed to give [place] permission to discuss obesity behavioral modification therapy today.  ASSESS: Morgan Hurst has the diagnosis of obesity and her BMI today is 38.0. Morgan Hurst is in the action stage of change.   ADVISE: Morgan Hurst was educated on the multiple health risks of obesity as well as the benefit of weight loss to improve her health. She was advised of the need for long term treatment and the importance of lifestyle modifications to improve her current health and to decrease her risk of future health problems.  AGREE: Multiple dietary modification options and treatment options were discussed and Morgan Hurst agreed to follow the recommendations documented in the above note.  ARRANGE: Morgan Hurst was educated on the importance of frequent visits to treat obesity as outlined per CMS and USPSTF guidelines and agreed to schedule her next follow up appointment today.  Attestation Statements:   Reviewed by clinician on day of visit: allergies, medications, problem list, medical history, surgical history, family history, social history, and previous encounter notes.  I, Water quality scientist, CMA, am acting as transcriptionist for Briscoe Deutscher, DO  I have reviewed the above documentation for accuracy and completeness, and I agree with the above. Briscoe Deutscher, DO

## 2019-09-25 ENCOUNTER — Ambulatory Visit: Payer: Medicare Other

## 2019-09-25 ENCOUNTER — Other Ambulatory Visit: Payer: Self-pay

## 2019-09-25 ENCOUNTER — Ambulatory Visit (INDEPENDENT_AMBULATORY_CARE_PROVIDER_SITE_OTHER): Payer: Medicare Other | Admitting: Family

## 2019-09-25 ENCOUNTER — Encounter: Payer: Self-pay | Admitting: Family

## 2019-09-25 VITALS — BP 110/64 | HR 72 | Temp 97.6°F | Ht 61.0 in | Wt 208.2 lb

## 2019-09-25 DIAGNOSIS — R7303 Prediabetes: Secondary | ICD-10-CM

## 2019-09-25 DIAGNOSIS — R0600 Dyspnea, unspecified: Secondary | ICD-10-CM

## 2019-09-25 DIAGNOSIS — M48062 Spinal stenosis, lumbar region with neurogenic claudication: Secondary | ICD-10-CM | POA: Diagnosis not present

## 2019-09-25 DIAGNOSIS — K760 Fatty (change of) liver, not elsewhere classified: Secondary | ICD-10-CM | POA: Diagnosis not present

## 2019-09-25 DIAGNOSIS — Z23 Encounter for immunization: Secondary | ICD-10-CM

## 2019-09-25 LAB — POCT GLYCOSYLATED HEMOGLOBIN (HGB A1C): Hemoglobin A1C: 5.2 % (ref 4.0–5.6)

## 2019-09-25 NOTE — Progress Notes (Signed)
Subjective:    Patient ID: Morgan Hurst, female    DOB: May 17, 1945, 74 y.o.   MRN: [id number]  CC: Morgan Hurst is a 74 y.o. female who presents today for follow up.   HPI: Overall feels well today Feels that she 'has made progress.' No new concerns.   dysnea has improved with weight loss of 20 lbs and use of prn metolazone.  Walking on treadmill most days. Feels steady and no falls. Able to walk up to 20 minutes now.   Recently started trulicity with Dr [name]   Leg cramps are very well controlled. Rare leg pain. Uses requip once every 2 weeks.   Uses tramadol for low back pain with relief. Able to walk without pain. Uses discretion with use and more often uses every other day.    Following [facility], 20 lbs of weight loss.  08/2019 [name] , GI NAFLD - [place] RUQ repeated 08/30/19 08/2019 Dr [name]- stopped gabapentin, discussed repeat CT chest . Using metolazone 58m PRN, no OSA.   a1c 5.9  Lumbar XR 2020 showed arthritic changes   HISTORY:  Past Medical History:  Diagnosis Date  . Arthritis    lower back, right hip  . B12 deficiency   . Back pain   . Borderline diabetes    PCP STARTED PT ON METFORMIN IN 2016 DUE TO ELEVATED GLUCOSE   . Complication of anesthesia    FIGHTING WHEN WAKING UP FROM ANESTHESIA  . Constipation   . Diabetes mellitus without complication ([facility]   . Endometriosis   . Fatty liver   . Gallbladder problem   . GERD (gastroesophageal reflux disease)   . Heart murmur   . High cholesterol   . Hip pain   . Joint pain   . Kidney stones   . Leg weakness   . Orthodontics    top front 3 teeth caps and bridge  . Prediabetes   . RA (rheumatoid arthritis) ([facility]   . SOB (shortness of breath)   . Stomach ulcer   . Swelling   . Vitamin D deficiency    Past Surgical History:  Procedure Laterality Date  . ABDOMINAL HYSTERECTOMY  age 74 . ANKLE SURGERY    . CHOLECYSTECTOMY N/A 04/10/2015   Procedure: LAPAROSCOPIC  CHOLECYSTECTOMY WITH INTRAOPERATIVE CHOLANGIOGRAM;  Surgeon: JRobert Bellow MD;  Location: [facility] OR[facility];  Service: General;  Laterality: N/A;  . COLONOSCOPY WITH PROPOFOL N/A 07/10/2017   Procedure: COLONOSCOPY WITH PROPOFOL;  Surgeon: AJonathon Bellows MD;  [facility];  Service: Gastroenterology;  Laterality: N/A;  . ESOPHAGOGASTRODUODENOSCOPY N/A 05/12/2015   Procedure: ESOPHAGOGASTRODUODENOSCOPY (EGD);  Surgeon: PHulen Luster MD;  [place] [facility]  Service: Gastroenterology;  Laterality: N/A;  . VAGINAL DELIVERY  2   Family History  Problem Relation Age of Onset  . Diabetes Mother   . Cancer Mother        colon and breast  . Breast cancer Mother   . Liver disease Mother   . Cancer Father        Leukemia  . Cancer Brother        colon and lung  . Cholelithiasis Daughter   . Heart disease Paternal Grandfather   . Cancer Other 29       colon    Allergies: Patient has no known allergies. Current Outpatient Medications on File Prior to Visit  Medication Sig Dispense Refill  . aspirin 81 MG tablet Take 81 mg by  mouth daily.    . calcium carbonate (OSCAL) 1500 (600 Ca) MG TABS tablet Take 600 mg by mouth 2 (two) times daily with a meal.    . cholecalciferol (VITAMIN D3) 25 MCG (1000 UNIT) tablet Take 2,000 Units by mouth daily.    . Citicoline (COGNITIVE HEALTH PO) Take 1 tablet by mouth in the morning and at bedtime.    . colesevelam (WELCHOL) 625 MG tablet Take 625 mg by mouth daily.    . cyanocobalamin (,VITAMIN B-12,) 1000 MCG/ML injection INJECT 1 ML ONCE PER MONTH 1 mL 15  . Dulaglutide (TRULICITY) 1.5 PQ/3.3AQ SOPN ADMINISTER 1.5 MG UNDER THE SKIN 1 TIME A WEEK 2 mL 0  . esomeprazole (NEXIUM) 40 MG capsule Take by mouth.    . meloxicam (MOBIC) 7.5 MG tablet Take 1 tablet (7.5 mg total) by mouth daily. 30 tablet 1  . metolazone (ZAROXOLYN) 5 MG tablet Take 5 mg by mouth daily.    . ondansetron (ZOFRAN) 4 MG tablet Take 1 tablet (4 mg total) by mouth every 8  (eight) hours as needed for nausea or vomiting. 20 tablet 0  . rOPINIRole (REQUIP) 0.25 MG tablet TAKE 1 TABLET(0.25 MG) BY MOUTH AT BEDTIME AS NEEDED 90 tablet 0  . traMADol (ULTRAM) 50 MG tablet TAKE 1 TABLET BY MOUTH DAILY AS NEEDED 30 tablet 1   No current facility-administered medications on file prior to visit.    Social History   Tobacco Use  . Smoking status: Never Smoker  . Smokeless tobacco: Never Used  Vaping Use  . Vaping Use: Never used  Substance Use Topics  . Alcohol use: No  . Drug use: No    Review of Systems  Constitutional: Negative for chills and fever.  Respiratory: Positive for shortness of breath (improved). Negative for cough.   Cardiovascular: Negative for chest pain and palpitations.  Gastrointestinal: Negative for nausea and vomiting.  Musculoskeletal: Positive for back pain (chronic).      Objective:    BP 110/64   Pulse 72   Temp 97.6 F (36.4 C)   Ht 5' 1"  (1.549 m)   Wt 208 lb 3.2 oz (94.4 kg)   SpO2 98%   BMI 39.34 kg/m  BP Readings from Last 3 Encounters:  09/25/19 110/64  09/16/19 116/73  09/03/19 111/73   Wt Readings from Last 3 Encounters:  09/25/19 208 lb 3.2 oz (94.4 kg)  09/16/19 201 lb (91.2 kg)  09/03/19 203 lb (92.1 kg)    Physical Exam Vitals reviewed.  Constitutional:      Appearance: She is well-developed.  Eyes:     Conjunctiva/sclera: Conjunctivae normal.  Cardiovascular:     Rate and Rhythm: Normal rate and regular rhythm.     Pulses: Normal pulses.     Heart sounds: Normal heart sounds.  Pulmonary:     Effort: Pulmonary effort is normal.     Breath sounds: Normal breath sounds. No wheezing, rhonchi or rales.  Skin:    General: Skin is warm and dry.  Neurological:     Mental Status: She is alert.  Psychiatric:        Speech: Speech normal.        Behavior: Behavior normal.        Thought Content: Thought content normal.        Assessment & Plan:   Problem List Items Addressed This Visit       Digestive   Fatty liver disease, nonalcoholic    Established with [facility],  [employer]. Recent [place] RUQ. No overt symptoms to suggest progression. Congratulated her on weight loss as it relates to thwarting progression of NAFLD.  Will follow.         Other   Dyspnea    Continues to improve, in particular with weight loss and use of metolazone prn. She has f/u with Dr [name], will follow.       Prediabetes - Primary    No longer prediabetic. Suspect from weight loss, trulicity. Will follow      Relevant Orders   POCT HgB A1C (Completed)   Heplisav-B (HepB-CPG) Vaccine (Completed)   Spinal stenosis, lumbar region, with neurogenic claudication    Stable, continue tramadol prn. Follow up every 3 months.        Other Visit Diagnoses    Need for hepatitis B vaccination           I have discontinued Shari M. Dutko's metFORMIN and traZODone. I am also having her maintain her aspirin, esomeprazole, colesevelam, ondansetron, rOPINIRole, cholecalciferol, calcium carbonate, cyanocobalamin, metolazone, Citicoline (COGNITIVE HEALTH PO), traMADol, meloxicam, and Trulicity.   No orders of the defined types were placed in this encounter.   Return precautions given.   Risks, benefits, and alternatives of the medications and treatment plan prescribed today were discussed, and patient expressed understanding.   Education regarding symptom management and diagnosis given to patient on AVS.  Continue to follow with Burnard Hawthorne, FNP for routine health maintenance.   [name] and I agreed with plan.   Mable Paris, FNP

## 2019-09-25 NOTE — Assessment & Plan Note (Addendum)
Stable, continue tramadol prn. Follow up every 3 months.

## 2019-09-25 NOTE — Patient Instructions (Signed)
You are doing great!! So pleased with your weight loss

## 2019-09-25 NOTE — Assessment & Plan Note (Signed)
Continues to improve, in particular with weight loss and use of metolazone prn. She has f/u with Dr [name], will follow.

## 2019-09-25 NOTE — Assessment & Plan Note (Signed)
No longer prediabetic. Suspect from weight loss, trulicity. Will follow

## 2019-09-25 NOTE — Assessment & Plan Note (Signed)
Established with Arbie Cookey, [name]. Recent US RUQ. No overt symptoms to suggest progression. Congratulated her on weight loss as it relates to thwarting progression of NAFLD.  Will follow.

## 2019-10-07 ENCOUNTER — Other Ambulatory Visit: Payer: Self-pay | Admitting: Family

## 2019-10-07 DIAGNOSIS — R252 Cramp and spasm: Secondary | ICD-10-CM

## 2019-10-08 ENCOUNTER — Ambulatory Visit (INDEPENDENT_AMBULATORY_CARE_PROVIDER_SITE_OTHER): Payer: Medicare Other | Admitting: Family Medicine

## 2019-10-08 ENCOUNTER — Other Ambulatory Visit: Payer: Self-pay

## 2019-10-08 ENCOUNTER — Encounter (INDEPENDENT_AMBULATORY_CARE_PROVIDER_SITE_OTHER): Payer: Self-pay | Admitting: Family Medicine

## 2019-10-08 VITALS — BP 114/72 | HR 76 | Temp 97.7°F | Ht 61.0 in | Wt 198.0 lb

## 2019-10-08 DIAGNOSIS — E8881 Metabolic syndrome: Secondary | ICD-10-CM | POA: Diagnosis not present

## 2019-10-08 DIAGNOSIS — R7303 Prediabetes: Secondary | ICD-10-CM | POA: Diagnosis not present

## 2019-10-08 DIAGNOSIS — K760 Fatty (change of) liver, not elsewhere classified: Secondary | ICD-10-CM

## 2019-10-08 DIAGNOSIS — Z6837 Body mass index (BMI) 37.0-37.9, adult: Secondary | ICD-10-CM | POA: Diagnosis not present

## 2019-10-09 NOTE — Progress Notes (Signed)
Chief Complaint:   OBESITY Musette is here to discuss her progress with her obesity treatment plan along with follow-up of her obesity related diagnoses. Katlyn is on keeping a food journal and adhering to recommended goals of 1500 calories and 65+ grams of protein and states she is following her eating plan approximately 80-90% of the time. Tyiesha states she is exercising for 0 minutes 0 times per week.  Today's visit was #: 6 Starting weight: 213 lbs Starting date: 06/20/2019 Today's weight: 198 lbs Today's date: 10/08/2019 Total lbs lost to date: 15 lbs Total lbs lost since last in-office visit: 3 lbs Total weight loss percentage to date: -7.04%  Interim History: Yanci says she has been having sweets cravings all day.  She will usually portion out something sweet.  She saw her PCP, and her A1c is down to 5.2.  Assessment/Plan:   1. Prediabetes At goal. Goal is HgbA1c < 5.7 and insulin level closer to 5.  Continue Trulicity.  Lab Results  Component Value Date   HGBA1C 5.2 09/25/2019   Lab Results  Component Value Date   INSULIN 20.9 06/20/2019   2. NAFLD (nonalcoholic fatty liver disease) Ayeza met the 7% weight loss goal. Treatment goal: 7-10% reduction of body weight.  Aerobic and resistance physical activity are important to help achieve a healthy body weight BUT also increases peripheral insulin sensitivity, reducing delivery of free fatty acids and glucose to the liver. Exercise also increases intrahepatic fatty acid oxidation, decreasing fatty acid synthesis, and helps prevent mitochondrial and hepatocellular damage. Medications that can help reduce hepatic fat include metformin and GLP1RAs. Plan: Continue Trulicity.  3. Metabolic syndrome Keilynn met the 7% weight loss goal. Goal: Lose 7-10% of starting weight. Elevated visceral fat at 16. She will continue to focus on protein-rich, low simple carbohydrate foods. We reviewed the importance of hydration, regular exercise for  stress reduction, and restorative sleep.   4. Class 2 severe obesity with serious comorbidity and body mass index (BMI) of 37.0 to 37.9 in adult, unspecified obesity type (HCC) Iraida is currently in the action stage of change. As such, her goal is to continue with weight loss efforts. She has agreed to keeping a food journal and adhering to recommended goals of 1000 calories and >60 grams of protein.   Exercise goals: Older adults should follow the adult guidelines. When older adults cannot meet the adult guidelines, they should be as physically active as their abilities and conditions will allow.  Older adults should do exercises that maintain or improve balance if they are at risk of falling.   Behavioral modification strategies: increasing lean protein intake, decreasing simple carbohydrates, increasing vegetables, increasing water intake and better snacking choices.  Youa has agreed to follow-up with our clinic in 2-3 weeks. She was informed of the importance of frequent follow-up visits to maximize her success with intensive lifestyle modifications for her multiple health conditions.   Objective:   Blood pressure 114/72, pulse 76, temperature 97.7 F (36.5 C), temperature source Oral, height _0  (1.549 m), weight 198 lb (89.8 kg), SpO2 96 %. Body mass index is 37.41 kg/m.  General: Cooperative, alert, well developed, in no acute distress. HEENT: Conjunctivae and lids unremarkable. Cardiovascular: Regular rhythm.  Lungs: Normal work of breathing. Neurologic: No focal deficits.   Lab Results  Component Value Date   CREATININE 0.62 06/20/2019   BUN 17 06/20/2019   NA 144 06/20/2019   K 3.8 06/20/2019   CL 102 06/20/2019  CO2 24 06/20/2019   Lab Results  Component Value Date   ALT 28 06/20/2019   AST 34 06/20/2019   ALKPHOS 81 06/20/2019   BILITOT 0.5 06/20/2019   Lab Results  Component Value Date   HGBA1C 5.2 09/25/2019   HGBA1C 5.9 05/15/2019   HGBA1C 6.0 09/04/2018    HGBA1C 5.9 12/25/2017   HGBA1C 6.0 04/11/2017   Lab Results  Component Value Date   INSULIN 20.9 06/20/2019   Lab Results  Component Value Date   TSH 3.060 06/20/2019   Lab Results  Component Value Date   CHOL 191 05/15/2019   HDL 45.60 05/15/2019   LDLCALC 118 (H) 05/15/2019   LDLDIRECT 177.7 12/25/2012   TRIG 137.0 05/15/2019   CHOLHDL 4 05/15/2019   Lab Results  Component Value Date   WBC 7.8 06/20/2019   HGB 14.9 06/20/2019   HCT 44.5 06/20/2019   MCV 89 06/20/2019   PLT 189 06/20/2019   Lab Results  Component Value Date   IRON 59 09/07/2018   FERRITIN 87.1 09/07/2018   Obesity Behavioral Intervention:   Approximately 15 minutes were spent on the discussion below.  ASK: We discussed the diagnosis of obesity with [name] today and Yenifer agreed to give [place] permission to discuss obesity behavioral modification therapy today.  ASSESS: Lyndy has the diagnosis of obesity and her BMI today is 37.5. Milo is in the action stage of change.   ADVISE: Dianara was educated on the multiple health risks of obesity as well as the benefit of weight loss to improve her health. She was advised of the need for long term treatment and the importance of lifestyle modifications to improve her current health and to decrease her risk of future health problems.  AGREE: Multiple dietary modification options and treatment options were discussed and Jenavieve agreed to follow the recommendations documented in the above note.  ARRANGE: Rawan was educated on the importance of frequent visits to treat obesity as outlined per CMS and USPSTF guidelines and agreed to schedule her next follow up appointment today.  Attestation Statements:   Reviewed by clinician on day of visit: allergies, medications, problem list, medical history, surgical history, family history, social history, and previous encounter notes.  I, Water quality scientist, CMA, am acting as transcriptionist for Briscoe Deutscher, DO  I have  reviewed the above documentation for accuracy and completeness, and I agree with the above. Briscoe Deutscher, DO

## 2019-10-17 DIAGNOSIS — J069 Acute upper respiratory infection, unspecified: Secondary | ICD-10-CM | POA: Diagnosis not present

## 2019-10-22 ENCOUNTER — Ambulatory Visit (INDEPENDENT_AMBULATORY_CARE_PROVIDER_SITE_OTHER): Payer: Medicare Other | Admitting: Family Medicine

## 2019-10-22 ENCOUNTER — Other Ambulatory Visit: Payer: Self-pay

## 2019-10-22 ENCOUNTER — Encounter (INDEPENDENT_AMBULATORY_CARE_PROVIDER_SITE_OTHER): Payer: Self-pay | Admitting: Family Medicine

## 2019-10-22 VITALS — BP 109/70 | HR 69 | Temp 97.7°F | Ht 61.0 in | Wt 200.0 lb

## 2019-10-22 DIAGNOSIS — E8881 Metabolic syndrome: Secondary | ICD-10-CM | POA: Diagnosis not present

## 2019-10-22 DIAGNOSIS — K760 Fatty (change of) liver, not elsewhere classified: Secondary | ICD-10-CM

## 2019-10-22 DIAGNOSIS — R7303 Prediabetes: Secondary | ICD-10-CM

## 2019-10-22 MED ORDER — TRULICITY 3 MG/0.5ML ~~LOC~~ SOAJ: 3.0000 mg | SUBCUTANEOUS | 0 refills | Status: DC

## 2019-10-23 NOTE — Progress Notes (Signed)
Chief Complaint:   OBESITY Morgan Hurst is here to discuss her progress with her obesity treatment plan along with follow-up of her obesity related diagnoses. Morgan Hurst is on keeping a food journal and adhering to recommended goals of 1200 calories and 50 grams of protein and states she is following her eating plan approximately 60% of the time. Morgan Hurst states she is exercising for 0 minutes 0 times per week.  Today's visit was #: 7 Starting weight: 213 lbs Starting date: 06/20/2019 Today's weight: 200 lbs Today's date: 10/22/2019 Total lbs lost to date: 13 lbs Total lbs lost since last in-office visit: 0 Total weight loss percentage to date: -6.10%  Interim History: Morgan Hurst says she has had "increased hunger for the foods I want to eat - sugar, carbs".  Assessment/Plan:   1. Prediabetes Not optimized. Goal is HgbA1c < 5.7 and insulin level closer to 5. She will continue to focus on protein-rich, low simple carbohydrate foods. We reviewed the importance of hydration, regular exercise for stress reduction, and restorative sleep.   Lab Results  Component Value Date   HGBA1C 5.2 09/25/2019   Lab Results  Component Value Date   INSULIN 20.9 06/20/2019   -Refill Dulaglutide (TRULICITY) 3 MW/4.1LK SOPN; Inject 3 mg as directed once a week.  Dispense: 2 mL; Refill: 0  2. Metabolic syndrome Improving. Goal: Lose 7-10% of starting weight. We discussed several lifestyle modifications today and she will continue to work on diet, exercise and weight loss efforts.   3. NAFLD (nonalcoholic fatty liver disease) Treatment goal: 7-10% reduction of body weight.  Aerobic and resistance physical activity are important to help achieve a healthy body weight BUT also increases peripheral insulin sensitivity, reducing delivery of free fatty acids and glucose to the liver. Exercise also increases intrahepatic fatty acid oxidation, decreasing fatty acid synthesis, and helps prevent mitochondrial and hepatocellular  damage. Medications that can help reduce hepatic fat include metformin and GLP1RAs.  Plan:  Continue Trulicity.  4. Class 2 severe obesity with serious comorbidity and body mass index (BMI) of 37.0 to 37.9 in adult, unspecified obesity type (HCC)  Morgan Hurst is currently in the action stage of change. As such, her goal is to continue with weight loss efforts. She has agreed to keeping a food journal and adhering to recommended goals of 1200 calories and >50 grams of protein.   Exercise goals: Older adults should follow the adult guidelines. When older adults cannot meet the adult guidelines, they should be as physically active as their abilities and conditions will allow.  Older adults should do exercises that maintain or improve balance if they are at risk of falling.   Behavioral modification strategies: increasing lean protein intake, decreasing simple carbohydrates, increasing vegetables, increasing water intake, decreasing sodium intake and increasing high fiber foods.  Morgan Hurst has agreed to follow-up with our clinic in 3 weeks. She was informed of the importance of frequent follow-up visits to maximize her success with intensive lifestyle modifications for her multiple health conditions.   Objective:   Blood pressure 109/70, pulse 69, temperature 97.7 F (36.5 C), temperature source Oral, height 5' 1"  (1.549 m), weight 200 lb (90.7 kg), SpO2 97 %. Body mass index is 37.79 kg/m.  General: Cooperative, alert, well developed, in no acute distress. HEENT: Conjunctivae and lids unremarkable. Cardiovascular: Regular rhythm.  Lungs: Normal work of breathing. Neurologic: No focal deficits.   Lab Results  Component Value Date   CREATININE 0.62 06/20/2019   BUN 17 06/20/2019  NA 144 06/20/2019   K 3.8 06/20/2019   CL 102 06/20/2019   CO2 24 06/20/2019   Lab Results  Component Value Date   ALT 28 06/20/2019   AST 34 06/20/2019   ALKPHOS 81 06/20/2019   BILITOT 0.5 06/20/2019   Lab  Results  Component Value Date   HGBA1C 5.2 09/25/2019   HGBA1C 5.9 05/15/2019   HGBA1C 6.0 09/04/2018   HGBA1C 5.9 12/25/2017   HGBA1C 6.0 04/11/2017   Lab Results  Component Value Date   INSULIN 20.9 06/20/2019   Lab Results  Component Value Date   TSH 3.060 06/20/2019   Lab Results  Component Value Date   CHOL 191 05/15/2019   HDL 45.60 05/15/2019   LDLCALC 118 (H) 05/15/2019   LDLDIRECT 177.7 12/25/2012   TRIG 137.0 05/15/2019   CHOLHDL 4 05/15/2019   Lab Results  Component Value Date   WBC 7.8 06/20/2019   HGB 14.9 06/20/2019   HCT 44.5 06/20/2019   MCV 89 06/20/2019   PLT 189 06/20/2019   Lab Results  Component Value Date   IRON 59 09/07/2018   FERRITIN 87.1 09/07/2018   Obesity Behavioral Intervention:   Approximately 15 minutes were spent on the discussion below.  ASK: We discussed the diagnosis of obesity with Morgan Hurst today and Morgan Hurst agreed to give [place] permission to discuss obesity behavioral modification therapy today.  ASSESS: Morgan Hurst has the diagnosis of obesity and her BMI today is 37.8. Morgan Hurst is in the action stage of change.   ADVISE: Morgan Hurst was educated on the multiple health risks of obesity as well as the benefit of weight loss to improve her health. She was advised of the need for long term treatment and the importance of lifestyle modifications to improve her current health and to decrease her risk of future health problems.  AGREE: Multiple dietary modification options and treatment options were discussed and Morgan Hurst agreed to follow the recommendations documented in the above note.  ARRANGE: Morgan Hurst was educated on the importance of frequent visits to treat obesity as outlined per CMS and USPSTF guidelines and agreed to schedule her next follow up appointment today.  Attestation Statements:   Reviewed by clinician on day of visit: allergies, medications, problem list, medical history, surgical history, family history, social history, and previous  encounter notes.  I, Water quality scientist, CMA, am acting as transcriptionist for Briscoe Deutscher, DO  I have reviewed the above documentation for accuracy and completeness, and I agree with the above. Briscoe Deutscher, DO

## 2019-10-29 ENCOUNTER — Other Ambulatory Visit (INDEPENDENT_AMBULATORY_CARE_PROVIDER_SITE_OTHER): Payer: Self-pay | Admitting: Family Medicine

## 2019-10-29 DIAGNOSIS — E8881 Metabolic syndrome: Secondary | ICD-10-CM

## 2019-10-31 ENCOUNTER — Other Ambulatory Visit: Payer: Self-pay | Admitting: Family

## 2019-10-31 DIAGNOSIS — Z760 Encounter for issue of repeat prescription: Secondary | ICD-10-CM

## 2019-11-13 ENCOUNTER — Other Ambulatory Visit: Payer: Self-pay

## 2019-11-13 ENCOUNTER — Ambulatory Visit (INDEPENDENT_AMBULATORY_CARE_PROVIDER_SITE_OTHER): Payer: Medicare Other | Admitting: Family Medicine

## 2019-11-13 ENCOUNTER — Encounter (INDEPENDENT_AMBULATORY_CARE_PROVIDER_SITE_OTHER): Payer: Self-pay | Admitting: Family Medicine

## 2019-11-13 VITALS — BP 108/69 | HR 74 | Temp 97.8°F | Ht 61.0 in | Wt 203.0 lb

## 2019-11-13 DIAGNOSIS — R609 Edema, unspecified: Secondary | ICD-10-CM | POA: Diagnosis not present

## 2019-11-13 DIAGNOSIS — E1169 Type 2 diabetes mellitus with other specified complication: Secondary | ICD-10-CM

## 2019-11-13 DIAGNOSIS — Z6838 Body mass index (BMI) 38.0-38.9, adult: Secondary | ICD-10-CM

## 2019-11-13 MED ORDER — POTASSIUM CHLORIDE CRYS ER 10 MEQ PO TBCR: EXTENDED_RELEASE_TABLET | ORAL | 0 refills | Status: DC

## 2019-11-13 NOTE — Progress Notes (Signed)
Chief Complaint:   OBESITY Morgan Hurst is here to discuss her progress with her obesity treatment plan along with follow-up of her obesity related diagnoses.   Today's visit was #: 8 Starting weight: 213 lbs Starting date: 06/20/2019 Today's weight: 203 lbs Today's date: 20/27/2021 Total lbs lost to date: 10 lbs Body mass index is 38.36 kg/m.  Total weight loss percentage to date: -4.69%  Interim History: Morgan Hurst says she has been out of Trulicity for 3 weeks.  She is currently in the donut hole with her insurance.  Reports increased edema currently.  Nutrition Plan: keeping a food journal and adhering to recommended goals of 1200 calories and >50 grams of protein for 40% of the time. Anti-obesity medications: Currently out of Trulicity.  Hunger is moderately controlled controlled. Cravings are moderately controlled controlled.  Activity: Yard work all day 3-4 days per week.  Assessment/Plan:   1. Edema Recommend she take metolazone twice daily for 2-3 days.  Will send in potassium to be taken along with the metolazone.  Will also check labs today.  - Start potassium chloride (KLOR-CON) 10 MEQ tablet; Take 1 tablet by mouth twice daily. **Take with Metolazone.**   - B Nat Peptide - Magnesium  2. Type 2 diabetes mellitus with other specified complication, without long-term current use of insulin (Morgan Hurst) Diabetes Mellitus: well controlled. Medication: Will work on GLP-1 RA patient assistance.  Okay for her to restart metformin.  Issues reviewed with her: blood sugar goals, complications of diabetes mellitus, hypoglycemia prevention and treatment, exercise, nutrition, and carbohydrate counting.  Will check labs today.  Lab Results  Component Value Date   HGBA1C 5.2 09/25/2019   HGBA1C 5.9 05/15/2019   HGBA1C 6.0 09/04/2018   Lab Results  Component Value Date   MICROALBUR 1.8 09/04/2018   LDLCALC 118 (H) 05/15/2019   CREATININE 0.64 11/13/2019   - Comprehensive metabolic  panel - Magnesium  3. Class 2 severe obesity with serious comorbidity and body mass index (BMI) of 38.0 to 38.9 in adult, unspecified obesity type (HCC)  Morgan Hurst is currently in the action stage of change. As such, her goal is to continue with weight loss efforts.   Nutrition goals: She has agreed to practicing portion control and making smarter food choices, such as increasing vegetables and decreasing simple carbohydrates.   Exercise goals: Older adults should follow the adult guidelines. When older adults cannot meet the adult guidelines, they should be as physically active as their abilities and conditions will allow.  Older adults should do exercises that maintain or improve balance if they are at risk of falling.   Behavioral modification strategies: increasing lean protein intake, decreasing simple carbohydrates, increasing vegetables and increasing water intake.  Morgan Hurst has agreed to follow-up with our clinic in 4 weeks. She was informed of the importance of frequent follow-up visits to maximize her success with intensive lifestyle modifications for her multiple health conditions.   Objective:   Blood pressure 108/69, pulse 74, temperature 97.8 F (36.6 C), height 5' 1"  (1.549 m), weight 203 lb (92.1 kg), SpO2 95 %. Body mass index is 38.36 kg/m.  General: Cooperative, alert, well developed, in no acute distress. HEENT: Conjunctivae and lids unremarkable. Cardiovascular: Regular rhythm.  Lungs: Normal work of breathing. Neurologic: No focal deficits.   Lab Results  Component Value Date   CREATININE 0.64 11/13/2019   BUN 15 11/13/2019   NA 142 11/13/2019   K 3.9 11/13/2019   CL 103 11/13/2019   CO2 24  11/13/2019   Lab Results  Component Value Date   ALT 14 11/13/2019   AST 19 11/13/2019   ALKPHOS 84 11/13/2019   BILITOT 0.4 11/13/2019   Lab Results  Component Value Date   HGBA1C 5.2 09/25/2019   HGBA1C 5.9 05/15/2019   HGBA1C 6.0 09/04/2018   HGBA1C 5.9 12/25/2017    HGBA1C 6.0 04/11/2017   Lab Results  Component Value Date   INSULIN 20.9 06/20/2019   Lab Results  Component Value Date   TSH 3.060 06/20/2019   Lab Results  Component Value Date   CHOL 191 05/15/2019   HDL 45.60 05/15/2019   LDLCALC 118 (H) 05/15/2019   LDLDIRECT 177.7 12/25/2012   TRIG 137.0 05/15/2019   CHOLHDL 4 05/15/2019   Lab Results  Component Value Date   WBC 7.8 06/20/2019   HGB 14.9 06/20/2019   HCT 44.5 06/20/2019   MCV 89 06/20/2019   PLT 189 06/20/2019   Lab Results  Component Value Date   IRON 59 09/07/2018   FERRITIN 87.1 09/07/2018   Obesity Behavioral Intervention:   Approximately 15 minutes were spent on the discussion below.  ASK: We discussed the diagnosis of obesity with Morgan Hurst today and Morgan Hurst agreed to give [place] permission to discuss obesity behavioral modification therapy today.  ASSESS: Morgan Hurst has the diagnosis of obesity and her BMI today is 38.5. Morgan Hurst is in the action stage of change.   ADVISE: Morgan Hurst was educated on the multiple health risks of obesity as well as the benefit of weight loss to improve her health. She was advised of the need for long term treatment and the importance of lifestyle modifications to improve her current health and to decrease her risk of future health problems.  AGREE: Multiple dietary modification options and treatment options were discussed and Morgan Hurst agreed to follow the recommendations documented in the above note.  ARRANGE: Morgan Hurst was educated on the importance of frequent visits to treat obesity as outlined per CMS and USPSTF guidelines and agreed to schedule her next follow up appointment today.  Attestation Statements:   Reviewed by clinician on day of visit: allergies, medications, problem list, medical history, surgical history, family history, social history, and previous encounter notes.  I, Water quality scientist, CMA, am acting as transcriptionist for Briscoe Deutscher, DO  I have reviewed the above  documentation for accuracy and completeness, and I agree with the above. Briscoe Deutscher, DO

## 2019-11-14 ENCOUNTER — Ambulatory Visit (INDEPENDENT_AMBULATORY_CARE_PROVIDER_SITE_OTHER): Payer: Medicare Other | Admitting: Family Medicine

## 2019-11-16 LAB — BRAIN NATRIURETIC PEPTIDE

## 2019-11-18 ENCOUNTER — Other Ambulatory Visit (INDEPENDENT_AMBULATORY_CARE_PROVIDER_SITE_OTHER): Payer: Self-pay

## 2019-11-18 DIAGNOSIS — R609 Edema, unspecified: Secondary | ICD-10-CM

## 2019-11-18 LAB — COMPREHENSIVE METABOLIC PANEL
ALT: 14 IU/L (ref 0–32)
AST: 19 IU/L (ref 0–40)
Albumin/Globulin Ratio: 1.2 (ref 1.2–2.2)
Albumin: 4 g/dL (ref 3.7–4.7)
Alkaline Phosphatase: 84 IU/L (ref 44–121)
BUN/Creatinine Ratio: 23 (ref 12–28)
BUN: 15 mg/dL (ref 8–27)
Bilirubin Total: 0.4 mg/dL (ref 0.0–1.2)
CO2: 24 mmol/L (ref 20–29)
Calcium: 9.5 mg/dL (ref 8.7–10.3)
Chloride: 103 mmol/L (ref 96–106)
Creatinine, Ser: 0.64 mg/dL (ref 0.57–1.00)
GFR calc Af Amer: 102 mL/min/{1.73_m2} (ref >59)
GFR calc non Af Amer: 89 mL/min/{1.73_m2} (ref >59)
Globulin, Total: 3.4 g/dL (ref 1.5–4.5)
Glucose: 87 mg/dL (ref 65–99)
Potassium: 3.9 mmol/L (ref 3.5–5.2)
Sodium: 142 mmol/L (ref 134–144)
Total Protein: 7.4 g/dL (ref 6.0–8.5)

## 2019-11-18 LAB — MAGNESIUM: Magnesium: 1.9 mg/dL (ref 1.6–2.3)

## 2019-11-18 NOTE — Progress Notes (Signed)
BNP lab has to be redrawn due to insufficient amount, left message notifying pt.  Lab reordered

## 2019-11-25 DIAGNOSIS — Z79899 Other long term (current) drug therapy: Secondary | ICD-10-CM | POA: Diagnosis not present

## 2019-11-27 ENCOUNTER — Encounter: Payer: Self-pay | Admitting: Family

## 2019-11-28 DIAGNOSIS — R5383 Other fatigue: Secondary | ICD-10-CM | POA: Diagnosis not present

## 2019-11-28 DIAGNOSIS — R609 Edema, unspecified: Secondary | ICD-10-CM | POA: Diagnosis not present

## 2019-11-29 LAB — BRAIN NATRIURETIC PEPTIDE: BNP: 48.5 pg/mL (ref 0.0–100.0)

## 2019-12-16 ENCOUNTER — Ambulatory Visit (INDEPENDENT_AMBULATORY_CARE_PROVIDER_SITE_OTHER): Payer: Medicare Other | Admitting: Family Medicine

## 2019-12-18 ENCOUNTER — Other Ambulatory Visit: Payer: Self-pay

## 2019-12-18 ENCOUNTER — Telehealth (INDEPENDENT_AMBULATORY_CARE_PROVIDER_SITE_OTHER): Payer: Self-pay

## 2019-12-18 ENCOUNTER — Ambulatory Visit (INDEPENDENT_AMBULATORY_CARE_PROVIDER_SITE_OTHER): Payer: Medicare Other | Admitting: Family Medicine

## 2019-12-18 ENCOUNTER — Encounter (INDEPENDENT_AMBULATORY_CARE_PROVIDER_SITE_OTHER): Payer: Self-pay | Admitting: Family Medicine

## 2019-12-18 VITALS — BP 113/67 | HR 72 | Temp 97.7°F | Ht 61.0 in | Wt 207.0 lb

## 2019-12-18 DIAGNOSIS — R632 Polyphagia: Secondary | ICD-10-CM

## 2019-12-18 DIAGNOSIS — Z6839 Body mass index (BMI) 39.0-39.9, adult: Secondary | ICD-10-CM

## 2019-12-18 DIAGNOSIS — E1169 Type 2 diabetes mellitus with other specified complication: Secondary | ICD-10-CM | POA: Diagnosis not present

## 2019-12-18 DIAGNOSIS — R609 Edema, unspecified: Secondary | ICD-10-CM

## 2019-12-18 NOTE — Progress Notes (Signed)
Chief Complaint:   OBESITY Morgan Hurst is here to discuss her progress with her obesity treatment plan along with follow-up of her obesity related diagnoses.   Today's visit was #: 9 Starting weight: 213 lbs Starting date: 06/20/2019 Today's weight: 207 lbs Today's date: 12/18/2019 Total lbs lost to date: 6 lbs Body mass index is 39.11 kg/m.  Total weight loss percentage to date: -2.82%  Interim History: Morgan Hurst says she is hungry all the time.  She is not taking Trulicity due to cost.  Blood sugars are controlled, she says. Nutrition Plan: practicing portion control and making smarter food choices, such as increasing vegetables and decreasing simple carbohydrates for 30-40% of the time.  Hunger is poorly controlled.  Activity: None at this time.  Assessment/Plan:   1. Polyphagia Hyperphagia, also called polyphagia, refers to excessive feelings of hunger. This is more likely to be an issues for people that have diabetes, prediabetes, or insulin resistance. She will continue to focus on protein-rich, low simple carbohydrate foods. We reviewed the importance of hydration, regular exercise for stress reduction, and restorative sleep.  Plan:  Will see if she qualifies for Ozempic patient assistance. This will need to be obtained from PCP due to our inability to store the medication.  2. Type 2 diabetes mellitus with other specified complication, without long-term current use of insulin ([place]) Diabetes Mellitus: well controlled. Medication: None. Issues reviewed with her: blood sugar goals, complications of diabetes mellitus, hypoglycemia prevention and treatment, exercise, and nutrition.  Lab Results  Component Value Date   HGBA1C 5.2 09/25/2019   HGBA1C 5.9 05/15/2019   HGBA1C 6.0 09/04/2018   Lab Results  Component Value Date   MICROALBUR 1.8 09/04/2018   LDLCALC 118 (H) 05/15/2019   CREATININE 0.64 11/13/2019   3. Edema Improving. Recommendations: decrease sodium in the diet,  elevate feet above the level of the heart whenever possible, increase physical activity, use of compression stockings and weight loss.  4. Class 2 severe obesity with serious comorbidity and body mass index (BMI) of 39.0 to 39.9 in adult, unspecified obesity type (Morgan Hurst)  Course: Morgan Hurst is currently in the action stage of change. As such, her goal is to continue with weight loss efforts.   Nutrition goals: She has agreed to keeping a food journal and adhering to recommended goals of 1500 calories and 95 grams of protein.   Exercise goals: Older adults should follow the adult guidelines. When older adults cannot meet the adult guidelines, they should be as physically active as their abilities and conditions will allow.  Older adults should do exercises that maintain or improve balance if they are at risk of falling.   Behavioral modification strategies: increasing lean protein intake, decreasing simple carbohydrates, increasing vegetables and increasing water intake.  Morgan Hurst has agreed to follow-up with our clinic in 2 weeks. She was informed of the importance of frequent follow-up visits to maximize her success with intensive lifestyle modifications for her multiple health conditions.   Objective:   Blood pressure 113/67, pulse 72, temperature 97.7 F (36.5 C), height 5' 1"  (1.549 m), weight 207 lb (93.9 kg), SpO2 96 %. Body mass index is 39.11 kg/m.  General: Cooperative, alert, well developed, in no acute distress. HEENT: Conjunctivae and lids unremarkable. Cardiovascular: Regular rhythm.  Lungs: Normal work of breathing. Neurologic: No focal deficits.   Lab Results  Component Value Date   CREATININE 0.64 11/13/2019   BUN 15 11/13/2019   NA 142 11/13/2019   K 3.9 11/13/2019  CL 103 11/13/2019   CO2 24 11/13/2019   Lab Results  Component Value Date   ALT 14 11/13/2019   AST 19 11/13/2019   ALKPHOS 84 11/13/2019   BILITOT 0.4 11/13/2019   Lab Results  Component Value Date    HGBA1C 5.2 09/25/2019   HGBA1C 5.9 05/15/2019   HGBA1C 6.0 09/04/2018   HGBA1C 5.9 12/25/2017   HGBA1C 6.0 04/11/2017   Lab Results  Component Value Date   INSULIN 20.9 06/20/2019   Lab Results  Component Value Date   TSH 3.060 06/20/2019   Lab Results  Component Value Date   CHOL 191 05/15/2019   HDL 45.60 05/15/2019   LDLCALC 118 (H) 05/15/2019   LDLDIRECT 177.7 12/25/2012   TRIG 137.0 05/15/2019   CHOLHDL 4 05/15/2019   Lab Results  Component Value Date   WBC 7.8 06/20/2019   HGB 14.9 06/20/2019   HCT 44.5 06/20/2019   MCV 89 06/20/2019   PLT 189 06/20/2019   Lab Results  Component Value Date   IRON 59 09/07/2018   FERRITIN 87.1 09/07/2018    Obesity Behavioral Intervention:   Approximately 15 minutes were spent on the discussion below.  ASK: We discussed the diagnosis of obesity with Morgan Hurst today and Morgan Hurst agreed to give [place] permission to discuss obesity behavioral modification therapy today.  ASSESS: Morgan Hurst has the diagnosis of obesity and her BMI today is 39.2. Morgan Hurst is in the action stage of change.   ADVISE: Morgan Hurst was educated on the multiple health risks of obesity as well as the benefit of weight loss to improve her health. She was advised of the need for long term treatment and the importance of lifestyle modifications to improve her current health and to decrease her risk of future health problems.  AGREE: Multiple dietary modification options and treatment options were discussed and Morgan Hurst agreed to follow the recommendations documented in the above note.  ARRANGE: Morgan Hurst was educated on the importance of frequent visits to treat obesity as outlined per CMS and USPSTF guidelines and agreed to schedule her next follow up appointment today.  Attestation Statements:   Reviewed by clinician on day of visit: allergies, medications, problem list, medical history, surgical history, family history, social history, and previous encounter notes.  I, Network engineer, CMA, am acting as transcriptionist for Briscoe Deutscher, DO  I have reviewed the above documentation for accuracy and completeness, and I agree with the above. Briscoe Deutscher, DO

## 2019-12-18 NOTE — Telephone Encounter (Signed)
At the direction of Dr. [name], a [place] patient assistance application was started for the patient. The patient portion of the application was given to pt and all required fields were highlighted for pt to appropriately fill out. Pt was reminded that it is important to fill out all areas of the application to ensure timely and appropriate processing from [employer].  Pt was also reminded that once she has completed her portion of the application, she should make a copy for her records and then mail or fax the completed portion of the application to the number and/or address provided at the bottom of the application. Once this is done, pt should notify this office.  While pt is completing her portion, this office will complete the provider portion and then fax the other half of the completed application to the company for processing once both halves have been successfully received. Once this is completed, [employer] will review all information, and then a letter will be mailed to the patient and a copy faxed to this office notifying both provider and patient of approval or denial of the [employer] application for Ozempic 0.20m/1.5mL. Pt verbalized understanding of all information told her to her, and assistive person/family member that accompanied her to visit verbalized understanding and ensured that this would be done appropriately and in a timely manner.

## 2019-12-23 DIAGNOSIS — B351 Tinea unguium: Secondary | ICD-10-CM | POA: Diagnosis not present

## 2019-12-23 DIAGNOSIS — L600 Ingrowing nail: Secondary | ICD-10-CM | POA: Diagnosis not present

## 2019-12-23 DIAGNOSIS — M79674 Pain in right toe(s): Secondary | ICD-10-CM | POA: Diagnosis not present

## 2019-12-23 DIAGNOSIS — R7303 Prediabetes: Secondary | ICD-10-CM | POA: Diagnosis not present

## 2019-12-25 ENCOUNTER — Encounter (INDEPENDENT_AMBULATORY_CARE_PROVIDER_SITE_OTHER): Payer: Self-pay | Admitting: Family Medicine

## 2019-12-25 ENCOUNTER — Ambulatory Visit: Payer: Medicare Other | Admitting: Family

## 2019-12-26 NOTE — Telephone Encounter (Signed)
Dr Wallace pt °

## 2020-01-01 ENCOUNTER — Encounter (INDEPENDENT_AMBULATORY_CARE_PROVIDER_SITE_OTHER): Payer: Self-pay

## 2020-01-01 ENCOUNTER — Ambulatory Visit (INDEPENDENT_AMBULATORY_CARE_PROVIDER_SITE_OTHER): Payer: Medicare Other | Admitting: Family

## 2020-01-01 ENCOUNTER — Other Ambulatory Visit: Payer: Self-pay

## 2020-01-01 ENCOUNTER — Encounter (INDEPENDENT_AMBULATORY_CARE_PROVIDER_SITE_OTHER): Payer: Self-pay | Admitting: Family Medicine

## 2020-01-01 ENCOUNTER — Encounter: Payer: Self-pay | Admitting: Family

## 2020-01-01 ENCOUNTER — Encounter (INDEPENDENT_AMBULATORY_CARE_PROVIDER_SITE_OTHER): Payer: Medicare Other | Admitting: Family Medicine

## 2020-01-01 VITALS — BP 112/70 | HR 79 | Temp 98.1°F | Ht 61.0 in | Wt 208.2 lb

## 2020-01-01 DIAGNOSIS — R059 Cough, unspecified: Secondary | ICD-10-CM

## 2020-01-01 DIAGNOSIS — Z1231 Encounter for screening mammogram for malignant neoplasm of breast: Secondary | ICD-10-CM

## 2020-01-01 DIAGNOSIS — Z23 Encounter for immunization: Secondary | ICD-10-CM

## 2020-01-01 DIAGNOSIS — R0600 Dyspnea, unspecified: Secondary | ICD-10-CM | POA: Diagnosis not present

## 2020-01-01 LAB — MICROALBUMIN / CREATININE URINE RATIO
Creatinine,U: 200.9 mg/dL
Microalb Creat Ratio: 1.1 mg/g (ref 0.0–30.0)
Microalb, Ur: 2.3 mg/dL — ABNORMAL HIGH (ref 0.0–1.9)

## 2020-01-01 MED ORDER — FLUTICASONE PROPIONATE 50 MCG/ACT NA SUSP: 2.0000 | Freq: Every day | NASAL | 6 refills | Status: DC

## 2020-01-01 NOTE — Progress Notes (Signed)
Subjective:    Patient ID: Morgan Hurst, female    DOB: [birth year], 74 y.o.   MRN: [id number]  CC: Morgan Hurst is a 74 y.o. female who presents today for follow up.   HPI: Complains of dry cough, intermittent , 2 months.  Exacerbated if talking and thinks mouth 'gets dry.' Keeps water with her at all times. Left side of neck is tender.  Not related to meal. No trouble or pain swallowing. NO fever, cp, nasal congestion, ear pain, sore throat.  N/o seasonal allergies.   GERD- controlled with prn nexium. No epigastric burning.   [facility], Dr [name], for polyphagia. She has lost a total of 6 lbs.   Plan to see if she qualifies for Ozempic patient assistance.  Trulicity too expensive.  NO personal or family h/o thyroid cancer.   NAFLD- follows with [name]   Dyspnea- 'a whole lot better.' using metolazone prn  Chronic low back pain- tramadol prn   Never smoker     HISTORY:  Past Medical History:  Diagnosis Date  . Arthritis    lower back, right hip  . B12 deficiency   . Back pain   . Borderline diabetes    PCP STARTED PT ON METFORMIN IN 2016 DUE TO ELEVATED GLUCOSE   . Complication of anesthesia    FIGHTING WHEN WAKING UP FROM ANESTHESIA  . Constipation   . Diabetes mellitus without complication ([place])   . Endometriosis   . Fatty liver   . Gallbladder problem   . GERD (gastroesophageal reflux disease)   . Heart murmur   . High cholesterol   . Hip pain   . Joint pain   . Kidney stones   . Leg weakness   . Orthodontics    top front 3 teeth caps and bridge  . Prediabetes   . RA (rheumatoid arthritis) ([place])   . SOB (shortness of breath)   . Stomach ulcer   . Swelling   . Vitamin D deficiency    Past Surgical History:  Procedure Laterality Date  . ABDOMINAL HYSTERECTOMY  age 4  . ANKLE SURGERY    . CHOLECYSTECTOMY N/A 04/10/2015   Procedure: LAPAROSCOPIC CHOLECYSTECTOMY WITH INTRAOPERATIVE CHOLANGIOGRAM;  Surgeon: Robert Bellow, MD;  Location: [facility] ORS;  Service: General;  Laterality: N/A;  . COLONOSCOPY WITH PROPOFOL N/A 07/10/2017   Procedure: COLONOSCOPY WITH PROPOFOL;  Surgeon: Jonathon Bellows, MD;  Location: [facility] ENDOSCOPY;  Service: Gastroenterology;  Laterality: N/A;  . ESOPHAGOGASTRODUODENOSCOPY N/A 05/12/2015   Procedure: ESOPHAGOGASTRODUODENOSCOPY (EGD);  Surgeon: Hulen Luster, MD;  Location: [place];  Service: Gastroenterology;  Laterality: N/A;  . VAGINAL DELIVERY  3   Family History  Problem Relation Age of Onset  . Diabetes Mother   . Cancer Mother        colon and breast  . Breast cancer Mother   . Liver disease Mother   . Cancer Father        Leukemia  . Cancer Brother        colon and lung  . Cholelithiasis Daughter   . Heart disease Paternal Grandfather   . Cancer Other 29       colon  . Thyroid cancer Neg Hx     Allergies: Patient has no known allergies. Current Outpatient Medications on File Prior to Visit  Medication Sig Dispense Refill  . aspirin 81 MG tablet Take 81 mg by mouth daily.    . calcium carbonate (OSCAL)  1500 (600 Ca) MG TABS tablet Take 600 mg by mouth 2 (two) times daily with a meal.    . cholecalciferol (VITAMIN D3) 25 MCG (1000 UNIT) tablet Take 2,000 Units by mouth daily.    . Citicoline (COGNITIVE HEALTH PO) Take 1 tablet by mouth in the morning and at bedtime.    . colesevelam (WELCHOL) 625 MG tablet Take 625 mg by mouth daily.    . cyanocobalamin (,VITAMIN B-12,) 1000 MCG/ML injection INJECT 1 ML ONCE PER MONTH 1 mL 15  . esomeprazole (NEXIUM) 40 MG capsule Take by mouth.    . metolazone (ZAROXOLYN) 5 MG tablet Take 5 mg by mouth daily.    . ondansetron (ZOFRAN) 4 MG tablet Take 1 tablet (4 mg total) by mouth every 8 (eight) hours as needed for nausea or vomiting. 20 tablet 0  . potassium chloride (KLOR-CON) 10 MEQ tablet Take 1 tablet by mouth twice daily. **Take with Metolazone.** 20 tablet 0  . rOPINIRole (REQUIP) 0.25 MG tablet TAKE 1  TABLET(0.25 MG) BY MOUTH AT BEDTIME AS NEEDED 90 tablet 0  . traMADol (ULTRAM) 50 MG tablet TAKE 1 TABLET BY MOUTH DAILY AS NEEDED 30 tablet 1   No current facility-administered medications on file prior to visit.    Social History   Tobacco Use  . Smoking status: Never Smoker  . Smokeless tobacco: Never Used  Vaping Use  . Vaping Use: Never used  Substance Use Topics  . Alcohol use: No  . Drug use: No    Review of Systems  Constitutional: Negative for chills and fever.  HENT: Negative for congestion, postnasal drip and trouble swallowing.   Respiratory: Positive for cough. Negative for shortness of breath.   Cardiovascular: Negative for chest pain and palpitations.  Gastrointestinal: Negative for nausea and vomiting.      Objective:    BP 112/70   Pulse 79   Temp 98.1 F (36.7 C)   Ht 5' 1"  (1.549 m)   Wt 208 lb 3.2 oz (94.4 kg)   SpO2 97%   BMI 39.34 kg/m  BP Readings from Last 3 Encounters:  01/01/20 112/74  01/01/20 112/70  12/18/19 113/67   Wt Readings from Last 3 Encounters:  01/01/20 204 lb (92.5 kg)  01/01/20 208 lb 3.2 oz (94.4 kg)  12/18/19 207 lb (93.9 kg)    Physical Exam Vitals reviewed.  Constitutional:      Appearance: She is well-developed and well-nourished.  HENT:     Head: Normocephalic and atraumatic.     Right Ear: Hearing, tympanic membrane, ear canal and external ear normal. No decreased hearing noted. No drainage, swelling or tenderness. No middle ear effusion. No foreign body. Tympanic membrane is not erythematous or bulging.     Left Ear: Hearing, tympanic membrane, ear canal and external ear normal. No decreased hearing noted. No drainage, swelling or tenderness.  No middle ear effusion. No foreign body. Tympanic membrane is not erythematous or bulging.     Nose: Nose normal. No rhinorrhea.     Right Sinus: No maxillary sinus tenderness or frontal sinus tenderness.     Left Sinus: No maxillary sinus tenderness or frontal sinus  tenderness.     Mouth/Throat:     Mouth: Oropharynx is clear and moist and mucous membranes are normal.     Pharynx: Uvula midline. No oropharyngeal exudate, posterior oropharyngeal edema or posterior oropharyngeal erythema.     Tonsils: No tonsillar abscesses.  Eyes:     Conjunctiva/sclera: Conjunctivae normal.  Cardiovascular:     Rate and Rhythm: Normal rate and regular rhythm.     Pulses: Normal pulses.     Heart sounds: Normal heart sounds.  Pulmonary:     Effort: Pulmonary effort is normal.     Breath sounds: Normal breath sounds. No wheezing, rhonchi or rales.  Lymphadenopathy:     Head:     Right side of head: No submental, submandibular, tonsillar, preauricular, posterior auricular or occipital adenopathy.     Left side of head: No submental, submandibular, tonsillar, preauricular, posterior auricular or occipital adenopathy.     Cervical: No cervical adenopathy.  Skin:    General: Skin is warm and dry.  Neurological:     Mental Status: She is alert.  Psychiatric:        Mood and Affect: Mood and affect normal.        Speech: Speech normal.        Behavior: Behavior normal.        Thought Content: Thought content normal.        Assessment & Plan:   Problem List Items Addressed This Visit      Other   Cough - Primary    Unclear etiology. Benign exam and no lymphadenopathy. Differentials include PND, GERD. She will trial Flonase for a couple of weeks and then if no relief, she will trial Nexium QD. Emphasized if neck tenderness persisted to let me know so we can discuss imaging. If no improvement with either, she will let me know.       Relevant Medications   fluticasone (FLONASE) 50 MCG/ACT nasal spray   Other Relevant Orders   Microalbumin / creatinine urine ratio   Dyspnea    Improved on metolazone. She has follow up with pulmonology, will follow.       Severe obesity (BMI >= 40) (HCC)    Discussed black box warning as it relates to ozempic. Referral to  catie, pharmD to assist in getting medication for patient with patient assistance.       Relevant Orders   AMB Referral to [facility]    Other Visit Diagnoses    Encounter for screening mammogram for malignant neoplasm of breast       Relevant Orders   MM 3D SCREEN BREAST BILATERAL   Need for immunization against influenza       Relevant Orders   Flu Vaccine QUAD High Dose(Fluad) (Completed)      Of note, patient will schedule mammogram.   I have discontinued Laticha M. Landen's Trulicity and meloxicam. I am also having her start on fluticasone. Additionally, I am having her maintain her aspirin, esomeprazole, colesevelam, ondansetron, cholecalciferol, calcium carbonate, cyanocobalamin, metolazone, Citicoline (COGNITIVE HEALTH PO), traMADol, rOPINIRole, and potassium chloride.   Meds ordered this encounter  Medications  . fluticasone (FLONASE) 50 MCG/ACT nasal spray    Sig: Place 2 sprays into both nostrils daily.    Dispense:  16 g    Refill:  6    Order Specific Question:   Supervising Provider    Answer:   Crecencio Mc [[id number]]    Return precautions given.   Risks, benefits, and alternatives of the medications and treatment plan prescribed today were discussed, and patient expressed understanding.   Education regarding symptom management and diagnosis given to patient on AVS.  Continue to follow with Burnard Hawthorne, FNP for routine health maintenance.   [name] and I agreed with plan.   Mable Paris, FNP

## 2020-01-01 NOTE — Assessment & Plan Note (Signed)
Improved on metolazone. She has follow up with pulmonology, will follow.

## 2020-01-01 NOTE — Patient Instructions (Addendum)
Trial of flonase for possible post nasal drip.  Do this for for 2 weeks to see if improves.   If not , then trial taking nexium for 2 weeks and see if improves.   If tenderness doesn't improve on neck in next couple of weeks, please let me know as I would suggest imaging  Please call  and schedule your 3D mammogramas discussed.   [place]  Walker [place], [place]

## 2020-01-01 NOTE — Assessment & Plan Note (Signed)
Discussed black box warning as it relates to ozempic. Referral to catie, pharmD to assist in getting medication for patient with patient assistance.

## 2020-01-01 NOTE — Assessment & Plan Note (Addendum)
Unclear etiology. Benign exam and no lymphadenopathy. Differentials include PND, GERD. She will trial Flonase for a couple of weeks and then if no relief, she will trial Nexium QD. Emphasized if neck tenderness persisted to let me know so we can discuss imaging. If no improvement with either, she will let me know.

## 2020-01-02 ENCOUNTER — Telehealth: Payer: Self-pay

## 2020-01-02 NOTE — Chronic Care Management (AMB) (Signed)
°  Chronic Care Management   Outreach Note  01/02/2020 Name: Morgan Hurst MRN: [id number] DOB: 11-15-1945  Morgan Hurst is a 74 y.o. year old female who is a primary care patient of Morgan Hawthorne, FNP. I reached out to Morgan Hurst by phone today in response to a referral sent by Morgan Hurst's PCP, Morgan Paris, FNP     An unsuccessful telephone outreach was attempted today. The patient was referred to the case management team for assistance with care management and care coordination.   Follow Up Plan: A HIPAA compliant phone message was left for the patient providing contact information and requesting a return call.  The care management team will reach out to the patient again over the next 7 days.  If patient returns call to provider office, please advise to call [facility] [place], [place], [place], [place] [address] Direct Dial: [phone] [email or web address] Website: [email or web address]

## 2020-01-03 ENCOUNTER — Other Ambulatory Visit: Payer: Self-pay | Admitting: Family

## 2020-01-03 DIAGNOSIS — R8290 Unspecified abnormal findings in urine: Secondary | ICD-10-CM

## 2020-01-06 NOTE — Chronic Care Management (AMB) (Signed)
  Chronic Care Management   Note  01/06/2020 Name: DAHLIA NIFONG MRN: [id number] DOB: 05/29/1945  Morgan Hurst is a 74 y.o. year old female who is a primary care patient of Burnard Hawthorne, FNP. I reached out to [name] by phone today in response to a referral sent by Ms. Lucita Lora Stephanie's PCP, Mable Paris, FNP     Ms. Chisholm was given information about Chronic Care Management services today including:  1. CCM service includes personalized support from designated clinical staff supervised by her physician, including individualized plan of care and coordination with other care providers 2. 24/7 contact phone numbers for assistance for urgent and routine care needs. 3. Service will only be billed when office clinical staff spend 20 minutes or more in a month to coordinate care. 4. Only one practitioner may furnish and bill the service in a calendar month. 5. The patient may stop CCM services at any time (effective at the end of the month) by phone call to the office staff. 6. The patient will be responsible for cost sharing (co-pay) of up to 20% of the service fee (after annual deductible is met).  Patient agreed to services and verbal consent obtained.   Follow up plan: Telephone appointment with care management team member scheduled for:01/07/2020  Noreene Larsson, [place], [place], [place] [address] Direct Dial: [phone] [email or web address]

## 2020-01-07 ENCOUNTER — Ambulatory Visit: Payer: Medicare Other | Admitting: Pharmacist

## 2020-01-07 DIAGNOSIS — R7303 Prediabetes: Secondary | ICD-10-CM

## 2020-01-07 DIAGNOSIS — K760 Fatty (change of) liver, not elsewhere classified: Secondary | ICD-10-CM

## 2020-01-07 DIAGNOSIS — Z6838 Body mass index (BMI) 38.0-38.9, adult: Secondary | ICD-10-CM

## 2020-01-07 DIAGNOSIS — M818 Other osteoporosis without current pathological fracture: Secondary | ICD-10-CM

## 2020-01-07 NOTE — Chronic Care Management (AMB) (Addendum)
Chronic Care Management   Pharmacy Note  01/07/2020 Name: Morgan Hurst MRN: [id number] DOB: 12-11-1945  Subjective:  Morgan Hurst is a 74 y.o. year old female who is a primary care patient of Burnard Hawthorne, FNP. The CCM team was consulted for assistance with chronic disease management and care coordination needs.    Engaged with patient by telephone for initial visit in response to provider referral for pharmacy case management and/or care coordination services.   Consent to Services:  [name] was given the following information about Chronic Care Management services today, agreed to services, and gave verbal consent: 1. CCM service includes personalized support from designated clinical staff supervised by her physician, including individualized plan of care and coordination with other care providers 2. 24/7 contact phone numbers for assistance for urgent and routine care needs. 3. Service will only be billed when office clinical staff spend 20 minutes or more in a month to coordinate care. 4. Only one practitioner may furnish and bill the service in a calendar month. 5.The patient may stop CCM services at any time (effective at the end of the month) by phone call to the office staff. 6. The patient will be responsible for cost sharing (co-pay) of up to 20% of the service fee (after annual deductible is met). Patient agreed to services and consent obtained.  Objective:  Lab Results  Component Value Date   CREATININE 0.64 11/13/2019   CREATININE 0.62 06/20/2019   CREATININE 0.60 05/15/2019    Lab Results  Component Value Date   HGBA1C 5.2 09/25/2019       Component Value Date/Time   CHOL 191 05/15/2019 1222   TRIG 137.0 05/15/2019 1222   HDL 45.60 05/15/2019 1222   CHOLHDL 4 05/15/2019 1222   VLDL 27.4 05/15/2019 1222   LDLCALC 118 (H) 05/15/2019 1222   LDLDIRECT 177.7 12/25/2012 1619     Clinical ASCVD: No  The 10-year ASCVD risk score Mikey Bussing DC Jr., et al.,  2013) is: 18.9%   Values used to calculate the score:     Age: 33 years     Sex: Female     Is Non-Hispanic African American: No     Diabetic: Yes     Tobacco smoker: No     Systolic Blood Pressure: 409 mmHg     Is BP treated: No     HDL Cholesterol: 45.6 mg/dL     Total Cholesterol: 191 mg/dL     BP Readings from Last 3 Encounters:  01/01/20 112/74  01/01/20 112/70  12/18/19 113/67    Assessment/Interventions: Review of patient past medical history, allergies, medications, health status, including review of consultants reports, laboratory and other test data, was performed as part of comprehensive evaluation and provision of chronic care management services.   SDOH (Social Determinants of Health) assessments and interventions performed:  SDOH Interventions   Flowsheet Row Most Recent Value  SDOH Interventions   Financial Strain Interventions Other (Comment)  [manufacturer assistance]       CCM Care Plan  No Known Allergies  Medications Reviewed Today    Reviewed by De Hollingshead, RPH-CPP (Pharmacist) on 01/07/20 at 1308  Med List Status: <None>  Medication Order Taking? Sig Documenting Provider Last Dose Status Informant  aspirin 81 MG tablet [id number] Yes Take 81 mg by mouth daily. [provider] Taking Active   calcium carbonate (OSCAL) 1500 (600 Ca) MG TABS tablet [id number] Yes Take 600 mg by mouth daily. [provider] Taking Active  cholecalciferol (VITAMIN D3) 25 MCG (1000 UNIT) tablet [id number] Yes Take 2,000 Units by mouth daily. [provider] Taking Active   colesevelam [facility]) 625 MG tablet [id number] Yes Take 625 mg by mouth daily as needed. [provider] Taking Active   cyanocobalamin (,VITAMIN B-12,) 1000 MCG/ML injection [id number] Yes INJECT 1 ML ONCE PER MONTH Burnard Hawthorne, FNP Taking Active   esomeprazole (NEXIUM) 40 MG capsule [id number] Yes Take by mouth. [provider] Taking Active             Med Note [place], [facility] E   Wed Apr 04, 2018  3:09 PM) PRN  fluticasone (FLONASE) 50 MCG/ACT nasal spray [id number] Yes Place 2 sprays into both nostrils daily. Burnard Hawthorne, FNP Taking Active   metolazone (ZAROXOLYN) 5 MG tablet [id number] Yes Take 5 mg by mouth daily. [provider] Taking Active   ondansetron (ZOFRAN) 4 MG tablet [id number] Yes Take 1 tablet (4 mg total) by mouth every 8 (eight) hours as needed for nausea or vomiting. Marrian Salvage, FNP Taking Active   potassium chloride (KLOR-CON) 10 MEQ tablet [id number] Yes Take 1 tablet by mouth twice daily. **Take with Metolazone.[name], Danae Chen, DO Taking Active   rOPINIRole (REQUIP) 0.25 MG tablet [id number] Yes TAKE 1 TABLET(0.25 MG) BY MOUTH AT BEDTIME AS NEEDED Burnard Hawthorne, FNP Taking Active   traMADol (ULTRAM) 50 MG tablet [id number] Yes TAKE 1 TABLET BY MOUTH DAILY AS NEEDED Burnard Hawthorne, FNP Taking Active           Patient Active Problem List   Diagnosis Date Noted  . Cough 01/01/2020  . Dyspnea 03/13/2019  . Osteoporosis 02/14/2019  . Weakness of both lower extremities 08/24/2018  . Greater trochanteric bursitis, left 04/24/2018  . Vitamin D deficiency 04/06/2018  . Trigger point of left shoulder region 12/26/2017  . Frozen shoulder 11/14/2017  . GERD (gastroesophageal reflux disease) 03/17/2017  . Greater trochanteric bursitis of right hip 09/07/2016  . Chronic midline low back pain without sciatica 04/25/2016  . Fatty liver disease, nonalcoholic 38/93/7342  . Nodule of left lung 11/25/2015  . Prediabetes 08/24/2015  . Muscle cramp 02/05/2015  . B12 deficiency 02/05/2015  . Cervical disc disorder with radiculopathy of cervical region 12/08/2014  . Trapezius muscle spasm 11/20/2014  . Nonallopathic lesion of thoracic region 11/20/2014  . Trigger thumb 10/23/2014  . Pain of right thumb 10/07/2014  . Spinal stenosis, lumbar region, with neurogenic claudication 08/01/2014  .  Hemorrhoid 06/18/2014  . Postmenopausal estrogen deficiency 03/18/2014  . Skin lesion 03/18/2014  . Severe obesity (BMI >= 40) ([place]) 02/14/2014  . Myalgia and myositis 02/14/2014  . Arthralgia 12/25/2012  . Pernicious anemia 12/25/2012  . Memory loss 12/25/2012  . Hyperlipidemia 01/25/2012    Conditions to be addressed/monitored per PCP order: weight, pre diabetes  Patient Care Plan: Medication Management    Problem Identified: Weight Management, Pre-Diabetes, NAFLD, Osteoporosis     Long-Range Goal: Disease Progression Prevention   This Visit's Progress: On track  Priority: High  Note:   Current Barriers:  . Unable to independently afford treatment regimen . Unable to independently achieve control of weight   Pharmacist Clinical Goal(s):  Marland Kitchen Over the next 90 days, patient will verbalize ability to afford treatment regimen . Over the next 90 days, patient will achieve improvement in weight   Interventions: . 1:1 collaboration with Burnard Hawthorne, FNP regarding development and update of comprehensive plan of care as evidenced by provider attestation  and co-signature . Inter-disciplinary care team collaboration (see longitudinal plan of care) . Comprehensive medication review performed; medication list updated in electronic medical record  Obesity: . [facility], but has not met goal 5% weight loss. Was on Trulicity, but was not experiencing benefit. Interested in transition to [place], but is in the Coverage Gap and was unable to afford. Collaborated with Dr. [name] on [employer] patient assistance, but the clinic would be unable to accept the shipment due to storage space in the office . Discussed [employer] patient assistance. Patient will bring by her portion of application. Will collaborate w/ PCP on provider portion so that order ships to our office for patient to pick up when avaialable. Will collaborate w/ PCP to re-order Ozempic 0.5 mg as  No Print to her chart . Encouraged continued collaboration with Dr. [name] and the team at [facility]  ASCVD Risk Reduction: . No statin regimen at this time. 10 year ASCVD risk >7.5%. Previously on atorvastatin, but wished to discontinue d/t the associated risk of diabetes. Reports of joint/muscle pains at baseline in other visits.   . Antiplatelet regimen: aspirin 81 mg daily . Continue to discuss dietary modifications. Could consider alternative moderate intensity statin therapy moving forward, also given benefit in fatty liver disease (ie rosuvastatin 5-10 mg daily)  Osteoporosis: . Current treatment: declined bisphosphonate. Calcium 600 mg + Vitamin D 2000 units daily . Last DEXA: , t score  . Recommend to continue current regimen at this time. Will further discuss strength bearing exercises moving forward.   GI concerns: . PRN use of colesevelam (diarrhea), docusate (constipation), ondansetron (nausea), esomeprazole 40 mg (GERD). Infrequent use.  Marland Kitchen Recommend to continue current regimen at this time. Will more further discuss these concerns moving forward.   Edema: . Managed; current regimen: metolazone 5 mg + potassium 10 mg BID PRN swelling; patient notes that she takes daily . Continue current regimen at this time  Leg "Cramps", Cervicular Radiculopathy: . Well managed per patient report; current regimen: ropinirole 0.25 mg QPM PRN for cramps; tramadol 50 mg PRN pain . Continue current regimen at this time   Patient Goals/Self-Care Activities . Over the next 90 days, patient will:  - collaborate with provider on medication access solutions target a minimum of 150 minutes of moderate intensity exercise weekly engage in dietary modifications by continuing to reduce saturated fats, reducing carbohydrate portions  Follow Up Plan: Telephone follow up appointment with care management team member scheduled for: ~ 6 weeks      Medication Assistance: Application for [employer]  (Ozempic) medication assistance program in process. Anticipated assistance start date TBD. See plan of care above for additional detail.   Plan: Telephone follow up appointment with care management team member scheduled for: ~ 6 weeks  Catie Darnelle Maffucci, PharmD, [place], CPP Clinical Pharmacist [place] at [facility] [phone]    I have collaborated with the care management provider regarding care management and care coordination activities outlined in this encounter and have reviewed this encounter including documentation in the note and care plan. I am certifying that I agree with the content of this note and encounter as primary care provider.   Mable Paris, NP

## 2020-01-07 NOTE — Patient Instructions (Addendum)
Morgan Hurst,   It was great talking with you today!  I have your portion of the [employer] application and your proof of income, and Morgan Hurst's signature on the provider portion, we'll get this sent in to the manufacturer.   Call me with any questions or concerns in the meantime!  Morgan Hurst, PharmD [phone]  Visit Information  Patient Care Plan: Medication Management    Problem Identified: Weight Management, Pre-Diabetes, NAFLD, Osteoporosis     Long-Range Goal: Disease Progression Prevention   This Visit's Progress: On track  Priority: High  Note:   Current Barriers:  . Unable to independently afford treatment regimen . Unable to independently achieve control of weight   Pharmacist Clinical Goal(s):  Morgan Hurst Kitchen Over the next 90 days, patient will verbalize ability to afford treatment regimen . Over the next 90 days, patient will achieve improvement in weight   Interventions: . 1:1 collaboration with Morgan Hawthorne, FNP regarding development and update of comprehensive plan of care as evidenced by provider attestation and co-signature . Inter-disciplinary care team collaboration (see longitudinal plan of care) . Comprehensive medication review performed; medication list updated in electronic medical record  Obesity: . [facility], but has not met goal 5% weight loss. Was on Trulicity, but was not experiencing benefit. Interested in transition to [place], but is in the Coverage Gap and was unable to afford. Collaborated with Dr. Juleen Hurst on [employer] patient assistance, but the clinic would be unable to accept the shipment due to storage space in the office . Discussed [employer] patient assistance. Patient will bring by her portion of application. Will collaborate w/ PCP on provider portion so that order ships to our office for patient to pick up when avaialable. Will collaborate w/ PCP to re-order Ozempic 0.5 mg as No Print to her  chart . Encouraged continued collaboration with Dr. Juleen Hurst and the team at [facility]  ASCVD Risk Reduction: . No statin regimen at this time. 10 year ASCVD risk >7.5%. Previously on atorvastatin, but wished to discontinue d/t the associated risk of diabetes. Reports of joint/muscle pains at baseline in other visits.   . Antiplatelet regimen: aspirin 81 mg daily . Continue to discuss dietary modifications. Could consider alternative moderate intensity statin therapy moving forward, also given benefit in fatty liver disease (ie rosuvastatin 5-10 mg daily)  Osteoporosis: . Current treatment: declined bisphosphonate. Calcium 600 mg + Vitamin D 2000 units daily . Last DEXA: , t score  . Recommend to continue current regimen at this time. Will further discuss strength bearing exercises moving forward.   GI concerns: . PRN use of colesevelam (diarrhea), docusate (constipation), ondansetron (nausea), esomeprazole 40 mg (GERD). Infrequent use.  Morgan Hurst Kitchen Recommend to continue current regimen at this time. Will more further discuss these concerns moving forward.   Edema: . Managed; current regimen: metolazone 5 mg + potassium 10 mg BID PRN swelling; patient notes that she takes daily . Continue current regimen at this time  Leg "Cramps", Cervicular Radiculopathy: . Well managed per patient report; current regimen: ropinirole 0.25 mg QPM PRN for cramps; tramadol 50 mg PRN pain . Continue current regimen at this time   Patient Goals/Self-Care Activities . Over the next 90 days, patient will:  - collaborate with provider on medication access solutions target a minimum of 150 minutes of moderate intensity exercise weekly engage in dietary modifications by continuing to reduce saturated fats, reducing carbohydrate portions  Follow Up Plan: Telephone follow up appointment with care management  team member scheduled for: ~ 6 weeks      Morgan Hurst was given information about Chronic Care  Management services today including:  1. CCM service includes personalized support from designated clinical staff supervised by her physician, including individualized plan of care and coordination with other care providers 2. 24/7 contact phone numbers for assistance for urgent and routine care needs. 3. Service will only be billed when office clinical staff spend 20 minutes or more in a month to coordinate care. 4. Only one practitioner may furnish and bill the service in a calendar month. 5. The patient may stop CCM services at any time (effective at the end of the month) by phone call to the office staff. 6. The patient will be responsible for cost sharing (co-pay) of up to 20% of the service fee (after annual deductible is met).  Patient agreed to services and verbal consent obtained.   Print copy of patient instructions, educational materials, and care plan provided in person.  Plan: Telephone follow up appointment with care management team member scheduled for: ~ 6 weeks  Morgan Hurst, PharmD, [place], [place] Clinical Pharmacist [employer] at [employer] [phone]

## 2020-01-08 ENCOUNTER — Ambulatory Visit: Payer: Medicare Other | Admitting: Pharmacist

## 2020-01-08 DIAGNOSIS — M818 Other osteoporosis without current pathological fracture: Secondary | ICD-10-CM

## 2020-01-08 DIAGNOSIS — E785 Hyperlipidemia, unspecified: Secondary | ICD-10-CM

## 2020-01-08 DIAGNOSIS — K760 Fatty (change of) liver, not elsewhere classified: Secondary | ICD-10-CM

## 2020-01-08 DIAGNOSIS — R7303 Prediabetes: Secondary | ICD-10-CM

## 2020-01-08 NOTE — Patient Instructions (Signed)
Visit Information  Patient Care Plan: Medication Management    Problem Identified: Weight Management, Pre-Diabetes, NAFLD, Osteoporosis     Long-Range Goal: Disease Progression Prevention   Recent Progress: On track  Priority: High  Note:   Current Barriers:  . Unable to independently afford treatment regimen . Unable to independently achieve control of weight   Pharmacist Clinical Goal(s):  Marland Kitchen Over the next 90 days, patient will verbalize ability to afford treatment regimen . Over the next 90 days, patient will achieve improvement in weight   Interventions: . 1:1 collaboration with Burnard Hawthorne, FNP regarding development and update of comprehensive plan of care as evidenced by provider attestation and co-signature . Inter-disciplinary care team collaboration (see longitudinal plan of care) . Comprehensive medication review performed; medication list updated in electronic medical record  Obesity: . [facility], but has not met goal 5% weight loss. Was on Trulicity, but was not experiencing benefit. Interested in transition to [place], but is in the Coverage Gap and was unable to afford. Collaborated with Dr. [name] on [employer] patient assistance, but the clinic would be unable to accept the shipment due to storage space in the office . Received patient and provider portions of [employer] application. Submitted on patient's behalf today. Will collaborate w/ CPhT on follow up.  ASCVD Risk Reduction: . No statin regimen at this time. 10 year ASCVD risk >7.5%. Previously on atorvastatin, but wished to discontinue d/t the associated risk of diabetes. Reports of joint/muscle pains at baseline in other visits.   . Antiplatelet regimen: aspirin 81 mg daily . Continue to discuss dietary modifications. Could consider alternative moderate intensity statin therapy moving forward, also given benefit in fatty liver disease (ie rosuvastatin 5-10 mg  daily)  Osteoporosis: . Current treatment: declined bisphosphonate. Calcium 600 mg + Vitamin D 2000 units daily . Last DEXA: , t score  . Recommend to continue current regimen at this time. Will further discuss strength bearing exercises moving forward.   GI concerns: . PRN use of colesevelam (diarrhea), docusate (constipation), ondansetron (nausea), esomeprazole 40 mg (GERD). Infrequent use.  Marland Kitchen Recommend to continue current regimen at this time. Will more further discuss these concerns moving forward.   Edema: . Managed; current regimen: metolazone 5 mg + potassium 10 mg BID PRN swelling; patient notes that she takes daily . Continue current regimen at this time  Leg "Cramps", Cervicular Radiculopathy: . Well managed per patient report; current regimen: ropinirole 0.25 mg QPM PRN for cramps; tramadol 50 mg PRN pain . Continue current regimen at this time   Patient Goals/Self-Care Activities . Over the next 90 days, patient will:  - collaborate with provider on medication access solutions target a minimum of 150 minutes of moderate intensity exercise weekly engage in dietary modifications by continuing to reduce saturated fats, reducing carbohydrate portions  Follow Up Plan: Telephone follow up appointment with care management team member scheduled for: ~ 6 weeks       The patient verbalized understanding of instructions, educational materials, and care plan provided today and declined offer to receive copy of patient instructions, educational materials, and care plan.  Plan: Telephone follow up appointment with care management team member scheduled for:~ 6 weeks as previously scheduled  Catie Darnelle Maffucci, PharmD, [place], [place] Pharmacist [employer] at [employer] [phone]

## 2020-01-08 NOTE — Chronic Care Management (AMB) (Addendum)
Chronic Care Management   Pharmacy Note  01/08/2020 Name: Morgan Hurst MRN: [id number] DOB: 02-May-1945  Subjective:  Morgan Hurst is a 74 y.o. year old female who is a primary care patient of Burnard Hawthorne, FNP. The CCM team was consulted for assistance with chronic disease management and care coordination needs.    Engaged with patient by telephone for follow up on patient assistance application in response to provider referral for pharmacy case management and/or care coordination services.   Consent to Services:  [name] was given information about Chronic Care Management services, agreed to services, and gave verbal consent prior to initiation of services on 01/07/20. Please see initial visit note for detailed documentation.   Objective:  Lab Results  Component Value Date   CREATININE 0.64 11/13/2019   CREATININE 0.62 06/20/2019   CREATININE 0.60 05/15/2019    Lab Results  Component Value Date   HGBA1C 5.2 09/25/2019       Component Value Date/Time   CHOL 191 05/15/2019 1222   TRIG 137.0 05/15/2019 1222   HDL 45.60 05/15/2019 1222   CHOLHDL 4 05/15/2019 1222   VLDL 27.4 05/15/2019 1222   LDLCALC 118 (H) 05/15/2019 1222   LDLDIRECT 177.7 12/25/2012 1619     BP Readings from Last 3 Encounters:  01/01/20 112/74  01/01/20 112/70  12/18/19 113/67    Assessment/Interventions: Review of patient past medical history, allergies, medications, health status, including review of consultants reports, laboratory and other test data, was performed as part of comprehensive evaluation and provision of chronic care management services.   SDOH (Social Determinants of Health) assessments and interventions performed:  SDOH Interventions   Flowsheet Row Most Recent Value  SDOH Interventions   Financial Strain Interventions Other (Comment)  [manufacturer assistance]       CCM Care Plan  No Known Allergies  Medications Reviewed Today    Reviewed by De Hollingshead, RPH-CPP (Pharmacist) on 01/07/20 at 1308  Med List Status: <None>  Medication Order Taking? Sig Documenting Provider Last Dose Status Informant  aspirin 81 MG tablet [id number] Yes Take 81 mg by mouth daily. [provider] Taking Active   calcium carbonate (OSCAL) 1500 (600 Ca) MG TABS tablet [id number] Yes Take 600 mg by mouth daily. [provider] Taking Active   cholecalciferol (VITAMIN D3) 25 MCG (1000 UNIT) tablet [id number] Yes Take 2,000 Units by mouth daily. [provider] Taking Active   colesevelam [facility]) 625 MG tablet [id number] Yes Take 625 mg by mouth daily as needed. [provider] Taking Active   cyanocobalamin (,VITAMIN B-12,) 1000 MCG/ML injection [id number] Yes INJECT 1 ML ONCE PER MONTH Burnard Hawthorne, FNP Taking Active   esomeprazole (NEXIUM) 40 MG capsule [id number] Yes Take by mouth. [provider] Taking Active            Med Note [place], [facility]   Wed Apr 04, 2018  3:09 PM) PRN  fluticasone (FLONASE) 50 MCG/ACT nasal spray [id number] Yes Place 2 sprays into both nostrils daily. Burnard Hawthorne, FNP Taking Active   metolazone (ZAROXOLYN) 5 MG tablet [id number] Yes Take 5 mg by mouth daily. [provider] Taking Active   ondansetron (ZOFRAN) 4 MG tablet [id number] Yes Take 1 tablet (4 mg total) by mouth every 8 (eight) hours as needed for nausea or vomiting. Marrian Salvage, FNP Taking Active   potassium chloride (KLOR-CON) 10 MEQ tablet [id number] Yes Take 1 tablet by mouth twice daily. **Take with Metolazone.[name],  Danae Chen, DO Taking Active   rOPINIRole (REQUIP) 0.25 MG tablet [id number] Yes TAKE 1 TABLET(0.25 MG) BY MOUTH AT BEDTIME AS NEEDED Burnard Hawthorne, FNP Taking Active   traMADol (ULTRAM) 50 MG tablet [id number] Yes TAKE 1 TABLET BY MOUTH DAILY AS NEEDED Burnard Hawthorne, FNP Taking Active           Patient Active Problem List   Diagnosis Date Noted  . Cough 01/01/2020  .  Dyspnea 03/13/2019  . Osteoporosis 02/14/2019  . Weakness of both lower extremities 08/24/2018  . Greater trochanteric bursitis, left 04/24/2018  . Vitamin D deficiency 04/06/2018  . Trigger point of left shoulder region 12/26/2017  . Frozen shoulder 11/14/2017  . GERD (gastroesophageal reflux disease) 03/17/2017  . Greater trochanteric bursitis of right hip 09/07/2016  . Chronic midline low back pain without sciatica 04/25/2016  . Fatty liver disease, nonalcoholic 26/37/8588  . Nodule of left lung 11/25/2015  . Prediabetes 08/24/2015  . Muscle cramp 02/05/2015  . B12 deficiency 02/05/2015  . Cervical disc disorder with radiculopathy of cervical region 12/08/2014  . Trapezius muscle spasm 11/20/2014  . Nonallopathic lesion of thoracic region 11/20/2014  . Trigger thumb 10/23/2014  . Pain of right thumb 10/07/2014  . Spinal stenosis, lumbar region, with neurogenic claudication 08/01/2014  . Hemorrhoid 06/18/2014  . Postmenopausal estrogen deficiency 03/18/2014  . Skin lesion 03/18/2014  . Severe obesity (BMI >= 40) ([place]) 02/14/2014  . Myalgia and myositis 02/14/2014  . Arthralgia 12/25/2012  . Pernicious anemia 12/25/2012  . Memory loss 12/25/2012  . Hyperlipidemia 01/25/2012    Conditions to be addressed/monitored per PCP order: weight management, hLD  Patient Care Plan: Medication Management    Problem Identified: Weight Management, Pre-Diabetes, NAFLD, Osteoporosis     Long-Range Goal: Disease Progression Prevention   Recent Progress: On track  Priority: High  Note:   Current Barriers:  . Unable to independently afford treatment regimen . Unable to independently achieve control of weight   Pharmacist Clinical Goal(s):  Marland Kitchen Over the next 90 days, patient will verbalize ability to afford treatment regimen . Over the next 90 days, patient will achieve improvement in weight   Interventions: . 1:1 collaboration with Burnard Hawthorne, FNP regarding development and  update of comprehensive plan of care as evidenced by provider attestation and co-signature . Inter-disciplinary care team collaboration (see longitudinal plan of care) . Comprehensive medication review performed; medication list updated in electronic medical record  Obesity: . Progress with [facility], but has not met goal 5% weight loss. Was on Trulicity, but was not experiencing benefit. Interested in transition to [place], but is in the Coverage Gap and was unable to afford. Collaborated with Dr. [name] on [employer] patient assistance, but the clinic would be unable to accept the shipment due to storage space in the office . Received patient and provider portions of [employer] application. Submitted on patient's behalf today. Will collaborate w/ CPhT on follow up.  ASCVD Risk Reduction: . No statin regimen at this time. 10 year ASCVD risk >7.5%. Previously on atorvastatin, but wished to discontinue d/t the associated risk of diabetes. Reports of joint/muscle pains at baseline in other visits.   . Antiplatelet regimen: aspirin 81 mg daily . Continue to discuss dietary modifications. Could consider alternative moderate intensity statin therapy moving forward, also given benefit in fatty liver disease (ie rosuvastatin 5-10 mg daily)  Osteoporosis: . Current treatment: declined bisphosphonate. Calcium 600 mg + Vitamin D 2000 units daily . Last DEXA: ,  t score  . Recommend to continue current regimen at this time. Will further discuss strength bearing exercises moving forward.   GI concerns: . PRN use of colesevelam (diarrhea), docusate (constipation), ondansetron (nausea), esomeprazole 40 mg (GERD). Infrequent use.  Marland Kitchen Recommend to continue current regimen at this time. Will more further discuss these concerns moving forward.   Edema: . Managed; current regimen: metolazone 5 mg + potassium 10 mg BID PRN swelling; patient notes that she takes daily . Continue current regimen  at this time  Leg "Cramps", Cervicular Radiculopathy: . Well managed per patient report; current regimen: ropinirole 0.25 mg QPM PRN for cramps; tramadol 50 mg PRN pain . Continue current regimen at this time   Patient Goals/Self-Care Activities . Over the next 90 days, patient will:  - collaborate with provider on medication access solutions target a minimum of 150 minutes of moderate intensity exercise weekly engage in dietary modifications by continuing to reduce saturated fats, reducing carbohydrate portions  Follow Up Plan: Telephone follow up appointment with care management team member scheduled for: ~ 6 weeks      Medication Assistance: Application for [employer] (Ozempic) medication assistance program in process. Anticipated assistance start date TBD. See plan of care above for additional detail.   Plan: Telephone follow up appointment with care management team member scheduled for:~ 6 weeks as previously scheduled  Catie Darnelle Maffucci, PharmD, [place], CPP Clinical Pharmacist [place] [facility] [place]    I have collaborated with the care management provider regarding care management and care coordination activities outlined in this encounter and have reviewed this encounter including documentation in the note and care plan. I am certifying that I agree with the content of this note and encounter as primary care provider.   Mable Paris, NP

## 2020-01-09 NOTE — Progress Notes (Signed)
Error - patient saw PCP same day, so appointment canceled. Briscoe Deutscher, DO

## 2020-01-11 ENCOUNTER — Other Ambulatory Visit: Payer: Self-pay | Admitting: Family

## 2020-01-11 DIAGNOSIS — R252 Cramp and spasm: Secondary | ICD-10-CM

## 2020-01-15 DIAGNOSIS — R7303 Prediabetes: Secondary | ICD-10-CM | POA: Diagnosis not present

## 2020-01-15 DIAGNOSIS — L600 Ingrowing nail: Secondary | ICD-10-CM | POA: Diagnosis not present

## 2020-01-15 DIAGNOSIS — B351 Tinea unguium: Secondary | ICD-10-CM | POA: Diagnosis not present

## 2020-01-15 DIAGNOSIS — M79674 Pain in right toe(s): Secondary | ICD-10-CM | POA: Diagnosis not present

## 2020-01-23 ENCOUNTER — Other Ambulatory Visit: Payer: Self-pay | Admitting: Family

## 2020-01-23 ENCOUNTER — Other Ambulatory Visit (INDEPENDENT_AMBULATORY_CARE_PROVIDER_SITE_OTHER): Payer: Self-pay | Admitting: Family Medicine

## 2020-01-23 DIAGNOSIS — R609 Edema, unspecified: Secondary | ICD-10-CM

## 2020-01-23 DIAGNOSIS — M48062 Spinal stenosis, lumbar region with neurogenic claudication: Secondary | ICD-10-CM

## 2020-01-23 NOTE — Telephone Encounter (Signed)
Dr Wallace pt °

## 2020-01-29 ENCOUNTER — Encounter: Payer: Self-pay | Admitting: Family

## 2020-02-05 ENCOUNTER — Other Ambulatory Visit: Payer: Self-pay

## 2020-02-05 ENCOUNTER — Telehealth (INDEPENDENT_AMBULATORY_CARE_PROVIDER_SITE_OTHER): Payer: Medicare Other | Admitting: Family Medicine

## 2020-02-05 ENCOUNTER — Encounter (INDEPENDENT_AMBULATORY_CARE_PROVIDER_SITE_OTHER): Payer: Self-pay | Admitting: Family Medicine

## 2020-02-05 DIAGNOSIS — R632 Polyphagia: Secondary | ICD-10-CM | POA: Diagnosis not present

## 2020-02-05 DIAGNOSIS — R7303 Prediabetes: Secondary | ICD-10-CM | POA: Diagnosis not present

## 2020-02-05 DIAGNOSIS — E8881 Metabolic syndrome: Secondary | ICD-10-CM | POA: Diagnosis not present

## 2020-02-05 DIAGNOSIS — Z6838 Body mass index (BMI) 38.0-38.9, adult: Secondary | ICD-10-CM

## 2020-02-05 DIAGNOSIS — K7581 Nonalcoholic steatohepatitis (NASH): Secondary | ICD-10-CM | POA: Diagnosis not present

## 2020-02-10 NOTE — Progress Notes (Signed)
TeleHealth Visit:  Due to the COVID-19 pandemic, this visit was completed with telemedicine (audio/video) technology to reduce patient and provider exposure as well as to preserve personal protective equipment.   [name] has verbally consented to this TeleHealth visit. The patient is located at home, the provider is located at the [facility] office. The participants in this visit include the listed provider and patient. The visit was conducted today via MyChart video.  Chief Complaint: OBESITY Morgan Hurst is here to discuss her progress with her obesity treatment plan along with follow-up of her obesity related diagnoses. Morgan Hurst is on keeping a food journal and adhering to recommended goals of 1200 calories and 95 grams of protein and states she is following her eating plan approximately 30-40% of the time. Morgan Hurst states she is not exercising regularly at this time.  Today's visit was #: 10 Starting weight: 213 lbs Starting date: 06/20/2019  Interim History: Dailin reports that her weight was 216 pounds this morning.  She endorses polyphagia and cravings for sweets.  She says she is taking metformin 500 mg while waiting for Ozempic.  She has not been checking her blood sugars.  She has increased her protein intake.  Assessment/Plan:   1. Prediabetes At goal. Goal is HgbA1c < 5.7.  Medication: metformin 500 mg.  She will continue to focus on protein-rich, low simple carbohydrate foods. We reviewed the importance of hydration, regular exercise for stress reduction, and restorative sleep.   Lab Results  Component Value Date   HGBA1C 5.2 09/25/2019   Lab Results  Component Value Date   INSULIN 20.9 06/20/2019   2. Polyphagia Will be starting semaglutide.  Hyperphagia, also called polyphagia, refers to excessive feelings of hunger. This is more likely to be an issues for people that have diabetes, prediabetes, or insulin resistance. She will continue to focus on protein-rich, low  simple carbohydrate foods. We reviewed the importance of hydration, regular exercise for stress reduction, and restorative sleep.  3. Metabolic syndrome She has been approved for Ozempic - still waiting.  Starting goal: Lose 7-10% of starting weight. She will continue to focus on protein-rich, low simple carbohydrate foods. We reviewed the importance of hydration, regular exercise for stress reduction, and restorative sleep.  We will continue to check lab work every 3 months, with 10% weight loss, or should any other concerns arise.  4. NASH (nonalcoholic steatohepatitis) Treatment goal: 7-10% reduction of body weight.  Aerobic and resistance physical activity are important to help achieve a healthy body weight BUT also increases peripheral insulin sensitivity, reducing delivery of free fatty acids and glucose to the liver. Exercise also increases intrahepatic fatty acid oxidation, decreasing fatty acid synthesis, and helps prevent mitochondrial and hepatocellular damage. Medications that can help reduce hepatic fat include metformin and GLP1RAs.   5. Class 2 severe obesity with serious comorbidity and body mass index (BMI) of 38.0 to 38.9 in adult, unspecified obesity type (HCC)  Morgan Hurst is currently in the action stage of change. As such, her goal is to continue with weight loss efforts. She has agreed to keeping a food journal and adhering to recommended goals of 1200 calories and 95 grams of protein.   Exercise goals: Older adults should follow the adult guidelines. When older adults cannot meet the adult guidelines, they should be as physically active as their abilities and conditions will allow.  Older adults should do exercises that maintain or improve balance if they are at risk of falling.   Behavioral  modification strategies: increasing lean protein intake, decreasing simple carbohydrates, increasing vegetables, increasing water intake and decreasing liquid calories.  Okay "sweet" allowance  at meals.  Morgan Hurst has agreed to follow-up with our clinic in 3 weeks. She was informed of the importance of frequent follow-up visits to maximize her success with intensive lifestyle modifications for her multiple health conditions.  Objective:   VITALS: Per patient if applicable, see vitals. GENERAL: Alert and in no acute distress. CARDIOPULMONARY: No increased WOB. Speaking in clear sentences.  PSYCH: Pleasant and cooperative. Speech normal rate and rhythm. Affect is appropriate. Insight and judgement are appropriate. Attention is focused, linear, and appropriate.  NEURO: Oriented as arrived to appointment on time with no prompting.   Lab Results  Component Value Date   CREATININE 0.64 11/13/2019   BUN 15 11/13/2019   NA 142 11/13/2019   K 3.9 11/13/2019   CL 103 11/13/2019   CO2 24 11/13/2019   Lab Results  Component Value Date   ALT 14 11/13/2019   AST 19 11/13/2019   ALKPHOS 84 11/13/2019   BILITOT 0.4 11/13/2019   Lab Results  Component Value Date   HGBA1C 5.2 09/25/2019   HGBA1C 5.9 05/15/2019   HGBA1C 6.0 09/04/2018   HGBA1C 5.9 12/25/2017   HGBA1C 6.0 04/11/2017   Lab Results  Component Value Date   INSULIN 20.9 06/20/2019   Lab Results  Component Value Date   TSH 3.060 06/20/2019   Lab Results  Component Value Date   CHOL 191 05/15/2019   HDL 45.60 05/15/2019   LDLCALC 118 (H) 05/15/2019   LDLDIRECT 177.7 12/25/2012   TRIG 137.0 05/15/2019   CHOLHDL 4 05/15/2019   Lab Results  Component Value Date   WBC 7.8 06/20/2019   HGB 14.9 06/20/2019   HCT 44.5 06/20/2019   MCV 89 06/20/2019   PLT 189 06/20/2019   Lab Results  Component Value Date   IRON 59 09/07/2018   FERRITIN 87.1 09/07/2018   Attestation Statements:   Reviewed by clinician on day of visit: allergies, medications, problem list, medical history, surgical history, family history, social history, and previous encounter notes.  I, Water quality scientist, CMA, am acting as transcriptionist for  Briscoe Deutscher, DO  I have reviewed the above documentation for accuracy and completeness, and I agree with the above. Briscoe Deutscher, DO

## 2020-02-17 ENCOUNTER — Ambulatory Visit: Payer: Medicare Other | Admitting: Pharmacist

## 2020-02-17 ENCOUNTER — Telehealth: Payer: Self-pay | Admitting: Pharmacist

## 2020-02-17 DIAGNOSIS — E785 Hyperlipidemia, unspecified: Secondary | ICD-10-CM

## 2020-02-17 DIAGNOSIS — Z6838 Body mass index (BMI) 38.0-38.9, adult: Secondary | ICD-10-CM

## 2020-02-17 DIAGNOSIS — R0600 Dyspnea, unspecified: Secondary | ICD-10-CM

## 2020-02-17 DIAGNOSIS — E8881 Metabolic syndrome: Secondary | ICD-10-CM

## 2020-02-17 DIAGNOSIS — M818 Other osteoporosis without current pathological fracture: Secondary | ICD-10-CM

## 2020-02-17 DIAGNOSIS — K760 Fatty (change of) liver, not elsewhere classified: Secondary | ICD-10-CM

## 2020-02-17 NOTE — Telephone Encounter (Signed)
I did not see a PA on file in chart so filed PA on cover my meds :  Ancil Linsey Key: Anne Shutter - PA Case ID: [id number]

## 2020-02-17 NOTE — Chronic Care Management (AMB) (Addendum)
Chronic Care Management Pharmacy Note  02/17/2020 Name:  Morgan Hurst MRN:  [id number] DOB:  May 01, 1945  Subjective: Morgan Hurst is an 75 y.o. year old female who is a primary patient of Burnard Hawthorne, FNP.  The CCM team was consulted for assistance with disease management and care coordination needs.    Engaged with patient by telephone for follow up visit in response to provider referral for pharmacy case management and/or care coordination services.   Consent to Services:  The patient was given information about Chronic Care Management services, agreed to services, and gave verbal consent prior to initiation of services.  Please see initial visit note for detailed documentation.   Objective:  Lab Results  Component Value Date   CREATININE 0.64 11/13/2019   CREATININE 0.62 06/20/2019   CREATININE 0.60 05/15/2019    Lab Results  Component Value Date   HGBA1C 5.2 09/25/2019       Component Value Date/Time   CHOL 191 05/15/2019 1222   TRIG 137.0 05/15/2019 1222   HDL 45.60 05/15/2019 1222   CHOLHDL 4 05/15/2019 1222   VLDL 27.4 05/15/2019 1222   LDLCALC 118 (H) 05/15/2019 1222   LDLDIRECT 177.7 12/25/2012 1619     Clinical ASCVD: No  The 10-year ASCVD risk score Mikey Bussing DC Jr., et al., 2013) is: 18.9%   Values used to calculate the score:     Age: 24 years     Sex: Female     Is Non-Hispanic African American: No     Diabetic: Yes     Tobacco smoker: No     Systolic Blood Pressure: 315 mmHg     Is BP treated: No     HDL Cholesterol: 45.6 mg/dL     Total Cholesterol: 191 mg/dL    DEXA 02/12/19:  ASSESSMENT: The BMD measured at Femur Neck Left is 0.633 g/cm2 with a T-score of -2.9.  BP Readings from Last 3 Encounters:  01/01/20 112/74  01/01/20 112/70  12/18/19 113/67    Assessment: Review of patient past medical history, allergies, medications, health status, including review of consultants reports, laboratory and other test data, was performed  as part of comprehensive evaluation and provision of chronic care management services.   SDOH:  (Social Determinants of Health) assessments and interventions performed:  SDOH Interventions   Flowsheet Row Most Recent Value  SDOH Interventions   Financial Strain Interventions Other (Comment)  [manufacturer assistance]      CCM Care Plan  No Known Allergies  Medications Reviewed Today    Reviewed by De Hollingshead, RPH-CPP (Pharmacist) on 02/17/20 at 1106  Med List Status: <None>  Medication Order Taking? Sig Documenting Provider Last Dose Status Informant  acetaminophen (TYLENOL) 650 MG CR tablet [id number] Yes Take 650 mg by mouth every 8 (eight) hours as needed for pain. [provider] Taking Active            Med Note Mayo Ao Feb 17, 2020 10:54 AM) Taking 650 mg BID  aspirin 81 MG tablet [id number] Yes Take 81 mg by mouth daily. [provider] Taking Active   calcium carbonate (OSCAL) 1500 (600 Ca) MG TABS tablet [id number] Yes Take 600 mg by mouth daily. [provider] Taking Active   cholecalciferol (VITAMIN D3) 25 MCG (1000 UNIT) tablet [id number] Yes Take 2,000 Units by mouth daily. [provider] Taking Active   colesevelam (WELCHOL) 625 MG tablet [id number] No Take 625 mg by mouth daily as  needed.  Patient not taking: Reported on 02/17/2020   [provider] Not Taking Active            Med Note Mayo Ao Feb 17, 2020 11:02 AM) Taking PRN diarrhea w/ nexium  cyanocobalamin (,VITAMIN B-12,) 1000 MCG/ML injection [id number] Yes INJECT 1 ML ONCE PER MONTH Burnard Hawthorne, FNP Taking Active   esomeprazole (NEXIUM) 40 MG capsule [id number] No Take by mouth.  Patient not taking: Reported on 02/17/2020   [provider] Not Taking Active            Med Note Dorna Leitz   Wed Apr 04, 2018  3:09 PM) PRN  fluticasone (FLONASE) 50 MCG/ACT nasal spray [id number] No Place 2 sprays into both  nostrils daily.  Patient not taking: Reported on 02/17/2020   Burnard Hawthorne, FNP Not Taking Active   magnesium oxide (MAG-OX) 400 MG tablet [id number] Yes Take 400 mg by mouth daily. [provider] Taking Active   metolazone (ZAROXOLYN) 5 MG tablet [id number] Yes Take 5 mg by mouth daily. [provider] Taking Active            Med Note Mayo Ao Feb 17, 2020 11:03 AM) Taking 2 tabs ~ twice weekly  ondansetron (ZOFRAN) 4 MG tablet [id number] Yes Take 1 tablet (4 mg total) by mouth every 8 (eight) hours as needed for nausea or vomiting. Marrian Salvage, FNP Taking Active   potassium chloride (KLOR-CON) 10 MEQ tablet [id number] Yes Take 1 tablet by mouth twice daily. **Take with Metolazone.[name], Danae Chen, DO Taking Active   rOPINIRole (REQUIP) 0.25 MG tablet [id number] Yes TAKE 1 TABLET(0.25 MG) BY MOUTH AT BEDTIME AS NEEDED Burnard Hawthorne, FNP Taking Active   traMADol (ULTRAM) 50 MG tablet [id number] Yes TAKE 1 TABLET BY MOUTH DAILY AS NEEDED Burnard Hawthorne, FNP Taking Active            Med Note Mayo Ao Feb 17, 2020 10:51 AM) Taking at least 2 daily          Patient Active Problem List   Diagnosis Date Noted  . Cough 01/01/2020  . Dyspnea 03/13/2019  . Osteoporosis 02/14/2019  . Weakness of both lower extremities 08/24/2018  . Greater trochanteric bursitis, left 04/24/2018  . Vitamin D deficiency 04/06/2018  . Trigger point of left shoulder region 12/26/2017  . Frozen shoulder 11/14/2017  . GERD (gastroesophageal reflux disease) 03/17/2017  . Greater trochanteric bursitis of right hip 09/07/2016  . Chronic midline low back pain without sciatica 04/25/2016  . Fatty liver disease, nonalcoholic 14/48/1856  . Nodule of left lung 11/25/2015  . Prediabetes 08/24/2015  . Muscle cramp 02/05/2015  . B12 deficiency 02/05/2015  . Cervical disc disorder with radiculopathy of cervical region 12/08/2014  . Trapezius muscle  spasm 11/20/2014  . Nonallopathic lesion of thoracic region 11/20/2014  . Trigger thumb 10/23/2014  . Pain of right thumb 10/07/2014  . Spinal stenosis, lumbar region, with neurogenic claudication 08/01/2014  . Hemorrhoid 06/18/2014  . Postmenopausal estrogen deficiency 03/18/2014  . Skin lesion 03/18/2014  . Severe obesity (BMI >= 40) ([place]) 02/14/2014  . Myalgia and myositis 02/14/2014  . Arthralgia 12/25/2012  . Pernicious anemia 12/25/2012  . Memory loss 12/25/2012  . Hyperlipidemia 01/25/2012    Conditions to be addressed/monitored: Weight, NAFLD, Osteoporosis  Care Plan : Medication Management  Updates made by De Hollingshead, RPH-CPP since 02/17/2020 12:00 AM  Problem: Weight Management, Pre-Diabetes, NAFLD, Osteoporosis     Long-Range Goal: Disease Progression Prevention   Recent Progress: On track  Priority: High  Note:   Current Barriers:  . Unable to independently afford treatment regimen . Unable to independently achieve control of weight   Pharmacist Clinical Goal(s):  Marland Kitchen Over the next 90 days, patient will verbalize ability to afford treatment regimen . Over the next 90 days, patient will achieve improvement in weight   Interventions: . 1:1 collaboration with Burnard Hawthorne, FNP regarding development and update of comprehensive plan of care as evidenced by provider attestation and co-signature . Inter-disciplinary care team collaboration (see longitudinal plan of care) . Comprehensive medication review performed; medication list updated in electronic medical record  Obesity: . Progress with [facility], but has not met goal 5% weight loss. Plan to establish on Ozempic therapy, order in route. Should arrive any day. Current treatment: reports she restarted metformin 500 mg daily while awaiting Ozempic therapy.  . Discussed continued focus on weight management, along with collaboration with Dr. [name]  Arthritis, spinal stenosis, hip pain,  cervicular radiculopathy . Uncontrolled per patient report. Notes hip, along with back and leg pain, have worsened in the past 6 months. Previously saw sports medicine Dr. [name] and received injections. Current treatment: tramadol 50 mg daily prescribed, but patient has been taking twice daily. Reports she would take more often if they didn't make her so "woozy". Also notes she needs a refill but that the pharmacy reported a PA was needed first. Completed today by RN . Notes previously was on meloxicam with no benefit, and years ago on celecoxib with benefit. She remembers a conversation about stopping celecoxib d/t ASCVD risk. Currently also taking acetaminophen 650 mg BID and ropinirole 0.25 mg QPM PRN for "cramps" . Reviewed GI notes, recommended max dose of APAP per Gerarda Gunther is 2000 mg daily. Discussed that patient can increase to TID to remain under 2000 mg daily.  . Encouraged to contact Dr. [name] for follow up appointment for management of hip arthritis. Per review, patient has contacted his office and has an appointment next week for evaluation.   ASCVD Risk Reduction: . No statin regimen at this time. 10 year ASCVD risk >7.5%. Previously on atorvastatin, but wished to discontinue d/t the associated risk of diabetes. Reports of joint/muscle pains at baseline in other visits.   . Antiplatelet regimen: aspirin 81 mg daily . Consider alternative moderate intensity statin therapy moving forward, also given benefit in fatty liver disease (ie rosuvastatin 5-10 mg daily) given 10 year ASCVD risk   Osteoporosis: . Current treatment: declined bisphosphonate. Calcium 600 mg + Vitamin D 2000 units daily . T score 01/2019: -2.9.  . Recommend to continue current regimen at this time. Strongly recommend further conversations in the future about reduction in risk of risk of hip and vertebral fractures with treatment.   GI concerns: Marland Kitchen Managed by [facility]; NAFLD w/o scarring/fibrosis. Improved  with continued weight loss.  Marland Kitchen PRN use of colesevelam (diarrhea), docusate (constipation), ondansetron (nausea), esomeprazole 40 mg (GERD). Infrequent use. Reports that she was prescribed colesevelam to treat diarrhea that is caused by esomeprazole. . Recommend to continue current PRN symptom regimen at this time, given infrequent use . Continue collaboration with GI  Edema w/ SOB: . Managed; current regimen: metolazone 5 mg + potassium 10 mg BID PRN swelling; patient notes that she takes metolazone at least once daily, reports needing a second dose of metolazone ~  2-3 times weekly o Previously on furosemide, did not report benefit. Changed to metolazone  . Underwent work up per cardiology with normal LVEF . Continues collaboration with pulmonary.  . Continue current regimen at this time along with collaboration with multidisciplinary team to fully evaluate causes of SOB and swellig  Patient Goals/Self-Care Activities . Over the next 90 days, patient will:  - collaborate with provider on medication access solutions target a minimum of 150 minutes of moderate intensity exercise weekly engage in dietary modifications by continuing to reduce saturated fats, reducing carbohydrate portions  Follow Up Plan: Telephone follow up appointment with care management team member scheduled for: ~ 4 weeks      Medication Assistance: Ozempic obtained through [employer] medication assistance program.  Enrollment ends 12/16/20  Follow Up:  Patient agrees to Care Plan and Follow-up.  Plan: Face to Face appointment with care management team member scheduled for: ~ 4 weeks  Catie Darnelle Maffucci, PharmD, [place], CPP Clinical Pharmacist [place] [facility] [place]  I have collaborated with the care management provider regarding care management and care coordination activities outlined in this encounter and have reviewed this encounter including documentation in the note and care plan. I  am certifying that I agree with the content of this note and encounter as primary care provider.   Mable Paris, NP

## 2020-02-17 NOTE — Telephone Encounter (Signed)
Patient wondering about status of PA on tramadol. Please advise

## 2020-02-17 NOTE — Patient Instructions (Signed)
Visit Information  PATIENT GOALS: Goals Addressed              This Visit's Progress     Patient Stated   .  Medication Monitoring (pt-stated)        Patient Goals/Self-Care Activities . Over the next 90 days, patient will:  - collaborate with provider on medication access solutions target a minimum of 150 minutes of moderate intensity exercise weekly engage in dietary modifications by continuing to reduce saturated fats, reducing carbohydrate portions         The patient verbalized understanding of instructions, educational materials, and care plan provided today and declined offer to receive copy of patient instructions, educational materials, and care plan.    Plan: Face to Face appointment with care management team member scheduled for: ~ 4 weeks  Catie Darnelle Maffucci, PharmD, [place], [place] Clinical Pharmacist [employer] [facility] [employer] [phone]

## 2020-02-18 ENCOUNTER — Telehealth: Payer: Self-pay | Admitting: Family

## 2020-02-18 NOTE — Telephone Encounter (Signed)
Pt called and states that she will discuss statin at her appt with PCP in March. Ascher Schroepfer,cma

## 2020-02-18 NOTE — Telephone Encounter (Signed)
Pt called and states that she will discuss statin at her appt with PCP in March

## 2020-02-18 NOTE — Telephone Encounter (Signed)
I called and LVM for the patient to call back, she will need a follow up with the provider to discuss a statin medication.  Kendel Pesnell,cma

## 2020-02-18 NOTE — Telephone Encounter (Signed)
Reviewed Catie note  I agree and recommend consideration for statin, low intensity rosuvastatin 5-10 mg daily Would she consider? We can discuss at follow up as well; please sch

## 2020-02-19 ENCOUNTER — Telehealth: Payer: Self-pay

## 2020-02-19 NOTE — Telephone Encounter (Signed)
patient has been notified about her Ozempic rx being delivered today all 5 boxes. She stated she will pick up today.

## 2020-02-21 ENCOUNTER — Telehealth: Payer: Self-pay | Admitting: Family

## 2020-02-21 NOTE — Telephone Encounter (Signed)
Pa was completed by me on 02/17/20 and per insurance Tramadol is a covered medication for  This medication . Tried to reach patient by phone to confirm she has tramadol no answer left voicemail to call office.

## 2020-02-21 NOTE — Telephone Encounter (Signed)
Not sure what to do Would ask pt if she has tramadol or needs a PA? I

## 2020-02-25 ENCOUNTER — Ambulatory Visit (INDEPENDENT_AMBULATORY_CARE_PROVIDER_SITE_OTHER): Payer: Medicare Other

## 2020-02-25 ENCOUNTER — Encounter: Payer: Self-pay | Admitting: Family Medicine

## 2020-02-25 ENCOUNTER — Ambulatory Visit: Payer: Medicare Other | Admitting: Family Medicine

## 2020-02-25 ENCOUNTER — Other Ambulatory Visit: Payer: Self-pay

## 2020-02-25 VITALS — BP 122/70 | HR 82 | Ht 61.0 in | Wt 222.0 lb

## 2020-02-25 DIAGNOSIS — M25551 Pain in right hip: Secondary | ICD-10-CM

## 2020-02-25 DIAGNOSIS — G8929 Other chronic pain: Secondary | ICD-10-CM

## 2020-02-25 DIAGNOSIS — M47816 Spondylosis without myelopathy or radiculopathy, lumbar region: Secondary | ICD-10-CM | POA: Diagnosis not present

## 2020-02-25 DIAGNOSIS — M545 Low back pain, unspecified: Secondary | ICD-10-CM

## 2020-02-25 DIAGNOSIS — M48062 Spinal stenosis, lumbar region with neurogenic claudication: Secondary | ICD-10-CM | POA: Diagnosis not present

## 2020-02-25 DIAGNOSIS — M25552 Pain in left hip: Secondary | ICD-10-CM

## 2020-02-25 DIAGNOSIS — M160 Bilateral primary osteoarthritis of hip: Secondary | ICD-10-CM | POA: Diagnosis not present

## 2020-02-25 MED ORDER — PREDNISONE 20 MG PO TABS: 20.0000 mg | ORAL_TABLET | Freq: Every day | ORAL | 0 refills | Status: DC

## 2020-02-25 MED ORDER — GABAPENTIN 100 MG PO CAPS: ORAL_CAPSULE | ORAL | 1 refills | Status: DC

## 2020-02-25 NOTE — Patient Instructions (Addendum)
Good to see you  Get x ray on your way out Prednisone 79m  Gapentin 2066mat night Ice 20 minutes 2 times daily. Usually after activity and before bed. If pain worsens call me or seek medical attention immediately  Send message in 2 weeks  If pain is worsening will consider an MRI See me again in 4 weeks

## 2020-02-25 NOTE — Assessment & Plan Note (Signed)
Patient has had difficulty with spinal stenosis previously.  I believe the patient is having an exacerbation.  Given a low dose of gabapentin that I am hoping will be beneficial.  Warned of potential side effects.  We discussed prednisone for short course.  Patient will hold on all the other anti-inflammatories.  Patient will watch for any type of swelling, patient denies any type of abdominal pain, bowel or bladder changes, or any type of lower extremity weakness.  If worsening pain I do think that advanced imaging is warranted such as an MRI but will repeat x-rays today.

## 2020-02-25 NOTE — Progress Notes (Signed)
[facility] Phone: [phone] Subjective:   Morgan Hurst, am serving as a scribe for Dr. Hulan Saas.  This visit occurred during the SARS-CoV-2 public health emergency.  Safety protocols were in place, including screening questions prior to the visit, additional usage of staff PPE, and extensive cleaning of exam room while observing appropriate contact time as indicated for disinfecting solutions.    I'm seeing this patient by the request  of:  Burnard Hawthorne, FNP  CC: back and hip pain   ASN:[id number]   09/28/2018 Patient continues to have shortness of breath with increasing activity.  Patient has had a lung nodule previously and last CT of the chest without contrast was done in 2017.  We discussed repeating this but patient would like to hold and talk to her cardiologist.  She will discuss with them further about further work-up.  We discussed not changing any medications at this time because she is making improvement, worsening shortness of breath or chest pain to seek medical attention immediately.  Patient understands this.  Follow-up with me again potentially virtually or on as a phone call in the next 4 weeks.  Update 02/25/2020 Morgan Hurst is a 75 y.o. female coming in with complaint of back and hip pain. Patient states that she thinks her arthritis has gotten so bad that she is in constant pain. Her lower back and hips are the worst. Patient was taking meloxicam for awhile, was switched to tramadol, then tried tylenol arthritis. Eventually all those medications stopped helping. Patient is having trouble bending down to pick things up, getting harder to go up steps and was told to see sports medicine to help. Patient states that her shortness of breath is significantly improved at the moment.   Xray lumbar 09/07/2018 FINDINGS: Frontal, lateral, spot lumbosacral lateral, and bilateral oblique views  were obtained. There are 6 non-rib-bearing lumbar type vertebral bodies. There is no fracture or spondylolisthesis. There is mild disc space narrowing at L3-4, L4-5, and L5-6. There is facet osteoarthritic change at L3-4, L4-5, L5-6, and L6-S1 bilaterally. No erosive changes.  IMPRESSION: Osteoarthritic change at multiple levels. No fracture or spondylolisthesis.    Past Medical History:  Diagnosis Date  . Arthritis    lower back, right hip  . B12 deficiency   . Back pain   . Borderline diabetes    PCP STARTED PT ON METFORMIN IN 2016 DUE TO ELEVATED GLUCOSE   . Complication of anesthesia    FIGHTING WHEN WAKING UP FROM ANESTHESIA  . Constipation   . Diabetes mellitus without complication ([place])   . Endometriosis   . Fatty liver   . Gallbladder problem   . GERD (gastroesophageal reflux disease)   . Heart murmur   . High cholesterol   . Hip pain   . Joint pain   . Kidney stones   . Leg weakness   . Orthodontics    top front 3 teeth caps and bridge  . Prediabetes   . RA (rheumatoid arthritis) ([place])   . SOB (shortness of breath)   . Stomach ulcer   . Swelling   . Vitamin D deficiency    Past Surgical History:  Procedure Laterality Date  . ABDOMINAL HYSTERECTOMY  age 48  . ANKLE SURGERY    . CHOLECYSTECTOMY N/A 04/10/2015   Procedure: LAPAROSCOPIC CHOLECYSTECTOMY WITH INTRAOPERATIVE CHOLANGIOGRAM;  Surgeon: Robert Bellow, MD;  Location: [facility] ORS;  Service: General;  Laterality: N/A;  . COLONOSCOPY WITH PROPOFOL N/A 07/10/2017   Procedure: COLONOSCOPY WITH PROPOFOL;  Surgeon: Jonathon Bellows, MD;  Location[facility] ENDOSCOPY;  Service: Gastroenterology;  Laterality: N/A;  . ESOPHAGOGASTRODUODENOSCOPY N/A 05/12/2015   Procedure: ESOPHAGOGASTRODUODENOSCOPY (EGD);  Surgeon: Hulen Luster, MD;  Location: [facility];  Service: Gastroenterology;  Laterality: N/A;  . VAGINAL DELIVERY  2   Social History   Socioeconomic History  . Marital status: Widowed    Spouse name: Not  on file  . Number of children: 2  . Years of education: Not on file  . Highest education level: Not on file  Occupational History  . Occupation: Retired  Tobacco Use  . Smoking status: Never Smoker  . Smokeless tobacco: Never Used  Vaping Use  . Vaping Use: Never used  Substance and Sexual Activity  . Alcohol use: No  . Drug use: No  . Sexual activity: Not Currently  Other Topics Concern  . Not on file  Social History Narrative   Lives in [place].       Husband passed away 01-Apr-2014 from PNA.      Work - retired from [employer] - regular; working on [employer]   Exercise - walks occasionally, limited by fatigue. [place] and takes care of her 53 year old grandchild.    Social Determinants of Health   Financial Resource Strain: Medium Risk  . Difficulty of Paying Living Expenses: Somewhat hard  Food Insecurity: Not on file  Transportation Needs: No Transportation Needs  . Lack of Transportation (Medical): No  . Lack of Transportation (Non-Medical): No  Physical Activity: Not on file  Stress: No Stress Concern Present  . Feeling of Stress : Not at all  Social Connections: Moderately Integrated  . Frequency of Communication with Friends and Family: More than three times a week  . Frequency of Social Gatherings with Friends and Family: More than three times a week  . Attends Religious Services: 1 to 4 times per year  . Active Member of Clubs or Organizations: Yes  . Attends Archivist Meetings: 1 to 4 times per year  . Marital Status: Widowed   No Known Allergies Family History  Problem Relation Age of Onset  . Diabetes Mother   . Cancer Mother        colon and breast  . Breast cancer Mother   . Liver disease Mother   . Cancer Father        Leukemia  . Cancer Brother        colon and lung  . Cholelithiasis Daughter   . Heart disease Paternal Grandfather   . Cancer Other 29       colon  . Thyroid cancer Neg Hx     Current Outpatient  Medications (Endocrine & Metabolic):  .  metFORMIN (GLUCOPHAGE) 500 MG tablet, Take 500 mg by mouth daily. .  predniSONE (DELTASONE) 20 MG tablet, Take 1 tablet (20 mg total) by mouth daily with breakfast.  Current Outpatient Medications (Cardiovascular):  .  colesevelam (WELCHOL) 625 MG tablet, Take 625 mg by mouth daily as needed. .  metolazone (ZAROXOLYN) 5 MG tablet, Take 5 mg by mouth daily.  Current Outpatient Medications (Respiratory):  .  fluticasone (FLONASE) 50 MCG/ACT nasal spray, Place 2 sprays into both nostrils daily.  Current Outpatient Medications (Analgesics):  .  acetaminophen (TYLENOL) 650 MG CR tablet, Take 650 mg by mouth every 8 (eight) hours as needed for  pain. .  aspirin 81 MG tablet, Take 81 mg by mouth daily. .  traMADol (ULTRAM) 50 MG tablet, TAKE 1 TABLET BY MOUTH DAILY AS NEEDED  Current Outpatient Medications (Hematological):  .  cyanocobalamin (,VITAMIN B-12,) 1000 MCG/ML injection, INJECT 1 ML ONCE PER MONTH  Current Outpatient Medications (Other):  .  calcium carbonate (OSCAL) 1500 (600 Ca) MG TABS tablet, Take 600 mg by mouth daily. .  cholecalciferol (VITAMIN D3) 25 MCG (1000 UNIT) tablet, Take 2,000 Units by mouth daily. Marland Kitchen  esomeprazole (NEXIUM) 40 MG capsule, Take by mouth. .  gabapentin (NEURONTIN) 100 MG capsule, Take two capsules (279m total) daily at bedtime .  magnesium oxide (MAG-OX) 400 MG tablet, Take 400 mg by mouth daily. .  ondansetron (ZOFRAN) 4 MG tablet, Take 1 tablet (4 mg total) by mouth every 8 (eight) hours as needed for nausea or vomiting. .  potassium chloride (KLOR-CON) 10 MEQ tablet, Take 1 tablet by mouth twice daily. **Take with Metolazone.** .  rOPINIRole (REQUIP) 0.25 MG tablet, TAKE 1 TABLET(0.25 MG) BY MOUTH AT BEDTIME AS NEEDED   Reviewed prior external information including notes and imaging from  primary care provider As well as notes that were available from care everywhere and other healthcare systems.  Past  medical history, social, surgical and family history all reviewed in electronic medical record.  No pertanent information unless stated regarding to the chief complaint.   Review of Systems:  No headache, visual changes, nausea, vomiting, diarrhea, constipation, dizziness, abdominal pain, skin rash, fevers, chills, night sweats, weight loss, swollen lymph nodes, joint swelling, chest pain, shortness of breath, mood changes. POSITIVE muscle aches, body aches  Objective  Blood pressure 122/70, pulse 82, height 5' 1"  (1.549 m), weight 222 lb (100.7 kg), SpO2 97 %.   General: No apparent distress alert and oriented x3 mood and affect normal, dressed appropriately.  Overweight HEENT: Pupils equal, extraocular movements intact  Respiratory: Patient's speak in full sentences and does not appear short of breath  Cardiovascular: No lower extremity edema, non tender, no erythema  Gait normal with good balance and coordination.  MSK: Lower back does have some loss of lordosis.  Tenderness to palpation mostly of the lower lumbar spine and L5-S1.  Tightness with straight leg test.  Worsening pain with extension of the back.  4+ out of 5 strength of the lower extremity but symmetric.    Impression and Recommendations:     The above documentation has been reviewed and is accurate and complete ZLyndal Pulley DO

## 2020-02-26 DIAGNOSIS — Z03818 Encounter for observation for suspected exposure to other biological agents ruled out: Secondary | ICD-10-CM | POA: Diagnosis not present

## 2020-02-26 DIAGNOSIS — Z79899 Other long term (current) drug therapy: Secondary | ICD-10-CM | POA: Diagnosis not present

## 2020-02-26 DIAGNOSIS — K7581 Nonalcoholic steatohepatitis (NASH): Secondary | ICD-10-CM | POA: Diagnosis not present

## 2020-02-26 DIAGNOSIS — Z01818 Encounter for other preprocedural examination: Secondary | ICD-10-CM | POA: Diagnosis not present

## 2020-02-26 DIAGNOSIS — R0602 Shortness of breath: Secondary | ICD-10-CM | POA: Diagnosis not present

## 2020-02-27 ENCOUNTER — Ambulatory Visit: Payer: Medicare Other | Admitting: Internal Medicine

## 2020-03-03 ENCOUNTER — Other Ambulatory Visit: Payer: Medicare Other

## 2020-03-04 ENCOUNTER — Other Ambulatory Visit: Payer: Self-pay

## 2020-03-04 ENCOUNTER — Ambulatory Visit (INDEPENDENT_AMBULATORY_CARE_PROVIDER_SITE_OTHER): Payer: Medicare Other | Admitting: Family Medicine

## 2020-03-04 ENCOUNTER — Encounter (INDEPENDENT_AMBULATORY_CARE_PROVIDER_SITE_OTHER): Payer: Self-pay | Admitting: Family Medicine

## 2020-03-04 VITALS — BP 128/76 | HR 68 | Temp 97.7°F | Ht 61.0 in | Wt 217.0 lb

## 2020-03-04 DIAGNOSIS — M1990 Unspecified osteoarthritis, unspecified site: Secondary | ICD-10-CM | POA: Diagnosis not present

## 2020-03-04 DIAGNOSIS — Z6841 Body Mass Index (BMI) 40.0 and over, adult: Secondary | ICD-10-CM

## 2020-03-04 DIAGNOSIS — R609 Edema, unspecified: Secondary | ICD-10-CM

## 2020-03-04 DIAGNOSIS — R7303 Prediabetes: Secondary | ICD-10-CM | POA: Diagnosis not present

## 2020-03-04 DIAGNOSIS — R600 Localized edema: Secondary | ICD-10-CM | POA: Diagnosis not present

## 2020-03-04 MED ORDER — POTASSIUM CHLORIDE CRYS ER 10 MEQ PO TBCR: EXTENDED_RELEASE_TABLET | ORAL | 0 refills | Status: DC

## 2020-03-09 NOTE — Progress Notes (Signed)
Chief Complaint:   OBESITY Morgan Hurst is here to discuss her progress with her obesity treatment plan along with follow-up of her obesity related diagnoses.   Today's visit was #: 11 Starting weight: 213 lbs Starting date: 06/30/2019 Today's weight: 217 lbs Today's date: 03/05/2019 Total lbs lost to date: +4 lbs Body mass index is 41 kg/m.   Interim History: Morgan Hurst says her osteoarthritis is decreasing her walking but is improving. She finally got her GLP1RA, third shot tomorrow. She has increased edema, prescription for Torsemide 20 mg was provided recently.  Nutrition Plan:  keeping a food journal and adhering to recommended goals of 1200 calories and 95 grams of protein 20-30% of the time.. Anti-obesity medications: Ozempic. Reported side effects: None. Activity: Walking for 5-20 minutes 2-4 times per week  Assessment/Plan:   1. Lower extremity edema Worsening. Recommendations: decrease sodium in the diet, elevate feet above the level of the heart whenever possible, increase physical activity, and use of compression stockings. Medication: Torsemide and Klor-Con. Warned Morgan Hurst that she may need a higher dosage of potassium.  - Refill potassium chloride (KLOR-CON) 10 MEQ tablet; Take 1 tablet by mouth twice daily. **Take with Metolazone.**  Dispense: 180 tablet; Refill: 0  2. Other type of osteoarthritis Pain improving, continue walking. We will continue to monitor symptoms as they relate to her weight loss journey.  3. Prediabetes with Polyphagia At goal. Goal is HgbA1c < 5.7.  Medication: Ozempic through patient assistance.  Plan:  She will continue to focus on protein-rich, low simple carbohydrate foods. We reviewed the importance of hydration, regular exercise for stress reduction, and restorative sleep.   Lab Results  Component Value Date   HGBA1C 5.2 09/25/2019   Lab Results  Component Value Date   INSULIN 20.9 06/20/2019    4. Class 3 severe obesity with serious  comorbidity and body mass index (BMI) of 40.0 to 44.9 in adult, unspecified obesity type (Morgan Hurst) Course: Morgan Hurst is currently in the action stage of change. As such, her goal is to continue with weight loss efforts.   Nutrition goals: She has agreed to keeping a food journal and adhering to recommended goals of 1200 calories and 95 grams of protein.   Exercise goals: As is.  Behavioral modification strategies: increasing lean protein intake, decreasing simple carbohydrates, increasing vegetables and increasing water intake.  Morgan Hurst has agreed to follow-up with our clinic in 4 weeks. She was informed of the importance of frequent follow-up visits to maximize her success with intensive lifestyle modifications for her multiple health conditions.   Objective:   Blood pressure 128/76, pulse 68, temperature 97.7 F (36.5 C), temperature source Oral, height 5' 1"  (1.549 m), weight 217 lb (98.4 kg), SpO2 97 %. Body mass index is 41 kg/m.  General: Cooperative, alert, well developed, in no acute distress. HEENT: Conjunctivae and lids unremarkable. Cardiovascular: Regular rhythm.  Lungs: Normal work of breathing. Neurologic: No focal deficits.   Lab Results  Component Value Date   CREATININE 0.64 11/13/2019   BUN 15 11/13/2019   NA 142 11/13/2019   K 3.9 11/13/2019   CL 103 11/13/2019   CO2 24 11/13/2019   Lab Results  Component Value Date   ALT 14 11/13/2019   AST 19 11/13/2019   ALKPHOS 84 11/13/2019   BILITOT 0.4 11/13/2019   Lab Results  Component Value Date   HGBA1C 5.2 09/25/2019   HGBA1C 5.9 05/15/2019   HGBA1C 6.0 09/04/2018   HGBA1C 5.9 12/25/2017   HGBA1C  6.0 04/11/2017   Lab Results  Component Value Date   INSULIN 20.9 06/20/2019   Lab Results  Component Value Date   TSH 3.060 06/20/2019   Lab Results  Component Value Date   CHOL 191 05/15/2019   HDL 45.60 05/15/2019   LDLCALC 118 (H) 05/15/2019   LDLDIRECT 177.7 12/25/2012   TRIG 137.0 05/15/2019   CHOLHDL  4 05/15/2019   Lab Results  Component Value Date   WBC 7.8 06/20/2019   HGB 14.9 06/20/2019   HCT 44.5 06/20/2019   MCV 89 06/20/2019   PLT 189 06/20/2019   Lab Results  Component Value Date   IRON 59 09/07/2018   FERRITIN 87.1 09/07/2018    Obesity Behavioral Intervention:   Approximately 15 minutes were spent on the discussion below.  ASK: We discussed the diagnosis of obesity with Morgan Hurst today and Morgan Hurst agreed to give [place] permission to discuss obesity behavioral modification therapy today.  ASSESS: Morgan Hurst has the diagnosis of obesity and her BMI today is 41.2. Morgan Hurst is in the action stage of change.   ADVISE: Morgan Hurst was educated on the multiple health risks of obesity as well as the benefit of weight loss to improve her health. She was advised of the need for long term treatment and the importance of lifestyle modifications to improve her current health and to decrease her risk of future health problems.  AGREE: Multiple dietary modification options and treatment options were discussed and Morgan Hurst agreed to follow the recommendations documented in the above note.  ARRANGE: Morgan Hurst was educated on the importance of frequent visits to treat obesity as outlined per CMS and USPSTF guidelines and agreed to schedule her next follow up appointment today.  Attestation Statements:   Reviewed by clinician on day of visit: allergies, medications, problem list, medical history, surgical history, family history, social history, and previous encounter notes.  Morgan Hurst, CMA, am acting as Location manager for [employer], DO.  I have reviewed the above documentation for accuracy and completeness, and I agree with the above. Briscoe Deutscher, DO

## 2020-03-10 ENCOUNTER — Telehealth: Payer: Self-pay

## 2020-03-10 NOTE — Telephone Encounter (Signed)
Patient stated she was to call back and let us know how her medication was doing. Prednisone has helped with the arthritis pain, but still has some pain in the R leg that makes it hard to walk especially when she gets the shock of pain. Patient has finished her medication and is wanting to know where to go from here.

## 2020-03-12 NOTE — Telephone Encounter (Signed)
I would maybe give it the weekend.  If worsenign pain though over the weekend would need to seek medical attention  Otherwise on Monday send a message and if worse or no better then we need MRI of the lumbar

## 2020-03-12 NOTE — Telephone Encounter (Signed)
Left message for patient with plan.

## 2020-03-16 ENCOUNTER — Ambulatory Visit (INDEPENDENT_AMBULATORY_CARE_PROVIDER_SITE_OTHER): Payer: Medicare Other | Admitting: Pharmacist

## 2020-03-16 DIAGNOSIS — R600 Localized edema: Secondary | ICD-10-CM

## 2020-03-16 DIAGNOSIS — M48062 Spinal stenosis, lumbar region with neurogenic claudication: Secondary | ICD-10-CM

## 2020-03-16 DIAGNOSIS — E785 Hyperlipidemia, unspecified: Secondary | ICD-10-CM | POA: Diagnosis not present

## 2020-03-16 DIAGNOSIS — K760 Fatty (change of) liver, not elsewhere classified: Secondary | ICD-10-CM

## 2020-03-16 DIAGNOSIS — M818 Other osteoporosis without current pathological fracture: Secondary | ICD-10-CM | POA: Diagnosis not present

## 2020-03-16 DIAGNOSIS — R7303 Prediabetes: Secondary | ICD-10-CM

## 2020-03-16 NOTE — Chronic Care Management (AMB) (Addendum)
Chronic Care Management Pharmacy Note  03/16/2020 Name:  Morgan Hurst MRN:  [id number] DOB:  Jan 08, 1946  Subjective: Morgan Hurst is an 75 y.o. year old female who is a primary patient of Burnard Hawthorne, FNP.  The CCM team was consulted for assistance with disease management and care coordination needs.    Engaged with patient by telephone for follow up visit in response to provider referral for pharmacy case management and/or care coordination services.   Consent to Services:  The patient was given information about Chronic Care Management services, agreed to services, and gave verbal consent prior to initiation of services.  Please see initial visit note for detailed documentation.   Patient Care Team: Burnard Hawthorne, FNP as PCP - General (Family Medicine) Jackolyn Confer, MD (Internal Medicine) Bary Castilla Forest Gleason, MD (General Surgery) De Hollingshead, RPH-CPP (Pharmacist)  Recent office visits: None since our last appointment  Recent consult visits:  2/8 - orthopedic Dr. [name]; xray, script for prednisone, gabapenin 200 mg QPM, ice BID  2/9 - pulmonary Dr. Montel Culver, PFT performed FEV1 1.59, FVC 2; post 1.72, 2.11; script for torsemide for LEE; recommendation to lose weight;  2/16 - weight management Dr. [name], discussed LEE and torsemide; keep food journal, 1200 cal and 95 g protein  Hospital visits: None in previous 6 months  Objective:  Lab Results  Component Value Date   CREATININE 0.64 11/13/2019   BUN 15 11/13/2019   GFR 98.01 05/15/2019   GFRNONAA 89 11/13/2019   GFRAA 102 11/13/2019   NA 142 11/13/2019   K 3.9 11/13/2019   CALCIUM 9.5 11/13/2019   CO2 24 11/13/2019    Lab Results  Component Value Date/Time   HGBA1C 5.2 09/25/2019 12:22 PM   HGBA1C 5.9 05/15/2019 12:22 PM   HGBA1C 6.0 09/04/2018 08:47 AM   GFR 98.01 05/15/2019 12:22 PM   GFR 98.20 09/07/2018 09:55 AM   MICROALBUR 2.3 (H) 01/01/2020 12:21 PM   MICROALBUR 1.8  09/04/2018 08:47 AM    Last diabetic Eye exam: No results found for: HMDIABEYEEXA  Last diabetic Foot exam: No results found for: HMDIABFOOTEX   Lab Results  Component Value Date   CHOL 191 05/15/2019   HDL 45.60 05/15/2019   LDLCALC 118 (H) 05/15/2019   LDLDIRECT 177.7 12/25/2012   TRIG 137.0 05/15/2019   CHOLHDL 4 05/15/2019    Hepatic Function Latest Ref Rng & Units 11/13/2019 06/20/2019 05/15/2019  Total Protein 6.0 - 8.5 g/dL 7.4 7.2 7.5  Albumin 3.7 - 4.7 g/dL 4.0 4.2 4.1  AST 0 - 40 IU/L 19 34 31  ALT 0 - 32 IU/L 14 28 28   Alk Phosphatase 44 - 121 IU/L 84 81 72  Total Bilirubin 0.0 - 1.2 mg/dL 0.4 0.5 0.5    Lab Results  Component Value Date/Time   TSH 3.060 06/20/2019 03:42 PM   TSH 3.26 12/25/2017 04:43 PM   FREET4 1.01 06/20/2019 03:42 PM    CBC Latest Ref Rng & Units 06/20/2019 09/04/2018 02/07/2018  WBC 3.4 - 10.8 x10E3/uL 7.8 8.4 7.5  Hemoglobin 11.1 - 15.9 g/dL 14.9 14.3 14.5  Hematocrit 34.0 - 46.6 % 44.5 43.6 43.7  Platelets 150 - 450 x10E3/uL 189 188.0 176.0    Lab Results  Component Value Date/Time   VD25OH 35.49 09/04/2018 08:47 AM   VD25OH 27.53 (L) 04/02/2018 08:17 AM    Clinical ASCVD: No  The 10-year ASCVD risk score Mikey Bussing DC Jr., et al., 2013) is: 23.8%  Values used to calculate the score:     Age: 65 years     Sex: Female     Is Non-Hispanic African American: No     Diabetic: Yes     Tobacco smoker: No     Systolic Blood Pressure: 914 mmHg     Is BP treated: No     HDL Cholesterol: 45.6 mg/dL     Total Cholesterol: 191 mg/dL    Depression screen [facility] 2/9 08/27/2019 06/20/2019 05/15/2019  Decreased Interest 0 1 0  Down, Depressed, Hopeless 0 1 0  PHQ - 2 Score 0 2 0  Altered sleeping - 0 0  Tired, decreased energy - 0 1  Change in appetite - 0 0  Feeling bad or failure about yourself  - 0 0  Trouble concentrating - 1 0  Moving slowly or fidgety/restless - 0 0  Suicidal thoughts - 0 0  PHQ-9 Score - 3 1  Difficult doing work/chores -  Not difficult at all Not difficult at all     PFT (02/25/2020 [facility]mon[facility]) SPIROMETRY: FVC was 2.00 liters, 79% of predicted/Post 2.11, 83%, 5% Change FEV1 was 1.59, 80% of predicted/Post 1.72, 87%, 8% Change FEV1 ratio was 79/Post 82 FEF 25-75% liters per second was 86% of predicted/Post 103%, 19% Change *SMALL VOLUME NEBULIZER given with 2.5 mg Albuterol for Post Spirometry  LUNG VOLUMES: TLC was 86% of predicted RV was 81% of predicted  DIFFUSION CAPACITY: DLCO was 79% of predicted DLCO/VA was 157% of predicted  FLOW VOLUME LOOP:  Scooping of expiratory limb of flow volume loop suggestive of obstructive physiology.  Impression Mild isolated decrement in DLCO with preserved spirometry and lung volumes.   Findings are due to obese body habitus.   Recommendation to lose 60lbs.     Social History   Tobacco Use  Smoking Status Never Smoker  Smokeless Tobacco Never Used   BP Readings from Last 3 Encounters:  03/04/20 128/76  02/25/20 122/70  01/01/20 112/74   Pulse Readings from Last 3 Encounters:  03/04/20 68  02/25/20 82  01/01/20 85   Wt Readings from Last 3 Encounters:  03/04/20 217 lb (98.4 kg)  02/25/20 222 lb (100.7 kg)  01/01/20 204 lb (92.5 kg)    Assessment/Interventions: Review of patient past medical history, allergies, medications, health status, including review of consultants reports, laboratory and other test data, was performed as part of comprehensive evaluation and provision of chronic care management services.   SDOH:  (Social Determinants of Health) assessments and interventions performed: Yes SDOH Interventions   Flowsheet Row Most Recent Value  SDOH Interventions   Financial Strain Interventions Other (Comment)  [manufacturer assistance]      CCM Care Plan  No Known Allergies  Medications Reviewed Today    Reviewed by De Hollingshead, RPH-CPP (Pharmacist) on 03/16/20 at 1047  Med List Status: <None>   Medication Order Taking? Sig Documenting Provider Last Dose Status Informant  acetaminophen (TYLENOL) 650 MG CR tablet [id number] Yes Take 650 mg by mouth every 8 (eight) hours as needed for pain. [provider] Taking Active            Med Note Mayo Ao Feb 17, 2020 10:54 AM) Taking 650 mg BID  aspirin 81 MG tablet [id number] Yes Take 81 mg by mouth daily. [provider] Taking Active   calcium carbonate (OSCAL) 1500 (600 Ca) MG TABS tablet [id number] Yes Take 600 mg by mouth daily. [provider] Taking Active   cholecalciferol (VITAMIN D3) 25 MCG (1000 UNIT) tablet [id number] Yes Take 2,000 Units by mouth daily. [provider] Taking Active   colesevelam [facility]) 625 MG tablet [id number] Yes Take 625 mg by mouth daily as needed. [provider] Taking Active            Med Note Mayo Ao Feb 17, 2020 11:02 AM) Taking PRN diarrhea w/ nexium  cyanocobalamin (,VITAMIN B-12,) 1000 MCG/ML injection [id number] Yes INJECT 1 ML ONCE PER MONTH Burnard Hawthorne, FNP Taking Active   esomeprazole (NEXIUM) 40 MG capsule [id number] Yes Take by mouth. [provider] Taking Active            Med Note Romeo Rabon Apr 04, 2018  3:09 PM) PRN  gabapentin (NEURONTIN) 100 MG capsule [id number] Yes Take two capsules (283m total) daily at bedtime SLyndal Pulley DO Taking Active            Med Note (Mayo AoFeb 28, 2022 10:38 AM) Taking 1 QPM  magnesium oxide (MAG-OX) 400 MG tablet [id number] Take 400 mg by mouth daily. [provider] Taking Active   metolazone (ZAROXOLYN) 5 MG tablet [id number] Take 5 mg by mouth daily.  Patient not taking: Reported on 03/16/2020   [provider] Not Taking Active            Med Note (Mayo AoFeb 28, 2022 10:41 AM)    ondansetron (ZOFRAN) 4 MG tablet [id number] Take 1 tablet (4 mg total) by mouth every 8 (eight)  hours as needed for nausea or vomiting.  Patient not taking: Reported on 03/16/2020   MMarrian Salvage FNP Not Taking Active   potassium chloride (KLOR-CON) 10 MEQ tablet [id number] Take 1 tablet by mouth twice daily. **Take with Metolazone.*Briscoe Deutscher DO Taking Active            Med Note (Mayo AoFeb 28, 2022 10:40 AM) 1 daily   rOPINIRole (REQUIP) 0.25 MG tablet [id number] TAKE 1 TABLET(0.25 MG) BY MOUTH AT BEDTIME AS NEEDED ABurnard Hawthorne FNP Taking Active   Semaglutide,0.25 or 0.5MG/DOS, (OZEMPIC, 0.25 OR 0.5 MG/DOSE,) 2 MG/1.5ML SOPN [id number] Inject 0.25 mg into the skin once a week. [provider] Taking Active   torsemide (DEMADEX) 20 MG tablet [id number] Take 20 mg by mouth daily as needed. [provider] Taking Active            Med Note (Darnelle Maffucci CWaynette ButteryFeb 28, 2022 10:40 AM) Taking most days, skipping when she has somewhere to go; about every other day  traMADol (ULTRAM) 50 MG tablet [id number] TAKE 1 TABLET BY MOUTH DAILY AS NEEDED ABurnard Hawthorne FNP Taking Active            Med Note (Mayo AoFeb 28, 2022 10:43 AM) Taking 1/2 -1 daily          Patient Active Problem List   Diagnosis Date Noted  . Cough 01/01/2020  . Dyspnea 03/13/2019  . Osteoporosis 02/14/2019  . Weakness of both lower extremities 08/24/2018  . Greater trochanteric bursitis, left 04/24/2018  . Vitamin D deficiency 04/06/2018  . Trigger point of left shoulder region 12/26/2017  . Frozen shoulder 11/14/2017  . GERD (gastroesophageal reflux disease) 03/17/2017  . Greater trochanteric  bursitis of right hip 09/07/2016  . Chronic midline low back pain without sciatica 04/25/2016  . Fatty liver disease, nonalcoholic 63/14/9702  . Nodule of left lung 11/25/2015  . Prediabetes 08/24/2015  . Muscle cramp 02/05/2015  . B12 deficiency 02/05/2015  . Cervical disc disorder with radiculopathy of cervical region  12/08/2014  . Trapezius muscle spasm 11/20/2014  . Nonallopathic lesion of thoracic region 11/20/2014  . Trigger thumb 10/23/2014  . Pain of right thumb 10/07/2014  . Spinal stenosis, lumbar region, with neurogenic claudication 08/01/2014  . Hemorrhoid 06/18/2014  . Postmenopausal estrogen deficiency 03/18/2014  . Skin lesion 03/18/2014  . Severe obesity (BMI >= 40) ([place]) 02/14/2014  . Myalgia and myositis 02/14/2014  . Arthralgia 12/25/2012  . Pernicious anemia 12/25/2012  . Memory loss 12/25/2012  . Hyperlipidemia 01/25/2012    Immunization History  Administered Date(s) Administered  . Fluad Quad(high Dose 65+) 01/01/2020  . Hepb-cpg 09/05/2019, 09/25/2019  . Influenza Split 12/26/2011  . Influenza, High Dose Seasonal PF 11/25/2015, 10/13/2016, 09/26/2017, 11/30/2018  . Influenza,inj,Quad PF,6+ Mos 12/25/2012, 03/18/2014, 10/07/2014  . Pneumococcal Conjugate-13 03/18/2014  . Pneumococcal Polysaccharide-23 12/26/2011  . Tdap 12/25/2009  . Zoster 12/26/2010    Conditions to be addressed/monitored:  Hypertension, Hyperlipidemia, COPD and Osteoporosis  Care Plan : Medication Management  Updates made by De Hollingshead, RPH-CPP since 03/16/2020 12:00 AM    Problem: Weight Management, Pre-Diabetes, NAFLD, Osteoporosis     Long-Range Goal: Disease Progression Prevention   This Visit's Progress: On track  Recent Progress: On track  Priority: High  Note:   Current Barriers:  . Unable to independently afford treatment regimen . Unable to independently achieve control of weight   Pharmacist Clinical Goal(s):  Marland Kitchen Over the next 90 days, patient will verbalize ability to afford treatment regimen . Over the next 90 days, patient will achieve improvement in weight   Interventions: . 1:1 collaboration with Burnard Hawthorne, FNP regarding development and update of comprehensive plan of care as evidenced by provider attestation and co-signature . Inter-disciplinary care team  collaboration (see longitudinal plan of care) . Comprehensive medication review performed; medication list updated in electronic medical record  Obesity, Diabetes, Impaired Fasting Glucose: Marland Kitchen Diabetes at goal, but unable to achieve goal weight loss of 5-10% with diet and exercise alone; current treatment: Ozempic 0.25 mg x 3 weeks . Appreciates receiving from patient assistance. Denies any GI upset since starting, but denies any perception of benefit on appetite or weight either . Baseline weight:  . Pulmonology recently performed PFT, changes related to body habitus, recommended a total weight loss of 60 lbs.  . Working with [facility] Dr. [name]; goal of 1200 cal daily and 95 g protein . Reviewed to continue Ozempic 0.25 mg for a total of 4 weeks then increase to 0.5 mg weekly. Reviewed that 1 mg dose will be an option in the future. Patient verbalized understanding . Encouraged continued collaboration with Dr. [name]  Arthritis, spinal stenosis, hip pain, cervicular radiculopathy . Uncontrolled per patient report. Some relief with prednisone course and addition of gabapentin per Dr. [name], but not resolved. Has been communicating with their office and suggestion was for MRI if pain did not improve over the weekend Current treatment: tramadol 50 mg daily, 1/2-1 tab daily; APAP 650 mg BID, gabapentin 200 mg QPM - though using 100 mg QPM every evening. No mention of increased sedation. . Notes previously was on meloxicam with no benefit, and years ago on celecoxib with benefit. She  notes that she discussed restarting celecoxib with Dr. [name] . Notes that she plans to contact PCP for medication suggestions. Reviewed that Dr. [name] is managing the orthopedic side of her health, and that concerns about increased pain should go to him. Reviewed documentation of his instruction that she needed to contact his office to request MRI. She was confused as to why an MRI was needed. Reviewed that  more imaging may help better determine treatment moving forward. She notes she will call their office back tomorrow.  . Reviewed that she can take up to 200 mg QPM of gabapentin if pain is not controlled with 100 mg.  . Reviewed upcoming appointment with Dr. [name] next week.  ASCVD Risk Reduction: . No statin regimen at this time. 10 year ASCVD risk >7.5%. Previously on atorvastatin, but wished to discontinue d/t the associated risk of diabetes. Reports of joint/muscle pains at baseline in other visits.   . Antiplatelet regimen: aspirin 81 mg daily . Consider alternative moderate intensity statin therapy moving forward, also given benefit in fatty liver disease (ie rosuvastatin 5-10 mg daily) given 10 year ASCVD risk. Consider rechecking lipids at next appointment.   Osteoporosis: . Current treatment: declined bisphosphonate. Calcium 600 mg + Vitamin D 2000 units daily . T score 01/2019: -2.9.  . Recommend to continue current regimen at this time. Strongly recommend further conversations in the future about reduction in risk of risk of hip and vertebral fractures with treatment.   GI concerns: Marland Kitchen Managed by [facility]; NAFLD w/o scarring/fibrosis. Improved with continued weight loss.  Marland Kitchen PRN use of colesevelam (diarrhea), docusate (constipation), ondansetron (nausea), esomeprazole 40 mg (GERD). Infrequent use. Reports that she was prescribed colesevelam to treat diarrhea that is caused by esomeprazole. . Recommend to continue current PRN symptom regimen at this time, given infrequent use. Recommend to continue to collaborated with GI.   Edema w/ SOB: . Managed, current regimen: Dr. Montel Culver recently changed from metolazone to torsemide 20 mg daily, though patient is skipping days that she is going out so is taking about every other day. Taking potassium 10 mg on the days she takes torsemide.  . Baseline potassium on low end of normal per [facility] labs.  . Most recent PFTs: Pre FEV1 1.59,  FVC 2.0; FEV1/FVC: 79%; post FEV1 1.72, FVC 2.11; post FEV1/FVC 82%. No mention of obstructive or restrictive disease, breathing changes related to body habitus . Discussed that it may be prudent to order f/u BMP since starting torsemide, given history of low-normal potassium. Patient hesitant to schedule extra lab work, since she sees PCP in 2 weeks. Will discuss w/ PCP . Continue current regimen at this time along with collaboration with multidisciplinary team.  Patient Goals/Self-Care Activities . Over the next 90 days, patient will:  - collaborate with provider on medication access solutions target a minimum of 150 minutes of moderate intensity exercise weekly engage in dietary modifications by continuing to reduce saturated fats, reducing carbohydrate portions  Follow Up Plan: Telephone follow up appointment with care management team member scheduled for: ~ 7 weeks        Medication Assistance: Ozempic obtained through [employer] medication assistance program.  Enrollment ends 12/16/20  Patient's preferred pharmacy is:  [place], [address] [place] [address] Phone: [phone] Fax: [place], [place]  [address] AT [place] [place] [address] Phone: [phone] Fax: [phone]   Care Plan and Follow Up Patient Decision:  Patient agrees to Care Plan and Follow-up.  Plan: Telephone follow up appointment with care management team member scheduled for:  ~ 7 weeks  Catie Darnelle Maffucci, PharmD, [place], CPP Clinical Pharmacist [place] [facility] [place]   I have collaborated with the care management provider regarding care management and care coordination activities outlined in this encounter and have reviewed this encounter including documentation in the note and care plan. I  am certifying that I agree with the content of this note and encounter as primary care provider.   Mable Paris, NP

## 2020-03-16 NOTE — Patient Instructions (Addendum)
Morgan Hurst,   It was great talking to you today!  1) When you complete 4 weeks of Ozempic 0.25 mg weekly, increase to 0.5 mg weekly. You will stay on that dose until Dr. [name] or Joycelyn Schmid are interested in you increasing to the 1 mg dose.   2) If you haven't already, please continue to discuss your arthritis or nerve pain with Dr. [name]. It sounds like he would like to get an MRI, and it would probably be best to get that before you see him next week.   3) I'm going to talk to [facility] about the torsemide and potassium level.   Call me with any questions or concerns!  Catie Darnelle Maffucci, PharmD [phone]  Visit Information  PATIENT GOALS: Goals Addressed              This Visit's Progress     Patient Stated   .  Medication Monitoring (pt-stated)        Patient Goals/Self-Care Activities . Over the next 90 days, patient will:  - collaborate with provider on medication access solutions target a minimum of 150 minutes of moderate intensity exercise weekly engage in dietary modifications by continuing to reduce saturated fats, reducing carbohydrate portions          The patient verbalized understanding of instructions, educational materials, and care plan provided today and agreed to receive a mailed copy of patient instructions, educational materials, and care plan.    Plan: Telephone follow up appointment with care management team member scheduled for:  ~ 7 weeks  Catie Darnelle Maffucci, PharmD, [place], [place] Clinical Pharmacist [employer] [facility] [employer] [phone]

## 2020-03-17 ENCOUNTER — Telehealth: Payer: Self-pay | Admitting: Family

## 2020-03-17 ENCOUNTER — Other Ambulatory Visit: Payer: Self-pay

## 2020-03-17 DIAGNOSIS — E785 Hyperlipidemia, unspecified: Secondary | ICD-10-CM

## 2020-03-17 NOTE — Telephone Encounter (Signed)
Call pt I rec'ed nice note from Catie pharmD I think with addition of ttoresemide, reasonable to check CMP She is due for lipids too  Please see if she would have this done ahead of follow up  If she is OFF zofran, metolazone,  Please take off chart

## 2020-03-17 NOTE — Telephone Encounter (Signed)
Pt stated that she is not taking either zofran or metolozone. She is only taking torsemide 41m daily PRN for fluid. She is scheduled for labs 3/10 & will be fasting.

## 2020-03-20 ENCOUNTER — Other Ambulatory Visit: Payer: Self-pay

## 2020-03-20 ENCOUNTER — Ambulatory Visit (INDEPENDENT_AMBULATORY_CARE_PROVIDER_SITE_OTHER): Payer: Medicare Other | Admitting: Family Medicine

## 2020-03-20 ENCOUNTER — Encounter: Payer: Self-pay | Admitting: Family Medicine

## 2020-03-20 ENCOUNTER — Ambulatory Visit (INDEPENDENT_AMBULATORY_CARE_PROVIDER_SITE_OTHER): Payer: Medicare Other

## 2020-03-20 VITALS — BP 130/84 | HR 93 | Ht 61.0 in

## 2020-03-20 DIAGNOSIS — M62838 Other muscle spasm: Secondary | ICD-10-CM | POA: Diagnosis not present

## 2020-03-20 DIAGNOSIS — M542 Cervicalgia: Secondary | ICD-10-CM | POA: Diagnosis not present

## 2020-03-20 MED ORDER — TIZANIDINE HCL 2 MG PO TABS: 2.0000 mg | ORAL_TABLET | Freq: Three times a day (TID) | ORAL | 1 refills | Status: DC | PRN

## 2020-03-20 NOTE — Patient Instructions (Addendum)
Thank you for coming in today.  Please go to [facility] supply to get the soft cervical collor we talked about today. You may also be able to get it from [employer].   Use heating pad and TENS unit.   I've referred you to Physical Therapy.  Let us know if you don't hear from them in one week.  Use the tizanidine muscle relaxer mostly at night.    TENS UNIT: This is helpful for muscle pain and spasm.   Search and Purchase a TENS 7000 2nd edition at  www.tenspros.com or [email or web address] It should be less than $30.     TENS unit instructions: Do not shower or bathe with the unit on . Turn the unit off before removing electrodes or batteries . If the electrodes lose stickiness add a drop of water to the electrodes after they are disconnected from the unit and place on plastic sheet. If you continued to have difficulty, call the TENS unit company to purchase more electrodes. . Do not apply lotion on the skin area prior to use. Make sure the skin is clean and dry as this will help prolong the life of the electrodes. . After use, always check skin for unusual red areas, rash or other skin difficulties. If there are any skin problems, does not apply electrodes to the same area. . Never remove the electrodes from the unit by pulling the wires. . Do not use the TENS unit or electrodes other than as directed. . Do not change electrode placement without consultating your therapist or physician. Marland Kitchen Keep 2 fingers with between each electrode. . Wear time ratio is 2:1, on to off times.    For example on for 30 minutes off for 15 minutes and then on for 30 minutes off for 15 minutes    Recheck if not improved.

## 2020-03-20 NOTE — Progress Notes (Signed)
Morgan Hurst, am serving as a Education administrator for Dr. Lynne Leader.  Morgan Hurst is a 75 y.o. female who presents to [place] at [facility] today for neck pain. Patient last saw Dr, [name] on 02/25/2020 for low back pain. Patient being seen today for neck pain going on for 3 days. Patient woke up with a sore neck 3 days ago and tried [place] and resting. Patient states today her neck hurts so bad that it hurts to open her eyes or swallow. Patient complains of pain on the top of her head, cannot find any position that relieves the pain, pain is causing nausea, and patient locates the pain to the mid back neck, pain did radiate to the right side of her neck but that has gone away. Patient is using a towel to help hold her head up. Patient has gabapentin and prednisone that she was using for her back and the prednisone helped a little with the pain.    Pertinent review of systems: No fevers or chills  Relevant historical information: Lumbar spinal stenosis.  Cervical disc disease with radiculopathy in the past   Exam:  BP 130/84 (BP Location: Left Arm, Patient Position: Sitting, Cuff Size: Large)   Pulse 93   Ht 5' 1"  (1.549 m)   SpO2 94%   BMI 41.00 kg/m  General: Well Developed, well nourished, and in no acute distress.   MSK: C-spine normal-appearing Diffusely tender cervical spine.  Quite tender right cervical paraspinal musculature.  Right trapezius and paraspinal musculature are rigid. Patient is tender palpation midline. Upper extremity strength is intact. Neuropsych alert and oriented normal coordination normal speech thought process and affect.    Lab and Radiology Results  X-ray images C-spine obtained today personally and independently interpreted No fractures.  Loss of cervical lordosis indicating spasm.  Mild degenerative changes present Await formal radiology review    Assessment and Plan: 75 y.o. female with severe neck pain due to cervical spasm.   Plan for physical therapy.  Recommend heating pad TENS unit.  Additionally limited tizanidine in conjunction with existing medication.  Caution with use of tizanidine with tramadol.  Patient is using a towel to help support her neck.  She will probably benefit from a soft collar which she can purchase at a medical supply company.  If not improving patient will notify me and could consider further advanced imaging including possibly MRI. Symptoms are severe.   PDMP reviewed during this encounter. Orders Placed This Encounter  Procedures  . DG Cervical Spine 2 or 3 views    Standing Status:   Future    Number of Occurrences:   1    Standing Expiration Date:   03/20/2021    Order Specific Question:   Reason for Exam (SYMPTOM  OR DIAGNOSIS REQUIRED)    Answer:   eval cervical pain    Order Specific Question:   Preferred imaging location?    Answer:   [facility]  . Ambulatory referral to Physical Therapy    Referral Priority:   Routine    Referral Type:   Physical Medicine    Referral Reason:   Specialty Services Required    Requested Specialty:   Physical Therapy    Number of Visits Requested:   1   Meds ordered this encounter  Medications  . tiZANidine (ZANAFLEX) 2 MG tablet    Sig: Take 1-2 tablets (2-4 mg total) by mouth every 8 (eight) hours as needed for muscle  spasms.    Dispense:  60 tablet    Refill:  1     Discussed warning signs or symptoms. Please see discharge instructions. Patient expresses understanding.   The above documentation has been reviewed and is accurate and complete Lynne Leader, M.D.

## 2020-03-23 NOTE — Progress Notes (Signed)
[place] Phone: [phone] Subjective:   [name], am serving as a scribe for Dr. Hulan Saas. This visit occurred during the SARS-CoV-2 public health emergency.  Safety protocols were in place, including screening questions prior to the visit, additional usage of staff PPE, and extensive cleaning of exam room while observing appropriate contact time as indicated for disinfecting solutions.   I'm seeing this patient by the request  of:  Burnard Hawthorne, FNP  CC: Low back pain and neck pain follow-up  HBZ:[id number]   02/25/2020 Patient has had difficulty with spinal stenosis previously.  I believe the patient is having an exacerbation.  Given a low dose of gabapentin that I am hoping will be beneficial.  Warned of potential side effects.  We discussed prednisone for short course.  Patient will hold on all the other anti-inflammatories.  Patient will watch for any type of swelling, patient denies any type of abdominal pain, bowel or bladder changes, or any type of lower extremity weakness.  If worsening pain I do think that advanced imaging is warranted such as an MRI but will repeat x-rays today.  Update 03/24/2020 Morgan Hurst is a 75 y.o. female coming in with complaint of low back pain. Patient was seen by Dr. Georgina Snell last week for acute cervical spine pain. Patient states that her pain worsened over the weekend. Pain was radiating into the right arm and shoulder. Having hard time rotating to the right. Has been using cervical collar over the weekend. Wants to know if arthritis is causing her neck spasms.   Patient has felt some improvement in right hip and leg.  Patient states that the prednisone when she took it was not increasing improvement.  Since the neck pain and not being as active starting to have some more discomfort again with mild radicular symptoms.      Past Medical History:  Diagnosis Date   . Arthritis    lower back, right hip  . B12 deficiency   . Back pain   . Borderline diabetes    PCP STARTED PT ON METFORMIN IN 2016 DUE TO ELEVATED GLUCOSE   . Complication of anesthesia    FIGHTING WHEN WAKING UP FROM ANESTHESIA  . Constipation   . Diabetes mellitus without complication ([place])   . Endometriosis   . Fatty liver   . Gallbladder problem   . GERD (gastroesophageal reflux disease)   . Heart murmur   . High cholesterol   . Hip pain   . Joint pain   . Kidney stones   . Leg weakness   . Orthodontics    top front 3 teeth caps and bridge  . Prediabetes   . RA (rheumatoid arthritis) ([place])   . SOB (shortness of breath)   . Stomach ulcer   . Swelling   . Vitamin D deficiency    Past Surgical History:  Procedure Laterality Date  . ABDOMINAL HYSTERECTOMY  age 34  . ANKLE SURGERY    . CHOLECYSTECTOMY N/A 04/10/2015   Procedure: LAPAROSCOPIC CHOLECYSTECTOMY WITH INTRAOPERATIVE CHOLANGIOGRAM;  Surgeon: Robert Bellow, MD;  Location: [facility] ORS;  Service: General;  Laterality: N/A;  . COLONOSCOPY WITH PROPOFOL N/A 07/10/2017   Procedure: COLONOSCOPY WITH PROPOFOL;  Surgeon: Jonathon Bellows, MD;  Location: [facility] ENDOSCOPY;  Service: Gastroenterology;  Laterality: N/A;  . ESOPHAGOGASTRODUODENOSCOPY N/A 05/12/2015   Procedure: ESOPHAGOGASTRODUODENOSCOPY (EGD);  Surgeon: Hulen Luster, MD;  Location: [facility];  Service: Gastroenterology;  Laterality: N/A;  . VAGINAL DELIVERY  2   Social History   Socioeconomic History  . Marital status: Widowed    Spouse name: Not on file  . Number of children: 2  . Years of education: Not on file  . Highest education level: Not on file  Occupational History  . Occupation: Retired  Tobacco Use  . Smoking status: Never Smoker  . Smokeless tobacco: Never Used  Vaping Use  . Vaping Use: Never used  Substance and Sexual Activity  . Alcohol use: No  . Drug use: No  . Sexual activity: Not Currently  Other Topics Concern  . Not on  file  Social History Narrative   Lives in [place].       Husband passed away 03/23/14 from PNA.      Work - retired from [employer] - regular; working on [employer]   Exercise - walks occasionally, limited by fatigue. [place] and takes care of her 49 year old grandchild.    Social Determinants of Health   Financial Resource Strain: Medium Risk  . Difficulty of Paying Living Expenses: Somewhat hard  Food Insecurity: Not on file  Transportation Needs: No Transportation Needs  . Lack of Transportation (Medical): No  . Lack of Transportation (Non-Medical): No  Physical Activity: Not on file  Stress: No Stress Concern Present  . Feeling of Stress : Not at all  Social Connections: Moderately Integrated  . Frequency of Communication with Friends and Family: More than three times a week  . Frequency of Social Gatherings with Friends and Family: More than three times a week  . Attends Religious Services: 1 to 4 times per year  . Active Member of Clubs or Organizations: Yes  . Attends Archivist Meetings: 1 to 4 times per year  . Marital Status: Widowed   No Known Allergies Family History  Problem Relation Age of Onset  . Diabetes Mother   . Cancer Mother        colon and breast  . Breast cancer Mother   . Liver disease Mother   . Cancer Father        Leukemia  . Cancer Brother        colon and lung  . Cholelithiasis Daughter   . Heart disease Paternal Grandfather   . Cancer Other 29       colon  . Thyroid cancer Neg Hx     Current Outpatient Medications (Endocrine & Metabolic):  .  predniSONE (DELTASONE) 20 MG tablet, Take 1 tablet (20 mg total) by mouth daily with breakfast. .  Semaglutide,0.25 or 0.5MG/DOS, (OZEMPIC, 0.25 OR 0.5 MG/DOSE,) 2 MG/1.5ML SOPN, Inject 0.25 mg into the skin once a week. After 4 weeks, increase to 0.5 mg weekly  Current Outpatient Medications (Cardiovascular):  .  colesevelam (WELCHOL) 625 MG tablet, Take 625 mg by mouth  daily as needed. .  torsemide (DEMADEX) 20 MG tablet, Take 20 mg by mouth daily as needed.   Current Outpatient Medications (Analgesics):  .  acetaminophen (TYLENOL) 650 MG CR tablet, Take 650 mg by mouth every 8 (eight) hours as needed for pain. Marland Kitchen  aspirin 81 MG tablet, Take 81 mg by mouth daily. .  traMADol (ULTRAM) 50 MG tablet, TAKE 1 TABLET BY MOUTH DAILY AS NEEDED  Current Outpatient Medications (Hematological):  .  cyanocobalamin (,VITAMIN B-12,) 1000 MCG/ML injection, INJECT 1 ML ONCE PER MONTH  Current Outpatient Medications (Other):  .  calcium carbonate (OSCAL) 1500 (600 Ca) MG TABS tablet, Take 600 mg by mouth daily. .  cholecalciferol (VITAMIN D3) 25 MCG (1000 UNIT) tablet, Take 2,000 Units by mouth daily. .  diazepam (VALIUM) 5 MG tablet, One tab by mouth, 2 hours before procedure. .  esomeprazole (NEXIUM) 40 MG capsule, Take by mouth. .  gabapentin (NEURONTIN) 100 MG capsule, Take two capsules (251m total) daily at bedtime .  magnesium oxide (MAG-OX) 400 MG tablet, Take 400 mg by mouth daily. .  potassium chloride (KLOR-CON) 10 MEQ tablet, Take 1 tablet by mouth twice daily. **Take with Metolazone.** .  rOPINIRole (REQUIP) 0.25 MG tablet, TAKE 1 TABLET(0.25 MG) BY MOUTH AT BEDTIME AS NEEDED .  tiZANidine (ZANAFLEX) 2 MG tablet, Take 1-2 tablets (2-4 mg total) by mouth every 8 (eight) hours as needed for muscle spasms.   Reviewed prior external information including notes and imaging from  primary care provider As well as notes that were available from care everywhere and other healthcare systems.  Past medical history, social, surgical and family history all reviewed in electronic medical record.  No pertanent information unless stated regarding to the chief complaint.   Review of Systems:  No headache, visual changes, nausea, vomiting, diarrhea, constipation, dizziness, abdominal pain, skin rash, fevers, chills, night sweats, weight loss, swollen lymph nodes,  joint  swelling, chest pain, shortness of breath, mood changes. POSITIVE muscle aches, body aches  Objective  Blood pressure 110/72, pulse 79, height 5' 1"  (1.549 m), weight 217 lb (98.4 kg), SpO2 98 %.   General: No apparent distress alert and oriented x3 mood and affect normal, dressed appropriately.  HEENT: Pupils equal, extraocular movements intact  Respiratory: Patient's speak in full sentences and does not appear short of breath  Cardiovascular: No lower extremity edema, non tender, no erythema  Gait antalgic gait MSK: Neck exam does have loss of lordosis.  Positive Spurling's in the C8 distribution.  On the right side.  Patient does have weakness noted in the C8 distribution on the right side as well patient has some limited range of motion secondary to tightness of the trapezius muscle on the right compared to left.  Low back exam does have some loss of lordosis.  Tenderness to palpation diffusely.  Mild positive straight leg test on the right side at 20 degrees of forward flexion.  Patient does have some mild loss of strength with dorsi flexion on the left side.  Patient does have very mild weakness also on the right side.    Impression and Recommendations:     The above documentation has been reviewed and is accurate and complete ZLyndal Pulley DO

## 2020-03-24 ENCOUNTER — Encounter: Payer: Self-pay | Admitting: Family Medicine

## 2020-03-24 ENCOUNTER — Ambulatory Visit: Payer: Medicare Other | Admitting: Family Medicine

## 2020-03-24 ENCOUNTER — Other Ambulatory Visit: Payer: Self-pay

## 2020-03-24 VITALS — BP 110/72 | HR 79 | Ht 61.0 in | Wt 217.0 lb

## 2020-03-24 DIAGNOSIS — M5010 Cervical disc disorder with radiculopathy, unspecified cervical region: Secondary | ICD-10-CM | POA: Diagnosis not present

## 2020-03-24 DIAGNOSIS — M48062 Spinal stenosis, lumbar region with neurogenic claudication: Secondary | ICD-10-CM | POA: Diagnosis not present

## 2020-03-24 DIAGNOSIS — M545 Low back pain, unspecified: Secondary | ICD-10-CM

## 2020-03-24 DIAGNOSIS — M542 Cervicalgia: Secondary | ICD-10-CM

## 2020-03-24 MED ORDER — PREDNISONE 20 MG PO TABS: 20.0000 mg | ORAL_TABLET | Freq: Every day | ORAL | 0 refills | Status: DC

## 2020-03-24 MED ORDER — DIAZEPAM 5 MG PO TABS: ORAL_TABLET | ORAL | 0 refills | Status: DC

## 2020-03-24 NOTE — Assessment & Plan Note (Signed)
History of spinal stenosis.  Likely worsening at this time.  We will do 1 more round of prednisone but I do feel that advanced imaging with an MRI secondary to the radicular symptoms and some of the mild dorsiflexion weakness noted.  Patient has responded somewhat to a cortisone injection in 2017 and may need repeat but do feel advanced imaging is necessary.  Patient does have claustrophobia and giving Valium.  Patient's daughter was at bedside and will likely start with 1/2 pill before the test with patient having difficulty with medications.  Follow-up with me again after imaging to discuss

## 2020-03-24 NOTE — Patient Instructions (Addendum)
MRI neck and lumbar [id number] Prednisone 1m for 5 days If symptoms worsen, please seek medical care at the emergency room

## 2020-03-24 NOTE — Assessment & Plan Note (Signed)
Patient is having radicular symptoms at this time.  Do feel that with the radicular symptoms and weakness noted in the C8 distribution and the amount of pain patient is having and is not being well tolerated with the tramadol and the Tylenol encourage patient to try prednisone and will get advanced imaging.  Patient knows to hold any type of anti-inflammatories and discussed this with her as well as her daughter.  Patient then will have the MRI and depending on this could be a candidate for possible epidurals.  Patient of course would like to avoid any type of surgical intervention if possible.  Encouraged her to continue to attempt to try to take the gabapentin if possible.  Patient is taking a muscle relaxer as well.  Patient is concerned because of some of the side effects.  Follow-up with me again after imaging to discuss

## 2020-03-26 ENCOUNTER — Other Ambulatory Visit: Payer: Self-pay

## 2020-03-26 ENCOUNTER — Other Ambulatory Visit (INDEPENDENT_AMBULATORY_CARE_PROVIDER_SITE_OTHER): Payer: Medicare Other

## 2020-03-26 DIAGNOSIS — E785 Hyperlipidemia, unspecified: Secondary | ICD-10-CM | POA: Diagnosis not present

## 2020-03-26 DIAGNOSIS — R8290 Unspecified abnormal findings in urine: Secondary | ICD-10-CM

## 2020-03-26 LAB — MICROALBUMIN / CREATININE URINE RATIO
Creatinine,U: 202.4 mg/dL
Microalb Creat Ratio: 0.9 mg/g (ref 0.0–30.0)
Microalb, Ur: 1.7 mg/dL (ref 0.0–1.9)

## 2020-03-26 LAB — LIPID PANEL
Cholesterol: 182 mg/dL (ref 0–200)
HDL: 48.6 mg/dL (ref >39.00)
LDL Cholesterol: 114 mg/dL — ABNORMAL HIGH (ref 0–99)
NonHDL: 133.02
Total CHOL/HDL Ratio: 4
Triglycerides: 94 mg/dL (ref 0.0–149.0)
VLDL: 18.8 mg/dL (ref 0.0–40.0)

## 2020-03-26 LAB — COMPREHENSIVE METABOLIC PANEL
ALT: 14 U/L (ref 0–35)
AST: 16 U/L (ref 0–37)
Albumin: 3.9 g/dL (ref 3.5–5.2)
Alkaline Phosphatase: 66 U/L (ref 39–117)
BUN: 21 mg/dL (ref 6–23)
CO2: 35 mEq/L — ABNORMAL HIGH (ref 19–32)
Calcium: 10 mg/dL (ref 8.4–10.5)
Chloride: 100 mEq/L (ref 96–112)
Creatinine, Ser: 0.78 mg/dL (ref 0.40–1.20)
GFR: 75.15 mL/min (ref >60.00)
Glucose, Bld: 84 mg/dL (ref 70–99)
Potassium: 3.7 mEq/L (ref 3.5–5.1)
Sodium: 142 mEq/L (ref 135–145)
Total Bilirubin: 0.4 mg/dL (ref 0.2–1.2)
Total Protein: 7.3 g/dL (ref 6.0–8.3)

## 2020-03-26 NOTE — Progress Notes (Signed)
X-ray cervical spine shows multilevel arthritis changes.  No acute findings.

## 2020-03-31 ENCOUNTER — Ambulatory Visit (INDEPENDENT_AMBULATORY_CARE_PROVIDER_SITE_OTHER): Payer: Medicare Other | Admitting: Family

## 2020-03-31 ENCOUNTER — Encounter: Payer: Self-pay | Admitting: Family

## 2020-03-31 ENCOUNTER — Other Ambulatory Visit: Payer: Self-pay

## 2020-03-31 VITALS — BP 124/68 | HR 80 | Temp 97.8°F | Ht 61.0 in | Wt 212.2 lb

## 2020-03-31 DIAGNOSIS — M5010 Cervical disc disorder with radiculopathy, unspecified cervical region: Secondary | ICD-10-CM

## 2020-03-31 DIAGNOSIS — Z7982 Long term (current) use of aspirin: Secondary | ICD-10-CM

## 2020-03-31 DIAGNOSIS — R0600 Dyspnea, unspecified: Secondary | ICD-10-CM

## 2020-03-31 DIAGNOSIS — Z6841 Body Mass Index (BMI) 40.0 and over, adult: Secondary | ICD-10-CM

## 2020-03-31 MED ORDER — DULOXETINE HCL 30 MG PO CPEP: ORAL_CAPSULE | ORAL | 3 refills | Status: DC

## 2020-03-31 NOTE — Patient Instructions (Addendum)
Start cymbalta   You may stop aspirin 16m As discussed, for now we will STOP aspirin 856mwith close attention to benefit versus risk.  Benefit being reduced risk of ischemic stroke, non fatal heart attack and colorectal cancer ( if used for 10 years). However these benefits are most apparent in adults aged 6065o 6952ears with a ?10% 10-year cardiovasular risk . Persons who are not at increased risk for bleeding, have a life expectancy of at least 10 years, and are willing to take low-dose aspirin daily for at least 10 years are more likely to benefit. Persons who place a higher value on the potential benefits than the potential harms may choose to initiate low-dose aspirin.  At 70 years and above the risk of gastrointestinal bleeding, falls,  hemorrhagic stroke increases therefore becoming a very personal discussion in regards to whether you continue aspirin 81 mg.   For now , we have opted to discontinue however please bring to my attention at follow so we can continue to have this discussion.    Duloxetine Delayed-Release Capsules What is this medicine? DULOXETINE (doo LOX e teen) is used to treat depression, anxiety, and different types of chronic pain. This medicine may be used for other purposes; ask your health care provider or pharmacist if you have questions. COMMON BRAND NAME(S): Cymbalta, Morgan HinesIrenka What should I tell my health care provider before I take this medicine? They need to know if you have any of these conditions:  bipolar disorder  glaucoma  high blood pressure  kidney disease  liver disease  seizures  suicidal thoughts, plans or attempt; a previous suicide attempt by you or a family member  take medicines that treat or prevent blood clots  taken medicines called MAOIs like Carbex, Eldepryl, Marplan, Nardil, and Parnate within 14 days  trouble passing urine  an unusual reaction to duloxetine, other medicines, foods, dyes, or  preservatives  pregnant or trying to get pregnant  breast-feeding How should I use this medicine? Take this medicine by mouth with a glass of water. Follow the directions on the prescription label. Do not crush, cut or chew some capsules of this medicine. Some capsules may be opened and sprinkled on applesauce. Check with your doctor or pharmacist if you are not sure. You can take this medicine with or without food. Take your medicine at regular intervals. Do not take your medicine more often than directed. Do not stop taking this medicine suddenly except upon the advice of your doctor. Stopping this medicine too quickly may cause serious side effects or your condition may worsen. A special MedGuide will be given to you by the pharmacist with each prescription and refill. Be sure to read this information carefully each time. Talk to your pediatrician regarding the use of this medicine in children. While this drug may be prescribed for children as young as 7 72ears of age for selected conditions, precautions do apply. Overdosage: If you think you have taken too much of this medicine contact a poison control center or emergency room at once. NOTE: This medicine is only for you. Do not share this medicine with others. What if I miss a dose? If you miss a dose, take it as soon as you can. If it is almost time for your next dose, take only that dose. Do not take double or extra doses. What may interact with this medicine? Do not take this medicine with any of the following medications:  desvenlafaxine  levomilnacipran  linezolid  MAOIs like Carbex, Eldepryl, Emsam, Marplan, Nardil, and Parnate  methylene blue (injected into a vein)  milnacipran  safinamide  thioridazine  venlafaxine  viloxazine This medicine may also interact with the following medications:  alcohol  amphetamines  aspirin and aspirin-like medicines  certain antibiotics like ciprofloxacin and enoxacin  certain  medicines for blood pressure, heart disease, irregular heart beat  certain medicines for depression, anxiety, or psychotic disturbances  certain medicines for migraine headache like almotriptan, eletriptan, frovatriptan, naratriptan, rizatriptan, sumatriptan, zolmitriptan  certain medicines that treat or prevent blood clots like warfarin, enoxaparin, and dalteparin  cimetidine  fentanyl  lithium  NSAIDS, medicines for pain and inflammation, like ibuprofen or naproxen  phentermine  procarbazine  rasagiline  sibutramine  St. John's wort  theophylline  tramadol  tryptophan This list may not describe all possible interactions. Give your health care provider a list of all the medicines, herbs, non-prescription drugs, or dietary supplements you use. Also tell them if you smoke, drink alcohol, or use illegal drugs. Some items may interact with your medicine. What should I watch for while using this medicine? Tell your doctor if your symptoms do not get better or if they get worse. Visit your doctor or healthcare provider for regular checks on your progress. Because it may take several weeks to see the full effects of this medicine, it is important to continue your treatment as prescribed by your doctor. This medicine may cause serious skin reactions. They can happen weeks to months after starting the medicine. Contact your healthcare provider right away if you notice fevers or flu-like symptoms with a rash. The rash may be red or purple and then turn into blisters or peeling of the skin. Or, you might notice a red rash with swelling of the face, lips, or lymph nodes in your neck or under your arms. Patients and their families should watch out for new or worsening thoughts of suicide or depression. Also watch out for sudden changes in feelings such as feeling anxious, agitated, panicky, irritable, hostile, aggressive, impulsive, severely restless, overly excited and hyperactive, or not  being able to sleep. If this happens, especially at the beginning of treatment or after a change in dose, call your healthcare provider. You may get drowsy or dizzy. Do not drive, use machinery, or do anything that needs mental alertness until you know how this medicine affects you. Do not stand or sit up quickly, especially if you are an older patient. This reduces the risk of dizzy or fainting spells. Alcohol may interfere with the effect of this medicine. Avoid alcoholic drinks. This medicine can cause an increase in blood pressure. This medicine can also cause a sudden drop in your blood pressure, which may make you feel faint and increase the chance of a fall. These effects are most common when you first start the medicine or when the dose is increased, or during use of other medicines that can cause a sudden drop in blood pressure. Check with your doctor for instructions on monitoring your blood pressure while taking this medicine. Your mouth may get dry. Chewing sugarless gum or sucking hard candy, and drinking plenty of water, may help. Contact your doctor if the problem does not go away or is severe. What side effects may I notice from receiving this medicine? Side effects that you should report to your doctor or health care professional as soon as possible:  allergic reactions like skin rash, itching or hives, swelling of the face, lips, or tongue  anxious  breathing problems  confusion  changes in vision  chest pain  confusion  elevated mood, decreased need for sleep, racing thoughts, impulsive behavior  eye pain  fast, irregular heartbeat  feeling faint or lightheaded, falls  feeling agitated, angry, or irritable  hallucination, loss of contact with reality  high blood pressure  loss of balance or coordination  palpitations  redness, blistering, peeling or loosening of the skin, including inside the mouth  restlessness, pacing, inability to keep  still  seizures  stiff muscles  suicidal thoughts or other mood changes  trouble passing urine or change in the amount of urine  trouble sleeping  unusual bleeding or bruising  unusually weak or tired  vomiting  yellowing of the eyes or skin Side effects that usually do not require medical attention (report to your doctor or health care professional if they continue or are bothersome):  change in sex drive or performance  change in appetite or weight  constipation  dizziness  dry mouth  headache  increased sweating  nausea  tired This list may not describe all possible side effects. Call your doctor for medical advice about side effects. You may report side effects to FDA at 1-800-FDA-1088. Where should I keep my medicine? Keep out of the reach of children and pets. Store at room temperature between 15 and 30 degrees C (59 to 86 degrees F). Get rid of any unused medicine after the expiration date. To get rid of medicines that are no longer needed or have expired:  Take the medicine to a medicine take-back program. Check with your pharmacy or law enforcement to find a location.  If you cannot return the medicine, check the label or package insert to see if the medicine should be thrown out in the garbage or flushed down the toilet. If you are not sure, ask your health care provider. If it is safe to put it in the trash, take the medicine out of the container. Mix the medicine with cat litter, dirt, coffee grounds, or other unwanted substance. Seal the mixture in a bag or container. Put it in the trash. NOTE: This sheet is a summary. It may not cover all possible information. If you have questions about this medicine, talk to your doctor, pharmacist, or health care provider.  2021 Elsevier/Gold Standard (2019-11-21 16:06:16)

## 2020-03-31 NOTE — Progress Notes (Signed)
Subjective:    Patient ID: Morgan Hurst, female    DOB: Aug 26, 1945, 75 y.o.   MRN: [id number]  CC: Morgan Hurst is a 75 y.o. female who presents today for follow up.   HPI: Complains of posterior neck, right hip, and right shoulder pain which has been on going for one year. Neck pain is most bothersome.   No numbness, weakness, HA, vision changes.  Feels like 'arthritis' has gotten worse. Endorses stiffness in neck improved after prednisone.    Compliant with tramadol 50 mg daily, 1/2-1 tab daily; APAP 650 mg BID, gabapentin 200 mg QPM - though using 100 mg QPM every evening.  Dr [name] prescribed prednisone 61m one week ago which was very helpful.    Dyspnea- Improved. Following with Dr AMontel Culver Compliant with torsemide 2167mprn with potassium 10 meq  Potassium 3.7 five days ago  prediabetic- compliant with ozempic 0.67m37mHas taken asa 73m77mor years'.  No cva, mi. No family h/o MI. No h/o GIB.  Dr CoreGeorgina Snell patient on 3/4 and started patient on zanaflex. XR cervical spine showed multilevel cervical degenerative changes. Pending MRI c spine.  HISTORY:  Past Medical History:  Diagnosis Date  . Arthritis    lower back, right hip  . B12 deficiency   . Back pain   . Borderline diabetes    PCP STARTED PT ON METFORMIN IN 2016 DUE TO ELEVATED GLUCOSE   . Complication of anesthesia    FIGHTING WHEN WAKING UP FROM ANESTHESIA  . Constipation   . Diabetes mellitus without complication (HCC)[place]. Endometriosis   . Fatty liver   . Gallbladder problem   . GERD (gastroesophageal reflux disease)   . Heart murmur   . High cholesterol   . Hip pain   . Joint pain   . Kidney stones   . Leg weakness   . Orthodontics    top front 3 teeth caps and bridge  . Prediabetes   . RA (rheumatoid arthritis) (HCC)[place]. SOB (shortness of breath)   . Stomach ulcer   . Swelling   . Vitamin D deficiency    Past Surgical History:  Procedure Laterality Date  . ABDOMINAL HYSTERECTOMY   age 36  63ANKLE SURGERY    . CHOLECYSTECTOMY N/A 04/10/2015   Procedure: LAPAROSCOPIC CHOLECYSTECTOMY WITH INTRAOPERATIVE CHOLANGIOGRAM;  Surgeon: JeffRobert Bellow;  Location: [facility];  Service: General;  Laterality: N/A;  . COLONOSCOPY WITH PROPOFOL N/A 07/10/2017   Procedure: COLONOSCOPY WITH PROPOFOL;  Surgeon: AnnaJonathon Bellows;  [facility];  Service: Gastroenterology;  Laterality: N/A;  . ESOPHAGOGASTRODUODENOSCOPY N/A 05/12/2015   Procedure: ESOPHAGOGASTRODUODENOSCOPY (EGD);  Surgeon: PaulHulen Luster;  [facility];  Laterality: N/A;  . VAGINAL DELIVERY  2   Family History  Problem Relation Age of Onset  . Diabetes Mother   . Cancer Mother        colon and breast  . Breast cancer Mother   . Liver disease Mother   . Cancer Father        Leukemia  . Cancer Brother        colon and lung  . Colon cancer Brother   . Cholelithiasis Daughter   . Heart disease Paternal Grandfather   . Cancer Other 29       colon  . Thyroid cancer Neg Hx     Allergies: Patient has no known allergies. Current Outpatient Medications on  File Prior to Visit  Medication Sig Dispense Refill  . acetaminophen (TYLENOL) 650 MG CR tablet Take 650 mg by mouth every 8 (eight) hours as needed for pain.    . calcium carbonate (OSCAL) 1500 (600 Ca) MG TABS tablet Take 600 mg by mouth daily.    . cholecalciferol (VITAMIN D3) 25 MCG (1000 UNIT) tablet Take 2,000 Units by mouth daily.    . colesevelam (WELCHOL) 625 MG tablet Take 625 mg by mouth daily as needed.    . cyanocobalamin (,VITAMIN B-12,) 1000 MCG/ML injection INJECT 1 ML ONCE PER MONTH 1 mL 15  . diazepam (VALIUM) 5 MG tablet One tab by mouth, 2 hours before procedure. 2 tablet 0  . esomeprazole (NEXIUM) 40 MG capsule Take by mouth.    . gabapentin (NEURONTIN) 100 MG capsule Take two capsules (24m total) daily at bedtime 30 capsule 1  . magnesium oxide (MAG-OX) 400 MG tablet Take 400 mg by  mouth daily.    . potassium chloride (KLOR-CON) 10 MEQ tablet Take 1 tablet by mouth twice daily. **Take with Metolazone.** 180 tablet 0  . rOPINIRole (REQUIP) 0.25 MG tablet TAKE 1 TABLET(0.25 MG) BY MOUTH AT BEDTIME AS NEEDED 90 tablet 0  . Semaglutide,0.25 or 0.5MG/DOS, (OZEMPIC, 0.25 OR 0.5 MG/DOSE,) 2 MG/1.5ML SOPN Inject 0.5 mg into the skin once a week. Inject 0.5 mg into skin weekly    . tiZANidine (ZANAFLEX) 2 MG tablet Take 1-2 tablets (2-4 mg total) by mouth every 8 (eight) hours as needed for muscle spasms. 60 tablet 1  . torsemide (DEMADEX) 20 MG tablet Take 20 mg by mouth daily as needed.    . traMADol (ULTRAM) 50 MG tablet TAKE 1 TABLET BY MOUTH DAILY AS NEEDED 30 tablet 1   No current facility-administered medications on file prior to visit.    Social History   Tobacco Use  . Smoking status: Never Smoker  . Smokeless tobacco: Never Used  Vaping Use  . Vaping Use: Never used  Substance Use Topics  . Alcohol use: No  . Drug use: No    Review of Systems  Constitutional: Negative for chills and fever.  Respiratory: Positive for shortness of breath (improved). Negative for cough.   Cardiovascular: Negative for chest pain and palpitations.  Gastrointestinal: Negative for nausea and vomiting.  Musculoskeletal: Positive for arthralgias, back pain, neck pain and neck stiffness.      Objective:    BP 124/68   Pulse 80   Temp 97.8 F (36.6 C)   Ht 5' 1"  (1.549 m)   Wt 212 lb 3.2 oz (96.3 kg)   SpO2 98%   BMI 40.09 kg/m  BP Readings from Last 3 Encounters:  04/01/20 110/76  03/31/20 124/68  03/24/20 110/72   Wt Readings from Last 3 Encounters:  04/01/20 206 lb (93.4 kg)  03/31/20 212 lb 3.2 oz (96.3 kg)  03/24/20 217 lb (98.4 kg)    Physical Exam Vitals reviewed.  Constitutional:      Appearance: She is well-developed.  Eyes:     Conjunctiva/sclera: Conjunctivae normal.  Cardiovascular:     Rate and Rhythm: Normal rate and regular rhythm.     Pulses:  Normal pulses.     Heart sounds: Normal heart sounds.  Pulmonary:     Effort: Pulmonary effort is normal.     Breath sounds: Normal breath sounds. No wheezing, rhonchi or rales.  Skin:    General: Skin is warm and dry.  Neurological:     Mental  Status: She is alert.  Psychiatric:        Speech: Speech normal.        Behavior: Behavior normal.        Thought Content: Thought content normal.        Assessment & Plan:   Problem List Items Addressed This Visit      Other   Aspirin long-term use    H/o gib, age >50. Advised to stop in setting of primary prevention.       Dyspnea    Improved. Continue toresemide 83m with potassium 121m. She is following with Dr AlMontel Culverwill follow.       Neck pain - Primary    Chronic. Improved. Following with Dr SmTamala Julianpending MRI c spine. Agreed that cymbalta would be quite reasonable to trial. Continue  tramadol 50 mg daily, 1/2-1 tab daily; APAP 650 mg BID, gabapentin 200 mg QPM - though using 100 mg QPM every evening. Will follow.       Relevant Medications   DULoxetine (CYMBALTA) 30 MG capsule   Obesity    She is actively working on weight loss. Continue ozempic 0.55m71m        I have discontinued Morgan Hurst's aspirin. I am also having her start on DULoxetine. Additionally, I am having her maintain her esomeprazole, colesevelam, cholecalciferol, calcium carbonate, cyanocobalamin, rOPINIRole, traMADol, acetaminophen, magnesium oxide, gabapentin, potassium chloride, torsemide, Ozempic (0.25 or 0.5 MG/DOSE), tiZANidine, and diazepam.   Meds ordered this encounter  Medications  . DULoxetine (CYMBALTA) 30 MG capsule    Sig: Take one 30 mg tablet by mouth once a day for the first week. Then increase to two 30 mg tablets ( total 72m23my mouth once daily.    Dispense:  60 capsule    Refill:  3    Order Specific Question:   Supervising Provider    Answer:   TULLCrecencio [id number]]    Return precautions given.   Risks,  benefits, and alternatives of the medications and treatment plan prescribed today were discussed, and patient expressed understanding.   Education regarding symptom management and diagnosis given to patient on AVS.  Continue to follow with ArneBurnard HawthorneP for routine health maintenance.   BettTheodoro Doing I agreed with plan.   MargMable ParisP

## 2020-04-01 ENCOUNTER — Ambulatory Visit (INDEPENDENT_AMBULATORY_CARE_PROVIDER_SITE_OTHER): Payer: Medicare Other | Admitting: Family Medicine

## 2020-04-01 ENCOUNTER — Encounter (INDEPENDENT_AMBULATORY_CARE_PROVIDER_SITE_OTHER): Payer: Self-pay | Admitting: Family Medicine

## 2020-04-01 VITALS — BP 110/76 | HR 88 | Temp 97.6°F | Ht 61.0 in | Wt 206.0 lb

## 2020-04-01 DIAGNOSIS — E8881 Metabolic syndrome: Secondary | ICD-10-CM | POA: Diagnosis not present

## 2020-04-01 DIAGNOSIS — R52 Pain, unspecified: Secondary | ICD-10-CM

## 2020-04-01 DIAGNOSIS — Z6839 Body mass index (BMI) 39.0-39.9, adult: Secondary | ICD-10-CM | POA: Diagnosis not present

## 2020-04-01 DIAGNOSIS — G8929 Other chronic pain: Secondary | ICD-10-CM

## 2020-04-01 DIAGNOSIS — E669 Obesity, unspecified: Secondary | ICD-10-CM | POA: Insufficient documentation

## 2020-04-01 DIAGNOSIS — K760 Fatty (change of) liver, not elsewhere classified: Secondary | ICD-10-CM | POA: Diagnosis not present

## 2020-04-01 DIAGNOSIS — Z7982 Long term (current) use of aspirin: Secondary | ICD-10-CM | POA: Insufficient documentation

## 2020-04-01 MED ORDER — DICLOFENAC SODIUM 75 MG PO TBEC: 75.0000 mg | DELAYED_RELEASE_TABLET | Freq: Two times a day (BID) | ORAL | 0 refills | Status: DC

## 2020-04-01 NOTE — Assessment & Plan Note (Signed)
She is actively working on weight loss. Continue ozempic 0.61m

## 2020-04-01 NOTE — Assessment & Plan Note (Signed)
Chronic. Improved. Following with Dr [name], pending MRI c spine. Agreed that cymbalta would be quite reasonable to trial. Continue  tramadol 50 mg daily, 1/2-1 tab daily; APAP 650 mg BID, gabapentin 200 mg QPM - though using 100 mg QPM every evening. Will follow.

## 2020-04-01 NOTE — Assessment & Plan Note (Signed)
H/o gib, age >57. Advised to stop in setting of primary prevention.

## 2020-04-01 NOTE — Assessment & Plan Note (Signed)
Improved. Continue toresemide 81m with potassium 160m. She is following with Dr AlMontel Culverwill follow.

## 2020-04-02 ENCOUNTER — Other Ambulatory Visit: Payer: Self-pay | Admitting: Family

## 2020-04-02 DIAGNOSIS — R252 Cramp and spasm: Secondary | ICD-10-CM

## 2020-04-07 NOTE — Progress Notes (Signed)
Chief Complaint:   OBESITY Morgan Hurst is here to discuss her progress with her obesity treatment plan along with follow-up of her obesity related diagnoses.   Today's visit was #: 12 Starting weight: 213 lbs Starting date: 06/30/2019 Today's weight: 206 lbs Today's date: 04/01/2020 Total lbs lost to date: 7 lbs Body mass index is 38.92 kg/m.  Total weight loss percentage to date: -3.29%  Interim History:  Morgan Hurst says she recently finished prednisone.  She will be getting an MRI.  She is being followed by Dr. [name].  Current Meal Plan: keeping a food journal and adhering to recommended goals of 1000-1200 calories and 50 grams of protein for 70% of the time.  Current Exercise Plan: Increased walking. Current Anti-Obesity Medications: Ozempic. Side effects: None.  Assessment/Plan:   1. Metabolic syndrome Starting goal: Lose 7-10% of starting weight. She will continue to focus on protein-rich, low simple carbohydrate foods. We reviewed the importance of hydration, regular exercise for stress reduction, and restorative sleep.  We will continue to check lab work every 3 months, with 10% weight loss, or should any other concerns arise.  2. NAFLD (nonalcoholic fatty liver disease) NAFLD is an umbrella term that encompasses a disease spectrum that includes steatosis (fat) without inflammation, steatohepatitis (NASH; fat + inflammation in a characteristic pattern), and cirrhosis. Bland steatosis is felt to be a benign condition, with extremely low to no risk of progression to cirrhosis, whereas NASH can progress to cirrhosis. The mainstay of treatment of NAFLD includes lifestyle modification to achieve weight loss, at least 7% of current body weight. Low carbohydrate diets can be beneficial in improving NAFLD liver histology. Additionally, exercise, even the absence of weight loss can have beneficial effects on the patient's metabolic profile and liver health.   3. Chronic pain of multiple  sites Morgan Hurst is being followed by Dr. [name].  She recently finished a round of prednisone.    Plan:  Start diclofenac 75 mg twice daily for pain.  - Start diclofenac (VOLTAREN) 75 MG EC tablet; Take 1 tablet (75 mg total) by mouth 2 (two) times daily.  Dispense: 60 tablet; Refill: 0  4. Class 2 severe obesity with serious comorbidity and body mass index (BMI) of 39.0 to 39.9 in adult, unspecified obesity type ([place])  Course: Morgan Hurst is currently in the action stage of change. As such, her goal is to continue with weight loss efforts.   Nutrition goals: She has agreed to keeping a food journal and adhering to recommended goals of 1000-1200 calories and 65 grams of protein.   Exercise goals: Increase activity.  Behavioral modification strategies: increasing lean protein intake, decreasing simple carbohydrates, increasing vegetables and increasing water intake.  Morgan Hurst has agreed to follow-up with our clinic in 4 weeks. She was informed of the importance of frequent follow-up visits to maximize her success with intensive lifestyle modifications for her multiple health conditions.   Objective:   Blood pressure 110/76, pulse 88, temperature 97.6 F (36.4 C), temperature source Oral, height 5' 1"  (1.549 m), weight 206 lb (93.4 kg), SpO2 97 %. Body mass index is 38.92 kg/m.  General: Cooperative, alert, well developed, in no acute distress. HEENT: Conjunctivae and lids unremarkable. Cardiovascular: Regular rhythm.  Lungs: Normal work of breathing. Neurologic: No focal deficits.   Lab Results  Component Value Date   CREATININE 0.78 03/26/2020   BUN 21 03/26/2020   NA 142 03/26/2020   K 3.7 03/26/2020   CL 100 03/26/2020   CO2 35 (H)  03/26/2020   Lab Results  Component Value Date   ALT 14 03/26/2020   AST 16 03/26/2020   ALKPHOS 66 03/26/2020   BILITOT 0.4 03/26/2020   Lab Results  Component Value Date   HGBA1C 5.2 09/25/2019   HGBA1C 5.9 05/15/2019   HGBA1C 6.0 09/04/2018    HGBA1C 5.9 12/25/2017   HGBA1C 6.0 04/11/2017   Lab Results  Component Value Date   INSULIN 20.9 06/20/2019   Lab Results  Component Value Date   TSH 3.060 06/20/2019   Lab Results  Component Value Date   CHOL 182 03/26/2020   HDL 48.60 03/26/2020   LDLCALC 114 (H) 03/26/2020   LDLDIRECT 177.7 12/25/2012   TRIG 94.0 03/26/2020   CHOLHDL 4 03/26/2020   Lab Results  Component Value Date   WBC 7.8 06/20/2019   HGB 14.9 06/20/2019   HCT 44.5 06/20/2019   MCV 89 06/20/2019   PLT 189 06/20/2019   Lab Results  Component Value Date   IRON 59 09/07/2018   FERRITIN 87.1 09/07/2018   Obesity Behavioral Intervention:   Approximately 15 minutes were spent on the discussion below.  ASK: We discussed the diagnosis of obesity with Morgan Hurst today and Morgan Hurst agreed to give [place] permission to discuss obesity behavioral modification therapy today.  ASSESS: Jasline has the diagnosis of obesity and her BMI today is 39.0. Yeily is in the action stage of change.   Morgan Hurst was educated on the multiple health risks of obesity as well as the benefit of weight loss to improve her health. She was advised of the need for long term treatment and the importance of lifestyle modifications to improve her current health and to decrease her risk of future health problems.  AGREE: Multiple dietary modification options and treatment options were discussed and Morgan Hurst agreed to follow the recommendations documented in the above note.  ARRANGE: Morgan Hurst was educated on the importance of frequent visits to treat obesity as outlined per CMS and USPSTF guidelines and agreed to schedule her next follow up appointment today.  Attestation Statements:   Reviewed by clinician on day of visit: allergies, medications, problem list, medical history, surgical history, family history, social history, and previous encounter notes.  I, Water quality scientist, CMA, am acting as transcriptionist for Briscoe Deutscher, DO  I have  reviewed the above documentation for accuracy and completeness, and I agree with the above. Briscoe Deutscher, DO

## 2020-04-12 ENCOUNTER — Ambulatory Visit
Admission: RE | Admit: 2020-04-12 | Discharge: 2020-04-12 | Disposition: A | Discharge status: Home / Self Care | Payer: Medicare Other | Source: Ambulatory Visit | Attending: Family Medicine | Admitting: Family Medicine

## 2020-04-12 ENCOUNTER — Other Ambulatory Visit: Payer: Self-pay

## 2020-04-12 DIAGNOSIS — M545 Low back pain, unspecified: Secondary | ICD-10-CM

## 2020-04-12 DIAGNOSIS — M48061 Spinal stenosis, lumbar region without neurogenic claudication: Secondary | ICD-10-CM | POA: Diagnosis not present

## 2020-04-12 DIAGNOSIS — M542 Cervicalgia: Secondary | ICD-10-CM

## 2020-04-12 DIAGNOSIS — M4802 Spinal stenosis, cervical region: Secondary | ICD-10-CM | POA: Diagnosis not present

## 2020-04-14 DIAGNOSIS — K219 Gastro-esophageal reflux disease without esophagitis: Secondary | ICD-10-CM | POA: Diagnosis not present

## 2020-04-14 DIAGNOSIS — K5900 Constipation, unspecified: Secondary | ICD-10-CM | POA: Diagnosis not present

## 2020-04-14 DIAGNOSIS — E785 Hyperlipidemia, unspecified: Secondary | ICD-10-CM | POA: Diagnosis not present

## 2020-04-14 DIAGNOSIS — K760 Fatty (change of) liver, not elsewhere classified: Secondary | ICD-10-CM | POA: Diagnosis not present

## 2020-04-14 DIAGNOSIS — R7303 Prediabetes: Secondary | ICD-10-CM | POA: Diagnosis not present

## 2020-04-15 ENCOUNTER — Telehealth: Payer: Self-pay

## 2020-04-15 ENCOUNTER — Other Ambulatory Visit: Payer: Self-pay

## 2020-04-15 DIAGNOSIS — M48061 Spinal stenosis, lumbar region without neurogenic claudication: Secondary | ICD-10-CM

## 2020-04-15 DIAGNOSIS — M5412 Radiculopathy, cervical region: Secondary | ICD-10-CM

## 2020-04-15 NOTE — Telephone Encounter (Signed)
Patient called to ask about her MRI results. She said that she was told she could/should have an epidural. Patient was not sure what Dr. Darliss Ridgel plans were for the next steps. Would like a call back to discuss.

## 2020-04-15 NOTE — Telephone Encounter (Signed)
Spoke with patient. She would like to proceed w epidural injections. Ordered and patient will call to schedule.

## 2020-04-21 ENCOUNTER — Ambulatory Visit
Admission: RE | Admit: 2020-04-21 | Discharge: 2020-04-21 | Disposition: A | Discharge status: Home / Self Care | Payer: Medicare Other | Source: Ambulatory Visit | Attending: Family Medicine | Admitting: Family Medicine

## 2020-04-21 ENCOUNTER — Other Ambulatory Visit: Payer: Self-pay

## 2020-04-21 DIAGNOSIS — M47812 Spondylosis without myelopathy or radiculopathy, cervical region: Secondary | ICD-10-CM | POA: Diagnosis not present

## 2020-04-21 DIAGNOSIS — M5412 Radiculopathy, cervical region: Secondary | ICD-10-CM

## 2020-04-21 MED ORDER — IOPAMIDOL (ISOVUE-M 300) INJECTION 61%
1.0000 mL | Freq: Once | INTRAMUSCULAR | Status: AC | PRN
  Administered 2020-04-21: 1 mL via EPIDURAL

## 2020-04-21 MED ORDER — TRIAMCINOLONE ACETONIDE 40 MG/ML IJ SUSP (RADIOLOGY)
60.0000 mg | Freq: Once | INTRAMUSCULAR | Status: AC
  Administered 2020-04-21: 60 mg via EPIDURAL

## 2020-04-21 NOTE — Discharge Instructions (Signed)

## 2020-04-29 ENCOUNTER — Other Ambulatory Visit: Payer: Medicare Other

## 2020-05-04 ENCOUNTER — Other Ambulatory Visit: Payer: Self-pay | Admitting: Family

## 2020-05-04 ENCOUNTER — Ambulatory Visit (INDEPENDENT_AMBULATORY_CARE_PROVIDER_SITE_OTHER): Payer: Medicare Other | Admitting: Pharmacist

## 2020-05-04 DIAGNOSIS — Z6839 Body mass index (BMI) 39.0-39.9, adult: Secondary | ICD-10-CM

## 2020-05-04 DIAGNOSIS — E785 Hyperlipidemia, unspecified: Secondary | ICD-10-CM | POA: Diagnosis not present

## 2020-05-04 DIAGNOSIS — R7303 Prediabetes: Secondary | ICD-10-CM

## 2020-05-04 DIAGNOSIS — K760 Fatty (change of) liver, not elsewhere classified: Secondary | ICD-10-CM

## 2020-05-04 DIAGNOSIS — E8881 Metabolic syndrome: Secondary | ICD-10-CM

## 2020-05-04 NOTE — Patient Instructions (Signed)
  Visit Information  PATIENT GOALS: Goals Addressed              This Visit's Progress   .  Medication Monitoring (pt-stated)        Patient Goals/Self-Care Activities . Over the next 90 days, patient will:  - collaborate with provider on medication access solutions target a minimum of 150 minutes of moderate intensity exercise weekly engage in dietary modifications by continuing to reduce saturated fats, reducing carbohydrate portions.          Patient verbalizes understanding of instructions provided today and agrees to view in [place].   Telephone follow up appointment with care management team member scheduled for: ~5 weeks  Lorel Monaco, PharmD, [place]

## 2020-05-04 NOTE — Chronic Care Management (AMB) (Addendum)
Chronic Care Management Pharmacy Note  05/04/2020 Name:  Morgan Hurst MRN:  [id number] DOB:  02-07-1945  Subjective: Morgan Hurst is an 75 y.o. year old female who is a primary patient of Burnard Hawthorne, FNP.  The CCM team was consulted for assistance with disease management and care coordination needs.    Engaged with patient by telephone for follow up visit in response to provider referral for pharmacy case management and/or care coordination services.   Consent to Services:  The patient was given information about Chronic Care Management services, agreed to services, and gave verbal consent prior to initiation of services.  Please see initial visit note for detailed documentation.   Patient Care Team: Burnard Hawthorne, FNP as PCP - General (Family Medicine) Jackolyn Confer, MD (Internal Medicine) Robert Bellow, MD (General Surgery) De Hollingshead, RPH-CPP (Pharmacist)  Recent office visits:  3/15 with Joycelyn Schmid (PCP): Duloxetine 30 mg daily for chronic neck pain  Recent consult visits:  3/29 with [facility]: increased Nexium to 20 mg BID and Gas-X PRN for gas/indigestion and increased docusate to 100-200 mg twice daily, and if not improved, pt can add senna-S or Miralax daily for consitpation  3/15 with Dr. [name] (healthy weight management): current weight 206 lbs (~7 lb weight loss thus far); additionally, started diclofenac ER 75 mg BID for chronic pain  Hospital visits: None in previous 6 months  Objective:  Lab Results  Component Value Date   CREATININE 0.78 03/26/2020   CREATININE 0.64 11/13/2019   CREATININE 0.62 06/20/2019    Lab Results  Component Value Date   HGBA1C 5.2 09/25/2019   Last diabetic Eye exam: No results found for: HMDIABEYEEXA  Last diabetic Foot exam: No results found for: HMDIABFOOTEX      Component Value Date/Time   CHOL 182 03/26/2020 0950   TRIG 94.0 03/26/2020 0950   HDL 48.60 03/26/2020 0950    CHOLHDL 4 03/26/2020 0950   VLDL 18.8 03/26/2020 0950   LDLCALC 114 (H) 03/26/2020 0950   LDLDIRECT 177.7 12/25/2012 1619    Hepatic Function Latest Ref Rng & Units 03/26/2020 11/13/2019 06/20/2019  Total Protein 6.0 - 8.3 g/dL 7.3 7.4 7.2  Albumin 3.5 - 5.2 g/dL 3.9 4.0 4.2  AST 0 - 37 U/L 16 19 34  ALT 0 - 35 U/L 14 14 28   Alk Phosphatase 39 - 117 U/L 66 84 81  Total Bilirubin 0.2 - 1.2 mg/dL 0.4 0.4 0.5    Lab Results  Component Value Date/Time   TSH 3.060 06/20/2019 03:42 PM   TSH 3.26 12/25/2017 04:43 PM   FREET4 1.01 06/20/2019 03:42 PM    CBC Latest Ref Rng & Units 06/20/2019 09/04/2018 02/07/2018  WBC 3.4 - 10.8 x10E3/uL 7.8 8.4 7.5  Hemoglobin 11.1 - 15.9 g/dL 14.9 14.3 14.5  Hematocrit 34.0 - 46.6 % 44.5 43.6 43.7  Platelets 150 - 450 x10E3/uL 189 188.0 176.0    Lab Results  Component Value Date/Time   VD25OH 35.49 09/04/2018 08:47 AM   VD25OH 27.53 (L) 04/02/2018 08:17 AM    Clinical ASCVD: No  The 10-year ASCVD risk score Mikey Bussing DC Jr., et al., 2013) is: 37.7%   Values used to calculate the score:     Age: 66 years     Sex: Female     Is Non-Hispanic African American: No     Diabetic: Yes     Tobacco smoker: No     Systolic Blood Pressure: 284  mmHg     Is BP treated: No     HDL Cholesterol: 48.6 mg/dL     Total Cholesterol: 182 mg/dL    PFT (02/25/2020 [facility]) SPIROMETRY: FVC was 2.00 liters, 79% of predicted/Post 2.11, 83%, 5% Change FEV1 was 1.59, 80% of predicted/Post 1.72, 87%, 8% Change FEV1 ratio was 79/Post 82 FEF 25-75% liters per second was 86% of predicted/Post 103%, 19% Change *SMALL VOLUME NEBULIZER given with 2.5 mg Albuterol for Post Spirometry  LUNG VOLUMES: TLC was 86% of predicted RV was 81% of predicted  DIFFUSION CAPACITY: DLCO was 79% of predicted DLCO/VA was 157% of predicted  FLOW VOLUME LOOP:  Scooping of expiratory limb of flow volume loop suggestive of obstructive physiology.  Impression Mild isolated  decrement in DLCO with preserved spirometry and lung volumes.   Findings are due to obese body habitus.   Recommendation to lose 60lbs.   Social History   Tobacco Use  Smoking Status Never Smoker  Smokeless Tobacco Never Used   BP Readings from Last 3 Encounters:  04/21/20 (!) 171/87  04/01/20 110/76  03/31/20 124/68   Pulse Readings from Last 3 Encounters:  04/21/20 80  04/01/20 88  03/31/20 80   Wt Readings from Last 3 Encounters:  04/01/20 206 lb (93.4 kg)  03/31/20 212 lb 3.2 oz (96.3 kg)  03/24/20 217 lb (98.4 kg)    Assessment: Review of patient past medical history, allergies, medications, health status, including review of consultants reports, laboratory and other test data, was performed as part of comprehensive evaluation and provision of chronic care management services.   SDOH:  (Social Determinants of Health) assessments and interventions performed:  SDOH Interventions   Flowsheet Row Most Recent Value  SDOH Interventions   Financial Strain Interventions Other (Comment)  [manufacturer assistance]      CCM Care Plan  No Known Allergies  Medications Reviewed Today    Reviewed by Avie Arenas, RPH (Pharmacist) on 05/04/20 at 1001  Med List Status: <None>  Medication Order Taking? Sig Documenting Provider Last Dose Status Informant  acetaminophen (TYLENOL) 650 MG CR tablet [id number] Yes Take 650 mg by mouth every 8 (eight) hours as needed for pain. [provider] Taking Active            Med Note Mayo Ao Feb 17, 2020 10:54 AM) Taking 650 mg BID  calcium carbonate (OSCAL) 1500 (600 Ca) MG TABS tablet [id number] Yes Take 600 mg by mouth daily. [provider] Taking Active   cholecalciferol (VITAMIN D3) 25 MCG (1000 UNIT) tablet [id number] Yes Take 2,000 Units by mouth daily. [provider] Taking Active   colesevelam [facility]) 625 MG tablet [id number] Yes Take 625 mg by mouth daily as needed. [provider] Taking Active            Med Note Mayo Ao Feb 17, 2020 11:02 AM) Taking PRN diarrhea w/ nexium  cyanocobalamin (,VITAMIN B-12,) 1000 MCG/ML injection [id number] Yes INJECT 1 ML ONCE PER MONTH Burnard Hawthorne, FNP Taking Active   diclofenac (VOLTAREN) 75 MG EC tablet [id number] No Take 1 tablet (75 mg total) by mouth 2 (two) times daily.  Patient not taking: Reported on 05/04/2020   Briscoe Deutscher, DO Not Taking Active   DULoxetine (CYMBALTA) 30 MG capsule [id number] No Take one 30 mg tablet by mouth once a day for the first week. Then increase to two 30 mg tablets ( total 50m) by  mouth once daily.  Patient not taking: Reported on 05/04/2020   Burnard Hawthorne, FNP Not Taking Active   esomeprazole (NEXIUM) 40 MG capsule [id number] Yes Take by mouth. [provider] Taking Active            Med Note Romeo Rabon Apr 04, 2018  3:09 PM) PRN  gabapentin (NEURONTIN) 100 MG capsule [id number] Yes Take two capsules (287m total) daily at bedtime SLyndal Pulley DO Taking Active            Med Note (Mayo AoFeb 28, 2022 10:38 AM) Taking 1 QPM  magnesium oxide (MAG-OX) 400 MG tablet [id number] Take 400 mg by mouth daily. [provider] Taking Active   potassium chloride (KLOR-CON) 10 MEQ tablet [id number] Take 1 tablet by mouth twice daily. **Take with Metolazone.*Briscoe Deutscher DO Taking Active            Med Note (Mayo AoFeb 28, 2022 10:40 AM) 1 daily   rOPINIRole (REQUIP) 0.25 MG tablet [id number] TAKE 1 TABLET(0.25 MG) BY MOUTH AT BEDTIME AS NEEDED ABurnard Hawthorne FNP Taking Active   Semaglutide,0.25 or 0.5MG/DOS, (OZEMPIC, 0.25 OR 0.5 MG/DOSE,) 2 MG/1.5ML SOPN [id number] Inject 0.5 mg into the skin once a week. Inject 0.5 mg into skin weekly [provider] Taking Active            Med Note (Aurthur Wingerter A   Mon May 04, 2020 10:00 AM) Taking 1 mg weekly  tiZANidine (ZANAFLEX) 2 MG  tablet [id number] Take 1-2 tablets (2-4 mg total) by mouth every 8 (eight) hours as needed for muscle spasms.  Patient not taking: Reported on 05/04/2020   CGregor Hams MD Not Taking Active   torsemide ([facility] 20 MG tablet [id number] Take 20 mg by mouth daily as needed. [provider] Taking Active            Med Note (Darnelle Maffucci CWaynette ButteryFeb 28, 2022 10:40 AM) Taking most days, skipping when she has somewhere to go; about every other day  traMADol (ULTRAM) 50 MG tablet [id number] TAKE 1 TABLET BY MOUTH DAILY AS NEEDED ABurnard Hawthorne FNP Taking Active            Med Note (Mayo AoFeb 28, 2022 10:43 AM) Taking 1/2 -1 daily          Patient Active Problem List   Diagnosis Date Noted  . Aspirin long-term use 04/01/2020  . Obesity 04/01/2020  . Cough 01/01/2020  . Dyspnea 03/13/2019  . Osteoporosis 02/14/2019  . Weakness of both lower extremities 08/24/2018  . Greater trochanteric bursitis, left 04/24/2018  . Vitamin D deficiency 04/06/2018  . Trigger point of left shoulder region 12/26/2017  . Frozen shoulder 11/14/2017  . GERD (gastroesophageal reflux disease) 03/17/2017  . Greater trochanteric bursitis of right hip 09/07/2016  . Chronic midline low back pain without sciatica 04/25/2016  . Fatty liver disease, nonalcoholic 135/32/9924 . Nodule of left lung 11/25/2015  . Prediabetes 08/24/2015  . Muscle cramp 02/05/2015  . B12 deficiency 02/05/2015  . Neck pain 12/08/2014  . Trapezius muscle spasm 11/20/2014  . Nonallopathic lesion of thoracic region 11/20/2014  . Trigger thumb 10/23/2014  . Pain of right thumb 10/07/2014  . Spinal stenosis, lumbar region, with neurogenic claudication 08/01/2014  . Hemorrhoid 06/18/2014  . Postmenopausal estrogen  deficiency 03/18/2014  . Skin lesion 03/18/2014  . Severe obesity (BMI >= 40) ([place]) 02/14/2014  . Myalgia and myositis 02/14/2014  . Arthralgia 12/25/2012  . Pernicious anemia  12/25/2012  . Memory loss 12/25/2012  . Hyperlipidemia 01/25/2012    Immunization History  Administered Date(s) Administered  . Fluad Quad(high Dose 65+) 01/01/2020  . Hepb-cpg 09/05/2019, 09/25/2019  . Influenza Split 12/26/2011  . Influenza, High Dose Seasonal PF 11/25/2015, 10/13/2016, 09/26/2017, 11/30/2018  . Influenza,inj,Quad PF,6+ Mos 12/25/2012, 03/18/2014, 10/07/2014  . Pneumococcal Conjugate-13 03/18/2014  . Pneumococcal Polysaccharide-23 12/26/2011  . Tdap 12/25/2009  . Zoster 12/26/2010    Conditions to be addressed/monitored: HLD, obesity, prediabetes, chronic pain, NAFLD, Osteoporosis  Care Plan : Medication Management  Updates made by Ekaterini Capitano A, RPH since 05/04/2020 12:00 AM    Problem: Weight Management, Pre-Diabetes, NAFLD, Osteoporosis     Long-Range Goal: Disease Progression Prevention   This Visit's Progress: On track  Recent Progress: On track  Priority: High  Note:   Current Barriers:  . Unable to independently afford treatment regimen . Unable to independently achieve control of weight   Pharmacist Clinical Goal(s):  Marland Kitchen Over the next 90 days, patient will verbalize ability to afford treatment regimen . Over the next 90 days, patient will achieve improvement in weight   Interventions: . 1:1 collaboration with Burnard Hawthorne, FNP regarding development and update of comprehensive plan of care as evidenced by provider attestation and co-signature . Inter-disciplinary care team collaboration (see longitudinal plan of care) . Comprehensive medication review performed; medication list updated in electronic medical record  Obesity, Diabetes, Impaired Fasting Glucose: Marland Kitchen Diabetes at goal, but unable to achieve goal weight loss of 5-10% with diet and exercise alone; current treatment: Ozempic 1 mg weekly (Fridays) o Currently taking 2 doses of Ozempic 0.5 mg for a total of 1 mg weekly on Fridays.  o Requested Ozempic (1 mg/dose) formulation from  patient assistance  . Denies GI upset since increasing to 1 mg.   . Baseline weight: 213 lbs . Not checking weight daily but last checked was 207 lbs on 04/01/20 (~7 lb weight loss thus far) . Pulmonology recently performed PFT, changes related to body habitus, recommended a total weight loss of 60 lbs.  . Working with [facility] Dr. [name]; goal of 1200 cal daily and 95 g protein . Reviewed to continue Ozempic 1 mg weekly. Reviewed 2 mg dose will be an option in the future. Patient verbalized understanding . Will plan to resubmit Ozempic patient assistance application from 1 mg weekly. . Encouraged continued collaboration with Dr. [name]  Arthritis, spinal stenosis, hip pain, cervicular radiculopathy . Uncontrolled per patient report. Current treatment: tramadol 50 mg daily, 1/2-1 tab daily; APAP 650 mg BID, gabapentin 200 mg QPM - though using 100 mg QPM every evening, diclofenac ER 75 mg BID (not started), duloxetine 30 mg daily (not taking). No mention of increased sedation. o Recently prescribed duloxetine 30 mg daily by Joycelyn Schmid (PCP) on 3/15. Pt reports Dr. [name] recommended patient to not start medication and instead started diclofenac ER 75 mg BID on 3/16, however pt reports not starting due to fear of side effects listed on package insert. Discussed taking diclofenac as needed instead of chronically reduces risks of GI bleeds and cardiac conditions. Patient verbalized understanding. Marland Kitchen MRI on 3/27 revealed multilevel degenerative changes of the lumbar spine with severe spinal canal stenosis at L4-L5 and mild spinal canal stenosis at L2-L3 and L3-L4. Findings have progressed from  previous MRI. Marland Kitchen Recommended to continue current regimen in collaboration with Dr. Caesar Bookman (PCP), and Dr. [name]  ASCVD Risk Reduction: . No statin regimen at this time. 10 year ASCVD risk >7.5%. Previously on atorvastatin, but wished to discontinue d/t the associated risk of diabetes.  Reports of joint/muscle pains at baseline in other visits.   . Antiplatelet regimen: aspirin 81 mg daily . Discussed starting rosuvastatin 5 mg daily as recommended by Joycelyn Schmid (PCP). Patient amendable to start.  . Will collaborate with PCP to send prescription for rosuvastatin 5 mg daily  Osteoporosis: . Current treatment: declined bisphosphonate. Calcium 600 mg + Vitamin D 2000 units daily . T score 01/2019: -2.9.  . Recommend to continue current regimen at this time. Strongly recommend further conversations in the future about reduction in risk of risk of hip and vertebral fractures with treatment.   GI concerns: Marland Kitchen Managed by [facility]; NAFLD w/o scarring/fibrosis. Improved with continued weight loss.  Marland Kitchen PRN use of colesevelam (diarrhea), docusate (constipation), ondansetron (nausea), esomeprazole 40 mg (GERD). Infrequent use. Reports that she was prescribed colesevelam to treat diarrhea that is caused by esomeprazole. . Recommend to continue current PRN symptom regimen at this time, given infrequent use. Recommend to continue to collaborated with GI.   Edema w/ SOB: . Managed, current regimen: Dr. Montel Culver changed metolazone to torsemide 20 mg daily, though patient is skipping days that she is going out so is taking about every other day. Taking potassium 10 mg on the days she takes torsemide.  . Baseline potassium on low end of normal per [facility] labs.  . Most recent PFTs: Pre FEV1 1.59, FVC 2.0; FEV1/FVC: 79%; post FEV1 1.72, FVC 2.11; post FEV1/FVC 82%. No mention of obstructive or restrictive disease, breathing changes related to body habitus . Continue current regimen at this time along with collaboration with multidisciplinary team.  Patient Goals/Self-Care Activities . Over the next 90 days, patient will:  - collaborate with provider on medication access solutions target a minimum of 150 minutes of moderate intensity exercise weekly engage in dietary modifications by  continuing to reduce saturated fats, reducing carbohydrate portions  Follow Up Plan: Telephone follow up appointment with care management team member scheduled for: ~ 5 weeks       Medication Assistance: [place] obtained through [employer] medication assistance program.  Enrollment ends 12/16/20  Patient's preferred pharmacy is:  [facility] [place], [place] [address] [place] [address] Phone: [phone] Fax: [facility], [place] [address] [place] [place] [address] Phone: [phone] Fax: [phone]  Follow Up:  Patient agrees to Care Plan and Follow-up.  Plan: Telephone follow up appointment with care management team member scheduled for:  ~5 weeks  Lorel Monaco, PharmD, BCPS PGY2 [place]   I was present for this visit and agree with the documentation by the resident as above.   Catie Darnelle Maffucci, PharmD, [place], CPP Clinical Pharmacist [place] [facility] [phone] Encounter details: CCM Time Spent      Value Time User   Time spent with patient (minutes)  37 05/04/2020 12:06 PM De Hollingshead, RPH-CPP   Time spent performing Chart review  5 05/04/2020 11:11 AM Hibba Schram A, RPH   Total time (minutes)  42 05/04/2020 12:06 PM De Hollingshead, RPH-CPP     Moderate to High Complex Decision Making      Value Time User   Moderate to High complex decision making  No 05/04/2020 11:11 AM Jules Baty, Henreitta Leber, RPH     CCM Services: This encounter meets routine CCM services.  Prior to outreach and patient consent for Chronic Care Management, I referred this patient for services after reviewing the nominated patient list or from a personal encounter with the patient.  I have personally reviewed this encounter including the documentation in this note and  have collaborated with the care management provider regarding care management and care coordination activities to include development and update of the comprehensive care plan. I am certifying that I agree with the content of this note and encounter as supervising physician.  Mable Paris, NP

## 2020-05-11 ENCOUNTER — Ambulatory Visit
Admission: RE | Admit: 2020-05-11 | Discharge: 2020-05-11 | Disposition: A | Discharge status: Home / Self Care | Payer: Medicare Other | Source: Ambulatory Visit | Attending: Family Medicine | Admitting: Family Medicine

## 2020-05-11 DIAGNOSIS — M48061 Spinal stenosis, lumbar region without neurogenic claudication: Secondary | ICD-10-CM

## 2020-05-11 DIAGNOSIS — M47816 Spondylosis without myelopathy or radiculopathy, lumbar region: Secondary | ICD-10-CM | POA: Diagnosis not present

## 2020-05-11 MED ORDER — METHYLPREDNISOLONE ACETATE 40 MG/ML INJ SUSP (RADIOLOG
120.0000 mg | Freq: Once | INTRAMUSCULAR | Status: AC
  Administered 2020-05-11: 80 mg via EPIDURAL

## 2020-05-11 MED ORDER — IOPAMIDOL (ISOVUE-M 200) INJECTION 41%
1.0000 mL | Freq: Once | INTRAMUSCULAR | Status: AC
  Administered 2020-05-11: 1 mL via EPIDURAL

## 2020-05-11 NOTE — Discharge Instructions (Signed)

## 2020-05-13 ENCOUNTER — Telehealth: Payer: Self-pay

## 2020-05-13 NOTE — Chronic Care Management (AMB) (Signed)
  Care Management   Note  05/13/2020 Name: Morgan Hurst MRN: [id number] DOB: June 28, 1945  Morgan Hurst is a 75 y.o. year old female who is a primary care patient of Burnard Hawthorne, FNP and is actively engaged with the care management team. I reached out to Morgan Hurst by phone today to assist with re-scheduling a follow up visit with the Pharmacist  Follow up plan: The care management team will reach out to the patient again over the next 1 days.  If patient returns call to provider office, please advise to call [facility]  at [place], [place], [place], [place] [address] Direct Dial: [phone] [email or web address] Website: [email or web address]

## 2020-05-14 ENCOUNTER — Telehealth (INDEPENDENT_AMBULATORY_CARE_PROVIDER_SITE_OTHER): Payer: Medicare Other | Admitting: Family Medicine

## 2020-05-14 ENCOUNTER — Telehealth: Payer: Self-pay

## 2020-05-14 NOTE — Telephone Encounter (Signed)
I called and confirmed with patient that she is taking the 24m weekly of Ozempic. After asking Catie to confirm after reading notes, I have filled out patient assistance for 163mof Ozempic to be sent to patient from [employer] advised since patient stated that she only has enough left in her own supply for one week that [place] until she can get her patient assistance shipment that should be received in the next 10-14 business days.    I have labeled the one sample Ozempic pen for patient that Catie signed out. It is placed in our fridge & patient will pick up sometime tomorrow 4/29 or early next week.

## 2020-05-18 ENCOUNTER — Encounter (INDEPENDENT_AMBULATORY_CARE_PROVIDER_SITE_OTHER): Payer: Self-pay | Admitting: Adult Health

## 2020-05-18 ENCOUNTER — Other Ambulatory Visit: Payer: Self-pay

## 2020-05-18 ENCOUNTER — Ambulatory Visit (INDEPENDENT_AMBULATORY_CARE_PROVIDER_SITE_OTHER): Payer: Medicare Other | Admitting: Adult Health

## 2020-05-18 VITALS — BP 126/75 | HR 84 | Temp 98.3°F | Ht 61.0 in | Wt 209.0 lb

## 2020-05-18 DIAGNOSIS — Z6839 Body mass index (BMI) 39.0-39.9, adult: Secondary | ICD-10-CM

## 2020-05-18 DIAGNOSIS — K5900 Constipation, unspecified: Secondary | ICD-10-CM

## 2020-05-18 DIAGNOSIS — R7303 Prediabetes: Secondary | ICD-10-CM

## 2020-05-19 ENCOUNTER — Encounter: Payer: Medicare Other | Admitting: Family

## 2020-05-19 ENCOUNTER — Ambulatory Visit: Payer: Medicare Other | Admitting: Family

## 2020-05-19 ENCOUNTER — Telehealth: Payer: Self-pay

## 2020-05-19 NOTE — Telephone Encounter (Signed)
LMTCB to reschedule today's appointment.

## 2020-05-19 NOTE — Progress Notes (Signed)
Chief Complaint:   OBESITY Morgan Hurst is here to discuss her progress with her obesity treatment plan along with follow-up of her obesity related diagnoses. Morgan Hurst is on keeping a food journal and adhering to recommended goals of 1100-1200 calories and 65 g protein and states she is following her eating plan approximately 0% of the time. Morgan Hurst states she is not currently exercising.  Today's visit was #: 39 Starting weight: 213 lbs Starting date: 06/30/2019 Today's weight: 209 lbs Today's date: 05/18/2020 Total lbs lost to date: 4 Total lbs lost since last in-office visit: 0  Interim History: Morgan Hurst recently increased Ozempic to 1 mg once a week. She has had 3 injections at the new dose. Pt reports she has experienced constipation the last month.  Typical daily intake: Breakfast- 45 cal bread, peanut butter, banana, juice or coffee Snack- cake or fruit Lunch- bacon and lettuce sandwich, or another type of meat protein sandwich Dinner- meat protein and vegetable  Subjective:   1. Constipation, unspecified constipation type Morgan Hurst reports daily BM as normal bowel habits most of her life.  Then the last month, BM once every 3 days.  She was seen by her GI and was instructed to start a OTC stool softener and OTC Senna. She denies abdominal pain or hematochezia.  2. Pre-diabetes Morgan Hurst continues to crave carbs. She recently increased Ozempic to 1 mg once week. And experienced constipation with increase in dose.  Lab Results  Component Value Date   HGBA1C 5.2 09/25/2019   Lab Results  Component Value Date   INSULIN 20.9 06/20/2019    Assessment/Plan:   1. Constipation, unspecified constipation type [name] was informed that a decrease in bowel movement frequency is normal while losing weight, but stools should not be hard or painful. Orders and follow up as documented in patient record. Continue OTC stool softener and OTC Senna.  Counseling Getting to Good Bowel Health: Your goal is  to have one soft bowel movement each day. Drink at least 8 glasses of water each day. Eat plenty of fiber (goal is over 25 grams each day). It is best to get most of your fiber from dietary sources which includes leafy green vegetables, fresh fruit, and whole grains. You may need to add fiber with the help of OTC fiber supplements. These include Metamucil, Citrucel, and Flaxseed. If you are still having trouble, try adding Miralax.  F/u with GI PRN.  2. Pre-diabetes Morgan Hurst will continue to work on weight loss, exercise, and decreasing simple carbohydrates to help decrease the risk of diabetes. Increase protein intake. Check labs at next OV.  3. Class 2 severe obesity with serious comorbidity and body mass index (BMI) of 39.0 to 39.9 in adult, unspecified obesity type (HCC)  Morgan Hurst is currently in the action stage of change. As such, her goal is to continue with weight loss efforts. She has agreed to Low carbohydrate/High Protein plan.   Continue Ozempic 1 mg once a week. Fasting labs at next OV.  Exercise goals: No exercise has been prescribed at this time.  Behavioral modification strategies: increasing lean protein intake, decreasing simple carbohydrates, no skipping meals, meal planning and cooking strategies and planning for success.  Morgan Hurst has agreed to follow-up with our clinic in 4 weeks. She was informed of the importance of frequent follow-up visits to maximize her success with intensive lifestyle modifications for her multiple health conditions.   Objective:   Blood pressure 126/75, pulse 84, temperature 98.3 F (36.8 C), height 5'  1" (1.549 m), weight 209 lb (94.8 kg), SpO2 97 %. Body mass index is 39.49 kg/m.  General: Cooperative, alert, well developed, in no acute distress. HEENT: Conjunctivae and lids unremarkable. Cardiovascular: Regular rhythm.  Lungs: Normal work of breathing. Neurologic: No focal deficits.   Lab Results  Component Value Date   CREATININE 0.78  03/26/2020   BUN 21 03/26/2020   NA 142 03/26/2020   K 3.7 03/26/2020   CL 100 03/26/2020   CO2 35 (H) 03/26/2020   Lab Results  Component Value Date   ALT 14 03/26/2020   AST 16 03/26/2020   ALKPHOS 66 03/26/2020   BILITOT 0.4 03/26/2020   Lab Results  Component Value Date   HGBA1C 5.2 09/25/2019   HGBA1C 5.9 05/15/2019   HGBA1C 6.0 09/04/2018   HGBA1C 5.9 12/25/2017   HGBA1C 6.0 04/11/2017   Lab Results  Component Value Date   INSULIN 20.9 06/20/2019   Lab Results  Component Value Date   TSH 3.060 06/20/2019   Lab Results  Component Value Date   CHOL 182 03/26/2020   HDL 48.60 03/26/2020   LDLCALC 114 (H) 03/26/2020   LDLDIRECT 177.7 12/25/2012   TRIG 94.0 03/26/2020   CHOLHDL 4 03/26/2020   Lab Results  Component Value Date   WBC 7.8 06/20/2019   HGB 14.9 06/20/2019   HCT 44.5 06/20/2019   MCV 89 06/20/2019   PLT 189 06/20/2019   Lab Results  Component Value Date   IRON 59 09/07/2018   FERRITIN 87.1 09/07/2018    Attestation Statements:   Reviewed by clinician on day of visit: allergies, medications, problem list, medical history, surgical history, family history, social history, and previous encounter notes.  Time spent on visit including pre-visit chart review and post-visit care and charting was 32 minutes.   Coral Ceo, am acting as Location manager for Mina Marble, NP.  I have reviewed the above documentation for accuracy and completeness, and I agree with the above. -  Charnele Semple d. Cleavon Goldman, NP-C

## 2020-05-20 NOTE — Progress Notes (Signed)
This encounter was created in error - please disregard.

## 2020-05-24 ENCOUNTER — Other Ambulatory Visit: Payer: Self-pay | Admitting: Family

## 2020-05-24 DIAGNOSIS — R252 Cramp and spasm: Secondary | ICD-10-CM

## 2020-05-26 ENCOUNTER — Telehealth: Payer: Self-pay

## 2020-05-26 NOTE — Telephone Encounter (Signed)
I called patient to let her know that we received her 4 boxes of Ozempic from [place]. Patient stated that she would be by to pick up this afternoon or tomorrow. Placed in [place].

## 2020-06-04 ENCOUNTER — Telehealth: Payer: Self-pay | Admitting: Family

## 2020-06-04 NOTE — Telephone Encounter (Signed)
Patient assistance medication received labeled and in sample refrigerator in POB B. Patient notified to pick up medication , patient willpick up on Friday.

## 2020-06-05 NOTE — Telephone Encounter (Signed)
Patient came in office today & picked up medication Ozempic.

## 2020-06-08 ENCOUNTER — Telehealth: Payer: Medicare Other

## 2020-06-10 ENCOUNTER — Ambulatory Visit (INDEPENDENT_AMBULATORY_CARE_PROVIDER_SITE_OTHER): Payer: Medicare Other | Admitting: Pharmacist

## 2020-06-10 DIAGNOSIS — M818 Other osteoporosis without current pathological fracture: Secondary | ICD-10-CM

## 2020-06-10 DIAGNOSIS — E785 Hyperlipidemia, unspecified: Secondary | ICD-10-CM | POA: Diagnosis not present

## 2020-06-10 DIAGNOSIS — R7301 Impaired fasting glucose: Secondary | ICD-10-CM

## 2020-06-10 DIAGNOSIS — K760 Fatty (change of) liver, not elsewhere classified: Secondary | ICD-10-CM

## 2020-06-10 DIAGNOSIS — Z6841 Body Mass Index (BMI) 40.0 and over, adult: Secondary | ICD-10-CM

## 2020-06-10 DIAGNOSIS — E8881 Metabolic syndrome: Secondary | ICD-10-CM

## 2020-06-10 MED ORDER — ROSUVASTATIN CALCIUM 5 MG PO TABS: ORAL_TABLET | ORAL | 1 refills | Status: DC

## 2020-06-10 NOTE — Chronic Care Management (AMB) (Addendum)
Chronic Care Management Pharmacy Note  06/10/2020 Name:  Morgan Hurst MRN:  [id number] DOB:  1945/02/15  Subjective: Morgan Hurst is an 75 y.o. year old female who is a primary patient of Burnard Hawthorne, FNP.  The CCM team was consulted for assistance with disease management and care coordination needs.    Engaged with patient by telephone for follow up visit in response to provider referral for pharmacy case management and/or care coordination services.   Consent to Services:  The patient was given information about Chronic Care Management services, agreed to services, and gave verbal consent prior to initiation of services.  Please see initial visit note for detailed documentation.   Patient Care Team: Burnard Hawthorne, FNP as PCP - General (Family Medicine) Jackolyn Confer, MD (Internal Medicine) Robert Bellow, MD (General Surgery) De Hollingshead, RPH-CPP (Pharmacist)  Recent office visits: None recently  Recent consult visits:  5/2 - weight management - use stool softener/senna  Hospital visits: None in previous 6 months  Objective:  Lab Results  Component Value Date   CREATININE 0.78 03/26/2020   CREATININE 0.64 11/13/2019   CREATININE 0.62 06/20/2019    Lab Results  Component Value Date   HGBA1C 5.2 09/25/2019   Last diabetic Eye exam: No results found for: HMDIABEYEEXA  Last diabetic Foot exam: No results found for: HMDIABFOOTEX      Component Value Date/Time   CHOL 182 03/26/2020 0950   TRIG 94.0 03/26/2020 0950   HDL 48.60 03/26/2020 0950   CHOLHDL 4 03/26/2020 0950   VLDL 18.8 03/26/2020 0950   LDLCALC 114 (H) 03/26/2020 0950   LDLDIRECT 177.7 12/25/2012 1619    Hepatic Function Latest Ref Rng & Units 03/26/2020 11/13/2019 06/20/2019  Total Protein 6.0 - 8.3 g/dL 7.3 7.4 7.2  Albumin 3.5 - 5.2 g/dL 3.9 4.0 4.2  AST 0 - 37 U/L 16 19 34  ALT 0 - 35 U/L _0 Alk Phosphatase 39 - 117 U/L 66 84 81  Total Bilirubin 0.2  - 1.2 mg/dL 0.4 0.4 0.5    Lab Results  Component Value Date/Time   TSH 3.060 06/20/2019 03:42 PM   TSH 3.26 12/25/2017 04:43 PM   FREET4 1.01 06/20/2019 03:42 PM    CBC Latest Ref Rng & Units 06/20/2019 09/04/2018 02/07/2018  WBC 3.4 - 10.8 x10E3/uL 7.8 8.4 7.5  Hemoglobin 11.1 - 15.9 g/dL 14.9 14.3 14.5  Hematocrit 34.0 - 46.6 % 44.5 43.6 43.7  Platelets 150 - 450 x10E3/uL 189 188.0 176.0    Lab Results  Component Value Date/Time   VD25OH 35.49 09/04/2018 08:47 AM   VD25OH 27.53 (L) 04/02/2018 08:17 AM    Clinical ASCVD: No  The 10-year ASCVD risk score Mikey Bussing DC Jr., et al., 2013) is: 25.2%   Values used to calculate the score:     Age: 52 years     Sex: Female     Is Non-Hispanic African American: No     Diabetic: Yes     Tobacco smoker: No     Systolic Blood Pressure: 480 mmHg     Is BP treated: No     HDL Cholesterol: 48.6 mg/dL     Total Cholesterol: 182 mg/dL    Other: (CHADS2VASc if Afib, PHQ9 if depression, MMRC or CAT for COPD, ACT, DEXA)  Social History   Tobacco Use  Smoking Status Never Smoker  Smokeless Tobacco Never Used   BP Readings from Last 3 Encounters:  05/18/20 126/75  05/11/20 124/60  04/21/20 (!) 171/87   Pulse Readings from Last 3 Encounters:  05/18/20 84  05/11/20 73  04/21/20 80   Wt Readings from Last 3 Encounters:  05/18/20 209 lb (94.8 kg)  04/01/20 206 lb (93.4 kg)  03/31/20 212 lb 3.2 oz (96.3 kg)    Assessment: Review of patient past medical history, allergies, medications, health status, including review of consultants reports, laboratory and other test data, was performed as part of comprehensive evaluation and provision of chronic care management services.   SDOH:  (Social Determinants of Health) assessments and interventions performed:  SDOH Interventions   Flowsheet Row Most Recent Value  SDOH Interventions   Financial Strain Interventions Other (Comment)  [manufacturer assistance]      CCM Care Plan  No Known  Allergies  Medications Reviewed Today    Reviewed by De Hollingshead, RPH-CPP (Pharmacist) on 06/10/20 at 72  Med List Status: <None>  Medication Order Taking? Sig Documenting Provider Last Dose Status Informant  acetaminophen (TYLENOL) 650 MG CR tablet [id number] Yes Take 650 mg by mouth every 8 (eight) hours as needed for pain. [provider] Taking Active            Med Note Mayo Ao Feb 17, 2020 10:54 AM) Taking 650 mg BID  calcium carbonate (OSCAL) 1500 (600 Ca) MG TABS tablet [id number] Yes Take 600 mg by mouth daily. [provider] Taking Active   cholecalciferol (VITAMIN D3) 25 MCG (1000 UNIT) tablet [id number] Yes Take 2,000 Units by mouth daily. [provider] Taking Active   colesevelam (WELCHOL) 625 MG tablet [id number] No Take 625 mg by mouth daily as needed.  Patient not taking: Reported on 06/10/2020   [provider] Not Taking Active            Med Note Mayo Ao Feb 17, 2020 11:02 AM) Taking PRN diarrhea w/ nexium  cyanocobalamin (,VITAMIN B-12,) 1000 MCG/ML injection [id number] Yes INJECT 1 ML ONCE PER MONTH Burnard Hawthorne, FNP Taking Active         Discontinued 99/83/38 2505 (Duplicate)   diclofenac (VOLTAREN) 75 MG EC tablet [id number] Yes Take 1 tablet (75 mg total) by mouth 2 (two) times daily. Briscoe Deutscher, DO Taking Active            Med Note ([place]   Wed Jun 10, 2020  3:36 PM) Taking ~1 dose every few days  DULoxetine (CYMBALTA) 30 MG capsule [id number] No Take one 30 mg tablet by mouth once a day for the first week. Then increase to two 30 mg tablets ( total 28m) by mouth once daily.  Patient not taking: Reported on 06/10/2020   ABurnard Hawthorne FNP Not Taking Active   esomeprazole (NEXIUM) 40 MG capsule [id number] Take by mouth. [provider] Taking Active            Med Note (Romeo RabonMar 18, 2020  3:09 PM) PRN  gabapentin (NEURONTIN) 100 MG  capsule [id number] Take two capsules (2059mtotal) daily at bedtime SmLyndal PulleyDO Taking Active            Med Note (TDarnelle MaffucciCALavonna Ruaay 25, 2022  3:37 PM) Taking ~ once every few days  magnesium oxide (MAG-OX) 400 MG tablet [id number] Take 400 mg by mouth daily. [provider] Taking Active   metolazone (ZAROXOLYN)  5 MG tablet [id number] Yes Take 5 mg by mouth daily. [provider] Taking Active            Med Note Darnelle Maffucci, Lavonna Rua Jun 10, 2020  3:33 PM) Taking metolazone ~ 3-4 days a week, not the day the takes torsemide  ondansetron (ZOFRAN-ODT) 4 MG disintegrating tablet [id number] No Take 4 mg by mouth as needed.  Patient not taking: Reported on 06/10/2020   [provider] Not Taking Active   potassium chloride (KLOR-CON) 10 MEQ tablet [id number] Yes Take 1 tablet by mouth twice daily. **Take with Metolazone.Briscoe Deutscher, DO Taking Active            Med Note Mayo Ao Mar 16, 2020 10:40 AM) 1 daily   rOPINIRole (REQUIP) 0.25 MG tablet [id number] Yes TAKE 1 TABLET(0.25 MG) BY MOUTH AT BEDTIME AS NEEDED Burnard Hawthorne, FNP Taking Active   Semaglutide, 1 MG/DOSE, (OZEMPIC, 1 MG/DOSE,) 4 MG/3ML SOPN [id number] Yes Inject 1 mg into the skin once a week. [provider] Taking Active   tiZANidine (ZANAFLEX) 2 MG tablet [id number] No Take 1-2 tablets (2-4 mg total) by mouth every 8 (eight) hours as needed for muscle spasms.  Patient not taking: Reported on 06/10/2020   Gregor Hams, MD Not Taking Active   torsemide [facility]) 20 MG tablet [id number] Yes Take 20 mg by mouth daily as needed. [provider] Taking Active            Med Note Kelby Aline Jun 10, 2020  3:33 PM) Takes 1-2 times weekly when swelling is significant  traMADol (ULTRAM) 50 MG tablet [id number] Yes TAKE 1 TABLET BY MOUTH DAILY AS NEEDED Burnard Hawthorne, FNP Taking Active            Med Note De Hollingshead    Wed Jun 10, 2020  3:39 PM) Taking ~ once weekly  Vitamin E 400 units TABS [id number] Yes Take 400 Units by mouth daily. [provider] Taking Active           Patient Active Problem List   Diagnosis Date Noted  . Constipation 05/18/2020  . Aspirin long-term use 04/01/2020  . Obesity 04/01/2020  . Cough 01/01/2020  . Dyspnea 03/13/2019  . Osteoporosis 02/14/2019  . Weakness of both lower extremities 08/24/2018  . Greater trochanteric bursitis, left 04/24/2018  . Vitamin D deficiency 04/06/2018  . Trigger point of left shoulder region 12/26/2017  . Frozen shoulder 11/14/2017  . GERD (gastroesophageal reflux disease) 03/17/2017  . Greater trochanteric bursitis of right hip 09/07/2016  . Chronic midline low back pain without sciatica 04/25/2016  . Fatty liver disease, nonalcoholic 34/19/3790  . Nodule of left lung 11/25/2015  . Prediabetes 08/24/2015  . Muscle cramp 02/05/2015  . B12 deficiency 02/05/2015  . Neck pain 12/08/2014  . Trapezius muscle spasm 11/20/2014  . Nonallopathic lesion of thoracic region 11/20/2014  . Trigger thumb 10/23/2014  . Pain of right thumb 10/07/2014  . Spinal stenosis, lumbar region, with neurogenic claudication 08/01/2014  . Hemorrhoid 06/18/2014  . Postmenopausal estrogen deficiency 03/18/2014  . Skin lesion 03/18/2014  . Severe obesity (BMI >= 40) ([place]) 02/14/2014  . Myalgia and myositis 02/14/2014  . Arthralgia 12/25/2012  . Pernicious anemia 12/25/2012  . Memory loss 12/25/2012  . Hyperlipidemia 01/25/2012    Immunization History  Administered Date(s) Administered  . Fluad Quad(high Dose 65+) 01/01/2020  . Hepb-cpg 09/05/2019, 09/25/2019  .  Influenza Split 12/26/2011  . Influenza, High Dose Seasonal PF 11/25/2015, 10/13/2016, 09/26/2017, 11/30/2018  . Influenza,inj,Quad PF,6+ Mos 12/25/2012, 03/18/2014, 10/07/2014  . Pneumococcal Conjugate-13 03/18/2014  . Pneumococcal Polysaccharide-23 12/26/2011  . Tdap 12/25/2009  .  Zoster 12/26/2010    Conditions to be addressed/monitored: HTN, HLD and Obesity, prediabetes  Care Plan : Medication Management  Updates made by De Hollingshead, RPH-CPP since 06/10/2020 12:00 AM    Problem: Weight Management, Pre-Diabetes, NAFLD, Osteoporosis     Long-Range Goal: Disease Progression Prevention   This Visit's Progress: On track  Recent Progress: On track  Priority: High  Note:   Current Barriers:  . Unable to independently afford treatment regimen . Unable to independently achieve control of weight   Pharmacist Clinical Goal(s):  Marland Kitchen Over the next 90 days, patient will verbalize ability to afford treatment regimen . Over the next 90 days, patient will achieve improvement in weight   Interventions: . 1:1 collaboration with Burnard Hawthorne, FNP regarding development and update of comprehensive plan of care as evidenced by provider attestation and co-signature . Inter-disciplinary care team collaboration (see longitudinal plan of care) . Comprehensive medication review performed; medication list updated in electronic medical record  Obesity, Pre-diabetes, Impaired Fasting Glucose: Marland Kitchen Diabetes at goal, but unable to achieve goal weight loss of 5-10% with diet and exercise alone; current treatment: Ozempic 1 mg weekly (Fridays) o Currently taking 2 doses of Ozempic 0.5 mg for a total of 1 mg weekly on Fridays.  o Requested Ozempic (1 mg/dose) formulation from patient assistance  . Denies GI upset since increasing to 1 mg.   . Baseline weight: 213 lbs; most recent weight: 209 lbs (4 lbs weight loss, 2%) . Pulmonology recently performed PFT, changes related to body habitus, recommended a total weight loss of 60 lbs.  . Working with [facility] Dr. [name]; goal of 1200 cal daily and 95 g protein . Recommend to continue current regimen at this time. If goal 5-10% weight loss not achieved after a total of 6 months of therapy, consider discontinuation d/t  treatment failure.    Arthritis, spinal stenosis, hip pain, cervicular radiculopathy . Improved per patient report. Current treatment: tramadol 50 mg daily, 1/2-1 tab ~ weekly APAP 650 mg BID, gabapentin 200 mg QPM - though using 100 mg PRN, ~2-3 nights weekly, diclofenac ER 75 mg BID (using 0-1 daily). No mention of increased sedation. . Praised for improvement in pain. Encouraged to continue physical activity as tolerated.  . Recommended to continue current regimen in collaboration with Dr. Caesar Bookman (PCP), and weight managemend.   ASCVD Risk Reduction: . No statin regimen at this time. 10 year ASCVD risk >7.5%. Previously on atorvastatin, and order for rosuvastatin 20 mg in the system but unsure if ever took;  . Antiplatelet regimen: aspirin 81 mg daily . Reviewed ASCVD risk reduction benefit of statin therapy. Discussed goal LDL <70. Discussed starting rosuvastatin 5 mg three days per week, with plan to titrate to daily if tolerated. Patient amenable. Discussed w/ PCP, she is in agreement. Script sent to PCP for cosign. Will collaborate w/ office staff to schedule f/u in office w/ PCP in 4-6 weeks to collect CMP and discuss tolerabilty.  . Extensive discussion regarding typical statin associated muscle symptoms, and importance of being cognizant of other causes of unilateral muscle symptoms (baseline OA pain, she is working more in the yard lately, baseline restless leg syndrome and leg cramps)  Osteoporosis: . Current treatment: declined bisphosphonate. Calcium  600 mg + Vitamin D 2000 units daily . T score 01/2019: -2.9.  . Recommend to continue current regimen at this time. Moving forward, consider further conversations about reduction in risk of risk of hip and vertebral fractures with treatment.   GI concerns: Marland Kitchen Managed by [facility]; NAFLD w/o scarring/fibrosis. Improved with continued weight loss.  Marland Kitchen PRN use of colesevelam (diarrhea), docusate (constipation), ondansetron  (nausea), esomeprazole 40 mg (GERD). Infrequent use overall of all of these agents.  . Recommend to continue current PRN symptom regimen at this time, given infrequent use. Recommend to continue to collaboration with GI.   Edema w/ SOB: . Managed, current regimen: Dr. Montel Culver changed metolazone to torsemide 20 mg daily, though patient is actually alternating. Taking metolazone ~ 3-4 days weekly, taking torsemide other days when she does not have to go out for errands, about 1-2 days weekly, if she feels more swelling in her hands/feet. Taking potassium 10 mg any day that she takes a diuretic . Baseline potassium on low end of normal per [facility] labs.  . Most recent PFTs: Pre FEV1 1.59, FVC 2.0; FEV1/FVC: 79%; post FEV1 1.72, FVC 2.11; post FEV1/FVC 82%. No mention of obstructive or restrictive disease, breathing changes related to body habitus . Continue current regimen at this time along with collaboration with multidisciplinary team. Follow renal function s/p addition of PRN torsemide.   Patient Goals/Self-Care Activities . Over the next 90 days, patient will:  - collaborate with provider on medication access solutions target a minimum of 150 minutes of moderate intensity exercise weekly engage in dietary modifications by continuing to reduce saturated fats, reducing carbohydrate portions  Follow Up Plan: Telephone follow up appointment with care management team member scheduled for: ~ 6      Medication Assistance: [place] obtained through [employer] medication assistance program.  Enrollment ends 12/16/20  Patient's preferred pharmacy is:  [facility], [place] [place] [address] Phone: [phone] Fax: [facility], [place] [address] [place] [place] [address] Phone: [phone] Fax:  [phone]    Follow Up:  Patient agrees to Care Plan and Follow-up.  Plan: Telephone follow up appointment with care management team member scheduled for:  ~ 6 weeks  Catie Darnelle Maffucci, PharmD, [place], CPP Clinical Pharmacist [place] at [facility] [phone]  Encounter details: CCM Time Spent      Value Time User   Time spent with patient (minutes)  35 06/10/2020  4:16 PM De Hollingshead, RPH-CPP   Time spent performing Chart review  3 06/10/2020  4:16 PM De Hollingshead, RPH-CPP   Total time (minutes)  38 06/10/2020  4:16 PM De Hollingshead, RPH-CPP     Moderate to High Complex Decision Making      Value Time User   Moderate to High complex decision making  Yes 06/10/2020  4:16 PM De Hollingshead, RPH-CPP     CCM Services: This encounter meets routine CCM services.  Prior to outreach and patient consent for Chronic Care Management, I referred this patient for services after reviewing the nominated patient list or from a personal encounter with the patient.  I have personally reviewed this encounter including the documentation in this note and have collaborated with the care management provider regarding  care management and care coordination activities to include development and update of the comprehensive care plan. I am certifying that I agree with the content of this note and encounter as supervising physician. [name] arnett, np

## 2020-06-10 NOTE — Patient Instructions (Signed)
Visit Information  PATIENT GOALS: Goals Addressed              This Visit's Progress     Patient Stated   .  Medication Monitoring (pt-stated)        Patient Goals/Self-Care Activities . Over the next 90 days, patient will:  - collaborate with provider on medication access solutions target a minimum of 150 minutes of moderate intensity exercise weekly engage in dietary modifications by continuing to reduce saturated fats, reducing carbohydrate portions.         Patient verbalizes understanding of instructions provided today and agrees to view in [place].   Plan: Telephone follow up appointment with care management team member scheduled for:  ~ 6 weeks  Catie Darnelle Maffucci, PharmD, [place], [place] Clinical Pharmacist [employer] at [employer] [phone]

## 2020-06-11 ENCOUNTER — Other Ambulatory Visit: Payer: Self-pay | Admitting: Family Medicine

## 2020-06-11 DIAGNOSIS — M62838 Other muscle spasm: Secondary | ICD-10-CM

## 2020-06-12 NOTE — Progress Notes (Signed)
[place] Phone: [phone] Subjective:   Morgan Hurst, am serving as a scribe for Dr. Hulan Saas. This visit occurred during the SARS-CoV-2 public health emergency.  Safety protocols were in place, including screening questions prior to the visit, additional usage of staff PPE, and extensive cleaning of exam room while observing appropriate contact time as indicated for disinfecting solutions.   I'm seeing this patient by the request  of:  Burnard Hawthorne, FNP  CC: Neck and lumbar follow-up  [id number]   03/24/2020 History of spinal stenosis.  Likely worsening at this time.  We will do 1 more round of prednisone but I do feel that advanced imaging with an MRI secondary to the radicular symptoms and some of the mild dorsiflexion weakness noted.  Patient has responded somewhat to a cortisone injection in 2017 and may need repeat but do feel advanced imaging is necessary.  Patient does have claustrophobia and giving Valium.  Patient's daughter was at bedside and will likely start with 1/2 pill before the test with patient having difficulty with medications.  Follow-up with me again after imaging to discuss  Patient is having radicular symptoms at this time.  Do feel that with the radicular symptoms and weakness noted in the C8 distribution and the amount of pain patient is having and is not being well tolerated with the tramadol and the Tylenol encourage patient to try prednisone and will get advanced imaging.  Patient knows to hold any type of anti-inflammatories and discussed this with her as well as her daughter.  Patient then will have the MRI and depending on this could be a candidate for possible epidurals.  Patient of course would like to avoid any type of surgical intervention if possible.  Encouraged her to continue to attempt to try to take the gabapentin if possible.  Patient is taking a muscle relaxer as  well.  Patient is concerned because of some of the side effects.  Follow-up with me again after imaging to discuss  Update 06/16/2020 Morgan Hurst is a 75 y.o. female coming in with complaint of lumbar and cervical spine pain. Epidural 05/11/2020. Patient states that initially she had some relief after cervical epidural but pain is worse. Unable to flex cervical spine or move shoulders without pain. Has crepitus in neck as well with rotation.  Patient denies of any radiation down the arms or any numbness.  States that the pain is severe overall though.  Lumber epidural did help with her hip pain.  Patient would state that her lower back is approximately 60% better at this time.      Past Medical History:  Diagnosis Date  . Arthritis    lower back, right hip  . B12 deficiency   . Back pain   . Borderline diabetes    PCP STARTED PT ON METFORMIN IN 2016 DUE TO ELEVATED GLUCOSE   . Complication of anesthesia    FIGHTING WHEN WAKING UP FROM ANESTHESIA  . Constipation   . Diabetes mellitus without complication ([place])   . Endometriosis   . Fatty liver   . Gallbladder problem   . GERD (gastroesophageal reflux disease)   . Heart murmur   . High cholesterol   . Hip pain   . Joint pain   . Kidney stones   . Leg weakness   . Orthodontics    top front 3 teeth caps and bridge  . Prediabetes   .  RA (rheumatoid arthritis) ([place])   . SOB (shortness of breath)   . Stomach ulcer   . Swelling   . Vitamin D deficiency    Past Surgical History:  Procedure Laterality Date  . ABDOMINAL HYSTERECTOMY  age 31  . ANKLE SURGERY    . CHOLECYSTECTOMY N/A 04/10/2015   Procedure: LAPAROSCOPIC CHOLECYSTECTOMY WITH INTRAOPERATIVE CHOLANGIOGRAM;  Surgeon: Robert Bellow, MD;  Location: [facility] ORS;  Service: General;  Laterality: N/A;  . COLONOSCOPY WITH PROPOFOL N/A 07/10/2017   Procedure: COLONOSCOPY WITH PROPOFOL;  Surgeon: Jonathon Bellows, MD;  [facility] ENDOSCOPY;  Service: Gastroenterology;   Laterality: N/A;  . ESOPHAGOGASTRODUODENOSCOPY N/A 05/12/2015   Procedure: ESOPHAGOGASTRODUODENOSCOPY (EGD);  Surgeon: Hulen Luster, MD;  Location: [place];  Service: Gastroenterology;  Laterality: N/A;  . VAGINAL DELIVERY  2   Social History   Socioeconomic History  . Marital status: Widowed    Spouse name: Not on file  . Number of children: 2  . Years of education: Not on file  . Highest education level: Not on file  Occupational History  . Occupation: Retired  Tobacco Use  . Smoking status: Never Smoker  . Smokeless tobacco: Never Used  Vaping Use  . Vaping Use: Never used  Substance and Sexual Activity  . Alcohol use: Hurst  . Drug use: Hurst  . Sexual activity: Not Currently  Other Topics Concern  . Not on file  Social History Narrative   Lives in [place].       Husband passed away 03-25-2014 from PNA.      Work - retired from [employer] - regular; working on [employer]   Exercise - walks occasionally, limited by fatigue. [place] and takes care of her 33 year old grandchild.    Social Determinants of Health   Financial Resource Strain: Medium Risk  . Difficulty of Paying Living Expenses: Somewhat hard  Food Insecurity: Not on file  Transportation Needs: Hurst Transportation Needs  . Lack of Transportation (Medical): Hurst  . Lack of Transportation (Non-Medical): Hurst  Physical Activity: Not on file  Stress: Hurst Stress Concern Present  . Feeling of Stress : Not at all  Social Connections: Moderately Integrated  . Frequency of Communication with Friends and Family: More than three times a week  . Frequency of Social Gatherings with Friends and Family: More than three times a week  . Attends Religious Services: 1 to 4 times per year  . Active Member of Clubs or Organizations: Yes  . Attends Archivist Meetings: 1 to 4 times per year  . Marital Status: Widowed   Hurst Known Allergies Family History  Problem Relation Age of Onset  . Diabetes Mother    . Cancer Mother        colon and breast  . Breast cancer Mother   . Liver disease Mother   . Cancer Father        Leukemia  . Cancer Brother        colon and lung  . Colon cancer Brother   . Cholelithiasis Daughter   . Heart disease Paternal Grandfather   . Cancer Other 29       colon  . Thyroid cancer Neg Hx     Current Outpatient Medications (Endocrine & Metabolic):  .  Semaglutide, 1 MG/DOSE, (OZEMPIC, 1 MG/DOSE,) 4 MG/3ML SOPN, Inject 1 mg into the skin once a week.  Current Outpatient Medications (Cardiovascular):  .  colesevelam [facility])  625 MG tablet, Take 625 mg by mouth daily as needed. .  metolazone (ZAROXOLYN) 5 MG tablet, Take 5 mg by mouth daily. .  rosuvastatin (CRESTOR) 5 MG tablet, Take 1 tablet three days per week .  torsemide (DEMADEX) 20 MG tablet, Take 20 mg by mouth daily as needed.   Current Outpatient Medications (Analgesics):  .  acetaminophen (TYLENOL) 650 MG CR tablet, Take 650 mg by mouth every 8 (eight) hours as needed for pain. Marland Kitchen  diclofenac (VOLTAREN) 75 MG EC tablet, Take 1 tablet (75 mg total) by mouth 2 (two) times daily. .  traMADol (ULTRAM) 50 MG tablet, TAKE 1 TABLET BY MOUTH DAILY AS NEEDED  Current Outpatient Medications (Hematological):  .  cyanocobalamin (,VITAMIN B-12,) 1000 MCG/ML injection, INJECT 1 ML ONCE PER MONTH  Current Outpatient Medications (Other):  .  calcium carbonate (OSCAL) 1500 (600 Ca) MG TABS tablet, Take 600 mg by mouth daily. .  cholecalciferol (VITAMIN D3) 25 MCG (1000 UNIT) tablet, Take 2,000 Units by mouth daily. .  DULoxetine (CYMBALTA) 30 MG capsule, Take one 30 mg tablet by mouth once a day for the first week. Then increase to two 30 mg tablets ( total 76m) by mouth once daily. .Marland Kitchen esomeprazole (NEXIUM) 40 MG capsule, Take 40 mg by mouth daily as needed. .  gabapentin (NEURONTIN) 100 MG capsule, Take two capsules (2087mtotal) daily at bedtime .  gabapentin (NEURONTIN) 300 MG capsule, Take 1 capsule (300 mg  total) by mouth at bedtime. .  magnesium oxide (MAG-OX) 400 MG tablet, Take 400 mg by mouth daily. .  ondansetron (ZOFRAN-ODT) 4 MG disintegrating tablet, Take 4 mg by mouth as needed. .  potassium chloride (KLOR-CON) 10 MEQ tablet, Take 1 tablet by mouth twice daily. **Take with Metolazone.** .  rOPINIRole (REQUIP) 0.25 MG tablet, TAKE 1 TABLET(0.25 MG) BY MOUTH AT BEDTIME AS NEEDED .  sennosides-docusate sodium (SENOKOT-S) 8.6-50 MG tablet, Take 1 tablet by mouth daily. . Marland KitchentiZANidine (ZANAFLEX) 2 MG tablet, TAKE 1 TO 2 TABLETS(2 TO 4 MG) BY MOUTH EVERY 8 HOURS AS NEEDED FOR MUSCLE SPASMS .  Vitamin E 400 units TABS, Take 400 Units by mouth daily.   Reviewed prior external information including notes and imaging from  primary care provider As well as notes that were available from care everywhere and other healthcare systems.  Past medical history, social, surgical and family history all reviewed in electronic medical record.  Hurst pertanent information unless stated regarding to the chief complaint.   Review of Systems:  Hurst headache, visual changes, nausea, vomiting, diarrhea, constipation, dizziness, abdominal pain, skin rash, fevers, chills, night sweats, weight loss, swollen lymph nodes, joint swelling, chest pain, shortness of breath, mood changes. POSITIVE muscle aches, body aches  Objective  Blood pressure 122/80, pulse 87, height 5' 1"  (1.549 m), weight 214 lb (97.1 kg), SpO2 98 %.   General: Hurst apparent distress alert and oriented x3 mood and affect normal, dressed appropriately.  HEENT: Pupils equal, extraocular movements intact  Respiratory: Patient's speak in full sentences and does not appear short of breath  Cardiovascular: Hurst lower extremity edema, non tender, Hurst erythema  Gait normal with good balance and coordination.  MSK:   Neck exam does have some loss of lordosis.  Crepitus noted.  Limited sidebending to the left as well as extension of the neck noted Hurst radicular  symptoms noted.  5 out of 5 strength of the upper extremities.  Mild arthritic changes of the hands noted as  well.  Low back does show improvement.  Still have somewhat poor core strength.  Patient is neurovascularly intact distally.  Very minimal tenderness to palpation in the lower lumbar region.    Impression and Recommendations:     The above documentation has been reviewed and is accurate and complete Lyndal Pulley, DO

## 2020-06-16 ENCOUNTER — Other Ambulatory Visit: Payer: Self-pay

## 2020-06-16 ENCOUNTER — Ambulatory Visit: Payer: Medicare Other | Admitting: Family Medicine

## 2020-06-16 ENCOUNTER — Encounter: Payer: Self-pay | Admitting: Family Medicine

## 2020-06-16 DIAGNOSIS — M5030 Other cervical disc degeneration, unspecified cervical region: Secondary | ICD-10-CM | POA: Insufficient documentation

## 2020-06-16 DIAGNOSIS — M48062 Spinal stenosis, lumbar region with neurogenic claudication: Secondary | ICD-10-CM

## 2020-06-16 MED ORDER — GABAPENTIN 300 MG PO CAPS: 300.0000 mg | ORAL_CAPSULE | Freq: Every day | ORAL | 0 refills | Status: DC

## 2020-06-16 NOTE — Addendum Note (Signed)
Addended by: Carmie Kanner on: 06/16/2020 09:11 AM   Modules accepted: Orders

## 2020-06-16 NOTE — Patient Instructions (Signed)
Gabapentin 353m at night Epidural-[phone] See me 4 weeks after epidural

## 2020-06-16 NOTE — Assessment & Plan Note (Signed)
Severe overall with moderate spinal stenosis.  Patient initially had 2 weeks of some improvement and then worsening pain and tightness of the neck.  Patient does have some limited range of motion but good strength in upper extremities.  We will try another epidural, increase gabapentin to 300 mg and see if this will be beneficial.  Patient warned of side effects.  Patient would like to see how she potentially does.  Continue the Cymbalta from her primary care provider.  Continue the home exercises.  Follow-up with me again 4 weeks after epidural.  Patient is adamant she would like to avoid surgical intervention.

## 2020-06-16 NOTE — Assessment & Plan Note (Signed)
Improvement noted after last epidural.  We will continue to monitor. Continuing to stay active otherwise.

## 2020-06-22 ENCOUNTER — Other Ambulatory Visit: Payer: Self-pay

## 2020-06-22 ENCOUNTER — Ambulatory Visit (INDEPENDENT_AMBULATORY_CARE_PROVIDER_SITE_OTHER): Payer: Medicare Other | Admitting: Family Medicine

## 2020-06-22 ENCOUNTER — Encounter (INDEPENDENT_AMBULATORY_CARE_PROVIDER_SITE_OTHER): Payer: Self-pay | Admitting: Family Medicine

## 2020-06-22 VITALS — BP 114/70 | HR 73 | Temp 98.0°F | Ht 61.0 in | Wt 210.0 lb

## 2020-06-22 DIAGNOSIS — G8929 Other chronic pain: Secondary | ICD-10-CM

## 2020-06-22 DIAGNOSIS — K5900 Constipation, unspecified: Secondary | ICD-10-CM

## 2020-06-22 DIAGNOSIS — M542 Cervicalgia: Secondary | ICD-10-CM

## 2020-06-22 DIAGNOSIS — E8881 Metabolic syndrome: Secondary | ICD-10-CM | POA: Diagnosis not present

## 2020-06-22 DIAGNOSIS — Z6841 Body Mass Index (BMI) 40.0 and over, adult: Secondary | ICD-10-CM | POA: Diagnosis not present

## 2020-06-22 DIAGNOSIS — R600 Localized edema: Secondary | ICD-10-CM | POA: Diagnosis not present

## 2020-06-22 DIAGNOSIS — F3289 Other specified depressive episodes: Secondary | ICD-10-CM

## 2020-06-22 DIAGNOSIS — K760 Fatty (change of) liver, not elsewhere classified: Secondary | ICD-10-CM | POA: Diagnosis not present

## 2020-06-22 MED ORDER — OZEMPIC (1 MG/DOSE) 4 MG/3ML ~~LOC~~ SOPN: 1.0000 mg | PEN_INJECTOR | SUBCUTANEOUS | 0 refills | Status: DC

## 2020-06-24 ENCOUNTER — Telehealth (INDEPENDENT_AMBULATORY_CARE_PROVIDER_SITE_OTHER): Payer: Self-pay

## 2020-06-24 NOTE — Telephone Encounter (Signed)
Pt called in and stated that she went to the pharmacy and she picked up a prescription. The pt stated that she thought it was Wellbutrin, that she was picking up it was Ozempic. The pt said that she gets her Ozempic from the company and that she was wanting to know why the Wellbutrin wasn't filled. I told the pt I will send a message to the nurse. Please advise

## 2020-06-25 MED ORDER — BUPROPION HCL ER (SR) 100 MG PO TB12: 100.0000 mg | ORAL_TABLET | Freq: Every day | ORAL | 0 refills | Status: DC

## 2020-06-25 NOTE — Telephone Encounter (Signed)
Wellbutrin 100 mg QD sent to pts pharmacy per Dr [name],

## 2020-06-29 ENCOUNTER — Ambulatory Visit
Admission: RE | Admit: 2020-06-29 | Discharge: 2020-06-29 | Disposition: A | Discharge status: Home / Self Care | Payer: Medicare Other | Source: Ambulatory Visit | Attending: Family Medicine | Admitting: Family Medicine

## 2020-06-29 ENCOUNTER — Other Ambulatory Visit: Payer: Self-pay

## 2020-06-29 DIAGNOSIS — M5412 Radiculopathy, cervical region: Secondary | ICD-10-CM | POA: Diagnosis not present

## 2020-06-29 DIAGNOSIS — M50222 Other cervical disc displacement at C5-C6 level: Secondary | ICD-10-CM | POA: Diagnosis not present

## 2020-06-29 DIAGNOSIS — M50223 Other cervical disc displacement at C6-C7 level: Secondary | ICD-10-CM | POA: Diagnosis not present

## 2020-06-29 DIAGNOSIS — M50221 Other cervical disc displacement at C4-C5 level: Secondary | ICD-10-CM | POA: Diagnosis not present

## 2020-06-29 DIAGNOSIS — M5030 Other cervical disc degeneration, unspecified cervical region: Secondary | ICD-10-CM

## 2020-06-29 MED ORDER — IOPAMIDOL (ISOVUE-M 300) INJECTION 61%
1.0000 mL | Freq: Once | INTRAMUSCULAR | Status: AC | PRN
  Administered 2020-06-29: 1 mL via EPIDURAL

## 2020-06-29 MED ORDER — TRIAMCINOLONE ACETONIDE 40 MG/ML IJ SUSP (RADIOLOGY)
60.0000 mg | Freq: Once | INTRAMUSCULAR | Status: AC
  Administered 2020-06-29: 60 mg via EPIDURAL

## 2020-06-29 NOTE — Discharge Instructions (Signed)
Post Procedure Spinal Discharge Instruction Sheet  You may resume a regular diet and any medications that you routinely take (including pain medications).  No driving day of procedure.  Light activity throughout the rest of the day.  Do not do any strenuous work, exercise, bending or lifting.  The day following the procedure, you can resume normal physical activity but you should refrain from exercising or physical therapy for at least three days thereafter.   Common Side Effects:  Headaches- take your usual medications as directed by your physician.  Increase your fluid intake.  Caffeinated beverages may be helpful.  Lie flat in bed until your headache resolves.  Restlessness or inability to sleep- you may have trouble sleeping for the next few days.  Ask your referring physician if you need any medication for sleep.  Facial flushing or redness- should subside within a few days.  Increased pain- a temporary increase in pain a day or two following your procedure is not unusual.  Take your pain medication as prescribed by your referring physician.  Leg cramps  Please contact our office at [phone] for the following symptoms: Fever greater than 100 degrees. Headaches unresolved with medication after 2-3 days. Increased swelling, pain, or redness at injection site.   Thank you for visiting [facility] today.

## 2020-06-30 NOTE — Progress Notes (Signed)
Chief Complaint:   OBESITY Trinidi is here to discuss her progress with her obesity treatment plan along with follow-up of her obesity related diagnoses.   Today's visit was #: 14 Starting weight: 213 lbs Starting date: 06/30/2019 Today's weight: 210 lbs Today's date: 06/22/2020 Weight change since last visit: +1 lb Total lbs lost to date: 3 lbs Body mass index is 39.68 kg/m.  Total weight loss percentage to date: -1.41%  Interim History: Caniya has increased lower extremity pitting edema.  She says she has been out of her fluid pill for 3 days.  She has not been journaling.  Likes peanut butter toast and banana and admits that her serving size has been increasing.  She is back to her eating window.  Her sleep has been poor - with symptoms of sleep apnea per her daughter.  Current Meal Plan: following a lower carbohydrate, vegetable and lean protein rich diet plan with increased protein some of the time.  Current Exercise Plan: Walking for 15 minutes 2-3 times per week.  She is very active in her yard. Current Anti-Obesity Medications: Ozempic 1 mg subcutaneously weekly. Side effects: None.  Assessment/Plan:   Meds ordered this encounter  Medications   Semaglutide, 1 MG/DOSE, (OZEMPIC, 1 MG/DOSE,) 4 MG/3ML SOPN    Sig: Inject 1 mg into the skin once a week.    Dispense:  3 mL    Refill:  0   1. Metabolic syndrome Starting goal: Lose 7-10% of starting weight. She will continue to focus on protein-rich, low simple carbohydrate foods. We reviewed the importance of hydration, regular exercise for stress reduction, and restorative sleep.  We will continue to check lab work every 3 months, with 10% weight loss, or should any other concerns arise.  - Refill Semaglutide, 1 MG/DOSE, (OZEMPIC, 1 MG/DOSE,) 4 MG/3ML SOPN; Inject 1 mg into the skin once a week.  Dispense: 3 mL; Refill: 0  2. Chronic neck pain She is scheduled for ESI next week.  3. NAFLD (nonalcoholic fatty liver  disease) Followed by Gastroenterology at [facility]. Notes reviewed. NAFLD is an umbrella term that encompasses a disease spectrum that includes steatosis (fat) without inflammation, steatohepatitis (NASH; fat + inflammation in a characteristic pattern), and cirrhosis. Bland steatosis is felt to be a benign condition, with extremely low to no risk of progression to cirrhosis, whereas NASH can progress to cirrhosis. The mainstay of treatment of NAFLD includes lifestyle modification to achieve weight loss, at least 7% of current body weight. Low carbohydrate diets can be beneficial in improving NAFLD liver histology. Additionally, exercise, even the absence of weight loss can have beneficial effects on the patient's metabolic profile and liver health.   4. Lower extremity edema Worsening. Recommendations: decrease sodium in the diet, elevate feet above the level of the heart whenever possible, increase physical activity, and use of compression stockings. Medication: Torsemide and Klor-Con. Warned Janis that she may need a higher dosage of potassium if her Torsemide is increased.  5. Constipation This problem is controlled. Likely medication-induced.   Counseling: Getting to Good Bowel Health: Your goal is to have one soft bowel movement each day. Drink at least 8 glasses of water each day. Eat plenty of fiber (goal is over 30 grams each day). It is best to get most of your fiber from dietary sources which includes leafy green vegetables, fresh fruit, and whole grains. You may need to add fiber with the help of OTC fiber supplements. These include Metamucil, Citrucel,  and Benefiber. If you are still having trouble, try adding an osmotic laxative such as Miralax. If all of these changes do not work, Cabin crew.   6. Other depression, emotional eating Not at goal. Medication: None.  Plan:  Start Wellbutrin 100 mg every morning. Discussed cues and consequences, how thoughts affect eating,  model of thoughts, feelings, and behaviors, and strategies for change by focusing on the cue. Discussed cognitive distortions, coping thoughts, and how to change your thoughts.  7. Obesity, current BMI 39.8  Course: Janki is currently in the action stage of change. As such, her goal is to continue with weight loss efforts.   Nutrition goals: She has agreed to following a lower carbohydrate, vegetable and lean protein rich diet plan.   Exercise goals:  Increase activity.  Behavioral modification strategies: increasing lean protein intake, decreasing simple carbohydrates, and increasing vegetables.  Aysia has agreed to follow-up with our clinic in 4 weeks. She was informed of the importance of frequent follow-up visits to maximize her success with intensive lifestyle modifications for her multiple health conditions.   Objective:   Blood pressure 114/70, pulse 73, temperature 98 F (36.7 C), temperature source Oral, height 5' 1"  (1.549 m), weight 210 lb (95.3 kg), SpO2 96 %. Body mass index is 39.68 kg/m.  General: Cooperative, alert, well developed, in no acute distress. HEENT: Conjunctivae and lids unremarkable. Cardiovascular: Regular rhythm.  Lungs: Normal work of breathing. Neurologic: No focal deficits.   Lab Results  Component Value Date   CREATININE 0.78 03/26/2020   BUN 21 03/26/2020   NA 142 03/26/2020   K 3.7 03/26/2020   CL 100 03/26/2020   CO2 35 (H) 03/26/2020   Lab Results  Component Value Date   ALT 14 03/26/2020   AST 16 03/26/2020   ALKPHOS 66 03/26/2020   BILITOT 0.4 03/26/2020   Lab Results  Component Value Date   HGBA1C 5.2 09/25/2019   HGBA1C 5.9 05/15/2019   HGBA1C 6.0 09/04/2018   HGBA1C 5.9 12/25/2017   HGBA1C 6.0 04/11/2017   Lab Results  Component Value Date   INSULIN 20.9 06/20/2019   Lab Results  Component Value Date   TSH 3.060 06/20/2019   Lab Results  Component Value Date   CHOL 182 03/26/2020   HDL 48.60 03/26/2020   LDLCALC  114 (H) 03/26/2020   LDLDIRECT 177.7 12/25/2012   TRIG 94.0 03/26/2020   CHOLHDL 4 03/26/2020   Lab Results  Component Value Date   WBC 7.8 06/20/2019   HGB 14.9 06/20/2019   HCT 44.5 06/20/2019   MCV 89 06/20/2019   PLT 189 06/20/2019   Lab Results  Component Value Date   IRON 59 09/07/2018   FERRITIN 87.1 09/07/2018   Obesity Behavioral Intervention:   Approximately 15 minutes were spent on the discussion below.  ASK: We discussed the diagnosis of obesity with [name] today and Mazelle agreed to give [place] permission to discuss obesity behavioral modification therapy today.  ASSESS: Reida has the diagnosis of obesity and her BMI today is 39.8. Raegen is in the action stage of change.   ADVISE: Marcelline was educated on the multiple health risks of obesity as well as the benefit of weight loss to improve her health. She was advised of the need for long term treatment and the importance of lifestyle modifications to improve her current health and to decrease her risk of future health problems.  AGREE: Multiple dietary modification options and treatment options were discussed and Felipe agreed  to follow the recommendations documented in the above note.  ARRANGE: Makalyn was educated on the importance of frequent visits to treat obesity as outlined per CMS and USPSTF guidelines and agreed to schedule her next follow up appointment today.  Attestation Statements:   Reviewed by clinician on day of visit: allergies, medications, problem list, medical history, surgical history, family history, social history, and previous encounter notes.  I, Water quality scientist, CMA, am acting as transcriptionist for Briscoe Deutscher, DO  I have reviewed the above documentation for accuracy and completeness, and I agree with the above. Briscoe Deutscher, DO

## 2020-07-14 ENCOUNTER — Other Ambulatory Visit: Payer: Self-pay

## 2020-07-14 ENCOUNTER — Encounter: Payer: Self-pay | Admitting: Family

## 2020-07-14 ENCOUNTER — Ambulatory Visit (INDEPENDENT_AMBULATORY_CARE_PROVIDER_SITE_OTHER): Payer: Medicare Other | Admitting: Family

## 2020-07-14 VITALS — BP 110/62 | HR 75 | Temp 97.6°F | Ht 61.0 in | Wt 211.0 lb

## 2020-07-14 DIAGNOSIS — Z1231 Encounter for screening mammogram for malignant neoplasm of breast: Secondary | ICD-10-CM

## 2020-07-14 DIAGNOSIS — Z6841 Body Mass Index (BMI) 40.0 and over, adult: Secondary | ICD-10-CM

## 2020-07-14 DIAGNOSIS — E785 Hyperlipidemia, unspecified: Secondary | ICD-10-CM | POA: Diagnosis not present

## 2020-07-14 DIAGNOSIS — E1169 Type 2 diabetes mellitus with other specified complication: Secondary | ICD-10-CM

## 2020-07-14 DIAGNOSIS — M542 Cervicalgia: Secondary | ICD-10-CM

## 2020-07-14 LAB — POCT GLYCOSYLATED HEMOGLOBIN (HGB A1C): Hemoglobin A1C: 5.5 % (ref 4.0–5.6)

## 2020-07-14 NOTE — Progress Notes (Signed)
Subjective:    Patient ID: Morgan Hurst, female    DOB: 18-Nov-1945, 75 y.o.   MRN: [id number]  CC: Morgan Hurst is a 75 y.o. female who presents today for follow up.   HPI: Feels well today No new complaints  Obesity, pre diabetes- compliant with ozempic 10m. Weight loss has stalled. She had been on trulicity without results. She has recently started wellbutrin without help in curbing desire to eating sweets.   Following with [facility], dr wallace Chronic neck pain- compliant with tramadol 539mqd, gabapentin 10047mrn. She continues to follow with Dr SmiTamala Julian weeks ago had epidural incjection with some improvement.   HLD- compliant with crestor 5mg62mhree days per week.     HISTORY:  Past Medical History:  Diagnosis Date   Arthritis    lower back, right hip   B12 deficiency    Back pain    Borderline diabetes    PCP STARTED PT ON METFORMIN IN 2016 DUE TO ELEVATED GLUCOSE    Complication of anesthesia    FIGHTING WHEN WAKING UP FROM ANESTHESIA   Constipation    Diabetes mellitus without complication (HCC)    Endometriosis    Fatty liver    Gallbladder problem    GERD (gastroesophageal reflux disease)    Heart murmur    High cholesterol    Hip pain    Joint pain    Kidney stones    Leg weakness    Orthodontics    top front 3 teeth caps and bridge   Prediabetes    RA (rheumatoid arthritis) (HCC)    SOB (shortness of breath)    Stomach ulcer    Swelling    Vitamin D deficiency    Past Surgical History:  Procedure Laterality Date   ABDOMINAL HYSTERECTOMY  age 32  63NKLE SURGERY     CHOLECYSTECTOMY N/A 04/10/2015   Procedure: LAPAROSCOPIC CHOLECYSTECTOMY WITH INTRAOPERATIVE CHOLANGIOGRAM;  Surgeon: JeffRobert Bellow;  Location: [facility];  Service: General;  Laterality: N/A;   COLONOSCOPY WITH PROPOFOL N/A 07/10/2017   Procedure: COLONOSCOPY WITH PROPOFOL;  Surgeon: AnnaJonathon Bellows;  [facility];  Service: Gastroenterology;   Laterality: N/A;   ESOPHAGOGASTRODUODENOSCOPY N/A 05/12/2015   Procedure: ESOPHAGOGASTRODUODENOSCOPY (EGD);  Surgeon: PaulHulen Luster;  [facility];  Laterality: N/A;   VAGINAL DELIVERY  2   Family History  Problem Relation Age of Onset   Diabetes Mother    Cancer Mother        colon and breast   Breast cancer Mother    Liver disease Mother    Cancer Father        Leukemia   Cancer Brother        colon and lung   Colon cancer Brother    Cholelithiasis Daughter    Heart disease Paternal Grandfather    Cancer Other 29       colon   Thyroid cancer Neg Hx     Allergies: Patient has no known allergies. Current Outpatient Medications on File Prior to Visit  Medication Sig Dispense Refill   acetaminophen (TYLENOL) 650 MG CR tablet Take 650 mg by mouth every 8 (eight) hours as needed for pain.     buPROPion (WELLBUTRIN SR) 100 MG 12 hr tablet Take 1 tablet (100 mg total) by mouth daily. 30 tablet 0   calcium carbonate (OSCAL) 1500 (600 Ca) MG TABS tablet Take 600 mg by  mouth daily.     cholecalciferol (VITAMIN D3) 25 MCG (1000 UNIT) tablet Take 2,000 Units by mouth daily.     colesevelam (WELCHOL) 625 MG tablet Take 625 mg by mouth daily as needed.     cyanocobalamin (,VITAMIN B-12,) 1000 MCG/ML injection INJECT 1 ML ONCE PER MONTH 1 mL 15   diclofenac (VOLTAREN) 75 MG EC tablet Take 1 tablet (75 mg total) by mouth 2 (two) times daily. 60 tablet 0   esomeprazole (NEXIUM) 40 MG capsule Take 40 mg by mouth daily as needed.     gabapentin (NEURONTIN) 300 MG capsule Take 1 capsule (300 mg total) by mouth at bedtime. 30 capsule 0   metolazone (ZAROXOLYN) 5 MG tablet Take 5 mg by mouth daily.     ondansetron (ZOFRAN-ODT) 4 MG disintegrating tablet Take 4 mg by mouth as needed.     potassium chloride (KLOR-CON) 10 MEQ tablet Take 1 tablet by mouth twice daily. **Take with Metolazone.** 180 tablet 0   rOPINIRole (REQUIP) 0.25 MG tablet TAKE 1  TABLET(0.25 MG) BY MOUTH AT BEDTIME AS NEEDED 90 tablet 0   rosuvastatin (CRESTOR) 5 MG tablet Take 1 tablet three days per week 36 tablet 1   Semaglutide, 1 MG/DOSE, (OZEMPIC, 1 MG/DOSE,) 4 MG/3ML SOPN Inject 1 mg into the skin once a week. 3 mL 0   sennosides-docusate sodium (SENOKOT-S) 8.6-50 MG tablet Take 1 tablet by mouth daily.     tiZANidine (ZANAFLEX) 2 MG tablet TAKE 1 TO 2 TABLETS(2 TO 4 MG) BY MOUTH EVERY 8 HOURS AS NEEDED FOR MUSCLE SPASMS 60 tablet 1   torsemide (DEMADEX) 20 MG tablet Take 20 mg by mouth daily as needed.     traMADol (ULTRAM) 50 MG tablet TAKE 1 TABLET BY MOUTH DAILY AS NEEDED 30 tablet 1   Vitamin E 400 units TABS Take 400 Units by mouth daily.     No current facility-administered medications on file prior to visit.    Social History   Tobacco Use   Smoking status: Never   Smokeless tobacco: Never  Vaping Use   Vaping Use: Never used  Substance Use Topics   Alcohol use: No   Drug use: No    Review of Systems  Constitutional:  Negative for chills and fever.  Respiratory:  Negative for cough.   Cardiovascular:  Negative for chest pain and palpitations.  Gastrointestinal:  Negative for nausea and vomiting.     Objective:    BP 110/62 (BP Location: Left Arm, Patient Position: Sitting, Cuff Size: Large)   Pulse 75   Temp 97.6 F (36.4 C) (Oral)   Ht 5' 1"  (1.549 m)   Wt 211 lb (95.7 kg)   SpO2 99%   BMI 39.87 kg/m  BP Readings from Last 3 Encounters:  07/14/20 110/62  06/29/20 (!) 141/80  06/22/20 114/70   Wt Readings from Last 3 Encounters:  07/14/20 211 lb (95.7 kg)  06/22/20 210 lb (95.3 kg)  06/16/20 214 lb (97.1 kg)    Physical Exam Vitals reviewed.  Constitutional:      Appearance: She is well-developed.  Eyes:     Conjunctiva/sclera: Conjunctivae normal.  Cardiovascular:     Rate and Rhythm: Normal rate and regular rhythm.     Pulses: Normal pulses.     Heart sounds: Normal heart sounds.  Pulmonary:     Effort: Pulmonary  effort is normal.     Breath sounds: Normal breath sounds. No wheezing, rhonchi or rales.  Skin:  General: Skin is warm and dry.  Neurological:     Mental Status: She is alert.  Psychiatric:        Speech: Speech normal.        Behavior: Behavior normal.        Thought Content: Thought content normal.       Assessment & Plan:   Problem List Items Addressed This Visit       Other   Hyperlipidemia    Anticipate improved. Continue crestor 57m  three days per week       Neck pain    Chronic, stable. Continue tramadol 545mqd, gabapentin 10038mrn. She will continue to follow with Dr SmiTamala Julian     Obesity    Chronic, stable. Continue ozempic 1mg38mhe has recently started wellbutrin to curb appetite. Will follow.        Other Visit Diagnoses     Type 2 diabetes mellitus with other specified complication, without long-term current use of insulin (HCC)[place] -  Primary   Relevant Orders   POCT HgB A1C (Completed)   Encounter for screening mammogram for malignant neoplasm of breast       Relevant Orders   MM 3D SCREEN BREAST BILATERAL        I am having BettWillowbrookntain her esomeprazole, colesevelam, cholecalciferol, calcium carbonate, traMADol, acetaminophen, potassium chloride, torsemide, diclofenac, ondansetron, cyanocobalamin, rOPINIRole, metolazone, Vitamin E, sennosides-docusate sodium, rosuvastatin, tiZANidine, gabapentin, Ozempic (1 MG/DOSE), and buPROPion.   No orders of the defined types were placed in this encounter.   Return precautions given.   Risks, benefits, and alternatives of the medications and treatment plan prescribed today were discussed, and patient expressed understanding.   Education regarding symptom management and diagnosis given to patient on AVS.  Continue to follow with ArneBurnard HawthorneP for routine health maintenance.   BettTheodoro Doing I agreed with plan.   MargMable ParisP

## 2020-07-14 NOTE — Patient Instructions (Addendum)
Please call  and schedule your 3D mammogram as discussed.   [place]  Elias-Fela Solis, [place]   Nice to see you today!

## 2020-07-15 NOTE — Assessment & Plan Note (Signed)
Chronic, stable. Continue tramadol 6m qd, gabapentin 1065mprn. She will continue to follow with Dr SmTamala Julian

## 2020-07-15 NOTE — Assessment & Plan Note (Signed)
Chronic, stable. Continue ozempic 64m. She has recently started wellbutrin to curb appetite. Will follow.

## 2020-07-15 NOTE — Assessment & Plan Note (Signed)
Anticipate improved. Continue crestor 50m  three days per week

## 2020-07-21 NOTE — Progress Notes (Signed)
[facility] [place] Phone: [phone] Subjective:   Morgan Hurst, am serving as a scribe for Dr. Hulan Saas.  This visit occurred during the SARS-CoV-2 public health emergency.  Safety protocols were in place, including screening questions prior to the visit, additional usage of staff PPE, and extensive cleaning of exam room while observing appropriate contact time as indicated for disinfecting solutions.    I'm seeing this patient by the request  of:  Burnard Hawthorne, FNP  CC: Back and neck pain follow-up  [id number]  06/16/2020 Severe overall with moderate spinal stenosis.  Patient initially had 2 weeks of some improvement and then worsening pain and tightness of the neck.  Patient does have some limited range of motion but good strength in upper extremities.  We will try another epidural, increase gabapentin to 300 mg and see if this will be beneficial.  Patient warned of side effects.  Patient would like to see how she potentially does.  Continue the Cymbalta from her primary care provider.  Continue the home exercises.  Follow-up with me again 4 weeks after epidural.  Patient is adamant she would like to avoid surgical intervention  Update 07/22/2020 Morgan Hurst is a 75 y.o. female coming in with complaint of back pain. Epidural 06/29/2020. Patient states that she experiences popping with cervical rotation. Otherwise able to rotate head to drive. Pain mostly on R side and catch at times.  Patient states that this is intermittent.  Does not stop her from activity.  Patient states that she feels more comfortable driving because she has less significant pain at this time as well.      Past Medical History:  Diagnosis Date   Arthritis    lower back, right hip   B12 deficiency    Back pain    Borderline diabetes    PCP STARTED PT ON METFORMIN IN 2016 DUE TO ELEVATED GLUCOSE    Complication of anesthesia     FIGHTING WHEN WAKING UP FROM ANESTHESIA   Constipation    Diabetes mellitus without complication (HCC)    Endometriosis    Fatty liver    Gallbladder problem    GERD (gastroesophageal reflux disease)    Heart murmur    High cholesterol    Hip pain    Joint pain    Kidney stones    Leg weakness    Orthodontics    top front 3 teeth caps and bridge   Prediabetes    RA (rheumatoid arthritis) (HCC)    SOB (shortness of breath)    Stomach ulcer    Swelling    Vitamin D deficiency    Past Surgical History:  Procedure Laterality Date   ABDOMINAL HYSTERECTOMY  age 60   ANKLE SURGERY     CHOLECYSTECTOMY N/A 04/10/2015   Procedure: LAPAROSCOPIC CHOLECYSTECTOMY WITH INTRAOPERATIVE CHOLANGIOGRAM;  Surgeon: Robert Bellow, MD;  Location: [facility];  Service: General;  Laterality: N/A;   COLONOSCOPY WITH PROPOFOL N/A 07/10/2017   Procedure: COLONOSCOPY WITH PROPOFOL;  Surgeon: Jonathon Bellows, MD;  [facility];  Service: Gastroenterology;  Laterality: N/A;   ESOPHAGOGASTRODUODENOSCOPY N/A 05/12/2015   Procedure: ESOPHAGOGASTRODUODENOSCOPY (EGD);  Surgeon: Hulen Luster, MD;  Location: [place];  Service: Gastroenterology;  Laterality: N/A;   VAGINAL DELIVERY  2   Social History   Socioeconomic History   Marital status: Widowed    Spouse name: Not on file   Number of children:  2   Years of education: Not on file   Highest education level: Not on file  Occupational History   Occupation: Retired  Tobacco Use   Smoking status: Never   Smokeless tobacco: Never  Vaping Use   Vaping Use: Never used  Substance and Sexual Activity   Alcohol use: Hurst   Drug use: Hurst   Sexual activity: Not Currently  Other Topics Concern   Not on file  Social History Narrative   Lives in [place].       Husband passed away 2014-04-12 from PNA.      Work - retired from [employer] - regular; working on [employer]   Exercise - walks occasionally, limited by fatigue. [place]  and takes care of her 13 year old grandchild.    Social Determinants of Health   Financial Resource Strain: Medium Risk   Difficulty of Paying Living Expenses: Somewhat hard  Food Insecurity: Not on file  Transportation Needs: Hurst Transportation Needs   Lack of Transportation (Medical): Hurst   Lack of Transportation (Non-Medical): Hurst  Physical Activity: Not on file  Stress: Hurst Stress Concern Present   Feeling of Stress : Not at all  Social Connections: Moderately Integrated   Frequency of Communication with Friends and Family: More than three times a week   Frequency of Social Gatherings with Friends and Family: More than three times a week   Attends Religious Services: 1 to 4 times per year   Active Member of [employer] or Organizations: Yes   Attends Archivist Meetings: 1 to 4 times per year   Marital Status: Widowed   Hurst Known Allergies Family History  Problem Relation Age of Onset   Diabetes Mother    Cancer Mother        colon and breast   Breast cancer Mother    Liver disease Mother    Cancer Father        Leukemia   Cancer Brother        colon and lung   Colon cancer Brother    Cholelithiasis Daughter    Heart disease Paternal Grandfather    Cancer Other 29       colon   Thyroid cancer Neg Hx     Current Outpatient Medications (Endocrine & Metabolic):    Semaglutide, 1 MG/DOSE, (OZEMPIC, 1 MG/DOSE,) 4 MG/3ML SOPN, Inject 1 mg into the skin once a week.  Current Outpatient Medications (Cardiovascular):    colesevelam (WELCHOL) 625 MG tablet, Take 625 mg by mouth daily as needed.   metolazone (ZAROXOLYN) 5 MG tablet, Take 5 mg by mouth daily.   rosuvastatin (CRESTOR) 5 MG tablet, Take 1 tablet three days per week   torsemide (DEMADEX) 20 MG tablet, Take 20 mg by mouth daily as needed.   Current Outpatient Medications (Analgesics):    acetaminophen (TYLENOL) 650 MG CR tablet, Take 650 mg by mouth every 8 (eight) hours as needed for pain.   diclofenac  (VOLTAREN) 75 MG EC tablet, Take 1 tablet (75 mg total) by mouth 2 (two) times daily.   traMADol (ULTRAM) 50 MG tablet, TAKE 1 TABLET BY MOUTH DAILY AS NEEDED  Current Outpatient Medications (Hematological):    cyanocobalamin (,VITAMIN B-12,) 1000 MCG/ML injection, INJECT 1 ML ONCE PER MONTH  Current Outpatient Medications (Other):    buPROPion (WELLBUTRIN SR) 100 MG 12 hr tablet, Take 1 tablet (100 mg total) by mouth daily.   calcium carbonate (OSCAL) 1500 (  600 Ca) MG TABS tablet, Take 600 mg by mouth daily.   cholecalciferol (VITAMIN D3) 25 MCG (1000 UNIT) tablet, Take 2,000 Units by mouth daily.   esomeprazole (NEXIUM) 40 MG capsule, Take 40 mg by mouth daily as needed.   gabapentin (NEURONTIN) 300 MG capsule, Take 1 capsule (300 mg total) by mouth at bedtime.   ondansetron (ZOFRAN-ODT) 4 MG disintegrating tablet, Take 4 mg by mouth as needed.   potassium chloride (KLOR-CON) 10 MEQ tablet, Take 1 tablet by mouth twice daily. **Take with Metolazone.**   rOPINIRole (REQUIP) 0.25 MG tablet, TAKE 1 TABLET(0.25 MG) BY MOUTH AT BEDTIME AS NEEDED   sennosides-docusate sodium (SENOKOT-S) 8.6-50 MG tablet, Take 1 tablet by mouth daily.   tiZANidine (ZANAFLEX) 2 MG tablet, TAKE 1 TO 2 TABLETS(2 TO 4 MG) BY MOUTH EVERY 8 HOURS AS NEEDED FOR MUSCLE SPASMS   Vitamin E 400 units TABS, Take 400 Units by mouth daily.   Reviewed prior external information including notes and imaging from  primary care provider As well as notes that were available from care everywhere and other healthcare systems.  Past medical history, social, surgical and family history all reviewed in electronic medical record.  Hurst pertanent information unless stated regarding to the chief complaint.   Review of Systems:  Hurst headache, visual changes, nausea, vomiting, diarrhea, constipation, dizziness, abdominal pain, skin rash, fevers, chills, night sweats, weight loss, swollen lymph nodes, body aches, joint swelling, chest pain,  shortness of breath, mood changes. POSITIVE muscle aches  Objective  Blood pressure 102/70, pulse 82, height 5' 1"  (1.549 m), weight 216 lb (98 kg), SpO2 99 %.   General: Hurst apparent distress alert and oriented x3 mood and affect normal, dressed appropriately.  HEENT: Pupils equal, extraocular movements intact  Respiratory: Patient's speak in full sentences and does not appear short of breath  Cardiovascular: Hurst lower extremity edema, non tender, Hurst erythema  Gait mild antalgic Patient's neck exam does have some mild limited range of motion in all planes.  Limited right-sided rotation and right-sided sidebending the most.  Hurst radicular symptoms with Spurling's but still does have discomfort.  5 out of 5 strength of the upper extremities this does show interval improvement noted.  Low back exam does have some mild loss of lordosis noted.   Impression and Recommendations:     The above documentation has been reviewed and is accurate and complete Lyndal Pulley, DO

## 2020-07-22 ENCOUNTER — Other Ambulatory Visit: Payer: Self-pay

## 2020-07-22 ENCOUNTER — Other Ambulatory Visit: Payer: Self-pay | Admitting: Family Medicine

## 2020-07-22 ENCOUNTER — Ambulatory Visit: Payer: Medicare Other | Admitting: Family Medicine

## 2020-07-22 ENCOUNTER — Encounter: Payer: Self-pay | Admitting: Family Medicine

## 2020-07-22 DIAGNOSIS — M5030 Other cervical disc degeneration, unspecified cervical region: Secondary | ICD-10-CM

## 2020-07-22 NOTE — Patient Instructions (Signed)
Exercises Stay active See me again in 2 months  If worsening pain, can consider facet injections

## 2020-07-22 NOTE — Assessment & Plan Note (Signed)
Patient has responded fairly well to the second epidural at this time.  Can consider the possibility of facet injections if this continues to give her some difficulty but likely patient is not having any radicular symptoms.  Home exercises given today.  Discussed icing regimen and home exercises.  Increase activity slowly.  Patient will follow up with me again in 2 months to further evaluate.  Patient knows to call if any worsening pain.

## 2020-07-23 ENCOUNTER — Telehealth: Payer: Medicare Other

## 2020-07-23 ENCOUNTER — Ambulatory Visit (INDEPENDENT_AMBULATORY_CARE_PROVIDER_SITE_OTHER): Payer: Medicare Other | Admitting: Family Medicine

## 2020-07-23 ENCOUNTER — Encounter (INDEPENDENT_AMBULATORY_CARE_PROVIDER_SITE_OTHER): Payer: Self-pay | Admitting: Family Medicine

## 2020-07-23 VITALS — BP 99/63 | HR 72 | Temp 97.8°F | Ht 61.0 in | Wt 206.0 lb

## 2020-07-23 DIAGNOSIS — G8929 Other chronic pain: Secondary | ICD-10-CM

## 2020-07-23 DIAGNOSIS — F3289 Other specified depressive episodes: Secondary | ICD-10-CM

## 2020-07-23 DIAGNOSIS — E8881 Metabolic syndrome: Secondary | ICD-10-CM | POA: Diagnosis not present

## 2020-07-23 DIAGNOSIS — Z6841 Body Mass Index (BMI) 40.0 and over, adult: Secondary | ICD-10-CM | POA: Diagnosis not present

## 2020-07-23 DIAGNOSIS — M542 Cervicalgia: Secondary | ICD-10-CM

## 2020-07-23 MED ORDER — OZEMPIC (1 MG/DOSE) 4 MG/3ML ~~LOC~~ SOPN: 1.0000 mg | PEN_INJECTOR | SUBCUTANEOUS | 0 refills | Status: AC

## 2020-07-23 MED ORDER — BUPROPION HCL ER (XL) 150 MG PO TB24: 150.0000 mg | ORAL_TABLET | Freq: Every day | ORAL | 1 refills | Status: DC

## 2020-07-29 ENCOUNTER — Other Ambulatory Visit: Payer: Self-pay

## 2020-07-29 ENCOUNTER — Encounter: Payer: Self-pay | Admitting: Family

## 2020-07-29 ENCOUNTER — Ambulatory Visit (INDEPENDENT_AMBULATORY_CARE_PROVIDER_SITE_OTHER): Payer: Medicare Other | Admitting: Family

## 2020-07-29 VITALS — BP 114/70 | HR 93 | Temp 98.0°F | Ht 61.0 in | Wt 212.4 lb

## 2020-07-29 DIAGNOSIS — M5010 Cervical disc disorder with radiculopathy, unspecified cervical region: Secondary | ICD-10-CM | POA: Diagnosis not present

## 2020-07-29 DIAGNOSIS — E785 Hyperlipidemia, unspecified: Secondary | ICD-10-CM | POA: Diagnosis not present

## 2020-07-29 DIAGNOSIS — M48062 Spinal stenosis, lumbar region with neurogenic claudication: Secondary | ICD-10-CM | POA: Diagnosis not present

## 2020-07-29 DIAGNOSIS — R21 Rash and other nonspecific skin eruption: Secondary | ICD-10-CM | POA: Diagnosis not present

## 2020-07-29 DIAGNOSIS — M79671 Pain in right foot: Secondary | ICD-10-CM | POA: Diagnosis not present

## 2020-07-29 LAB — COMPREHENSIVE METABOLIC PANEL
ALT: 13 U/L (ref 0–35)
AST: 17 U/L (ref 0–37)
Albumin: 3.9 g/dL (ref 3.5–5.2)
Alkaline Phosphatase: 64 U/L (ref 39–117)
BUN: 18 mg/dL (ref 6–23)
CO2: 27 mEq/L (ref 19–32)
Calcium: 9.6 mg/dL (ref 8.4–10.5)
Chloride: 107 mEq/L (ref 96–112)
Creatinine, Ser: 0.72 mg/dL (ref 0.40–1.20)
GFR: 82.52 mL/min (ref >60.00)
Glucose, Bld: 83 mg/dL (ref 70–99)
Potassium: 4 mEq/L (ref 3.5–5.1)
Sodium: 143 mEq/L (ref 135–145)
Total Bilirubin: 0.6 mg/dL (ref 0.2–1.2)
Total Protein: 6.6 g/dL (ref 6.0–8.3)

## 2020-07-29 MED ORDER — TRAMADOL HCL 50 MG PO TABS: 50.0000 mg | ORAL_TABLET | Freq: Every day | ORAL | 1 refills | Status: DC | PRN

## 2020-07-29 MED ORDER — ROSUVASTATIN CALCIUM 5 MG PO TABS: 5.0000 mg | ORAL_TABLET | Freq: Every day | ORAL | 1 refills | Status: DC

## 2020-07-29 MED ORDER — MELOXICAM 7.5 MG PO TABS: 7.5000 mg | ORAL_TABLET | Freq: Every day | ORAL | 1 refills | Status: DC | PRN

## 2020-07-29 NOTE — Progress Notes (Signed)
Subjective:    Patient ID: Morgan Hurst, female    DOB: 14-May-1945, 75 y.o.   MRN: [id number]  CC: Morgan Hurst is a 75 y.o. female who presents today for an acute visit.    HPI: Right bottom of foot pain x 3 days, improved.  Started when she stepped out of bed. No injury, fall.  Swelling on top of foot, which has since improved.  Feels like pebble under middle of foot.  She walks barefoot all the time.  Pain worse when she takes first step in the mornin.  No numbness, ankle pain, wound, redness.  No pain when sitting, pain when putting weight on foot.  H/o gout.    Requests refill of tramadol. Low back pain well controlled.   She also complains of rash on bilateral lower calves past couple of weeks. Not itching or painful. Scabs.  She would like to see dermatology.   No h/o gib  HLD- compliant with crestor 80m three days per week   HISTORY:  Past Medical History:  Diagnosis Date   Arthritis    lower back, right hip   B12 deficiency    Back pain    Borderline diabetes    PCP STARTED PT ON METFORMIN IN 2016 DUE TO ELEVATED GLUCOSE    Complication of anesthesia    FIGHTING WHEN WAKING UP FROM ANESTHESIA   Constipation    Diabetes mellitus without complication (HCC)    Endometriosis    Fatty liver    Gallbladder problem    GERD (gastroesophageal reflux disease)    Heart murmur    High cholesterol    Hip pain    Joint pain    Kidney stones    Leg weakness    Orthodontics    top front 3 teeth caps and bridge   Prediabetes    RA (rheumatoid arthritis) (HCC)    SOB (shortness of breath)    Stomach ulcer    Swelling    Vitamin D deficiency    Past Surgical History:  Procedure Laterality Date   ABDOMINAL HYSTERECTOMY  age 75  ANKLE SURGERY     CHOLECYSTECTOMY N/A 04/10/2015   Procedure: LAPAROSCOPIC CHOLECYSTECTOMY WITH INTRAOPERATIVE CHOLANGIOGRAM;  Surgeon: JRobert Bellow MD;  Location: [facility];  Service: General;  Laterality: N/A;    COLONOSCOPY WITH PROPOFOL N/A 07/10/2017   Procedure: COLONOSCOPY WITH PROPOFOL;  Surgeon: AJonathon Bellows MD;  [facility];  Service: Gastroenterology;  Laterality: N/A;   ESOPHAGOGASTRODUODENOSCOPY N/A 05/12/2015   Procedure: ESOPHAGOGASTRODUODENOSCOPY (EGD);  Surgeon: PHulen Luster MD;  Location: [facility]  Service: Gastroenterology;  Laterality: N/A;   VAGINAL DELIVERY  2   Family History  Problem Relation Age of Onset   Diabetes Mother    Cancer Mother        colon and breast   Breast cancer Mother    Liver disease Mother    Cancer Father        Leukemia   Cancer Brother        colon and lung   Colon cancer Brother    Cholelithiasis Daughter    Heart disease Paternal Grandfather    Cancer Other 29       colon   Thyroid cancer Neg Hx     Allergies: Patient has no known allergies. Current Outpatient Medications on File Prior to Visit  Medication Sig Dispense Refill   acetaminophen (TYLENOL) 650 MG CR tablet Take 650 mg by mouth every 8 (  eight) hours as needed for pain.     buPROPion (WELLBUTRIN XL) 150 MG 24 hr tablet Take 1 tablet (150 mg total) by mouth daily. 30 tablet 1   calcium carbonate (OSCAL) 1500 (600 Ca) MG TABS tablet Take 600 mg by mouth daily.     cholecalciferol (VITAMIN D3) 25 MCG (1000 UNIT) tablet Take 2,000 Units by mouth daily.     colesevelam (WELCHOL) 625 MG tablet Take 625 mg by mouth daily as needed.     cyanocobalamin (,VITAMIN B-12,) 1000 MCG/ML injection INJECT 1 ML ONCE PER MONTH 1 mL 15   diclofenac (VOLTAREN) 75 MG EC tablet Take 1 tablet (75 mg total) by mouth 2 (two) times daily. 60 tablet 0   esomeprazole (NEXIUM) 40 MG capsule Take 40 mg by mouth daily as needed.     gabapentin (NEURONTIN) 300 MG capsule TAKE 1 CAPSULE(300 MG) BY MOUTH AT BEDTIME 30 capsule 0   metolazone (ZAROXOLYN) 5 MG tablet Take 5 mg by mouth daily.     ondansetron (ZOFRAN-ODT) 4 MG disintegrating tablet Take 4 mg by mouth as needed.     potassium  chloride (KLOR-CON) 10 MEQ tablet Take 1 tablet by mouth twice daily. **Take with Metolazone.** 180 tablet 0   rOPINIRole (REQUIP) 0.25 MG tablet TAKE 1 TABLET(0.25 MG) BY MOUTH AT BEDTIME AS NEEDED 90 tablet 0   Semaglutide, 1 MG/DOSE, (OZEMPIC, 1 MG/DOSE,) 4 MG/3ML SOPN Inject 1 mg into the skin once a week. 9 mL 0   sennosides-docusate sodium (SENOKOT-S) 8.6-50 MG tablet Take 1 tablet by mouth daily.     tiZANidine (ZANAFLEX) 2 MG tablet TAKE 1 TO 2 TABLETS(2 TO 4 MG) BY MOUTH EVERY 8 HOURS AS NEEDED FOR MUSCLE SPASMS 60 tablet 1   torsemide (DEMADEX) 20 MG tablet Take 20 mg by mouth daily as needed.     Vitamin E 400 units TABS Take 400 Units by mouth daily.     No current facility-administered medications on file prior to visit.    Social History   Tobacco Use   Smoking status: Never   Smokeless tobacco: Never  Vaping Use   Vaping Use: Never used  Substance Use Topics   Alcohol use: No   Drug use: No    Review of Systems  Constitutional:  Negative for chills and fever.  Respiratory:  Negative for cough.   Cardiovascular:  Negative for chest pain, palpitations and leg swelling (resolved).  Gastrointestinal:  Negative for nausea and vomiting.  Musculoskeletal:  Positive for arthralgias.  Neurological:  Negative for numbness.     Objective:    BP 114/70 (BP Location: Left Arm, Patient Position: Sitting, Cuff Size: Large)   Pulse 93   Temp 98 F (36.7 C) (Oral)   Ht 5' 1"  (1.549 m)   Wt 212 lb 6.4 oz (96.3 kg)   SpO2 98%   BMI 40.13 kg/m    Physical Exam Vitals reviewed.  Constitutional:      Appearance: She is well-developed.  Eyes:     Conjunctiva/sclera: Conjunctivae normal.  Cardiovascular:     Rate and Rhythm: Normal rate and regular rhythm.     Pulses: Normal pulses.     Heart sounds: Normal heart sounds.  Pulmonary:     Effort: Pulmonary effort is normal.     Breath sounds: Normal breath sounds. No wheezing, rhonchi or rales.  Musculoskeletal:      Right lower leg: No edema.     Left lower leg: No edema.  Right foot: Normal range of motion.  Feet:     Right foot:     Skin integrity: No skin breakdown, erythema or warmth.     Comments: Pain right ventral aspect of mid foot. No palpable mass. Sensation intact. No pain with dorsiflexion or plantarflexion.No erythema, increased heat or pain with palpation of MTP joint.  Skin:    General: Skin is warm and dry.  Neurological:     Mental Status: She is alert.  Psychiatric:        Speech: Speech normal.        Behavior: Behavior normal.        Thought Content: Thought content normal.       Assessment & Plan:   Problem List Items Addressed This Visit       Other   Hyperlipidemia    Uncontrolled. Advised to increase crestor to once daily.       Relevant Medications   rosuvastatin (CRESTOR) 5 MG tablet   Right foot pain    Symptoms consistent with plantar fasciitis. Advised icing regimen, mobic prn, supportive shoes.  Presentation not consistent with gout. She declines baseline xr of foot today in the absence of injury.        Relevant Medications   meloxicam (MOBIC) 7.5 MG tablet   Spinal stenosis, lumbar region, with neurogenic claudication    Chronic stable. Tramadol 42m refilled. I looked up patient on [place] Controlled Substances Reporting System PMP AWARE and saw no activity that raised concern of inappropriate use.         Relevant Medications   traMADol (ULTRAM) 50 MG tablet   meloxicam (MOBIC) 7.5 MG tablet   Other Visit Diagnoses     Rash    -  Primary   Relevant Orders   Ambulatory referral to Dermatology   Cervical disc disorder with radiculopathy of cervical region              I have changed BLucita Lora Hurst's rosuvastatin and traMADol. I am also having her start on meloxicam. Additionally, I am having her maintain her esomeprazole, colesevelam, cholecalciferol, calcium carbonate, acetaminophen, potassium chloride, torsemide, diclofenac,  ondansetron, cyanocobalamin, rOPINIRole, metolazone, Vitamin E, sennosides-docusate sodium, tiZANidine, gabapentin, Ozempic (1 MG/DOSE), and buPROPion.   Meds ordered this encounter  Medications   rosuvastatin (CRESTOR) 5 MG tablet    Sig: Take 1 tablet (5 mg total) by mouth daily.    Dispense:  90 tablet    Refill:  1    Order Specific Question:   Supervising Provider    Answer:   TDeborra MedinaL [[id number]]   traMADol (ULTRAM) 50 MG tablet    Sig: Take 1 tablet (50 mg total) by mouth daily as needed.    Dispense:  30 tablet    Refill:  1    Order Specific Question:   Supervising Provider    Answer:   TDeborra MedinaL [[id number]]   meloxicam (MOBIC) 7.5 MG tablet    Sig: Take 1 tablet (7.5 mg total) by mouth daily as needed for pain.    Dispense:  30 tablet    Refill:  1    Order Specific Question:   Supervising Provider    Answer:   TCrecencio Mc[[id number]]    Return precautions given.   Risks, benefits, and alternatives of the medications and treatment plan prescribed today were discussed, and patient expressed understanding.   Education regarding symptom management and diagnosis given to patient on AVS.  Continue to follow  with Burnard Hawthorne, FNP for routine health maintenance.   [name] and I agreed with plan.   Mable Paris, FNP

## 2020-07-29 NOTE — Assessment & Plan Note (Signed)
Uncontrolled. Advised to increase crestor to once daily.

## 2020-07-29 NOTE — Patient Instructions (Signed)
Suspect plantar fascitis Frozen water bottle under foot 3 x per day SUPPORTIVE shoes very important  Mobic ( anti inflammatory) as needed  Plantar Fasciitis  Plantar fasciitis is a painful foot condition that affects the heel. It occurs when the band of tissue that connects the toes to the heel bone (plantar fascia) becomes irritated. This can happen as the result of exercising too much or doing other repetitive activities (overuse injury). Plantar fasciitis can cause mild irritation to severe pain that makes it difficult to walk or move. The pain is usually worse in the morning after sleeping, or after sitting or lying down for a period of time. Pain may also beworse after long periods of walking or standing. What are the causes? This condition may be caused by: Standing for long periods of time. Wearing shoes that do not have good arch support. Doing activities that put stress on joints (high-impact activities). This includes ballet and exercise that makes your heart beat faster (aerobic exercise), such as running. Being overweight. An abnormal way of walking (gait). Tight muscles in the back of your lower leg (calf). High arches in your feet or flat feet. Starting a new athletic activity. What are the signs or symptoms? The main symptom of this condition is heel pain. Pain may get worse after the following: Taking the first steps after a time of rest, especially in the morning after awakening, or after you have been sitting or lying down for a while. Long periods of standing still. Pain may decrease after 30-45 minutes of activity, such as gentle walking. How is this diagnosed? This condition may be diagnosed based on your medical history, a physical exam, and your symptoms. Your health care provider will check for: A tender area on the bottom of your foot. A high arch in your foot or flat feet. Pain when you move your foot. Difficulty moving your foot. You may have imaging tests to  confirm the diagnosis, such as: X-rays. Ultrasound. MRI. How is this treated? Treatment for plantar fasciitis depends on how severe your condition is. Treatment may include: Rest, ice, pressure (compression), and raising (elevating) the affected foot. This is called RICE therapy. Your health care provider may recommend RICE therapy along with over-the-counter pain medicines to manage your pain. Exercises to stretch your calves and your plantar fascia. A splint that holds your foot in a stretched, upward position while you sleep (night splint). Physical therapy to relieve symptoms and prevent problems in the future. Injections of steroid medicine (cortisone) to relieve pain and inflammation. Stimulating your plantar fascia with electrical impulses (extracorporeal shock wave therapy). This is usually the last treatment option before surgery. Surgery, if other treatments have not worked after 12 months. Follow these instructions at home: Managing pain, stiffness, and swelling  If directed, put ice on the painful area. To do this: Put ice in a plastic bag, or use a frozen bottle of water. Place a towel between your skin and the bag or bottle. Roll the bottom of your foot over the bag or bottle. Do this for 20 minutes, 2-3 times a day. Wear athletic shoes that have air-sole or gel-sole cushions, or try soft shoe inserts that are designed for plantar fasciitis. Elevate your foot above the level of your heart while you are sitting or lying down.  Activity Avoid activities that cause pain. Ask your health care provider what activities are safe for you. Do physical therapy exercises and stretches as told by your health care provider. Try  activities and forms of exercise that are easier on your joints (low impact). Examples include swimming, water aerobics, and biking. General instructions Take over-the-counter and prescription medicines only as told by your health care provider. Wear a night  splint while sleeping, if told by your health care provider. Loosen the splint if your toes tingle, become numb, or turn cold and blue. Maintain a healthy weight, or work with your health care provider to lose weight as needed. Keep all follow-up visits. This is important. Contact a health care provider if you have: Symptoms that do not go away with home treatment. Pain that gets worse. Pain that affects your ability to move or do daily activities. Summary Plantar fasciitis is a painful foot condition that affects the heel. It occurs when the band of tissue that connects the toes to the heel bone (plantar fascia) becomes irritated. Heel pain is the main symptom of this condition. It may get worse after exercising too much or standing still for a long time. Treatment varies, but it usually starts with rest, ice, pressure (compression), and raising (elevating) the affected foot. This is called RICE therapy. Over-the-counter medicines can also be used to manage pain. This information is not intended to replace advice given to you by your health care provider. Make sure you discuss any questions you have with your healthcare provider. Document Revised: 04/22/2019 Document Reviewed: 04/22/2019 Elsevier Patient Education  2022 [employer].

## 2020-07-29 NOTE — Assessment & Plan Note (Signed)
Chronic stable. Tramadol 80m refilled. I looked up patient on  Controlled Substances Reporting System PMP AWARE and saw no activity that raised concern of inappropriate use.

## 2020-07-29 NOTE — Assessment & Plan Note (Signed)
Symptoms consistent with plantar fasciitis. Advised icing regimen, mobic prn, supportive shoes.  Presentation not consistent with gout. She declines baseline xr of foot today in the absence of injury.

## 2020-07-31 ENCOUNTER — Other Ambulatory Visit: Payer: Medicare Other

## 2020-08-05 NOTE — Progress Notes (Signed)
Chief Complaint:   OBESITY Morgan Hurst is here to discuss her progress with her obesity treatment plan along with follow-up of her obesity related diagnoses.   Today's visit was #: 15 Starting weight: 213 lbs Starting date: 06/30/2019 Today's weight: 206 lbs Today's date: 07/23/2020 Weight change since last visit: 4 lbs Total lbs lost to date: 7 lbs Body mass index is 38.92 kg/m.  Total weight loss percentage to date: -3.29%  Interim History:  Morgan Hurst says she is losing motivation.  She endorses some aversions to meat.  She says she has been making her own protein ice cream.  Current Meal Plan: following a lower carbohydrate, vegetable and lean protein rich diet plan for 100% of the time.  Current Exercise Plan: None. Current Anti-Obesity Medications: Ozempic 1 mg subcutaneously weekly. Side effects: None.  Assessment/Plan:   Meds ordered this encounter  Medications   Semaglutide, 1 MG/DOSE, (OZEMPIC, 1 MG/DOSE,) 4 MG/3ML SOPN    Sig: Inject 1 mg into the skin once a week.    Dispense:  9 mL    Refill:  0   buPROPion (WELLBUTRIN XL) 150 MG 24 hr tablet    Sig: Take 1 tablet (150 mg total) by mouth daily.    Dispense:  30 tablet    Refill:  1   1. Metabolic syndrome Starting goal: Lose 7-10% of starting weight. She will continue to focus on protein-rich, low simple carbohydrate foods. We reviewed the importance of hydration, regular exercise for stress reduction, and restorative sleep.  We will continue to check lab work every 3 months, with 10% weight loss, or should any other concerns arise.  - Refill Semaglutide, 1 MG/DOSE, (OZEMPIC, 1 MG/DOSE,) 4 MG/3ML SOPN; Inject 1 mg into the skin once a week.  Dispense: 9 mL; Refill: 0  2. Chronic neck pain Will continue to monitor as it relates to her weight loss journey.  3. Emotional eating tendencies Morgan Hurst is taking Wellbutrin 100 mg daily.  Plan:  Increase Wellbutrin to Wellbutrin XL 150 mg daily.  - Increase and refill  buPROPion (WELLBUTRIN XL) 150 MG 24 hr tablet; Take 1 tablet (150 mg total) by mouth daily.  Dispense: 30 tablet; Refill: 1  4. Obesity, current BMI 39  Course: Morgan Hurst is currently in the action stage of change. As such, her goal is to continue with weight loss efforts.   Nutrition goals: She has agreed to following a lower carbohydrate, vegetable and lean protein rich diet plan.   Exercise goals:  Increase activity.  Behavioral modification strategies: increasing lean protein intake, decreasing simple carbohydrates, increasing vegetables, increasing water intake, and emotional eating strategies.  Morgan Hurst has agreed to follow-up with our clinic in 4 weeks. She was informed of the importance of frequent follow-up visits to maximize her success with intensive lifestyle modifications for her multiple health conditions.   Objective:   Blood pressure 99/63, pulse 72, temperature 97.8 F (36.6 C), temperature source Oral, height 5' 1"  (1.549 m), weight 206 lb (93.4 kg), SpO2 98 %. Body mass index is 38.92 kg/m.  General: Cooperative, alert, well developed, in no acute distress. HEENT: Conjunctivae and lids unremarkable. Cardiovascular: Regular rhythm.  Lungs: Normal work of breathing. Neurologic: No focal deficits.   Lab Results  Component Value Date   CREATININE 0.72 07/29/2020   BUN 18 07/29/2020   NA 143 07/29/2020   K 4.0 07/29/2020   CL 107 07/29/2020   CO2 27 07/29/2020   Lab Results  Component Value Date  ALT 13 07/29/2020   AST 17 07/29/2020   ALKPHOS 64 07/29/2020   BILITOT 0.6 07/29/2020   Lab Results  Component Value Date   HGBA1C 5.5 07/14/2020   HGBA1C 5.2 09/25/2019   HGBA1C 5.9 05/15/2019   HGBA1C 6.0 09/04/2018   HGBA1C 5.9 12/25/2017   Lab Results  Component Value Date   INSULIN 20.9 06/20/2019   Lab Results  Component Value Date   TSH 3.060 06/20/2019   Lab Results  Component Value Date   CHOL 182 03/26/2020   HDL 48.60 03/26/2020   LDLCALC 114  (H) 03/26/2020   LDLDIRECT 177.7 12/25/2012   TRIG 94.0 03/26/2020   CHOLHDL 4 03/26/2020   Lab Results  Component Value Date   VD25OH 35.49 09/04/2018   VD25OH 27.53 (L) 04/02/2018   VD25OH 25.42 (L) 12/25/2017   Lab Results  Component Value Date   WBC 7.8 06/20/2019   HGB 14.9 06/20/2019   HCT 44.5 06/20/2019   MCV 89 06/20/2019   PLT 189 06/20/2019   Lab Results  Component Value Date   IRON 59 09/07/2018   FERRITIN 87.1 09/07/2018   Obesity Behavioral Intervention:   Approximately 15 minutes were spent on the discussion below.  ASK: We discussed the diagnosis of obesity with Morgan Hurst today and Morgan Hurst agreed to give [place] permission to discuss obesity behavioral modification therapy today.  ASSESS: Morgan Hurst has the diagnosis of obesity and her BMI today is 39.0. [name] is in the action stage of change.   ADVISE: [name] was educated on the multiple health risks of obesity as well as the benefit of weight loss to improve her health. She was advised of the need for long term treatment and the importance of lifestyle modifications to improve her current health and to decrease her risk of future health problems.  AGREE: Multiple dietary modification options and treatment options were discussed and Morgan Hurst agreed to follow the recommendations documented in the above note.  ARRANGE: Morgan Hurst was educated on the importance of frequent visits to treat obesity as outlined per CMS and USPSTF guidelines and agreed to schedule her next follow up appointment today.  Attestation Statements:   Reviewed by clinician on day of visit: allergies, medications, problem list, medical history, surgical history, family history, social history, and previous encounter notes.  I, Water quality scientist, CMA, am acting as transcriptionist for Briscoe Deutscher, DO  I have reviewed the above documentation for accuracy and completeness, and I agree with the above. Briscoe Deutscher, DO

## 2020-08-13 ENCOUNTER — Ambulatory Visit (INDEPENDENT_AMBULATORY_CARE_PROVIDER_SITE_OTHER): Payer: Medicare Other | Admitting: Family Medicine

## 2020-08-18 ENCOUNTER — Telehealth: Payer: Self-pay

## 2020-08-18 NOTE — Telephone Encounter (Signed)
I called patient to let her know that we received her patient assistance medication the 4 pens of Ozempic 4 mg/3 mL pens. Patient will come & pick up as soon as she can.

## 2020-08-20 NOTE — Telephone Encounter (Signed)
Attempted to contact patient to remind to come pick up PAP supply. Unable to speak to patient or leave voicemail on either line. Sent MyChart message reminder.

## 2020-08-25 ENCOUNTER — Telehealth: Payer: Self-pay

## 2020-08-25 NOTE — Telephone Encounter (Signed)
I called patient to let let her know that we has received her 5 boxed of the 2 mg Ozempic pens. Pt stated that she would pick up tomorrow. They are labeled and placed in refrigerator.

## 2020-08-27 ENCOUNTER — Ambulatory Visit: Payer: Medicare Other

## 2020-08-27 ENCOUNTER — Telehealth: Payer: Self-pay

## 2020-08-27 NOTE — Telephone Encounter (Signed)
Unable to reach patient for scheduled AWV on preferred number. Unable to leave message, voicemail not set up. Reschedule appointment.

## 2020-09-02 ENCOUNTER — Encounter (INDEPENDENT_AMBULATORY_CARE_PROVIDER_SITE_OTHER): Payer: Self-pay | Admitting: Family Medicine

## 2020-09-02 ENCOUNTER — Telehealth (INDEPENDENT_AMBULATORY_CARE_PROVIDER_SITE_OTHER): Payer: Medicare Other | Admitting: Family Medicine

## 2020-09-02 VITALS — Ht 61.0 in | Wt 208.0 lb

## 2020-09-02 DIAGNOSIS — Z6841 Body Mass Index (BMI) 40.0 and over, adult: Secondary | ICD-10-CM

## 2020-09-02 DIAGNOSIS — R52 Pain, unspecified: Secondary | ICD-10-CM

## 2020-09-02 DIAGNOSIS — M25562 Pain in left knee: Secondary | ICD-10-CM

## 2020-09-02 DIAGNOSIS — G8929 Other chronic pain: Secondary | ICD-10-CM | POA: Diagnosis not present

## 2020-09-02 DIAGNOSIS — E1169 Type 2 diabetes mellitus with other specified complication: Secondary | ICD-10-CM

## 2020-09-02 MED ORDER — DICLOFENAC SODIUM 75 MG PO TBEC: 75.0000 mg | DELAYED_RELEASE_TABLET | Freq: Two times a day (BID) | ORAL | 3 refills | Status: DC

## 2020-09-03 ENCOUNTER — Ambulatory Visit: Payer: Medicare Other | Admitting: Family Medicine

## 2020-09-03 ENCOUNTER — Ambulatory Visit (INDEPENDENT_AMBULATORY_CARE_PROVIDER_SITE_OTHER): Payer: Medicare Other

## 2020-09-03 ENCOUNTER — Ambulatory Visit: Payer: Self-pay

## 2020-09-03 ENCOUNTER — Encounter: Payer: Self-pay | Admitting: Family Medicine

## 2020-09-03 ENCOUNTER — Other Ambulatory Visit: Payer: Self-pay

## 2020-09-03 VITALS — BP 122/82 | HR 81 | Ht 61.0 in | Wt 210.0 lb

## 2020-09-03 DIAGNOSIS — M25562 Pain in left knee: Secondary | ICD-10-CM | POA: Diagnosis not present

## 2020-09-03 NOTE — Progress Notes (Signed)
[place] Phone: [phone] Subjective:    I'm seeing this patient by the request  of:  Burnard Hawthorne, FNP  CC: Left knee pain  [id number]  Morgan MAZZAFERRO is a 75 y.o. female coming in with complaint of L knee pain that started 03-28-2022. Patient locates pain to around patella and the back of the knee.  Patient was mowing the lawn and took a step down and felt a catch in her Left knee that was sore initially, but that evening when getting the mail she twisted and leg giveaway and almost fell into the highway. Unable to put pressure on that Left leg. Patient describes pain as sharp and has weakness and has to ambulate with a walker. Patient has been using ice, topical rub, elevation, and brace to help with the pain and weakness. No numbness and tingling.        Past Medical History:  Diagnosis Date   Arthritis    lower back, right hip   B12 deficiency    Back pain    Borderline diabetes    PCP STARTED PT ON METFORMIN IN 2014-03-28 DUE TO ELEVATED GLUCOSE    Complication of anesthesia    FIGHTING WHEN WAKING UP FROM ANESTHESIA   Constipation    Diabetes mellitus without complication (HCC)    Endometriosis    Fatty liver    Gallbladder problem    GERD (gastroesophageal reflux disease)    Heart murmur    High cholesterol    Hip pain    Joint pain    Kidney stones    Leg weakness    Orthodontics    top front 3 teeth caps and bridge   Prediabetes    RA (rheumatoid arthritis) (HCC)    SOB (shortness of breath)    Stomach ulcer    Swelling    Vitamin D deficiency    Past Surgical History:  Procedure Laterality Date   ABDOMINAL HYSTERECTOMY  age 74   ANKLE SURGERY     CHOLECYSTECTOMY N/A 04/10/2015   Procedure: LAPAROSCOPIC CHOLECYSTECTOMY WITH INTRAOPERATIVE CHOLANGIOGRAM;  Surgeon: Robert Bellow, MD;  Location: [facility] ORS;  Service: General;  Laterality: N/A;   COLONOSCOPY WITH PROPOFOL N/A  07/10/2017   Procedure: COLONOSCOPY WITH PROPOFOL;  Surgeon: Jonathon Bellows, MD;  Location: [facility] ENDOSCOPY;  Service: Gastroenterology;  Laterality: N/A;   ESOPHAGOGASTRODUODENOSCOPY N/A 05/12/2015   Procedure: ESOPHAGOGASTRODUODENOSCOPY (EGD);  Surgeon: Hulen Luster, MD;  Location: [place];  Service: Gastroenterology;  Laterality: N/A;   VAGINAL DELIVERY  2   Social History   Socioeconomic History   Marital status: Widowed    Spouse name: Not on file   Number of children: 2   Years of education: Not on file   Highest education level: Not on file  Occupational History   Occupation: Retired  Tobacco Use   Smoking status: Never   Smokeless tobacco: Never  Vaping Use   Vaping Use: Never used  Substance and Sexual Activity   Alcohol use: No   Drug use: No   Sexual activity: Not Currently  Other Topics Concern   Not on file  Social History Narrative   Lives in [place].       Husband passed away Mar 28, 2014 from PNA.      Work - retired from [employer] - regular; working on [employer]   Exercise - walks occasionally, limited by  fatigue. 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    Social Determinants of Health   Financial Resource Strain: Medium Risk   Difficulty of Paying Living Expenses: Somewhat hard  Food Insecurity: Not on file  Transportation Needs: Not on file  Physical Activity: Not on file  Stress: Not on file  Social Connections: Not on file   No Known Allergies Family History  Problem Relation Age of Onset   Diabetes Mother    Cancer Mother        colon and breast   Breast cancer Mother    Liver disease Mother    Cancer Father        Leukemia   Cancer Brother        colon and lung   Colon cancer Brother    Cholelithiasis Daughter    Heart disease Paternal Grandfather    Cancer Other 29       colon   Thyroid cancer Neg Hx     Current Outpatient Medications (Endocrine & Metabolic):    Semaglutide, 1 MG/DOSE, (OZEMPIC, 1  MG/DOSE,) 4 MG/3ML SOPN, Inject 1 mg into the skin once a week.  Current Outpatient Medications (Cardiovascular):    colesevelam (WELCHOL) 625 MG tablet, Take 625 mg by mouth daily as needed.   metolazone (ZAROXOLYN) 5 MG tablet, Take 5 mg by mouth daily.   rosuvastatin (CRESTOR) 5 MG tablet, Take 1 tablet (5 mg total) by mouth daily.   torsemide (DEMADEX) 20 MG tablet, Take 20 mg by mouth daily as needed.   Current Outpatient Medications (Analgesics):    acetaminophen (TYLENOL) 650 MG CR tablet, Take 650 mg by mouth every 8 (eight) hours as needed for pain.   diclofenac (VOLTAREN) 75 MG EC tablet, Take 1 tablet (75 mg total) by mouth 2 (two) times daily.   traMADol (ULTRAM) 50 MG tablet, Take 1 tablet (50 mg total) by mouth daily as needed.   meloxicam (MOBIC) 7.5 MG tablet, Take 1 tablet (7.5 mg total) by mouth daily as needed for pain. (Patient not taking: Reported on 09/03/2020)  Current Outpatient Medications (Hematological):    cyanocobalamin (,VITAMIN B-12,) 1000 MCG/ML injection, INJECT 1 ML ONCE PER MONTH  Current Outpatient Medications (Other):    buPROPion (WELLBUTRIN XL) 150 MG 24 hr tablet, Take 1 tablet (150 mg total) by mouth daily.   calcium carbonate (OSCAL) 1500 (600 Ca) MG TABS tablet, Take 600 mg by mouth daily.   cholecalciferol (VITAMIN D3) 25 MCG (1000 UNIT) tablet, Take 2,000 Units by mouth daily.   esomeprazole (NEXIUM) 40 MG capsule, Take 40 mg by mouth daily as needed.   gabapentin (NEURONTIN) 300 MG capsule, TAKE 1 CAPSULE(300 MG) BY MOUTH AT BEDTIME   ondansetron (ZOFRAN-ODT) 4 MG disintegrating tablet, Take 4 mg by mouth as needed.   potassium chloride (KLOR-CON) 10 MEQ tablet, Take 1 tablet by mouth twice daily. **Take with Metolazone.**   rOPINIRole (REQUIP) 0.25 MG tablet, TAKE 1 TABLET(0.25 MG) BY MOUTH AT BEDTIME AS NEEDED   sennosides-docusate sodium (SENOKOT-S) 8.6-50 MG tablet, Take 1 tablet by mouth daily.   tiZANidine (ZANAFLEX) 2 MG tablet, TAKE 1  TO 2 TABLETS(2 TO 4 MG) BY MOUTH EVERY 8 HOURS AS NEEDED FOR MUSCLE SPASMS   Vitamin E 400 units TABS, Take 400 Units by mouth daily.   Reviewed prior external information including notes and imaging from  primary care provider As well as notes that were available from care everywhere and other healthcare systems.  Past  medical history, social, surgical and family history all reviewed in electronic medical record.  No pertanent information unless stated regarding to the chief complaint.   Review of Systems:  No headache, visual changes, nausea, vomiting, diarrhea, constipation, dizziness, abdominal pain, skin rash, fevers, chills, night sweats, weight loss, swollen lymph nodes, body aches, joint swelling, chest pain, shortness of breath, mood changes. POSITIVE muscle aches  Objective  Blood pressure 122/82, pulse 81, height 5' 1"  (1.549 m), weight 210 lb (95.3 kg), SpO2 96 %.   General: No apparent distress alert and oriented x3 mood and affect normal, dressed appropriately.  HEENT: Pupils equal, extraocular movements intact  Respiratory: Patient's speak in full sentences and does not appear short of breath  Cardiovascular: No lower extremity edema, non tender, no erythema  Gait severely antalgic using a rolling walker to ambulate. Left knee exam does have effusion noted.  Lacks last 10 degrees of extension and only 95 degrees of flexion.  Diffuse tenderness noted of the pain of the knee.  LCL on the left side does seem to be mildly lax compared to contralateral side   Limited muscular skeletal ultrasound was performed and interpreted by Hulan Saas, M  Limited musculoskeletal ultrasound shows the patient's medial meniscus does have displacement noted.  Approximately 25% displaced.  Patient does have an effusion noted of the patellofemoral joint noted.  Mild to moderate narrowing of the patellofemoral joint. Impression: Acute meniscal tear with questionable moderate arthritic changes and  questionable LCL difficulty   Impression and Recommendations:     The above documentation has been reviewed and is accurate and complete Lyndal Pulley, DO

## 2020-09-03 NOTE — Assessment & Plan Note (Addendum)
Acute left knee pain.  Seems to be more secondary to a meniscal tear.  Does have some mild displacement noted of the medial meniscus.  Patient does have a small effusion noted as well.  Given injection today that hopefully will be beneficial.  Due to patient's age and comorbidities patient would like to avoid surgery if possible.  If any worsening pain or instability we do need to consider advanced imaging.  X-rays today did not show any significant arthritis otherwise there is mild to moderate.  Follow-up again in 2 to 3 weeks.  Knee brace given today.

## 2020-09-03 NOTE — Patient Instructions (Addendum)
Good to see you  Injection given in Left knee today Exercises given do not start for 10 days Brace given to help with stability of the knee  See me again in 6 weeks

## 2020-09-07 ENCOUNTER — Encounter: Payer: Self-pay | Admitting: Family

## 2020-09-07 NOTE — Progress Notes (Signed)
Chief Complaint:   TeleHealth Visit:  Due to the COVID-19 pandemic, this visit was completed with telemedicine (audio/video) technology to reduce patient and provider exposure as well as to preserve personal protective equipment.   [name] has verbally consented to this TeleHealth visit. The patient is located at home, the provider is located at the [facility] office. The participants in this visit include the listed provider and patient and. The visit was conducted today via [employer]. Time 32 minutes.  OBESITY Morgan Hurst is here to discuss her progress with her obesity treatment plan along with follow-up of her obesity related diagnoses.   Today's visit was #: 16 Starting weight: 213 lbs Starting date: 06/30/2019 Today's date: 09/02/2020 Body mass index is 39.3 kg/m.   Current Meal Plan: the Very Low Calorie Protein Sparing Modified Fast Plan for 50-60% of the time.  Current Exercise Plan: None. Current Anti-Obesity Medications: Ozempic 1 mg subcutaneously weekly. Side effects: None.  Interim History:  Morgan Hurst fell and hurt her left knee recently.  She will see Dr. [name] tomorrow morning.  Assessment/Plan:   1. Type 2 diabetes mellitus with other specified complication, without long-term current use of insulin (HCC) Diabetes Mellitus: At goal. Medication: Ozempic 1 mg subcutaneously weekly. Issues reviewed: blood sugar goals, complications of diabetes mellitus, hypoglycemia prevention and treatment, exercise, and nutrition.   Plan: The patient will continue to focus on protein-rich, low simple carbohydrate foods. We reviewed the importance of hydration, regular exercise for stress reduction, and restorative sleep.   Lab Results  Component Value Date   HGBA1C 5.5 07/14/2020   HGBA1C 5.2 09/25/2019   HGBA1C 5.9 05/15/2019   Lab Results  Component Value Date   MICROALBUR 1.7 03/26/2020   LDLCALC 114 (H) 03/26/2020   CREATININE 0.72 07/29/2020   2. Chronic pain of  multiple sites Danecia is being followed by Dr. [name]. She is taking diclofenac 75 mg twice daily.  Will refill today.  - Refill diclofenac (VOLTAREN) 75 MG EC tablet; Take 1 tablet (75 mg total) by mouth 2 (two) times daily.  Dispense: 60 tablet; Refill: 3  3. Acute pain of left knee Morgan Hurst recently hurt her left knee and has an appointment with Dr. [name] tomorrow morning.   4. Obesity, current BMI 39  Course: Morgan Hurst is currently in the action stage of change. As such, her goal is to continue with weight loss efforts.   Nutrition goals: She has agreed to the Very Low Calorie Protein Sparing Modified Fast Plan.   Exercise goals: No exercise has been prescribed at this time.  Behavioral modification strategies: increasing lean protein intake, decreasing simple carbohydrates, increasing vegetables, and increasing water intake.  [name] has agreed to follow-up with our clinic in 4 weeks. She was informed of the importance of frequent follow-up visits to maximize her success with intensive lifestyle modifications for her multiple health conditions.   Objective:   Height 5' 1"  (1.549 m), weight 208 lb (94.3 kg). Body mass index is 39.3 kg/m.  General: Cooperative, alert, well developed, in no acute distress. HEENT: Conjunctivae and lids unremarkable. Cardiovascular: Regular rhythm.  Lungs: Normal work of breathing. Neurologic: No focal deficits.   Lab Results  Component Value Date   CREATININE 0.72 07/29/2020   BUN 18 07/29/2020   NA 143 07/29/2020   K 4.0 07/29/2020   CL 107 07/29/2020   CO2 27 07/29/2020   Lab Results  Component Value Date   ALT 13 07/29/2020   AST 17 07/29/2020  ALKPHOS 64 07/29/2020   BILITOT 0.6 07/29/2020   Lab Results  Component Value Date   HGBA1C 5.5 07/14/2020   HGBA1C 5.2 09/25/2019   HGBA1C 5.9 05/15/2019   HGBA1C 6.0 09/04/2018   HGBA1C 5.9 12/25/2017   Lab Results  Component Value Date   INSULIN 20.9 06/20/2019   Lab Results  Component  Value Date   TSH 3.060 06/20/2019   Lab Results  Component Value Date   CHOL 182 03/26/2020   HDL 48.60 03/26/2020   LDLCALC 114 (H) 03/26/2020   LDLDIRECT 177.7 12/25/2012   TRIG 94.0 03/26/2020   CHOLHDL 4 03/26/2020   Lab Results  Component Value Date   VD25OH 35.49 09/04/2018   VD25OH 27.53 (L) 04/02/2018   VD25OH 25.42 (L) 12/25/2017   Lab Results  Component Value Date   WBC 7.8 06/20/2019   HGB 14.9 06/20/2019   HCT 44.5 06/20/2019   MCV 89 06/20/2019   PLT 189 06/20/2019   Lab Results  Component Value Date   IRON 59 09/07/2018   FERRITIN 87.1 09/07/2018   Attestation Statements:   Reviewed by clinician on day of visit: allergies, medications, problem list, medical history, surgical history, family history, social history, and previous encounter notes.  I, Water quality scientist, CMA, am acting as transcriptionist for Briscoe Deutscher, DO  I have reviewed the above documentation for accuracy and completeness, and I agree with the above. Briscoe Deutscher, DO

## 2020-09-08 ENCOUNTER — Ambulatory Visit: Payer: Medicare Other | Admitting: Family Medicine

## 2020-09-17 ENCOUNTER — Ambulatory Visit: Payer: Medicare Other

## 2020-09-22 NOTE — Progress Notes (Signed)
[place] Phone: [phone] Subjective:   [name], am serving as a scribe for Dr. Hulan Saas.  This visit occurred during the SARS-CoV-2 public health emergency.  Safety protocols were in place, including screening questions prior to the visit, additional usage of staff PPE, and extensive cleaning of exam room while observing appropriate contact time as indicated for disinfecting solutions.   I'm seeing this patient by the request  of:  Burnard Hawthorne, FNP  CC: left knee pain   [id number]  09/03/2020 Acute left knee pain.  Seems to be more secondary to a meniscal tear.  Does have some mild displacement noted of the medial meniscus.  Patient does have a small effusion noted as well.  Given injection today that hopefully will be beneficial.  Due to patient's age and comorbidities patient would like to avoid surgery if possible.  If any worsening pain or instability we do need to consider advanced imaging.  X-rays today did not show any significant arthritis otherwise there is mild to moderate.  Follow-up again in 2 to 3 weeks.  Knee brace given today.  Update 09/23/2020 Morgan Hurst is a 75 y.o. female coming in with complaint of L knee pain. Patient states that she has not but any weight on L leg since last visit. Using rolling chair at home. Puts all weight on R leg when she does have to stand. Arms are sore due to pushing up from chair. Using Tylenol for pain. Soreness in knee at times.       Past Medical History:  Diagnosis Date   Arthritis    lower back, right hip   B12 deficiency    Back pain    Borderline diabetes    PCP STARTED PT ON METFORMIN IN 2016 DUE TO ELEVATED GLUCOSE    Complication of anesthesia    FIGHTING WHEN WAKING UP FROM ANESTHESIA   Constipation    Diabetes mellitus without complication (HCC)    Endometriosis    Fatty liver    Gallbladder problem    GERD  (gastroesophageal reflux disease)    Heart murmur    High cholesterol    Hip pain    Joint pain    Kidney stones    Leg weakness    Orthodontics    top front 3 teeth caps and bridge   Prediabetes    RA (rheumatoid arthritis) (HCC)    SOB (shortness of breath)    Stomach ulcer    Swelling    Vitamin D deficiency    Past Surgical History:  Procedure Laterality Date   ABDOMINAL HYSTERECTOMY  age 82   ANKLE SURGERY     CHOLECYSTECTOMY N/A 04/10/2015   Procedure: LAPAROSCOPIC CHOLECYSTECTOMY WITH INTRAOPERATIVE CHOLANGIOGRAM;  Surgeon: Robert Bellow, MD;  Location: [facility] ORS;  Service: General;  Laterality: N/A;   COLONOSCOPY WITH PROPOFOL N/A 07/10/2017   Procedure: COLONOSCOPY WITH PROPOFOL;  Surgeon: Jonathon Bellows, MD;  Location: [facility] ENDOSCOPY;  Service: Gastroenterology;  Laterality: N/A;   ESOPHAGOGASTRODUODENOSCOPY N/A 05/12/2015   Procedure: ESOPHAGOGASTRODUODENOSCOPY (EGD);  Surgeon: Hulen Luster, MD;  Location: [place];  Service: Gastroenterology;  Laterality: N/A;   VAGINAL DELIVERY  2   Social History   Socioeconomic History   Marital status: Widowed    Spouse name: Not on file   Number of children: 2   Years of education: Not on file   Highest education level: Not on file  Occupational History   Occupation: Retired  Tobacco Use   Smoking status: Never   Smokeless tobacco: Never  Vaping Use   Vaping Use: Never used  Substance and Sexual Activity   Alcohol use: No   Drug use: No   Sexual activity: Not Currently  Other Topics Concern   Not on file  Social History Narrative   Lives in [place].       Husband passed away 19-Mar-2014 from PNA.      Work - retired from [place] - regular; working on [employer]   Exercise - walks occasionally, limited by fatigue. 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    Social Determinants of Health   Financial Resource Strain: Medium Risk   Difficulty of Paying Living Expenses: Somewhat  hard  Food Insecurity: Not on file  Transportation Needs: Not on file  Physical Activity: Not on file  Stress: Not on file  Social Connections: Not on file   No Known Allergies Family History  Problem Relation Age of Onset   Diabetes Mother    Cancer Mother        colon and breast   Breast cancer Mother    Liver disease Mother    Cancer Father        Leukemia   Cancer Brother        colon and lung   Colon cancer Brother    Cholelithiasis Daughter    Heart disease Paternal Grandfather    Cancer Other 29       colon   Thyroid cancer Neg Hx     Current Outpatient Medications (Endocrine & Metabolic):    Semaglutide, 1 MG/DOSE, (OZEMPIC, 1 MG/DOSE,) 4 MG/3ML SOPN, Inject 1 mg into the skin once a week.  Current Outpatient Medications (Cardiovascular):    colesevelam (WELCHOL) 625 MG tablet, Take 625 mg by mouth daily as needed.   metolazone (ZAROXOLYN) 5 MG tablet, Take 5 mg by mouth daily.   rosuvastatin (CRESTOR) 5 MG tablet, Take 1 tablet (5 mg total) by mouth daily.   torsemide (DEMADEX) 20 MG tablet, Take 20 mg by mouth daily as needed.   Current Outpatient Medications (Analgesics):    acetaminophen (TYLENOL) 650 MG CR tablet, Take 650 mg by mouth every 8 (eight) hours as needed for pain.   diclofenac (VOLTAREN) 75 MG EC tablet, Take 1 tablet (75 mg total) by mouth 2 (two) times daily.   meloxicam (MOBIC) 7.5 MG tablet, Take 1 tablet (7.5 mg total) by mouth daily as needed for pain.   traMADol (ULTRAM) 50 MG tablet, Take 1 tablet (50 mg total) by mouth daily as needed.  Current Outpatient Medications (Hematological):    cyanocobalamin (,VITAMIN B-12,) 1000 MCG/ML injection, INJECT 1 ML ONCE PER MONTH  Current Outpatient Medications (Other):    buPROPion (WELLBUTRIN XL) 150 MG 24 hr tablet, Take 1 tablet (150 mg total) by mouth daily.   calcium carbonate (OSCAL) 1500 (600 Ca) MG TABS tablet, Take 600 mg by mouth daily.   cholecalciferol (VITAMIN D3) 25 MCG (1000 UNIT)  tablet, Take 2,000 Units by mouth daily.   esomeprazole (NEXIUM) 40 MG capsule, Take 40 mg by mouth daily as needed.   gabapentin (NEURONTIN) 300 MG capsule, TAKE 1 CAPSULE(300 MG) BY MOUTH AT BEDTIME   ondansetron (ZOFRAN-ODT) 4 MG disintegrating tablet, Take 4 mg by mouth as needed.   potassium chloride (KLOR-CON) 10 MEQ tablet, Take 1 tablet by mouth  twice daily. **Take with Metolazone.**   rOPINIRole (REQUIP) 0.25 MG tablet, TAKE 1 TABLET(0.25 MG) BY MOUTH AT BEDTIME AS NEEDED   sennosides-docusate sodium (SENOKOT-S) 8.6-50 MG tablet, Take 1 tablet by mouth daily.   tiZANidine (ZANAFLEX) 2 MG tablet, TAKE 1 TO 2 TABLETS(2 TO 4 MG) BY MOUTH EVERY 8 HOURS AS NEEDED FOR MUSCLE SPASMS   Vitamin E 400 units TABS, Take 400 Units by mouth daily.   Reviewed prior external information including notes and imaging from  primary care provider As well as notes that were available from care everywhere and other healthcare systems.  Past medical history, social, surgical and family history all reviewed in electronic medical record.  No pertanent information unless stated regarding to the chief complaint.   Review of Systems:  No headache, visual changes, nausea, vomiting, diarrhea, constipation, dizziness, abdominal pain, skin rash, fevers, chills, night sweats, weight loss, swollen lymph nodes, body aches, joint swelling, chest pain, shortness of breath, mood changes. POSITIVE muscle aches  Objective  Blood pressure 112/72, pulse 76, height 5' 1"  (1.549 m), SpO2 96 %.   General: No apparent distress alert and oriented x3 mood and affect normal, dressed appropriately.  HEENT: Pupils equal, extraocular movements intact  Respiratory: Patient's speak in full sentences and does not appear short of breath  Cardiovascular: No lower extremity edema, non tender, no erythema  Gait patient is in a wheelchair MSK:   Left knee exam shows the patient does have good range of motion of the knee and no significant  swelling noted at the moment.  Patient still has a positive McMurray's noted.  When asked patient is able to put weight on the leg.  Was able to take 3 steps going forward and backwards with no significant increase in discomfort and no increase in instability.    Impression and Recommendations:     The above documentation has been reviewed and is accurate and complete Lyndal Pulley, DO

## 2020-09-23 ENCOUNTER — Ambulatory Visit: Payer: Medicare Other | Admitting: Family Medicine

## 2020-09-23 ENCOUNTER — Encounter: Payer: Self-pay | Admitting: Family Medicine

## 2020-09-23 ENCOUNTER — Other Ambulatory Visit: Payer: Self-pay

## 2020-09-23 DIAGNOSIS — M25562 Pain in left knee: Secondary | ICD-10-CM | POA: Diagnosis not present

## 2020-09-23 NOTE — Patient Instructions (Signed)
Walk on leg Exercises Ice at end of day Worsening pain call me and we can get MRI See me in 4-6 weeks

## 2020-09-23 NOTE — Assessment & Plan Note (Signed)
Patient has been very cautious with the knee and has not done any weight on it.  We did have patient walk today and did do better.  Patient has had some atrophy of the musculature.  We discussed formal physical therapy which patient declined.  Discussed with her I would like her to be walking and can wear the brace to give her some stability.  If any increasing instability I do feel that an MRI would be necessary.  Patient encouraged to do the home exercises regularly and given her handout.  Follow-up with me again in 4 to 5 weeks

## 2020-10-07 ENCOUNTER — Ambulatory Visit (INDEPENDENT_AMBULATORY_CARE_PROVIDER_SITE_OTHER): Payer: Medicare Other | Admitting: Family Medicine

## 2020-10-07 ENCOUNTER — Other Ambulatory Visit: Payer: Self-pay

## 2020-10-07 ENCOUNTER — Encounter (INDEPENDENT_AMBULATORY_CARE_PROVIDER_SITE_OTHER): Payer: Self-pay | Admitting: Family Medicine

## 2020-10-07 VITALS — BP 115/75 | HR 77 | Temp 98.0°F | Ht 61.0 in | Wt 199.0 lb

## 2020-10-07 DIAGNOSIS — R632 Polyphagia: Secondary | ICD-10-CM | POA: Diagnosis not present

## 2020-10-07 DIAGNOSIS — Z6841 Body Mass Index (BMI) 40.0 and over, adult: Secondary | ICD-10-CM | POA: Diagnosis not present

## 2020-10-07 DIAGNOSIS — M25569 Pain in unspecified knee: Secondary | ICD-10-CM

## 2020-10-07 DIAGNOSIS — E1169 Type 2 diabetes mellitus with other specified complication: Secondary | ICD-10-CM | POA: Diagnosis not present

## 2020-10-07 NOTE — Progress Notes (Signed)
Chief Complaint:   OBESITY Porschea is here to discuss her progress with her obesity treatment plan along with follow-up of her obesity related diagnoses.   Today's visit was #: 19 Starting weight: 213 lbs Starting date: 06/30/2019 Today's weight: 199 lbs Today's date: 10/07/2020 Weight change since last visit: 7 lbs Total lbs lost to date: 14 lbs Body mass index is 37.6 kg/m.  Total weight loss percentage to date: -6.57%  Current Meal Plan: the Very Low Calorie Protein Sparing Modified Fast Plan for 1-2% of the time.  Current Exercise Plan: None. Current Anti-Obesity Medications: Ozempic 1 mg subcutaneously weekly. Side effects: None.  Interim History:  Rolla is doing well.  She says her knee is healing.  She is happy with her weight loss.  Assessment/Plan:   1. Knee pain, acute Followed by Dr. [name].  Shadia says that her knee is healing well.  2. Type 2 diabetes mellitus with other specified complication, without long-term current use of insulin (HCC) Diabetes Mellitus: At goal. Medication: Ozempic 1 mg subcutaneously weekly. Issues reviewed: blood sugar goals, complications of diabetes mellitus, hypoglycemia prevention and treatment, exercise, and nutrition.   Plan: The patient will continue to focus on protein-rich, low simple carbohydrate foods. We reviewed the importance of hydration, regular exercise for stress reduction, and restorative sleep.   Lab Results  Component Value Date   HGBA1C 5.5 07/14/2020   HGBA1C 5.2 09/25/2019   HGBA1C 5.9 05/15/2019   Lab Results  Component Value Date   MICROALBUR 1.7 03/26/2020   LDLCALC 114 (H) 03/26/2020   CREATININE 0.72 07/29/2020   3. Polyphagia Controlled. Current treatment: Ozempic 1 mg subcutaneously weekly. She will continue to focus on protein-rich, low simple carbohydrate foods. We reviewed the importance of hydration, regular exercise for stress reduction, and restorative sleep.  4. Obesity, current BMI  37.6  Course: Neilani is currently in the action stage of change. As such, her goal is to continue with weight loss efforts.   Nutrition goals: She has agreed to following a lower carbohydrate, vegetable and lean protein rich diet plan.   Exercise goals: No exercise has been prescribed at this time.  Behavioral modification strategies: increasing lean protein intake, decreasing simple carbohydrates, increasing vegetables, and increasing water intake.  Maudell has agreed to follow-up with our clinic in 4-6 weeks. She was informed of the importance of frequent follow-up visits to maximize her success with intensive lifestyle modifications for her multiple health conditions.   Objective:   Blood pressure 115/75, pulse 77, temperature 98 F (36.7 C), temperature source Oral, height 5' 1"  (1.549 m), weight 199 lb (90.3 kg), SpO2 95 %. Body mass index is 37.6 kg/m.  General: Cooperative, alert, well developed, in no acute distress. HEENT: Conjunctivae and lids unremarkable. Cardiovascular: Regular rhythm.  Lungs: Normal work of breathing. Neurologic: No focal deficits.   Lab Results  Component Value Date   CREATININE 0.72 07/29/2020   BUN 18 07/29/2020   NA 143 07/29/2020   K 4.0 07/29/2020   CL 107 07/29/2020   CO2 27 07/29/2020   Lab Results  Component Value Date   ALT 13 07/29/2020   AST 17 07/29/2020   ALKPHOS 64 07/29/2020   BILITOT 0.6 07/29/2020   Lab Results  Component Value Date   HGBA1C 5.5 07/14/2020   HGBA1C 5.2 09/25/2019   HGBA1C 5.9 05/15/2019   HGBA1C 6.0 09/04/2018   HGBA1C 5.9 12/25/2017   Lab Results  Component Value Date   INSULIN 20.9 06/20/2019  Lab Results  Component Value Date   TSH 3.060 06/20/2019   Lab Results  Component Value Date   CHOL 182 03/26/2020   HDL 48.60 03/26/2020   LDLCALC 114 (H) 03/26/2020   LDLDIRECT 177.7 12/25/2012   TRIG 94.0 03/26/2020   CHOLHDL 4 03/26/2020   Lab Results  Component Value Date   VD25OH 35.49  09/04/2018   VD25OH 27.53 (L) 04/02/2018   VD25OH 25.42 (L) 12/25/2017   Lab Results  Component Value Date   WBC 7.8 06/20/2019   HGB 14.9 06/20/2019   HCT 44.5 06/20/2019   MCV 89 06/20/2019   PLT 189 06/20/2019   Lab Results  Component Value Date   IRON 59 09/07/2018   FERRITIN 87.1 09/07/2018   Obesity Behavioral Intervention:   Approximately 15 minutes were spent on the discussion below.  ASK: We discussed the diagnosis of obesity with [name] today and Noah agreed to give [place] permission to discuss obesity behavioral modification therapy today.  ASSESS: Ajooni has the diagnosis of obesity and her BMI today is 37.6. Raveena is in the action stage of change.   ADVISE: Bren was educated on the multiple health risks of obesity as well as the benefit of weight loss to improve her health. She was advised of the need for long term treatment and the importance of lifestyle modifications to improve her current health and to decrease her risk of future health problems.  AGREE: Multiple dietary modification options and treatment options were discussed and Ziza agreed to follow the recommendations documented in the above note.  ARRANGE: Santita was educated on the importance of frequent visits to treat obesity as outlined per CMS and USPSTF guidelines and agreed to schedule her next follow up appointment today.  Attestation Statements:   Reviewed by clinician on day of visit: allergies, medications, problem list, medical history, surgical history, family history, social history, and previous encounter notes.  I, Water quality scientist, CMA, am acting as transcriptionist for Briscoe Deutscher, DO  I have reviewed the above documentation for accuracy and completeness, and I agree with the above. Briscoe Deutscher, DO

## 2020-10-22 NOTE — Progress Notes (Signed)
Morgan Hurst [address] [place] Phone: [phone] Subjective:   Morgan Hurst, am serving as a scribe for Dr. Hulan Saas. This visit occurred during the SARS-CoV-2 public health emergency.  Safety protocols were in place, including screening questions prior to the visit, additional usage of staff PPE, and extensive cleaning of exam room while observing appropriate contact time as indicated for disinfecting solutions.   I'm seeing this patient by the request  of:  Burnard Hawthorne, FNP  CC: Right hip pain follow-up  EPP:[id number]  09/23/2020 Patient has been very cautious with the knee and has not done any weight on it.  We did have patient walk today and did do better.  Patient has had some atrophy of the musculature.  We discussed formal physical therapy which patient declined.  Discussed with her I would like her to be walking and can wear the brace to give her some stability.  If any increasing instability I do feel that an MRI would be necessary.  Patient encouraged to do the home exercises regularly and given her handout.  Follow-up with me again in 4 to 5 weeks  Update 10/23/2020 Morgan Hurst is a 75 y.o. female coming in with complaint of L knee pain. Patient states left knee is doing well. Progressively getting better. Right hip hurts more. Achy pain that radiates down to knee sometimes. Not sharp. Feels it most when sitting and walking a distance       Past Medical History:  Diagnosis Date   Arthritis    lower back, right hip   B12 deficiency    Back pain    Borderline diabetes    PCP STARTED PT ON METFORMIN IN 2016 DUE TO ELEVATED GLUCOSE    Complication of anesthesia    FIGHTING WHEN WAKING UP FROM ANESTHESIA   Constipation    Diabetes mellitus without complication (HCC)    Endometriosis    Fatty liver    Gallbladder problem    GERD (gastroesophageal reflux disease)    Heart murmur    High cholesterol     Hip pain    Joint pain    Kidney stones    Leg weakness    Orthodontics    top front 3 teeth caps and bridge   Prediabetes    RA (rheumatoid arthritis) (HCC)    SOB (shortness of breath)    Stomach ulcer    Swelling    Vitamin D deficiency    Past Surgical History:  Procedure Laterality Date   ABDOMINAL HYSTERECTOMY  age 24   ANKLE SURGERY     CHOLECYSTECTOMY N/A 04/10/2015   Procedure: LAPAROSCOPIC CHOLECYSTECTOMY WITH INTRAOPERATIVE CHOLANGIOGRAM;  Surgeon: Robert Bellow, MD;  Location: [facility] ORS;  Service: General;  Laterality: N/A;   COLONOSCOPY WITH PROPOFOL N/A 07/10/2017   Procedure: COLONOSCOPY WITH PROPOFOL;  Surgeon: Jonathon Bellows, MD;  Location: [facility] ENDOSCOPY;  Service: Gastroenterology;  Laterality: N/A;   ESOPHAGOGASTRODUODENOSCOPY N/A 05/12/2015   Procedure: ESOPHAGOGASTRODUODENOSCOPY (EGD);  Surgeon: Hulen Luster, MD;  Location: [place];  Service: Gastroenterology;  Laterality: N/A;   VAGINAL DELIVERY  2   Social History   Socioeconomic History   Marital status: Widowed    Spouse name: Not on file   Number of children: 2   Years of education: Not on file   Highest education level: Not on file  Occupational History   Occupation: Retired  Tobacco Use   Smoking status: Never  Smokeless tobacco: Never  Vaping Use   Vaping Use: Never used  Substance and Sexual Activity   Alcohol use: No   Drug use: No   Sexual activity: Not Currently  Other Topics Concern   Not on file  Social History Narrative   Lives in [place].       Husband passed away 03-20-14 from PNA.      Work - retired from [employer] - regular; working on [employer]   Exercise - walks occasionally, limited by fatigue. [place] and takes care of her 72 year old grandchild.    Social Determinants of Health   Financial Resource Strain: Medium Risk   Difficulty of Paying Living Expenses: Somewhat hard  Food Insecurity: Not on file  Transportation Needs: Not on file   Physical Activity: Not on file  Stress: Not on file  Social Connections: Not on file   No Known Allergies Family History  Problem Relation Age of Onset   Diabetes Mother    Cancer Mother        colon and breast   Breast cancer Mother    Liver disease Mother    Cancer Father        Leukemia   Cancer Brother        colon and lung   Colon cancer Brother    Cholelithiasis Daughter    Heart disease Paternal Grandfather    Cancer Other 29       colon   Thyroid cancer Neg Hx      Current Outpatient Medications (Cardiovascular):    colesevelam (WELCHOL) 625 MG tablet, Take 625 mg by mouth daily as needed.   metolazone (ZAROXOLYN) 5 MG tablet, Take 5 mg by mouth daily.   rosuvastatin (CRESTOR) 5 MG tablet, Take 1 tablet (5 mg total) by mouth daily.   torsemide (DEMADEX) 20 MG tablet, Take 20 mg by mouth daily as needed.   Current Outpatient Medications (Analgesics):    acetaminophen (TYLENOL) 650 MG CR tablet, Take 650 mg by mouth every 8 (eight) hours as needed for pain.   meloxicam (MOBIC) 7.5 MG tablet, Take 1 tablet (7.5 mg total) by mouth daily as needed for pain.   traMADol (ULTRAM) 50 MG tablet, Take 1 tablet (50 mg total) by mouth daily as needed.  Current Outpatient Medications (Hematological):    cyanocobalamin (,VITAMIN B-12,) 1000 MCG/ML injection, INJECT 1 ML ONCE PER MONTH  Current Outpatient Medications (Other):    buPROPion (WELLBUTRIN XL) 150 MG 24 hr tablet, Take 1 tablet (150 mg total) by mouth daily.   calcium carbonate (OSCAL) 1500 (600 Ca) MG TABS tablet, Take 600 mg by mouth daily.   cholecalciferol (VITAMIN D3) 25 MCG (1000 UNIT) tablet, Take 2,000 Units by mouth daily.   esomeprazole (NEXIUM) 40 MG capsule, Take 40 mg by mouth daily as needed.   gabapentin (NEURONTIN) 300 MG capsule, TAKE 1 CAPSULE(300 MG) BY MOUTH AT BEDTIME   ondansetron (ZOFRAN-ODT) 4 MG disintegrating tablet, Take 4 mg by mouth as needed.   potassium chloride (KLOR-CON) 10 MEQ  tablet, Take 1 tablet by mouth twice daily. **Take with Metolazone.**   rOPINIRole (REQUIP) 0.25 MG tablet, TAKE 1 TABLET(0.25 MG) BY MOUTH AT BEDTIME AS NEEDED   sennosides-docusate sodium (SENOKOT-S) 8.6-50 MG tablet, Take 1 tablet by mouth daily.   tiZANidine (ZANAFLEX) 2 MG tablet, TAKE 1 TO 2 TABLETS(2 TO 4 MG) BY MOUTH EVERY 8 HOURS AS NEEDED FOR MUSCLE SPASMS  Vitamin E 400 units TABS, Take 400 Units by mouth daily.   Reviewed prior external information including notes and imaging from  primary care provider As well as notes that were available from care everywhere and other healthcare systems.  Past medical history, social, surgical and family history all reviewed in electronic medical record.  No pertanent information unless stated regarding to the chief complaint.   Review of Systems:  No headache, visual changes, nausea, vomiting, diarrhea, constipation, dizziness, abdominal pain, skin rash, fevers, chills, night sweats, weight loss, swollen lymph nodes,  joint swelling, chest pain, shortness of breath, mood changes. POSITIVE muscle aches, body aches  Objective  Blood pressure 130/80, pulse 78, height 5' 1"  (1.549 m), weight 203 lb (92.1 kg), SpO2 96 %.   General: No apparent distress alert and oriented x3 mood and affect normal, dressed appropriately.  HEENT: Pupils equal, extraocular movements intact  Respiratory: Patient's speak in full sentences and does not appear short of breath  Cardiovascular: No lower extremity edema, non tender, no erythema  Gait normal with good balance and coordination.  MSK: Left knee exam shows the patient does have improvement in range of motion.  He still has some mild crepitus noted. Right hip shows severe tenderness to palpation of the greater trochanteric area.  Tightness with FABER test noted.  Tightness with straight leg test but no radicular symptoms.  Patient does have diffuse back tenderness  After informed written and verbal consent,  patient was seated on exam table. Right knee was prepped with alcohol swab and utilizing anterolateral approach, patient's right knee space was injected with 4:1  marcaine 0.5%: Kenalog 44m/dL. Patient tolerated the procedure well without immediate complications.    Impression and Recommendations:    The above documentation has been reviewed and is accurate and complete ZLyndal Pulley DO

## 2020-10-23 ENCOUNTER — Other Ambulatory Visit: Payer: Self-pay

## 2020-10-23 ENCOUNTER — Ambulatory Visit: Payer: Medicare Other | Admitting: Family Medicine

## 2020-10-23 ENCOUNTER — Encounter: Payer: Self-pay | Admitting: Family Medicine

## 2020-10-23 DIAGNOSIS — M7061 Trochanteric bursitis, right hip: Secondary | ICD-10-CM | POA: Diagnosis not present

## 2020-10-23 NOTE — Assessment & Plan Note (Signed)
Injection given today.  Patient tolerated the procedure well.  Hopefully will make significant improvement.  He does have known spinal stenosis and this could be potential radicular symptoms as well.  We discussed with patient though that likely secondary to #he has been ambulating to favor her left knee this could have caused an exacerbation.  Patient will follow-up with me again in 6 weeks.

## 2020-10-23 NOTE — Patient Instructions (Signed)
Injection today Hopefully just a little overcompensation See you again in 2 months

## 2020-10-30 ENCOUNTER — Ambulatory Visit (INDEPENDENT_AMBULATORY_CARE_PROVIDER_SITE_OTHER): Payer: Medicare Other | Admitting: Family

## 2020-10-30 ENCOUNTER — Telehealth: Payer: Self-pay

## 2020-10-30 ENCOUNTER — Other Ambulatory Visit: Payer: Self-pay

## 2020-10-30 ENCOUNTER — Encounter: Payer: Self-pay | Admitting: Family

## 2020-10-30 VITALS — BP 110/64 | HR 71 | Temp 97.9°F | Ht 61.0 in | Wt 202.6 lb

## 2020-10-30 DIAGNOSIS — K760 Fatty (change of) liver, not elsewhere classified: Secondary | ICD-10-CM

## 2020-10-30 DIAGNOSIS — G8929 Other chronic pain: Secondary | ICD-10-CM

## 2020-10-30 DIAGNOSIS — Z6841 Body Mass Index (BMI) 40.0 and over, adult: Secondary | ICD-10-CM

## 2020-10-30 DIAGNOSIS — E1169 Type 2 diabetes mellitus with other specified complication: Secondary | ICD-10-CM | POA: Diagnosis not present

## 2020-10-30 DIAGNOSIS — L989 Disorder of the skin and subcutaneous tissue, unspecified: Secondary | ICD-10-CM

## 2020-10-30 DIAGNOSIS — Z23 Encounter for immunization: Secondary | ICD-10-CM | POA: Diagnosis not present

## 2020-10-30 DIAGNOSIS — E785 Hyperlipidemia, unspecified: Secondary | ICD-10-CM

## 2020-10-30 DIAGNOSIS — M545 Low back pain, unspecified: Secondary | ICD-10-CM | POA: Diagnosis not present

## 2020-10-30 DIAGNOSIS — E66813 Obesity, class 3: Secondary | ICD-10-CM

## 2020-10-30 LAB — COMPREHENSIVE METABOLIC PANEL
ALT: 11 U/L (ref 0–35)
AST: 14 U/L (ref 0–37)
Albumin: 3.8 g/dL (ref 3.5–5.2)
Alkaline Phosphatase: 74 U/L (ref 39–117)
BUN: 17 mg/dL (ref 6–23)
CO2: 29 mEq/L (ref 19–32)
Calcium: 9.5 mg/dL (ref 8.4–10.5)
Chloride: 105 mEq/L (ref 96–112)
Creatinine, Ser: 0.7 mg/dL (ref 0.40–1.20)
GFR: 85.21 mL/min (ref >60.00)
Glucose, Bld: 89 mg/dL (ref 70–99)
Potassium: 4.2 mEq/L (ref 3.5–5.1)
Sodium: 141 mEq/L (ref 135–145)
Total Bilirubin: 0.4 mg/dL (ref 0.2–1.2)
Total Protein: 6.6 g/dL (ref 6.0–8.3)

## 2020-10-30 LAB — LIPID PANEL
Cholesterol: 174 mg/dL (ref 0–200)
HDL: 45.8 mg/dL (ref >39.00)
LDL Cholesterol: 103 mg/dL — ABNORMAL HIGH (ref 0–99)
NonHDL: 127.79
Total CHOL/HDL Ratio: 4
Triglycerides: 124 mg/dL (ref 0.0–149.0)
VLDL: 24.8 mg/dL (ref 0.0–40.0)

## 2020-10-30 LAB — POCT GLYCOSYLATED HEMOGLOBIN (HGB A1C): Hemoglobin A1C: 5.3 % (ref 4.0–5.6)

## 2020-10-30 MED ORDER — SEMAGLUTIDE (1 MG/DOSE) 4 MG/3ML ~~LOC~~ SOPN: 1.0000 mg | PEN_INJECTOR | SUBCUTANEOUS | 0 refills | Status: DC

## 2020-10-30 NOTE — Telephone Encounter (Signed)
Today I misinformed patient that she was complete in pneumonia vaccines bc health maintenance did not alert me. Apparently this no longer shows up if a patient is due because of the previous glitch in system, so now we all have to look to be sure. Looks like she is due for one more dose of pneumococcal 23? Please advise & I can call patient to offer appointment to get or she can at f/u.

## 2020-10-30 NOTE — Assessment & Plan Note (Signed)
LDL > 100. Goal closer to 70 with h/o DM.advised patient to increase crestor to 81m.

## 2020-10-30 NOTE — Telephone Encounter (Signed)
I spoke with patient to scheduled, but she lost reception. LMTCB  to get her scheduled for nurse visit for pneumococcal 23.

## 2020-10-30 NOTE — Progress Notes (Signed)
Subjective:    Patient ID: Morgan Hurst, female    DOB: 1945/05/24, 75 y.o.   MRN: [id number]  CC: Morgan Hurst is a 75 y.o. female who presents today for follow up.   HPI: Feels well today.  No new complaints.  She has noticed new several lesions on her hands and legs see dermatology for this. Denies symptoms of hypoglycemia including lightheadedness, shakiness, diaphoresis.  She is pleased with Wellbutrin on 150 mg, Ozempic 1 mg and like to continue with medications to help curb appetite.  Hyperlipidemia-compliant with Crestor 5 mg taken daily now to QOD, without myalgias.  Low back pain -chronic, unchanged. No numbness, trouble having a bowel movement or trouble urinating   compliant with tramadol 86m qd prn which is helpful for low back pain.  Seen by Dr. STamala Julian10/07/2020 for right hip pain, given Kenalog injection.  [facility] in [facility] to diabetes, weight loss.  She is compliant with Ozempic 1 mg.  Last seen 09/02/2020  No alcohol use.   NAFLD- following with [facility] 08/2019 showed Ultrasound showed fatty liver but no liver lesions or masses. No evidence of advanced fibrosis (scarring) or cirrhosis.  HISTORY:  Past Medical History:  Diagnosis Date   Arthritis    lower back, right hip   B12 deficiency    Back pain    Borderline diabetes    PCP STARTED PT ON METFORMIN IN 2016 DUE TO ELEVATED GLUCOSE    Complication of anesthesia    FIGHTING WHEN WAKING UP FROM ANESTHESIA   Constipation    Diabetes mellitus without complication (HCC)    Endometriosis    Fatty liver    Gallbladder problem    GERD (gastroesophageal reflux disease)    Heart murmur    High cholesterol    Hip pain    Joint pain    Kidney stones    Leg weakness    Orthodontics    top front 3 teeth caps and bridge   Prediabetes    RA (rheumatoid arthritis) (HCC)    SOB (shortness of breath)    Stomach ulcer    Swelling    Vitamin D  deficiency    Past Surgical History:  Procedure Laterality Date   ABDOMINAL HYSTERECTOMY  age 75  ANKLE SURGERY     CHOLECYSTECTOMY N/A 04/10/2015   Procedure: LAPAROSCOPIC CHOLECYSTECTOMY WITH INTRAOPERATIVE CHOLANGIOGRAM;  Surgeon: JRobert Bellow MD;  Location: [facility] ORS;  Service: General;  Laterality: N/A;   COLONOSCOPY WITH PROPOFOL N/A 07/10/2017   Procedure: COLONOSCOPY WITH PROPOFOL;  Surgeon: AJonathon Bellows MD;  [facility];  Service: Gastroenterology;  Laterality: N/A;   ESOPHAGOGASTRODUODENOSCOPY N/A 05/12/2015   Procedure: ESOPHAGOGASTRODUODENOSCOPY (EGD);  Surgeon: PHulen Luster MD;  [place]  Service: Gastroenterology;  Laterality: N/A;   VAGINAL DELIVERY  2   Family History  Problem Relation Age of Onset   Diabetes Mother    Cancer Mother        colon and breast   Breast cancer Mother    Liver disease Mother    Cancer Father        Leukemia   Cancer Brother        colon and lung   Colon cancer Brother    Cholelithiasis Daughter    Heart disease Paternal Grandfather    Cancer Other 29       colon   Thyroid cancer Neg Hx  Allergies: Patient has no known allergies. Current Outpatient Medications on File Prior to Visit  Medication Sig Dispense Refill   acetaminophen (TYLENOL) 650 MG CR tablet Take 650 mg by mouth every 8 (eight) hours as needed for pain.     buPROPion (WELLBUTRIN XL) 150 MG 24 hr tablet Take 1 tablet (150 mg total) by mouth daily. 30 tablet 1   calcium carbonate (OSCAL) 1500 (600 Ca) MG TABS tablet Take 600 mg by mouth daily.     cholecalciferol (VITAMIN D3) 25 MCG (1000 UNIT) tablet Take 2,000 Units by mouth daily.     colesevelam (WELCHOL) 625 MG tablet Take 625 mg by mouth daily as needed.     cyanocobalamin (,VITAMIN B-12,) 1000 MCG/ML injection INJECT 1 ML ONCE PER MONTH 1 mL 15   esomeprazole (NEXIUM) 40 MG capsule Take 40 mg by mouth daily as needed.     gabapentin (NEURONTIN) 300 MG capsule TAKE 1  CAPSULE(300 MG) BY MOUTH AT BEDTIME 30 capsule 0   metolazone (ZAROXOLYN) 5 MG tablet Take 5 mg by mouth daily.     ondansetron (ZOFRAN-ODT) 4 MG disintegrating tablet Take 4 mg by mouth as needed.     potassium chloride (KLOR-CON) 10 MEQ tablet Take 1 tablet by mouth twice daily. **Take with Metolazone.** 180 tablet 0   rOPINIRole (REQUIP) 0.25 MG tablet TAKE 1 TABLET(0.25 MG) BY MOUTH AT BEDTIME AS NEEDED 90 tablet 0   rosuvastatin (CRESTOR) 5 MG tablet Take 1 tablet (5 mg total) by mouth daily. 90 tablet 1   sennosides-docusate sodium (SENOKOT-S) 8.6-50 MG tablet Take 1 tablet by mouth daily.     tiZANidine (ZANAFLEX) 2 MG tablet TAKE 1 TO 2 TABLETS(2 TO 4 MG) BY MOUTH EVERY 8 HOURS AS NEEDED FOR MUSCLE SPASMS 60 tablet 1   torsemide (DEMADEX) 20 MG tablet Take 20 mg by mouth daily as needed.     traMADol (ULTRAM) 50 MG tablet Take 1 tablet (50 mg total) by mouth daily as needed. 30 tablet 1   Vitamin E 400 units TABS Take 400 Units by mouth daily.     No current facility-administered medications on file prior to visit.    Social History   Tobacco Use   Smoking status: Never   Smokeless tobacco: Never  Vaping Use   Vaping Use: Never used  Substance Use Topics   Alcohol use: No   Drug use: No    Review of Systems  Constitutional:  Negative for chills and fever.  Respiratory:  Negative for cough.   Cardiovascular:  Negative for chest pain and palpitations.  Gastrointestinal:  Negative for nausea and vomiting.     Objective:    BP 110/64 (BP Location: Left Arm, Patient Position: Sitting, Cuff Size: Large)   Pulse 71   Temp 97.9 F (36.6 C) (Oral)   Ht 5' 1"  (1.549 m)   Wt 202 lb 9.6 oz (91.9 kg)   SpO2 98%   BMI 38.28 kg/m  BP Readings from Last 3 Encounters:  10/30/20 110/64  10/23/20 130/80  10/07/20 115/75   Wt Readings from Last 3 Encounters:  10/30/20 202 lb 9.6 oz (91.9 kg)  10/23/20 203 lb (92.1 kg)  10/07/20 199 lb (90.3 kg)    Physical Exam Vitals  reviewed.  Constitutional:      Appearance: She is well-developed.  Eyes:     Conjunctiva/sclera: Conjunctivae normal.  Cardiovascular:     Rate and Rhythm: Normal rate and regular rhythm.     Pulses: Normal  pulses.     Heart sounds: Normal heart sounds.  Pulmonary:     Effort: Pulmonary effort is normal.     Breath sounds: Normal breath sounds. No wheezing, rhonchi or rales.  Skin:    General: Skin is warm and dry.  Neurological:     Mental Status: She is alert.  Psychiatric:        Speech: Speech normal.        Behavior: Behavior normal.        Thought Content: Thought content normal.       Assessment & Plan:   Problem List Items Addressed This Visit       Digestive   Fatty liver disease, nonalcoholic    Discussed the importance of continued surveillance for disease progression with [facility] gastroenterology.  She will call to schedule follow-up        Musculoskeletal and Integument   Skin lesion   Relevant Orders   Ambulatory referral to Dermatology     Other   Chronic midline low back pain without sciatica    Chronic, stable.  Continue tramadol 50 mg as needed.  She is using this very sparingly.      Hyperlipidemia    LDL > 100. Goal closer to 70 with h/o DM.advised patient to increase crestor to 56m.       Obesity    Lab Results  Component Value Date   HGBA1C 5.3 10/30/2020  She is not diabetic.  Denies hypoglycemic episodes.  We will continue Wellbutrin 100 mg, Ozempic 1 mg for appetite suppression, goal of weight loss      Relevant Medications   Semaglutide, 1 MG/DOSE, 4 MG/3ML SOPN   Other Visit Diagnoses     Type 2 diabetes mellitus with other specified complication, without long-term current use of insulin (HCC)    -  Primary   Relevant Medications   Semaglutide, 1 MG/DOSE, 4 MG/3ML SOPN   Other Relevant Orders   POCT HgB A1C (Completed)   Comprehensive metabolic panel (Completed)   Lipid panel (Completed)   Need for immunization against  influenza       Relevant Orders   Flu Vaccine QUAD High Dose(Fluad) (Completed)        I have discontinued Maday M. Ebner's meloxicam. I am also having her start on Semaglutide (1 MG/DOSE). Additionally, I am having her maintain her esomeprazole, colesevelam, cholecalciferol, calcium carbonate, acetaminophen, potassium chloride, torsemide, ondansetron, cyanocobalamin, rOPINIRole, metolazone, Vitamin E, sennosides-docusate sodium, tiZANidine, gabapentin, buPROPion, rosuvastatin, and traMADol.   Meds ordered this encounter  Medications   Semaglutide, 1 MG/DOSE, 4 MG/3ML SOPN    Sig: Inject 1 mg as directed once a week.    Dispense:  3 mL    Refill:  0    Return precautions given.   Risks, benefits, and alternatives of the medications and treatment plan prescribed today were discussed, and patient expressed understanding.   Education regarding symptom management and diagnosis given to patient on AVS.  Continue to follow with ABurnard Hawthorne FNP for routine health maintenance.   BTheodoro Doingand I agreed with plan.   MMable Paris FNP

## 2020-10-30 NOTE — Telephone Encounter (Signed)
  As discussed She had pneumococcal 23 in 12/9 2013 And prevnar 13 03/18/2014  Agree that she needs pneumococcal 23 Please sch

## 2020-10-30 NOTE — Patient Instructions (Addendum)
Referral dermatology for annual skin exam   Let us know if you dont hear back within a week in regards to an appointment being scheduled.   call to schedule follow up, surveillance for fatty liver disease with [name] as we discussed  Gerrit Heck, [address]   [place], [place] [address]   [phone] (Work)    Nice to see you!

## 2020-10-30 NOTE — Telephone Encounter (Signed)
Patient is scheduled for a  pneumococcal 23 shot on 11/3.

## 2020-10-30 NOTE — Assessment & Plan Note (Addendum)
Lab Results  Component Value Date   HGBA1C 5.3 10/30/2020   She is not diabetic.  Denies hypoglycemic episodes.  We will continue Wellbutrin 100 mg, Ozempic 1 mg for appetite suppression, goal of weight loss

## 2020-10-30 NOTE — Assessment & Plan Note (Signed)
Discussed the importance of continued surveillance for disease progression with [facility] gastroenterology.  She will call to schedule follow-up

## 2020-10-30 NOTE — Assessment & Plan Note (Signed)
Chronic, stable.  Continue tramadol 50 mg as needed.  She is using this very sparingly.

## 2020-11-03 ENCOUNTER — Ambulatory Visit (INDEPENDENT_AMBULATORY_CARE_PROVIDER_SITE_OTHER): Payer: Medicare Other | Admitting: Physician Assistant

## 2020-11-03 ENCOUNTER — Telehealth: Payer: Self-pay | Admitting: *Deleted

## 2020-11-03 NOTE — Telephone Encounter (Signed)
Left message to call office

## 2020-11-03 NOTE — Telephone Encounter (Signed)
-----   Message from Burnard Hawthorne, [place] sent at 10/30/2020  4:02 PM EDT ----- CALL-  If okay with increasing Crestor, please send in Crestor 10 mg p.o. nightly.  Please order and schedule CMP in 6 weeks  Let me know if cannot reach patient.

## 2020-11-05 ENCOUNTER — Other Ambulatory Visit: Payer: Self-pay

## 2020-11-05 DIAGNOSIS — E785 Hyperlipidemia, unspecified: Secondary | ICD-10-CM

## 2020-11-05 MED ORDER — ROSUVASTATIN CALCIUM 10 MG PO TABS: 10.0000 mg | ORAL_TABLET | Freq: Every day | ORAL | 3 refills | Status: DC

## 2020-11-13 ENCOUNTER — Ambulatory Visit: Payer: Medicare Other | Admitting: Family

## 2020-11-16 ENCOUNTER — Encounter (INDEPENDENT_AMBULATORY_CARE_PROVIDER_SITE_OTHER): Payer: Self-pay | Admitting: Family Medicine

## 2020-11-16 ENCOUNTER — Ambulatory Visit (INDEPENDENT_AMBULATORY_CARE_PROVIDER_SITE_OTHER): Payer: Medicare Other | Admitting: Family Medicine

## 2020-11-16 ENCOUNTER — Other Ambulatory Visit: Payer: Self-pay

## 2020-11-16 VITALS — BP 114/75 | HR 78 | Temp 98.1°F | Ht 61.0 in | Wt 195.0 lb

## 2020-11-16 DIAGNOSIS — F3289 Other specified depressive episodes: Secondary | ICD-10-CM | POA: Diagnosis not present

## 2020-11-16 DIAGNOSIS — E1169 Type 2 diabetes mellitus with other specified complication: Secondary | ICD-10-CM

## 2020-11-16 DIAGNOSIS — Z6841 Body Mass Index (BMI) 40.0 and over, adult: Secondary | ICD-10-CM | POA: Diagnosis not present

## 2020-11-16 MED ORDER — BUPROPION HCL ER (XL) 150 MG PO TB24: 150.0000 mg | ORAL_TABLET | Freq: Every day | ORAL | 0 refills | Status: DC

## 2020-11-16 NOTE — Progress Notes (Signed)
Chief Complaint:   OBESITY Morgan Hurst is here to discuss her progress with her obesity treatment plan along with follow-up of her obesity related diagnoses. Morgan Hurst is on following a lower carbohydrate, vegetable and lean protein rich diet plan and states she is following her eating plan approximately 50-60% of the time. Morgan Hurst states she is walking 10 minutes 3 times per week.  Today's visit was #: 14 Starting weight: 213 lbs Starting date: 06/30/2019 Today's weight: 195 lbs Today's date: 11/16/2020 Total lbs lost to date: 18 Total lbs lost since last in-office visit: 4  Interim History: Morgan Hurst recently went to the beach and is surprised she lost weight. She did more walking than usual. She reports no hunger or cravings at all. She is a pt of Dr. [name]. This her first OV with me.  Subjective:   1. Emotional eating tendencies On 07/23/2020, Dr. [name] increased Wellbutrin from 100 mg to 150 mg. Morgan Hurst is tolerating increase well with no side effects. She is eating less sweets and has decreased sweets cravings. Pt has no concerns.  2. Type 2 diabetes mellitus with other specified complication, without long-term current use of insulin (HCC) Pt is tolerating Ozempic well and is managed by PCP. She does not check her blood sugar. Pt has no concerns.  Assessment/Plan:  No orders of the defined types were placed in this encounter.   Medications Discontinued During This Encounter  Medication Reason   buPROPion (WELLBUTRIN XL) 150 MG 24 hr tablet Reorder     Meds ordered this encounter  Medications   buPROPion (WELLBUTRIN XL) 150 MG 24 hr tablet    Sig: Take 1 tablet (150 mg total) by mouth daily.    Dispense:  30 tablet    Refill:  0     1. Emotional eating tendencies Behavior modification techniques were discussed today to help Inis deal with her emotional/non-hunger eating behaviors.  Orders and follow up as documented in patient record.   Refill- buPROPion (WELLBUTRIN XL) 150 MG  24 hr tablet; Take 1 tablet (150 mg total) by mouth daily.  Dispense: 30 tablet; Refill: 0  2. Type 2 diabetes mellitus with other specified complication, without long-term current use of insulin ([place]) Continue Ozempic per PCP. Continue to increase activity and follow prudent nutritional plan and weight loss. Good blood sugar control is important to decrease the likelihood of diabetic complications such as nephropathy, neuropathy, limb loss, blindness, coronary artery disease, and death. Intensive lifestyle modification including diet, exercise and weight loss are the first line of treatment for diabetes.   3. Class 3 severe obesity with serious comorbidity and body mass index (BMI) of 40.0 to 44.9 in adult, unspecified obesity type (HCC)  Morgan Hurst is currently in the action stage of change. As such, her goal is to continue with weight loss efforts. She has agreed to following a lower carbohydrate, vegetable and lean protein rich diet plan.   Exercise goals:  As is  Behavioral modification strategies: keeping healthy foods in the home and planning for success.  Morgan Hurst has agreed to follow-up with our clinic in 3-4 weeks with Dr. [name]. She was informed of the importance of frequent follow-up visits to maximize her success with intensive lifestyle modifications for her multiple health conditions.   Objective:   Blood pressure 114/75, pulse 78, temperature 98.1 F (36.7 C), height 5' 1"  (1.549 m), weight 195 lb (88.5 kg), SpO2 98 %. Body mass index is 36.84 kg/m.  General: Cooperative, alert, well developed, in  no acute distress. HEENT: Conjunctivae and lids unremarkable. Cardiovascular: Regular rhythm.  Lungs: Normal work of breathing. Neurologic: No focal deficits.   Lab Results  Component Value Date   CREATININE 0.70 10/30/2020   BUN 17 10/30/2020   NA 141 10/30/2020   K 4.2 10/30/2020   CL 105 10/30/2020   CO2 29 10/30/2020   Lab Results  Component Value Date   ALT 11  10/30/2020   AST 14 10/30/2020   ALKPHOS 74 10/30/2020   BILITOT 0.4 10/30/2020   Lab Results  Component Value Date   HGBA1C 5.3 10/30/2020   HGBA1C 5.5 07/14/2020   HGBA1C 5.2 09/25/2019   HGBA1C 5.9 05/15/2019   HGBA1C 6.0 09/04/2018   Lab Results  Component Value Date   INSULIN 20.9 06/20/2019   Lab Results  Component Value Date   TSH 3.060 06/20/2019   Lab Results  Component Value Date   CHOL 174 10/30/2020   HDL 45.80 10/30/2020   LDLCALC 103 (H) 10/30/2020   LDLDIRECT 177.7 12/25/2012   TRIG 124.0 10/30/2020   CHOLHDL 4 10/30/2020   Lab Results  Component Value Date   VD25OH 35.49 09/04/2018   VD25OH 27.53 (L) 04/02/2018   VD25OH 25.42 (L) 12/25/2017   Lab Results  Component Value Date   WBC 7.8 06/20/2019   HGB 14.9 06/20/2019   HCT 44.5 06/20/2019   MCV 89 06/20/2019   PLT 189 06/20/2019   Lab Results  Component Value Date   IRON 59 09/07/2018   FERRITIN 87.1 09/07/2018    Obesity Behavioral Intervention:   Approximately 15 minutes were spent on the discussion below.  ASK: We discussed the diagnosis of obesity with Morgan Hurst today and Morgan Hurst agreed to give [place] permission to discuss obesity behavioral modification therapy today.  ASSESS: Morgan Hurst has the diagnosis of obesity and her BMI today is 36.9. Morgan Hurst is in the action stage of change.   ADVISE: Morgan Hurst was educated on the multiple health risks of obesity as well as the benefit of weight loss to improve her health. She was advised of the need for long term treatment and the importance of lifestyle modifications to improve her current health and to decrease her risk of future health problems.  AGREE: Multiple dietary modification options and treatment options were discussed and Morgan Hurst agreed to follow the recommendations documented in the above note.  ARRANGE: Morgan Hurst was educated on the importance of frequent visits to treat obesity as outlined per CMS and USPSTF guidelines and agreed to schedule her  next follow up appointment today.  Attestation Statements:   Reviewed by clinician on day of visit: allergies, medications, problem list, medical history, surgical history, family history, social history, and previous encounter notes.  Coral Ceo, CMA, am acting as transcriptionist for [employer], DO.  I have reviewed the above documentation for accuracy and completeness, and I agree with the above. Marjory Sneddon, D.O.  The [place] was signed into law in 2016 which includes the topic of electronic health records.  This provides immediate access to information in MyChart.  This includes consultation notes, operative notes, office notes, lab results and pathology reports.  If you have any questions about what you read please let us know at your next visit so we can discuss your concerns and take corrective action if need be.  We are right here with you.

## 2020-11-19 ENCOUNTER — Ambulatory Visit (INDEPENDENT_AMBULATORY_CARE_PROVIDER_SITE_OTHER): Payer: Medicare Other

## 2020-11-19 ENCOUNTER — Ambulatory Visit: Payer: Medicare Other

## 2020-11-19 VITALS — Ht 61.0 in | Wt 195.0 lb

## 2020-11-19 DIAGNOSIS — Z0000 Encounter for general adult medical examination without abnormal findings: Secondary | ICD-10-CM | POA: Diagnosis not present

## 2020-11-19 NOTE — Progress Notes (Signed)
Subjective:   Morgan Hurst is a 75 y.o. female who presents for Medicare Annual (Subsequent) preventive examination.  Review of Systems    No ROS       Objective:    Today's Vitals   11/19/20 0948  Weight: 195 lb (88.5 kg)  Height: 5' 1"  (1.549 m)   Body mass index is 36.84 kg/m.  Advanced Directives 11/19/2020 08/27/2019 08/24/2018 08/22/2017 07/10/2017 08/11/2016 08/12/2015  Does Patient Have a Medical Advance Directive? Yes Yes Yes Yes Yes Yes Yes  Type of Paramedic of [place];Living will Living will [place];Living will [place];Living will - [place];Living will -  Does patient want to make changes to medical advance directive? No - Patient declined No - Patient declined No - Patient declined No - Patient declined - No - Patient declined -  Copy of [place] in Chart? No - copy requested - No - copy requested No - copy requested - No - copy requested No - copy requested    Current Medications (verified) Outpatient Encounter Medications as of 11/19/2020  Medication Sig   acetaminophen (TYLENOL) 650 MG CR tablet Take 650 mg by mouth every 8 (eight) hours as needed for pain.   buPROPion (WELLBUTRIN XL) 150 MG 24 hr tablet Take 1 tablet (150 mg total) by mouth daily.   calcium carbonate (OSCAL) 1500 (600 Ca) MG TABS tablet Take 600 mg by mouth daily.   cholecalciferol (VITAMIN D3) 25 MCG (1000 UNIT) tablet Take 2,000 Units by mouth daily.   colesevelam (WELCHOL) 625 MG tablet Take 625 mg by mouth daily as needed.   cyanocobalamin (,VITAMIN B-12,) 1000 MCG/ML injection INJECT 1 ML ONCE PER MONTH   esomeprazole (NEXIUM) 40 MG capsule Take 40 mg by mouth daily as needed.   gabapentin (NEURONTIN) 300 MG capsule TAKE 1 CAPSULE(300 MG) BY MOUTH AT BEDTIME   metolazone (ZAROXOLYN) 5 MG tablet Take 5 mg by mouth daily.   ondansetron (ZOFRAN-ODT) 4 MG disintegrating tablet Take 4 mg by  mouth as needed.   potassium chloride (KLOR-CON) 10 MEQ tablet Take 1 tablet by mouth twice daily. **Take with Metolazone.**   rOPINIRole (REQUIP) 0.25 MG tablet TAKE 1 TABLET(0.25 MG) BY MOUTH AT BEDTIME AS NEEDED   rosuvastatin (CRESTOR) 10 MG tablet Take 1 tablet (10 mg total) by mouth daily.   Semaglutide, 1 MG/DOSE, 4 MG/3ML SOPN Inject 1 mg as directed once a week.   sennosides-docusate sodium (SENOKOT-S) 8.6-50 MG tablet Take 1 tablet by mouth daily.   tiZANidine (ZANAFLEX) 2 MG tablet TAKE 1 TO 2 TABLETS(2 TO 4 MG) BY MOUTH EVERY 8 HOURS AS NEEDED FOR MUSCLE SPASMS   torsemide (DEMADEX) 20 MG tablet Take 20 mg by mouth daily as needed.   traMADol (ULTRAM) 50 MG tablet Take 1 tablet (50 mg total) by mouth daily as needed.   Vitamin E 400 units TABS Take 400 Units by mouth daily.   No facility-administered encounter medications on file as of 11/19/2020.    Allergies (verified) Patient has no known allergies.   History: Past Medical History:  Diagnosis Date   Arthritis    lower back, right hip   B12 deficiency    Back pain    Borderline diabetes    PCP STARTED PT ON METFORMIN IN 2016 DUE TO ELEVATED GLUCOSE    Complication of anesthesia    FIGHTING WHEN WAKING UP FROM ANESTHESIA   Constipation  Diabetes mellitus without complication (HCC)    Endometriosis    Fatty liver    Gallbladder problem    GERD (gastroesophageal reflux disease)    Heart murmur    High cholesterol    Hip pain    Joint pain    Kidney stones    Leg weakness    Orthodontics    top front 3 teeth caps and bridge   Prediabetes    RA (rheumatoid arthritis) (HCC)    SOB (shortness of breath)    Stomach ulcer    Swelling    Vitamin D deficiency    Past Surgical History:  Procedure Laterality Date   ABDOMINAL HYSTERECTOMY  age 69   ANKLE SURGERY     CHOLECYSTECTOMY N/A 04/10/2015   Procedure: LAPAROSCOPIC CHOLECYSTECTOMY WITH INTRAOPERATIVE CHOLANGIOGRAM;  Surgeon: Robert Bellow, MD;   Location: [facility] ORS;  Service: General;  Laterality: N/A;   COLONOSCOPY WITH PROPOFOL N/A 07/10/2017   Procedure: COLONOSCOPY WITH PROPOFOL;  Surgeon: Jonathon Bellows, MD;  Location: [facility] ENDOSCOPY;  Service: Gastroenterology;  Laterality: N/A;   ESOPHAGOGASTRODUODENOSCOPY N/A 05/12/2015   Procedure: ESOPHAGOGASTRODUODENOSCOPY (EGD);  Surgeon: Hulen Luster, MD;  Location: [facility];  Service: Gastroenterology;  Laterality: N/A;   VAGINAL DELIVERY  2   Family History  Problem Relation Age of Onset   Diabetes Mother    Cancer Mother        colon and breast   Breast cancer Mother    Liver disease Mother    Cancer Father        Leukemia   Cancer Brother        colon and lung   Colon cancer Brother    Cholelithiasis Daughter    Heart disease Paternal Grandfather    Cancer Other 29       colon   Thyroid cancer Neg Hx    Social History   Socioeconomic History   Marital status: Widowed    Spouse name: Not on file   Number of children: 2   Years of education: Not on file   Highest education level: Not on file  Occupational History   Occupation: Retired  Tobacco Use   Smoking status: Never   Smokeless tobacco: Never  Vaping Use   Vaping Use: Never used  Substance and Sexual Activity   Alcohol use: No   Drug use: No   Sexual activity: Not Currently  Other Topics Concern   Not on file  Social History Narrative   Lives in [place].       Husband passed away 2014-03-22 from PNA.      Work - retired from [employer] - regular; working on [employer]   Exercise - walks occasionally, limited by fatigue. 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    Social Determinants of Health   Financial Resource Strain: Low Risk    Difficulty of Paying Living Expenses: Not hard at all  Food Insecurity: No Food Insecurity   Worried About Charity fundraiser in the Last Year: Never true   [place] in the Last Year: Never true  Transportation Needs: No  Transportation Needs   Lack of Transportation (Medical): No   Lack of Transportation (Non-Medical): No  Physical Activity: Insufficiently Active   Days of Exercise per Week: 5 days   Minutes of Exercise per Session: 20 min  Stress: No Stress Concern Present   Feeling of Stress :  Not at all  Social Connections: Moderately Integrated   Frequency of Communication with Friends and Family: More than three times a week   Frequency of Social Gatherings with Friends and Family: More than three times a week   Attends Religious Services: More than 4 times per year   Active Member of [employer] or Organizations: Yes   Attends Archivist Meetings: More than 4 times per year   Marital Status: Widowed    Clinical Intake:  Pre-visit preparation completed: Yes   Diabetes: No  How often do you need to have someone help you when you read instructions, pamphlets, or other written materials from your doctor or pharmacy?: 1 - Never  Interpreter Needed?: No   Activities of Daily Living In your present state of health, do you have any difficulty performing the following activities: 11/19/2020  Hearing? N  Vision? N  Comment Wears reading glasses  Difficulty concentrating or making decisions? N  Walking or climbing stairs? N  Dressing or bathing? N  Doing errands, shopping? N  Preparing Food and eating ? N  Using the Toilet? N  In the past six months, have you accidently leaked urine? N  Do you have problems with loss of bowel control? N  Managing your Medications? N  Managing your Finances? N  Housekeeping or managing your Housekeeping? N  Some recent data might be hidden    Patient Care Team: Burnard Hawthorne, FNP as PCP - General (Family Medicine) Jackolyn Confer, MD (Internal Medicine) Bary Castilla Forest Gleason, MD (General Surgery) De Hollingshead, RPH-CPP (Pharmacist)  Indicate any recent Medical Services you may have received from other than [facility] providers in the past year  (date may be approximate).     Assessment:   This is a routine wellness examination for [place]. Virtual Visit via Telephone Note  I connected with  Morgan Hurst on 11/19/20 at  9:45 AM EDT by telephone and verified that I am speaking with the correct person using two identifiers.  Location: Patient: Home Provider: Office Persons participating in the virtual visit: patient/Nurse Health Advisor   I discussed the limitations, risks, security and privacy concerns of performing an evaluation and management service by telephone and the availability of in person appointments. The patient expressed understanding and agreed to proceed.  Interactive audio and video telecommunications were attempted between this nurse and patient, however failed, due to patient having technical difficulties OR patient did not have access to video capability.  We continued and completed visit with audio only.  Some vital signs may be absent or patient reported.   Criselda Peaches, LPN   Hearing/Vision screen Hearing Screening - Comments:: No Hearing issues. Vision Screening - Comments:: Wears reading glasses  Dietary issues and exercise activities discussed:   Diet-Regular  Current Exercise Habits: Home exercise routine, Type of exercise: walking, Time (Minutes): 15, Frequency (Times/Week): 3, Weekly Exercise (Minutes/Week): 45, Intensity: Moderate   Goals Addressed             This Visit's Progress    Weight goal 180-200lb       Continue making healthy meal choices.   Stay hydrated and drink plenty of water. Stay active.  Stretch.  Daily chair exercises when unable to walk.        Depression Screen PHQ 2/9 Scores 11/19/2020 10/30/2020 08/27/2019 06/20/2019 05/15/2019 03/13/2019 08/24/2018  PHQ - 2 Score 0 0 0 2 0 0 0  PHQ- 9 Score - - - 3 1 0 -  Exception Documentation - - - Medical reason - - -    Fall Risk Fall Risk  11/19/2020 10/30/2020 08/27/2019 03/13/2019 08/24/2018  Falls in the past year? 0  0 0 0 0  Number falls in past yr: 0 - - 0 -  Injury with Fall? 0 - - 0 -  Follow up - Falls evaluation completed Falls evaluation completed Falls evaluation completed -    FALL RISK PREVENTION PERTAINING TO THE HOME:  Any stairs in or around the home? Yes  If so, are there any without handrails? No Home free of loose throw rugs in walkways, pet beds, electrical cords, etc? Yes Adequate lighting in your home to reduce risk of falls? Yes   ASSISTIVE DEVICES UTILIZED TO PREVENT FALLS:  Life alert? No  Use of a cane, walker or w/c? No  Grab bars in the bathroom? No  Shower chair or bench in shower? Yes  Elevated toilet seat or a handicapped toilet? No   TIMED UP AND GO:  Was the test performed? No . Audio Visit  Cognitive Function: MMSE - Mini Mental State Exam 08/22/2017 08/11/2016 08/12/2015  Orientation to time 5 5 5   Orientation to Place 5 5 5   Registration 3 3 3   Attention/ Calculation 5 5 5   Recall 3 3 3   Language- name 2 objects 2 2 2   Language- repeat 1 1 1   Language- follow 3 step command 3 3 3   Language- read & follow direction 1 1 1   Write a sentence 1 1 1   Copy design 1 1 1   Total score 30 30 30      6CIT Screen 11/19/2020 08/27/2019 08/24/2018  What Year? 0 points 0 points 0 points  What month? 0 points 0 points 0 points  What time? 0 points - 0 points  Count back from 20 0 points - 0 points  Months in reverse 0 points 0 points 0 points  Repeat phrase 0 points - 0 points  Total Score 0 - 0    Immunizations Immunization History  Administered Date(s) Administered   Fluad Quad(high Dose 65+) 01/01/2020, 10/30/2020   Hepb-cpg 09/05/2019, 09/25/2019   Influenza Split 12/26/2011   Influenza, High Dose Seasonal PF 11/25/2015, 10/13/2016, 09/26/2017, 11/30/2018   Influenza,inj,Quad PF,6+ Mos 12/25/2012, 03/18/2014, 10/07/2014   Pneumococcal Conjugate-13 03/18/2014   Pneumococcal Polysaccharide-23 12/26/2011   Tdap 12/25/2009   Zoster, Live 12/26/2010    TDAP  status: Due, Education has been provided regarding the importance of this vaccine. Advised may receive this vaccine at local pharmacy or Health Dept. Aware to provide a copy of the vaccination record if obtained from local pharmacy or Health Dept. Verbalized acceptance and understanding.   Qualifies for Shingles Vaccine? Yes   Zostavax completed No   Shingrix Completed?: No.    Education has been provided regarding the importance of this vaccine. Patient has been advised to call insurance company to determine out of pocket expense if they have not yet received this vaccine. Advised may also receive vaccine at local pharmacy or Health Dept. Verbalized acceptance and understanding.  Screening Tests Health Maintenance  Topic Date Due   MAMMOGRAM  02/12/2020   Zoster Vaccines- Shingrix (1 of 2) 02/19/2021 (Originally 06/05/1964)   TETANUS/TDAP  11/19/2021 (Originally 12/26/2019)   URINE MICROALBUMIN  03/26/2021   COLONOSCOPY (Pts 45-71yr Insurance coverage will need to be confirmed)  07/11/2022   Pneumonia Vaccine 75 Years old  Completed   INFLUENZA VACCINE  Completed   DEXA SCAN  Completed  Hepatitis C Screening  Completed   HPV VACCINES  Aged Out   COVID-19 Vaccine  Discontinued    Health Maintenance  Health Maintenance Due  Topic Date Due   MAMMOGRAM  02/12/2020    Vision Screening: Recommended annual ophthalmology exams for early detection of glaucoma and other disorders of the eye. Is the patient up to date with their annual eye exam?  Yes   Who is the provider or what is the name of the office in which the patient attends annual eye exams? Followed by [facility].  Dental Screening: Recommended annual dental exams for proper oral hygiene  Community Resource Referral / Chronic Care Management: CRR required this visit?  No   CCM required this visit?  No      Plan:     I have personally reviewed and noted the following in the patient's chart:   Medical and  social history Use of alcohol, tobacco or illicit drugs. Currently taking opioids. Followed by PCP. Current medications and supplements including opioid prescriptions.  Functional ability and status Nutritional status Physical activity Advanced directives List of other physicians Hospitalizations, surgeries, and ER visits in previous 12 months Vitals Screenings to include cognitive, depression, and falls Referrals and appointments  In addition, I have reviewed and discussed with patient certain preventive protocols, quality metrics, and best practice recommendations. A written personalized care plan for preventive services as well as general preventive health recommendations were provided to patient.    Criselda Peaches, LPN   [id number]

## 2020-11-19 NOTE — Patient Instructions (Addendum)
Morgan Hurst , Thank you for taking time to come for your Medicare Wellness Visit. I appreciate your ongoing commitment to your health goals. Please review the following plan we discussed and let me know if I can assist you in the future.   These are the goals we discussed:  Goals       Medication Monitoring (pt-stated)      Patient Goals/Self-Care Activities Over the next 90 days, patient will:  - collaborate with provider on medication access solutions target a minimum of 150 minutes of moderate intensity exercise weekly engage in dietary modifications by continuing to reduce saturated fats, reducing carbohydrate portions.        Weight goal 180-200lb      Continue making healthy meal choices.   Stay hydrated and drink plenty of water. Stay active.  Stretch.  Daily chair exercises when unable to walk.         This is a list of the screening recommended for you and due dates:  Health Maintenance  Topic Date Due   Mammogram  02/12/2020   Zoster (Shingles) Vaccine (1 of 2) 02/19/2021*   Tetanus Vaccine  11/19/2021*   Urine Protein Check  03/26/2021   Colon Cancer Screening  07/11/2022   Pneumonia Vaccine  Completed   Flu Shot  Completed   DEXA scan (bone density measurement)  Completed   Hepatitis C Screening: USPSTF Recommendation to screen - Ages 45-79 yo.  Completed   HPV Vaccine  Aged Out   COVID-19 Vaccine  Discontinued  *Topic was postponed. The date shown is not the original due date.     Advanced directives: Yes Patient plans to bring copy.  Conditions/risks identified: None  Next appointment: Follow up in one year for your annual wellness visit    Preventive Care 65 Years and Older, Female Preventive care refers to lifestyle choices and visits with your health care provider that can promote health and wellness. What does preventive care include? A yearly physical exam. This is also called an annual well check. Dental exams once or twice a year. Routine eye  exams. Ask your health care provider how often you should have your eyes checked. Personal lifestyle choices, including: Daily care of your teeth and gums. Regular physical activity. Eating a healthy diet. Avoiding tobacco and drug use. Limiting alcohol use. Practicing safe sex. Taking low-dose aspirin every day. Taking vitamin and mineral supplements as recommended by your health care provider. What happens during an annual well check? The services and screenings done by your health care provider during your annual well check will depend on your age, overall health, lifestyle risk factors, and family history of disease. Counseling  Your health care provider may ask you questions about your: Alcohol use. Tobacco use. Drug use. Emotional well-being. Home and relationship well-being. Sexual activity. Eating habits. History of falls. Memory and ability to understand (cognition). Work and work Statistician. Reproductive health. Screening  You may have the following tests or measurements: Height, weight, and BMI. Blood pressure. Lipid and cholesterol levels. These may be checked every 5 years, or more frequently if you are over 75 years old. Skin check. Lung cancer screening. You may have this screening every year starting at age 52 if you have a 30-pack-year history of smoking and currently smoke or have quit within the past 15 years. Fecal occult blood test (FOBT) of the stool. You may have this test every year starting at age 63. Flexible sigmoidoscopy or colonoscopy. You may have a sigmoidoscopy  every 5 years or a colonoscopy every 10 years starting at age 69. Hepatitis C blood test. Hepatitis B blood test. Sexually transmitted disease (STD) testing. Diabetes screening. This is done by checking your blood sugar (glucose) after you have not eaten for a while (fasting). You may have this done every 1-3 years. Bone density scan. This is done to screen for osteoporosis. You may have  this done starting at age 72. Mammogram. This may be done every 1-2 years. Talk to your health care provider about how often you should have regular mammograms. Talk with your health care provider about your test results, treatment options, and if necessary, the need for more tests. Vaccines  Your health care provider may recommend certain vaccines, such as: Influenza vaccine. This is recommended every year. Tetanus, diphtheria, and acellular pertussis (Tdap, Td) vaccine. You may need a Td booster every 10 years. Zoster vaccine. You may need this after age 70. Pneumococcal 13-valent conjugate (PCV13) vaccine. One dose is recommended after age 9. Pneumococcal polysaccharide (PPSV23) vaccine. One dose is recommended after age 56. Talk to your health care provider about which screenings and vaccines you need and how often you need them. This information is not intended to replace advice given to you by your health care provider. Make sure you discuss any questions you have with your health care provider. Document Released: 01/30/2015 Document Revised: 09/23/2015 Document Reviewed: 11/04/2014 Elsevier Interactive Patient Education  2017 [place] Prevention in the Home Falls can cause injuries. They can happen to people of all ages. There are many things you can do to make your home safe and to help prevent falls. What can I do on the outside of my home? Regularly fix the edges of walkways and driveways and fix any cracks. Remove anything that might make you trip as you walk through a door, such as a raised step or threshold. Trim any bushes or trees on the path to your home. Use bright outdoor lighting. Clear any walking paths of anything that might make someone trip, such as rocks or tools. Regularly check to see if handrails are loose or broken. Make sure that both sides of any steps have handrails. Any raised decks and porches should have guardrails on the edges. Have any leaves,  snow, or ice cleared regularly. Use sand or salt on walking paths during winter. Clean up any spills in your garage right away. This includes oil or grease spills. What can I do in the bathroom? Use night lights. Install grab bars by the toilet and in the tub and shower. Do not use towel bars as grab bars. Use non-skid mats or decals in the tub or shower. If you need to sit down in the shower, use a plastic, non-slip stool. Keep the floor dry. Clean up any water that spills on the floor as soon as it happens. Remove soap buildup in the tub or shower regularly. Attach bath mats securely with double-sided non-slip rug tape. Do not have throw rugs and other things on the floor that can make you trip. What can I do in the bedroom? Use night lights. Make sure that you have a light by your bed that is easy to reach. Do not use any sheets or blankets that are too big for your bed. They should not hang down onto the floor. Have a firm chair that has side arms. You can use this for support while you get dressed. Do not have throw rugs and other things  on the floor that can make you trip. What can I do in the kitchen? Clean up any spills right away. Avoid walking on wet floors. Keep items that you use a lot in easy-to-reach places. If you need to reach something above you, use a strong step stool that has a grab bar. Keep electrical cords out of the way. Do not use floor polish or wax that makes floors slippery. If you must use wax, use non-skid floor wax. Do not have throw rugs and other things on the floor that can make you trip. What can I do with my stairs? Do not leave any items on the stairs. Make sure that there are handrails on both sides of the stairs and use them. Fix handrails that are broken or loose. Make sure that handrails are as long as the stairways. Check any carpeting to make sure that it is firmly attached to the stairs. Fix any carpet that is loose or worn. Avoid having throw  rugs at the top or bottom of the stairs. If you do have throw rugs, attach them to the floor with carpet tape. Make sure that you have a light switch at the top of the stairs and the bottom of the stairs. If you do not have them, ask someone to add them for you. What else can I do to help prevent falls? Wear shoes that: Do not have high heels. Have rubber bottoms. Are comfortable and fit you well. Are closed at the toe. Do not wear sandals. If you use a stepladder: Make sure that it is fully opened. Do not climb a closed stepladder. Make sure that both sides of the stepladder are locked into place. Ask someone to hold it for you, if possible. Clearly mark and make sure that you can see: Any grab bars or handrails. First and last steps. Where the edge of each step is. Use tools that help you move around (mobility aids) if they are needed. These include: Canes. Walkers. Scooters. Crutches. Turn on the lights when you go into a dark area. Replace any light bulbs as soon as they burn out. Set up your furniture so you have a clear path. Avoid moving your furniture around. If any of your floors are uneven, fix them. If there are any pets around you, be aware of where they are. Review your medicines with your doctor. Some medicines can make you feel dizzy. This can increase your chance of falling. Ask your doctor what other things that you can do to help prevent falls. This information is not intended to replace advice given to you by your health care provider. Make sure you discuss any questions you have with your health care provider. Document Released: 10/30/2008 Document Revised: 06/11/2015 Document Reviewed: 02/07/2014 Elsevier Interactive Patient Education  2017 [place].  Opioid Pain Medicine Management Opioids are powerful medicines that are used to treat moderate to severe pain. When used for short periods of time, they can help you to: Sleep better. Do better in physical or  occupational therapy. Feel better in the first few days after an injury. Recover from surgery. Opioids should be taken with the supervision of a trained health care provider. They should be taken for the shortest period of time possible. This is because opioids can be addictive, and the longer you take opioids, the greater your risk of addiction. This addiction can also be called opioid use disorder. What are the risks? Using opioid pain medicines for longer than 3 days  increases your risk of side effects. Side effects include: Constipation. Nausea and vomiting. Breathing difficulties (respiratory depression). Drowsiness. Confusion. Opioid use disorder. Itching. Taking opioid pain medicine for a long period of time can affect your ability to do daily tasks. It also puts you at risk for: Motor vehicle crashes. Depression. Suicide. Heart attack. Overdose, which can be life-threatening. What is a pain treatment plan? A pain treatment plan is an agreement between you and your health care provider. Pain is unique to each person, and treatments vary depending on your condition. To manage your pain, you and your health care provider need to work together. To help you do this: Discuss the goals of your treatment, including how much pain you might expect to have and how you will manage the pain. Review the risks and benefits of taking opioid medicines. Remember that a good treatment plan uses more than one approach and minimizes the chance of side effects. Be honest about the amount of medicines you take and about any drug or alcohol use. Get pain medicine prescriptions from only one health care provider. Pain can be managed with many types of alternative treatments. Ask your health care provider to refer you to one or more specialists who can help you manage pain through: Physical or occupational therapy. Counseling (cognitive behavioral therapy). Good  nutrition. Biofeedback. Massage. Meditation. Non-opioid medicine. Following a gentle exercise program. How to use opioid pain medicine Taking medicine Take your pain medicine exactly as told by your health care provider. Take it only when you need it. If your pain gets less severe, you may take less than your prescribed dose if your health care provider approves. If you are not having pain, do nottake pain medicine unless your health care provider tells you to take it. If your pain is severe, do nottry to treat it yourself by taking more pills than instructed on your prescription. Contact your health care provider for help. Write down the times when you take your pain medicine. It is easy to become confused while on pain medicine. Writing the time can help you avoid overdose. Take other over-the-counter or prescription medicines only as told by your health care provider. Keeping yourself and others safe  While you are taking opioid pain medicine: Do not drive, use machinery, or power tools. Do not sign legal documents. Do not drink alcohol. Do not take sleeping pills. Do not supervise children by yourself. Do not do activities that require climbing or being in high places. Do not go to a lake, river, ocean, spa, or swimming pool. Do not share your pain medicine with anyone. Keep pain medicine in a locked cabinet or in a secure area where pets and children cannot reach it. Stopping your use of opioids If you have been taking opioid medicine for more than a few weeks, you may need to slowly decrease (taper) how much you take until you stop completely. Tapering your use of opioids can decrease your risk of symptoms of withdrawal, such as: Pain and cramping in the abdomen. Nausea. Sweating. Sleepiness. Restlessness. Uncontrollable shaking (tremors). Cravings for the medicine. Do not attempt to taper your use of opioids on your own. Talk with your health care provider about how to do  this. Your health care provider may prescribe a step-down schedule based on how much medicine you are taking and how long you have been taking it. Getting rid of leftover pills Do not save any leftover pills. Get rid of leftover pills safely by: Taking the  medicine to a prescription take-back program. This is usually offered by the county or Event organiser. Bringing them to a pharmacy that has a drug disposal container. Flushing them down the toilet. Check the label or package insert of your medicine to see whether this is safe to do. Throwing them out in the trash. Check the label or package insert of your medicine to see whether this is safe to do. If it is safe to throw it out, remove the medicine from the original container, put it into a sealable bag or container, and mix it with used coffee grounds, food scraps, dirt, or cat litter before putting it in the trash. Follow these instructions at home: Activity Do exercises as told by your health care provider. Avoid activities that make your pain worse. Return to your normal activities as told by your health care provider. Ask your health care provider what activities are safe for you. General instructions You may need to take these actions to prevent or treat constipation: Drink enough fluid to keep your urine pale yellow. Take over-the-counter or prescription medicines. Eat foods that are high in fiber, such as beans, whole grains, and fresh fruits and vegetables. Limit foods that are high in fat and processed sugars, such as fried or sweet foods. Keep all follow-up visits. This is important. Where to find support If you have been taking opioids for a long time, you may benefit from receiving support for quitting from a local support group or counselor. Ask your health care provider for a referral to these resources in your area. Where to find more information Centers for Disease Control and Prevention (CDC): http://www.wolf.info/ U.[place] Food and  Drug Administration (FDA): [place] Get help right away if: You may have taken too much of an opioid (overdosed). Common symptoms of an overdose: Your breathing is slower or more shallow than normal. You have a very slow heartbeat (pulse). You have slurred speech. You have nausea and vomiting. Your pupils become very small. You have other potential symptoms: You are very confused. You faint or feel like you will faint. You have cold, clammy skin. You have blue lips or fingernails. You have thoughts of harming yourself or harming others. These symptoms may represent a serious problem that is an emergency. Do not wait to see if the symptoms will go away. Get medical help right away. Call your local emergency services (911 in the [place] Do not drive yourself to the hospital.  If you ever feel like you may hurt yourself or others, or have thoughts about taking your own life, get help right away. Go to your nearest emergency department or: Call your local emergency services (911 in the [place] Call the [facility] 7176430287 in the [place] Call a suicide crisis helpline, such as the [facility] at 513-428-8104. This is open 24 hours a day in the [place] Text the Crisis Text Line at 272-869-6764 (in the [place].). Summary Opioid medicines can help you manage moderate to severe pain for a short period of time. A pain treatment plan is an agreement between you and your health care provider. Discuss the goals of your treatment, including how much pain you might expect to have and how you will manage the pain. If you think that you or someone else may have taken too much of an opioid, get medical help right away. This information is not intended to replace advice given to you by your health care provider. Make sure you discuss any  questions you have with your health care provider. Document Revised: 04/15/2020 Document Reviewed: 04/15/2020 Elsevier Patient  Education  [place].

## 2020-11-26 ENCOUNTER — Telehealth: Payer: Self-pay

## 2020-11-26 NOTE — Telephone Encounter (Signed)
Patient called and notified that her Ozempic 33m/3mL pens were here from [employer] patient's assistance was here for pick up. Pt stated that she should be by tomorrow, 11/10 ri pick up.

## 2020-11-27 NOTE — Telephone Encounter (Addendum)
Pt picked up Ozempic 11/11

## 2020-12-01 ENCOUNTER — Ambulatory Visit (INDEPENDENT_AMBULATORY_CARE_PROVIDER_SITE_OTHER): Payer: Medicare Other | Admitting: Family Medicine

## 2020-12-01 ENCOUNTER — Encounter (INDEPENDENT_AMBULATORY_CARE_PROVIDER_SITE_OTHER): Payer: Self-pay | Admitting: Family Medicine

## 2020-12-01 ENCOUNTER — Other Ambulatory Visit: Payer: Self-pay

## 2020-12-01 VITALS — BP 112/73 | HR 81 | Temp 98.1°F | Ht 61.0 in | Wt 192.0 lb

## 2020-12-01 DIAGNOSIS — Z6841 Body Mass Index (BMI) 40.0 and over, adult: Secondary | ICD-10-CM

## 2020-12-01 DIAGNOSIS — E785 Hyperlipidemia, unspecified: Secondary | ICD-10-CM | POA: Diagnosis not present

## 2020-12-01 DIAGNOSIS — E1169 Type 2 diabetes mellitus with other specified complication: Secondary | ICD-10-CM

## 2020-12-02 NOTE — Progress Notes (Signed)
Chief Complaint:   OBESITY Morgan Hurst is here to discuss her progress with her obesity treatment plan along with follow-up of her obesity related diagnoses. See Medical Weight Management Flowsheet for complete bioelectrical impedance results.  Today's visit was #: 76 Starting weight: 213 lbs Starting date: 06/30/2019 Weight change since last visit: 3 lbs Total lbs lost to date: 21 lbs Total weight loss percentage to date: -9.86%  Nutrition Plan: Lower carbohydrate, vegetable and lean protein rich diet plan for 70-80% of the time. Activity: None. Anti-obesity medications: Ozempic 1 mg subcutaneously weekly. Reported side effects: None.  Interim History: Morgan Hurst says she is doing well.  She is happy with the plan and progress.  She is due for her yearly patient assistance paperwork.  Assessment/Plan:   1. Type 2 diabetes mellitus with other specified complication, without long-term current use of insulin (HCC) Diabetes Mellitus: Not at goal. Medication: Ozempic 1 mg subcutaneously weekly. Issues reviewed: blood sugar goals, complications of diabetes mellitus, hypoglycemia prevention and treatment, exercise, and nutrition.  Patient assistance through PCP (thank you!).  I would recommend increasing Ozempic to 2 mg weekly (or 1 mg subcutaneously twice weekly if on backorder).  Plan:  Continue Ozempic 1 mg .subu weekly.  The patient will continue to focus on protein-rich, low simple carbohydrate foods. We reviewed the importance of hydration, regular exercise for stress reduction, and restorative sleep.   Lab Results  Component Value Date   HGBA1C 5.3 10/30/2020   HGBA1C 5.5 07/14/2020   HGBA1C 5.2 09/25/2019   Lab Results  Component Value Date   MICROALBUR 1.7 03/26/2020   LDLCALC 103 (H) 10/30/2020   CREATININE 0.70 10/30/2020   2. Hyperlipidemia associated with type 2 diabetes mellitus ([place]) Course: Not optimized. Lipid-lowering medications: Welchol 625 mg daily, Crestor (increased  after last visit).   Plan: Dietary changes: Increase soluble fiber, decrease simple carbohydrates, decrease saturated fat. Exercise changes: Moderate to vigorous-intensity aerobic activity 150 minutes per week or as tolerated. We will continue to monitor along with PCP/specialists as it pertains to her weight loss journey.  Lab Results  Component Value Date   CHOL 174 10/30/2020   HDL 45.80 10/30/2020   LDLCALC 103 (H) 10/30/2020   LDLDIRECT 177.7 12/25/2012   TRIG 124.0 10/30/2020   CHOLHDL 4 10/30/2020   Lab Results  Component Value Date   ALT 11 10/30/2020   AST 14 10/30/2020   ALKPHOS 74 10/30/2020   BILITOT 0.4 10/30/2020   The 10-year ASCVD risk score (Arnett DK, et al., 2019) is: 20.5%   Values used to calculate the score:     Age: 75 years     Sex: Female     Is Non-Hispanic African American: No     Diabetic: Yes     Tobacco smoker: No     Systolic Blood Pressure: 161 mmHg     Is BP treated: No     HDL Cholesterol: 45.8 mg/dL     Total Cholesterol: 174 mg/dL  3. Obesity with current BMI 36.3  Course: Morgan Hurst is currently in the action stage of change. As such, her goal is to continue with weight loss efforts.   Nutrition goals: She has agreed to following a lower carbohydrate, vegetable and lean protein rich diet plan.   Exercise goals:  As tolerated.  Behavioral modification strategies: increasing lean protein intake, decreasing simple carbohydrates, increasing vegetables, and increasing water intake.  Morgan Hurst has agreed to follow-up with our clinic in 4 weeks. She was informed of  the importance of frequent follow-up visits to maximize her success with intensive lifestyle modifications for her multiple health conditions.   Objective:   Blood pressure 112/73, pulse 81, temperature 98.1 F (36.7 C), temperature source Oral, height 5' 1"  (1.549 m), weight 192 lb (87.1 kg), SpO2 97 %. Body mass index is 36.28 kg/m.  General: Cooperative, alert, well developed, in  no acute distress. HEENT: Conjunctivae and lids unremarkable. Cardiovascular: Regular rhythm.  Lungs: Normal work of breathing. Neurologic: No focal deficits.   Lab Results  Component Value Date   CREATININE 0.70 10/30/2020   BUN 17 10/30/2020   NA 141 10/30/2020   K 4.2 10/30/2020   CL 105 10/30/2020   CO2 29 10/30/2020   Lab Results  Component Value Date   ALT 11 10/30/2020   AST 14 10/30/2020   ALKPHOS 74 10/30/2020   BILITOT 0.4 10/30/2020   Lab Results  Component Value Date   HGBA1C 5.3 10/30/2020   HGBA1C 5.5 07/14/2020   HGBA1C 5.2 09/25/2019   HGBA1C 5.9 05/15/2019   HGBA1C 6.0 09/04/2018   Lab Results  Component Value Date   INSULIN 20.9 06/20/2019   Lab Results  Component Value Date   TSH 3.060 06/20/2019   Lab Results  Component Value Date   CHOL 174 10/30/2020   HDL 45.80 10/30/2020   LDLCALC 103 (H) 10/30/2020   LDLDIRECT 177.7 12/25/2012   TRIG 124.0 10/30/2020   CHOLHDL 4 10/30/2020   Lab Results  Component Value Date   VD25OH 35.49 09/04/2018   VD25OH 27.53 (L) 04/02/2018   VD25OH 25.42 (L) 12/25/2017   Lab Results  Component Value Date   WBC 7.8 06/20/2019   HGB 14.9 06/20/2019   HCT 44.5 06/20/2019   MCV 89 06/20/2019   PLT 189 06/20/2019   Lab Results  Component Value Date   IRON 59 09/07/2018   FERRITIN 87.1 09/07/2018   Attestation Statements:   Reviewed by clinician on day of visit: allergies, medications, problem list, medical history, surgical history, family history, social history, and previous encounter notes.  I, Water quality scientist, CMA, am acting as transcriptionist for Briscoe Deutscher, DO  I have reviewed the above documentation for accuracy and completeness, and I agree with the above. -  Briscoe Deutscher, DO, MS, FAAFP, DABOM - Family and Bariatric Medicine.

## 2020-12-18 ENCOUNTER — Ambulatory Visit: Payer: Medicare Other | Admitting: Family Medicine

## 2020-12-21 ENCOUNTER — Other Ambulatory Visit: Payer: Medicare Other

## 2020-12-22 ENCOUNTER — Ambulatory Visit: Payer: Medicare Other

## 2020-12-22 ENCOUNTER — Ambulatory Visit (INDEPENDENT_AMBULATORY_CARE_PROVIDER_SITE_OTHER): Payer: Medicare Other | Admitting: Pharmacist

## 2020-12-22 ENCOUNTER — Other Ambulatory Visit: Payer: Medicare Other

## 2020-12-22 ENCOUNTER — Telehealth: Payer: Self-pay | Admitting: Family

## 2020-12-22 DIAGNOSIS — K760 Fatty (change of) liver, not elsewhere classified: Secondary | ICD-10-CM

## 2020-12-22 DIAGNOSIS — E1169 Type 2 diabetes mellitus with other specified complication: Secondary | ICD-10-CM

## 2020-12-22 DIAGNOSIS — E785 Hyperlipidemia, unspecified: Secondary | ICD-10-CM

## 2020-12-22 NOTE — Chronic Care Management (AMB) (Signed)
Chronic Care Management CCM Pharmacy Note  12/22/2020 Name:  Morgan Hurst MRN:  [id number] DOB:  11/21/1945  Summary: - Calls to request support in reapplying for Ozempic assistance  Recommendations/Changes made from today's visit: - Will collaborate with PCP to apply for Ozempic 2 mg weekly per Dr. [name]  Subjective: Morgan Hurst is an 75 y.o. year old female who is a primary patient of Burnard Hawthorne, FNP.  The CCM team was consulted for assistance with disease management and care coordination needs.    Engaged with patient by telephone for  medication access  for pharmacy case management and/or care coordination services.   Objective:  Medications Reviewed Today     Reviewed by Karren Cobble, [place] (Certified Medical Assistant) on 12/01/20 at 1023  Med List Status: <None>   Medication Order Taking? Sig Documenting Provider Last Dose Status Informant  acetaminophen (TYLENOL) 650 MG CR tablet [id number]  Take 650 mg by mouth every 8 (eight) hours as needed for pain. [provider]  Active            Med Note Mayo Ao Feb 17, 2020 10:54 AM) Taking 650 mg BID  buPROPion (WELLBUTRIN XL) 150 MG 24 hr tablet [id number]  Take 1 tablet (150 mg total) by mouth daily. Mellody Dance, DO  Active   calcium carbonate (OSCAL) 1500 (600 Ca) MG TABS tablet [id number]  Take 600 mg by mouth daily. [provider]  Active   cholecalciferol (VITAMIN D3) 25 MCG (1000 UNIT) tablet [id number]  Take 2,000 Units by mouth daily. [provider]  Active   colesevelam (WELCHOL) 625 MG tablet [id number]  Take 625 mg by mouth daily as needed. [provider]  Active            Med Note Mayo Ao Feb 17, 2020 11:02 AM) Taking PRN diarrhea w/ nexium  cyanocobalamin (,VITAMIN B-12,) 1000 MCG/ML injection [id number]  INJECT 1 ML ONCE PER MONTH Burnard Hawthorne, FNP  Active   esomeprazole (NEXIUM) 40 MG capsule [id number]  Take  40 mg by mouth daily as needed. [provider]  Active            Med Note Kelby Aline Jun 10, 2020  3:41 PM) Taking PRN - ~ 1-2 times weekly  gabapentin (NEURONTIN) 300 MG capsule [id number]  TAKE 1 CAPSULE(300 MG) BY MOUTH AT BEDTIME Lyndal Pulley, DO  Active            Med Note Albin Felling, [facility] E   Fri Oct 30, 2020  9:03 AM) PRN  metolazone (ZAROXOLYN) 5 MG tablet [id number]  Take 5 mg by mouth daily. [provider]  Active            Med Note Darnelle Maffucci, Lavonna Rua Jun 10, 2020  3:33 PM) Taking metolazone ~ 3-4 days a week, not the day the takes torsemide  ondansetron (ZOFRAN-ODT) 4 MG disintegrating tablet [id number]  Take 4 mg by mouth as needed. [provider]  Active            Med Note Darnelle Maffucci, Arville Lime   Wed Jun 10, 2020  3:41 PM) Taking ~ once daily  potassium chloride (KLOR-CON) 10 MEQ tablet [id number]  Take 1 tablet by mouth twice daily. **Take with Metolazone.Briscoe Deutscher, DO  Active            Med Note (Marieta Markov E  Mon Mar 16, 2020 10:40 AM) 1 daily   rOPINIRole (REQUIP) 0.25 MG tablet [id number]  TAKE 1 TABLET(0.25 MG) BY MOUTH AT BEDTIME AS NEEDED Burnard Hawthorne, FNP  Active   rosuvastatin (CRESTOR) 10 MG tablet [id number]  Take 1 tablet (10 mg total) by mouth daily. Burnard Hawthorne, FNP  Active   Semaglutide, 1 MG/DOSE, 4 MG/3ML [facility] [id number]  Inject 1 mg as directed once a week. Burnard Hawthorne, FNP  Active   sennosides-docusate sodium (SENOKOT-S) 8.6-50 MG tablet [id number]  Take 1 tablet by mouth daily. [provider]  Active   tiZANidine (ZANAFLEX) 2 MG tablet [id number]  TAKE 1 TO 2 TABLETS(2 TO 4 MG) BY MOUTH EVERY 8 HOURS AS NEEDED FOR MUSCLE SPASMS Gregor Hams, MD  Active   torsemide (DEMADEX) 20 MG tablet [id number]  Take 20 mg by mouth daily as needed. [provider]  Active            Med Note Darnelle Maffucci, Lavonna Rua Jun 10, 2020  3:33 PM) Takes 1-2 times weekly when  swelling is significant  traMADol (ULTRAM) 50 MG tablet [id number]  Take 1 tablet (50 mg total) by mouth daily as needed. Burnard Hawthorne, FNP  Active   Vitamin E 400 units TABS [id number]  Take 400 Units by mouth daily. [provider]  Active             Pertinent Labs:   Lab Results  Component Value Date   HGBA1C 5.3 10/30/2020   Lab Results  Component Value Date   CHOL 174 10/30/2020   HDL 45.80 10/30/2020   LDLCALC 103 (H) 10/30/2020   LDLDIRECT 177.7 12/25/2012   TRIG 124.0 10/30/2020   CHOLHDL 4 10/30/2020   Lab Results  Component Value Date   CREATININE 0.70 10/30/2020   BUN 17 10/30/2020   NA 141 10/30/2020   K 4.2 10/30/2020   CL 105 10/30/2020   CO2 29 10/30/2020    SDOH:  (Social Determinants of Health) assessments and interventions performed:  SDOH Interventions    Flowsheet Row Most Recent Value  SDOH Interventions   Financial Strain Interventions Intervention Not Indicated       CCM Care Plan  Review of patient past medical history, allergies, medications, health status, including review of consultants reports, laboratory and other test data, was performed as part of comprehensive evaluation and provision of chronic care management services.   Care Plan : Medication Management  Updates made by De Hollingshead, RPH-CPP since 12/22/2020 12:00 AM     Problem: Weight Management, Pre-Diabetes, NAFLD, Osteoporosis      Long-Range Goal: Disease Progression Prevention   Recent Progress: On track  Priority: High  Note:   Current Barriers:  Unable to independently afford treatment regimen Unable to independently achieve control of weight   Pharmacist Clinical Goal(s):  Over the next 90 days, patient will verbalize ability to afford treatment regimen Over the next 90 days, patient will achieve improvement in weight   Interventions: 1:1 collaboration with Burnard Hawthorne, FNP regarding development and update of comprehensive plan  of care as evidenced by provider attestation and co-signature Inter-disciplinary care team collaboration (see longitudinal plan of care) Comprehensive medication review performed; medication list updated in electronic medical record  Obesity, Pre-diabetes, Impaired Fasting Glucose: Diabetes at goal, but unable to achieve goal weight loss of 5-10% with diet and exercise alone; current treatment: Ozempic 1 mg weekly Baseline weight: 213 lbs; most recent weight:  192 lbs (10%) Calls today to request support in reapplying for assistance for Ozempic for 2023. Per chart review, Dr. [name] recommends increasing to 2 mg weekly. Will collaborate w/ CPhT, patient, and providers to reapply for patient assistance for [place] for 2023.  Arthritis, spinal stenosis, hip pain, cervicular radiculopathy Improved per patient report. Current treatment: tramadol 50 mg daily, 1/2-1 tab ~ weekly APAP 650 mg BID, gabapentin 200 mg QPM - though using 100 mg PRN, ~2-3 nights weekly, diclofenac ER 75 mg BID (using 0-1 daily). No mention of increased sedation. Previously recommended to continue current regimen in collaboration with orthopedics and PCP  ASCVD Risk Reduction: Uncontrolled per last lipid panel; current regimen: rosuvastatin 10 mg daily  Antiplatelet regimen: aspirin 81 mg daily Recommended to continue current regimen at this time. Follow next lipid panel. If LDL not at goal <70, recommend increasing rosuvastatin to 20 mg daily  GI concerns: Managed by [facility]; NAFLD w/o scarring/fibrosis. Improved with continued weight loss.  PRN use of colesevelam (diarrhea), docusate (constipation), ondansetron (nausea), esomeprazole 40 mg (GERD). Infrequent use overall of all of these agents.  Previously recommended to continue current PRN symptom regimen at this time, given infrequent use and continue to collaboration with GI.   Edema w/ SOB: Managed, current regimen: metolazone 5 mg PRN, torsemide 20 mg PRN,  potassium 10 mg PRN Most recent PFTs: Pre FEV1 1.59, FVC 2.0; FEV1/FVC: 79%; post FEV1 1.72, FVC 2.11; post FEV1/FVC 82%. No mention of obstructive or restrictive disease, breathing changes related to body habitus Previously recommended to continue current regimen at this time along with collaboration with multidisciplinary team.   Patient Goals/Self-Care Activities Over the next 90 days, patient will:  - collaborate with provider on medication access solutions target a minimum of 150 minutes of moderate intensity exercise weekly engage in dietary modifications by continuing to reduce saturated fats, reducing carbohydrate portions      Plan: Telephone follow up appointment with care management team member scheduled for:  3 months  Catie Darnelle Maffucci, PharmD, [place], CPP Clinical Pharmacist [employer] at [employer] [phone]

## 2020-12-22 NOTE — Patient Instructions (Signed)
Visit Information  Following are the goals we discussed today:  Patient Goals/Self-Care Activities Over the next 90 days, patient will:  - collaborate with provider on medication access solutions target a minimum of 150 minutes of moderate intensity exercise weekly engage in dietary modifications by continuing to reduce saturated fats, reducing carbohydrate portions        Plan: Telephone follow up appointment with care management team member scheduled for:  3 months   Catie Darnelle Maffucci, PharmD, [place], CPP Clinical Pharmacist [place] at [facility] [phone]     Please call the care guide team at [phone] if you need to cancel or reschedule your appointment.   Patient verbalizes understanding of instructions provided today and agrees to view in [place].

## 2020-12-22 NOTE — Telephone Encounter (Signed)
Returned call. See CCM documentation

## 2020-12-22 NOTE — Telephone Encounter (Signed)
Patient has questions about Ozempic

## 2020-12-23 NOTE — Telephone Encounter (Signed)
Catie,  As discussed,  pt is complete with pneumococcal 23 and DOES NOT NEED another dose.   Please CANCEL pneumococcal 23 vaccine for tomorrow.   Catie, please call pt and let her know   Judson Roch, please ensure appt is OFF of schedule

## 2020-12-23 NOTE — Telephone Encounter (Signed)
Spoke with patient, notified that she is currently up to date on pneumonia vaccinations (as PPSV23 was received at or after age 75).   Patient should still have CMP checked tomorrow. As the lab schedule is full, I can't overbook to move her to lab schedule. Sarah, do you have the ability to do so?

## 2020-12-23 NOTE — Telephone Encounter (Signed)
Pt rescheduled tomorrow for CMP & to sign Catie's paperwork. I asked Carolee Rota before scheduling.

## 2020-12-24 ENCOUNTER — Telehealth: Payer: Self-pay | Admitting: Pharmacy Technician

## 2020-12-24 ENCOUNTER — Other Ambulatory Visit: Payer: Self-pay

## 2020-12-24 ENCOUNTER — Telehealth: Payer: Self-pay | Admitting: Pharmacist

## 2020-12-24 ENCOUNTER — Ambulatory Visit: Payer: Medicare Other

## 2020-12-24 ENCOUNTER — Other Ambulatory Visit (INDEPENDENT_AMBULATORY_CARE_PROVIDER_SITE_OTHER): Payer: Medicare Other

## 2020-12-24 DIAGNOSIS — E785 Hyperlipidemia, unspecified: Secondary | ICD-10-CM

## 2020-12-24 DIAGNOSIS — Z596 Low income: Secondary | ICD-10-CM

## 2020-12-24 LAB — COMPREHENSIVE METABOLIC PANEL
ALT: 12 U/L (ref 0–35)
AST: 15 U/L (ref 0–37)
Albumin: 3.9 g/dL (ref 3.5–5.2)
Alkaline Phosphatase: 71 U/L (ref 39–117)
BUN: 22 mg/dL (ref 6–23)
CO2: 32 mEq/L (ref 19–32)
Calcium: 9.8 mg/dL (ref 8.4–10.5)
Chloride: 100 mEq/L (ref 96–112)
Creatinine, Ser: 0.71 mg/dL (ref 0.40–1.20)
GFR: 83.68 mL/min (ref >60.00)
Glucose, Bld: 95 mg/dL (ref 70–99)
Potassium: 3.4 mEq/L — ABNORMAL LOW (ref 3.5–5.1)
Sodium: 140 mEq/L (ref 135–145)
Total Bilirubin: 0.3 mg/dL (ref 0.2–1.2)
Total Protein: 7.6 g/dL (ref 6.0–8.3)

## 2020-12-24 NOTE — Telephone Encounter (Signed)
Patient and provider portions of [employer] application received. Submitted today. Will collaborate w/ CphT for follow up

## 2020-12-24 NOTE — Progress Notes (Signed)
[facility])                                            [place] Team    12/24/2020  FRANCOISE CHOJNOWSKI May 04, 1945 [id number]                                      Medication Assistance Referral  Referral From: [facility] Embedded RPh Catie T.   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 Faxed completed application and required documents into [employer].   Ainhoa Rallo P. Karon Heckendorn, [place]  [phone]

## 2020-12-25 ENCOUNTER — Other Ambulatory Visit: Payer: Medicare Other

## 2020-12-26 ENCOUNTER — Other Ambulatory Visit (INDEPENDENT_AMBULATORY_CARE_PROVIDER_SITE_OTHER): Payer: Self-pay | Admitting: Family Medicine

## 2020-12-26 DIAGNOSIS — F3289 Other specified depressive episodes: Secondary | ICD-10-CM

## 2020-12-29 ENCOUNTER — Other Ambulatory Visit: Payer: Self-pay

## 2020-12-29 ENCOUNTER — Ambulatory Visit (INDEPENDENT_AMBULATORY_CARE_PROVIDER_SITE_OTHER): Payer: Medicare Other | Admitting: Family Medicine

## 2020-12-29 ENCOUNTER — Encounter (INDEPENDENT_AMBULATORY_CARE_PROVIDER_SITE_OTHER): Payer: Self-pay | Admitting: Family Medicine

## 2020-12-29 VITALS — BP 106/69 | HR 73 | Temp 97.8°F | Ht 61.0 in | Wt 193.0 lb

## 2020-12-29 DIAGNOSIS — E66813 Obesity, class 3: Secondary | ICD-10-CM

## 2020-12-29 DIAGNOSIS — K760 Fatty (change of) liver, not elsewhere classified: Secondary | ICD-10-CM | POA: Diagnosis not present

## 2020-12-29 DIAGNOSIS — Z6841 Body Mass Index (BMI) 40.0 and over, adult: Secondary | ICD-10-CM

## 2020-12-29 DIAGNOSIS — F3289 Other specified depressive episodes: Secondary | ICD-10-CM

## 2020-12-29 DIAGNOSIS — E1169 Type 2 diabetes mellitus with other specified complication: Secondary | ICD-10-CM

## 2020-12-29 MED ORDER — BUPROPION HCL ER (XL) 150 MG PO TB24: 150.0000 mg | ORAL_TABLET | Freq: Every day | ORAL | 2 refills | Status: DC

## 2020-12-30 NOTE — Progress Notes (Signed)
Chief Complaint:   OBESITY Morgan Hurst is here to discuss her progress with her obesity treatment plan along with follow-up of her obesity related diagnoses. See Medical Weight Management Flowsheet for complete bioelectrical impedance results.  Today's visit was #: 20 Starting weight: 213 lbs Starting date: 06/30/2019 Weight change since last visit: +1 lb Total lbs lost to date: 20 lbs Total weight loss percentage to date: -9.39%  Nutrition Plan: Lower carbohydrate, vegetable and lean protein rich diet plan for 40% of the time. Activity: None. Anti-obesity medications: Ozempic 1 mg subcutaneously weekly. Reported side effects: None.  Interim History: She still endorses polyphagia and is still having sweets cravings, but they are better.  She says that Wellbutrin is helping.  She reports that she developed weakness in her legs so she stopped her statin.  She is going back to taking it 2-3 times per week.  Assessment/Plan:   1. Type 2 diabetes mellitus with other specified complication, without long-term current use of insulin (HCC) Diabetes Mellitus: At goal. Medication: Ozempic 1 mg subcutaneously weekly. Issues reviewed: blood sugar goals, complications of diabetes mellitus, hypoglycemia prevention and treatment, exercise, and nutrition.  Plan:  Increase Ozempic to 2 mg subcutaneously weekly (assistance paperwork has been filled out at her PCPs office). The patient will continue to focus on protein-rich, low simple carbohydrate foods. We reviewed the importance of hydration, regular exercise for stress reduction, and restorative sleep.   Lab Results  Component Value Date   HGBA1C 5.3 10/30/2020   HGBA1C 5.5 07/14/2020   HGBA1C 5.2 09/25/2019   Lab Results  Component Value Date   MICROALBUR 1.7 03/26/2020   LDLCALC 103 (H) 10/30/2020   CREATININE 0.71 12/24/2020   2. Fatty liver disease, nonalcoholic The mainstay of treatment of NAFLD includes lifestyle modification to achieve  weight loss, at least 7% of current body weight. Low carbohydrate diets can be beneficial in improving NAFLD liver histology. Additionally, exercise, even the absence of weight loss can have beneficial effects on the patient's metabolic profile and liver health. We recommend that their metabolic comorbidities be aggressively managed, as patients with NAFLD are at increased risk of coronary artery disease.  3. Emotional eating tendencies Improving, but not optimized. Medication: Wellbutrin XL 150 mg daily.   Plan: Continue Wellbutrin XL 150 mg daily.  Will refill today, as per below.  Discussed cues and consequences, how thoughts affect eating, model of thoughts, feelings, and behaviors, and strategies for change by focusing on the cue. Discussed cognitive distortions, coping thoughts, and how to change your thoughts.  - Refill buPROPion (WELLBUTRIN XL) 150 MG 24 hr tablet; Take 1 tablet (150 mg total) by mouth daily.  Dispense: 90 tablet; Refill: 2  4. Obesity with current BMI 36.6  Course: Morgan Hurst is currently in the action stage of change. As such, her goal is to continue with weight loss efforts.   Nutrition goals: She has agreed to following a lower carbohydrate, vegetable and lean protein rich diet plan.   Exercise goals: Older adults should follow the adult guidelines. When older adults cannot meet the adult guidelines, they should be as physically active as their abilities and conditions will allow.  Older adults should do exercises that maintain or improve balance if they are at risk of falling.   Behavioral modification strategies: increasing lean protein intake, decreasing simple carbohydrates, increasing vegetables, and increasing water intake.  Phynix has agreed to follow-up with our clinic in 4 weeks. She was informed of the importance of frequent  follow-up visits to maximize her success with intensive lifestyle modifications for her multiple health conditions.   Objective:   Blood  pressure 106/69, pulse 73, temperature 97.8 F (36.6 C), temperature source Oral, height 5' 1"  (1.549 m), weight 193 lb (87.5 kg), SpO2 96 %. Body mass index is 36.47 kg/m.  General: Cooperative, alert, well developed, in no acute distress. HEENT: Conjunctivae and lids unremarkable. Cardiovascular: Regular rhythm.  Lungs: Normal work of breathing. Neurologic: No focal deficits.   Lab Results  Component Value Date   CREATININE 0.71 12/24/2020   BUN 22 12/24/2020   NA 140 12/24/2020   K 3.4 (L) 12/24/2020   CL 100 12/24/2020   CO2 32 12/24/2020   Lab Results  Component Value Date   ALT 12 12/24/2020   AST 15 12/24/2020   ALKPHOS 71 12/24/2020   BILITOT 0.3 12/24/2020   Lab Results  Component Value Date   HGBA1C 5.3 10/30/2020   HGBA1C 5.5 07/14/2020   HGBA1C 5.2 09/25/2019   HGBA1C 5.9 05/15/2019   HGBA1C 6.0 09/04/2018   Lab Results  Component Value Date   INSULIN 20.9 06/20/2019   Lab Results  Component Value Date   TSH 3.060 06/20/2019   Lab Results  Component Value Date   CHOL 174 10/30/2020   HDL 45.80 10/30/2020   LDLCALC 103 (H) 10/30/2020   LDLDIRECT 177.7 12/25/2012   TRIG 124.0 10/30/2020   CHOLHDL 4 10/30/2020   Lab Results  Component Value Date   VD25OH 35.49 09/04/2018   VD25OH 27.53 (L) 04/02/2018   VD25OH 25.42 (L) 12/25/2017   Lab Results  Component Value Date   WBC 7.8 06/20/2019   HGB 14.9 06/20/2019   HCT 44.5 06/20/2019   MCV 89 06/20/2019   PLT 189 06/20/2019   Lab Results  Component Value Date   IRON 59 09/07/2018   FERRITIN 87.1 09/07/2018   Attestation Statements:   Reviewed by clinician on day of visit: allergies, medications, problem list, medical history, surgical history, family history, social history, and previous encounter notes.  I, Water quality scientist, CMA, am acting as transcriptionist for Briscoe Deutscher, DO  I have reviewed the above documentation for accuracy and completeness, and I agree with the above. -  Briscoe Deutscher, DO, MS, FAAFP, DABOM - Family and Bariatric Medicine.

## 2021-01-16 DIAGNOSIS — E1169 Type 2 diabetes mellitus with other specified complication: Secondary | ICD-10-CM

## 2021-01-16 DIAGNOSIS — M1380 Other specified arthritis, unspecified site: Secondary | ICD-10-CM

## 2021-01-26 ENCOUNTER — Encounter (INDEPENDENT_AMBULATORY_CARE_PROVIDER_SITE_OTHER): Payer: Self-pay | Admitting: Family Medicine

## 2021-01-26 ENCOUNTER — Other Ambulatory Visit: Payer: Self-pay

## 2021-01-26 ENCOUNTER — Ambulatory Visit (INDEPENDENT_AMBULATORY_CARE_PROVIDER_SITE_OTHER): Payer: Medicare Other | Admitting: Family Medicine

## 2021-01-26 VITALS — BP 116/75 | HR 82 | Temp 97.4°F | Ht 61.0 in | Wt 195.0 lb

## 2021-01-26 DIAGNOSIS — F3289 Other specified depressive episodes: Secondary | ICD-10-CM

## 2021-01-26 DIAGNOSIS — R110 Nausea: Secondary | ICD-10-CM | POA: Diagnosis not present

## 2021-01-26 DIAGNOSIS — E1169 Type 2 diabetes mellitus with other specified complication: Secondary | ICD-10-CM | POA: Diagnosis not present

## 2021-01-26 DIAGNOSIS — Z6836 Body mass index (BMI) 36.0-36.9, adult: Secondary | ICD-10-CM

## 2021-01-26 MED ORDER — ONDANSETRON 4 MG PO TBDP: 4.0000 mg | ORAL_TABLET | Freq: Three times a day (TID) | ORAL | 0 refills | Status: AC | PRN

## 2021-01-27 ENCOUNTER — Telehealth: Payer: Self-pay | Admitting: Pharmacy Technician

## 2021-01-27 DIAGNOSIS — Z596 Low income: Secondary | ICD-10-CM

## 2021-01-27 NOTE — Progress Notes (Signed)
[facility])                                            [place] Team    01/27/2021  Morgan Hurst October 23, 1945 [id number]  Care coordination call placed to [place] in regard to [place] application.  Spoke to Richardson Landry who informs patient is APPROVED 01/17/21-01/16/22. He informs the Ozempic will process for a refill based on LFD in 2022 and be delivered to the provider's office.  Treena Cosman P. Herley Bernardini, [place]  [phone]

## 2021-01-28 NOTE — Progress Notes (Signed)
Chief Complaint:   OBESITY Morgan Hurst is here to discuss her progress with her obesity treatment plan along with follow-up of her obesity related diagnoses. See Medical Weight Management Flowsheet for complete bioelectrical impedance results.  Today's visit was #: 21 Starting weight: 213 lbs Starting date: 06/30/2019 Weight change since last visit: +2 lbs Total lbs lost to date: 18 lbs Total weight loss percentage to date: -8.45%  Nutrition Plan: Lower carbohydrate, vegetable and lean protein rich diet plan for a small percentage of the time. Activity: None. Anti-obesity medications: Ozempic 1 mg subcutaneously weekly. Reported side effects: None.  Interim History: Morgan Hurst says she indulged over the holidays.  She has not been exercising much.  Assessment/Plan:   1. Type 2 diabetes mellitus with other specified complication, without long-term current use of insulin (HCC) Diabetes Mellitus: At goal. Medication: Ozempic 1 mg subcutaneously weekly. Issues reviewed: blood sugar goals, complications of diabetes mellitus, hypoglycemia prevention and treatment, exercise, and nutrition.  Morgan Hurst gets Ozempic through her PCP, Patient Assistance.    Plan:  Call Pharmacist, Terie Purser, to increase Ozempic to 2 mg weekly. The patient will continue to focus on protein-rich, low simple carbohydrate foods. We reviewed the importance of hydration, regular exercise for stress reduction, and restorative sleep.   Lab Results  Component Value Date   HGBA1C 5.3 10/30/2020   HGBA1C 5.5 07/14/2020   HGBA1C 5.2 09/25/2019   Lab Results  Component Value Date   MICROALBUR 1.7 03/26/2020   LDLCALC 103 (H) 10/30/2020   CREATININE 0.71 12/24/2020   2. Nausea Intermittent, usually if she goes out.  She does not eat prior in order to avoid having to find a bathroom.  Se discussed eating something small.  Consider Imodium if IBS.  - Refill ondansetron (ZOFRAN-ODT) 4 MG disintegrating tablet; Take 1 tablet (4  mg total) by mouth every 8 (eight) hours as needed.  Dispense: 20 tablet; Refill: 0  3. Other depression, with emotional eating Controlled. Medication: Wellbutrin XL 150 mg daily.   Plan: Discussed cues and consequences, how thoughts affect eating, model of thoughts, feelings, and behaviors, and strategies for change by focusing on the cue. Discussed cognitive distortions, coping thoughts, and how to change your thoughts.  4. Obesity with current BMI 36.9  Course: Shalen is currently in the action stage of change. As such, her goal is to continue with weight loss efforts.   Nutrition goals: She has agreed to following a lower carbohydrate, vegetable and lean protein rich diet plan.   Exercise goals: Older adults should follow the adult guidelines. When older adults cannot meet the adult guidelines, they should be as physically active as their abilities and conditions will allow.  Older adults should do exercises that maintain or improve balance if they are at risk of falling.   Behavioral modification strategies: increasing lean protein intake, decreasing simple carbohydrates, increasing vegetables, and increasing water intake.  Cincere has agreed to follow-up with our clinic in 4 weeks. She was informed of the importance of frequent follow-up visits to maximize her success with intensive lifestyle modifications for her multiple health conditions.   Objective:   Blood pressure 116/75, pulse 82, temperature (!) 97.4 F (36.3 C), temperature source Oral, height 5' 1"  (1.549 m), weight 195 lb (88.5 kg), SpO2 98 %. Body mass index is 36.84 kg/m.  General: Cooperative, alert, well developed, in no acute distress. HEENT: Conjunctivae and lids unremarkable. Cardiovascular: Regular rhythm.  Lungs: Normal work of breathing. Neurologic: No focal deficits.  Lab Results  Component Value Date   CREATININE 0.71 12/24/2020   BUN 22 12/24/2020   NA 140 12/24/2020   K 3.4 (L) 12/24/2020   CL 100  12/24/2020   CO2 32 12/24/2020   Lab Results  Component Value Date   ALT 12 12/24/2020   AST 15 12/24/2020   ALKPHOS 71 12/24/2020   BILITOT 0.3 12/24/2020   Lab Results  Component Value Date   HGBA1C 5.3 10/30/2020   HGBA1C 5.5 07/14/2020   HGBA1C 5.2 09/25/2019   HGBA1C 5.9 05/15/2019   HGBA1C 6.0 09/04/2018   Lab Results  Component Value Date   INSULIN 20.9 06/20/2019   Lab Results  Component Value Date   TSH 3.060 06/20/2019   Lab Results  Component Value Date   CHOL 174 10/30/2020   HDL 45.80 10/30/2020   LDLCALC 103 (H) 10/30/2020   LDLDIRECT 177.7 12/25/2012   TRIG 124.0 10/30/2020   CHOLHDL 4 10/30/2020   Lab Results  Component Value Date   VD25OH 35.49 09/04/2018   VD25OH 27.53 (L) 04/02/2018   VD25OH 25.42 (L) 12/25/2017   Lab Results  Component Value Date   WBC 7.8 06/20/2019   HGB 14.9 06/20/2019   HCT 44.5 06/20/2019   MCV 89 06/20/2019   PLT 189 06/20/2019   Lab Results  Component Value Date   IRON 59 09/07/2018   FERRITIN 87.1 09/07/2018   Attestation Statements:   Reviewed by clinician on day of visit: allergies, medications, problem list, medical history, surgical history, family history, social history, and previous encounter notes.  I, Water quality scientist, CMA, am acting as transcriptionist for Briscoe Deutscher, DO  I have reviewed the above documentation for accuracy and completeness, and I agree with the above. -  Briscoe Deutscher, DO, MS, FAAFP, DABOM - Family and Bariatric Medicine.

## 2021-02-10 ENCOUNTER — Encounter: Payer: Self-pay | Admitting: Family

## 2021-02-10 ENCOUNTER — Ambulatory Visit (INDEPENDENT_AMBULATORY_CARE_PROVIDER_SITE_OTHER): Payer: Medicare Other | Admitting: Family

## 2021-02-10 ENCOUNTER — Other Ambulatory Visit: Payer: Self-pay

## 2021-02-10 VITALS — BP 122/72 | HR 87 | Temp 98.2°F | Ht 61.0 in | Wt 203.0 lb

## 2021-02-10 DIAGNOSIS — G8929 Other chronic pain: Secondary | ICD-10-CM

## 2021-02-10 DIAGNOSIS — E785 Hyperlipidemia, unspecified: Secondary | ICD-10-CM

## 2021-02-10 DIAGNOSIS — R8290 Unspecified abnormal findings in urine: Secondary | ICD-10-CM

## 2021-02-10 DIAGNOSIS — M7989 Other specified soft tissue disorders: Secondary | ICD-10-CM | POA: Diagnosis not present

## 2021-02-10 DIAGNOSIS — M545 Low back pain, unspecified: Secondary | ICD-10-CM

## 2021-02-10 DIAGNOSIS — M48062 Spinal stenosis, lumbar region with neurogenic claudication: Secondary | ICD-10-CM

## 2021-02-10 DIAGNOSIS — Z1231 Encounter for screening mammogram for malignant neoplasm of breast: Secondary | ICD-10-CM

## 2021-02-10 LAB — MICROALBUMIN / CREATININE URINE RATIO
Creatinine,U: 105.8 mg/dL
Microalb Creat Ratio: 1.1 mg/g (ref 0.0–30.0)
Microalb, Ur: 1.2 mg/dL (ref 0.0–1.9)

## 2021-02-10 LAB — LIPID PANEL
Cholesterol: 206 mg/dL — ABNORMAL HIGH (ref 0–200)
HDL: 49.4 mg/dL (ref >39.00)
LDL Cholesterol: 131 mg/dL — ABNORMAL HIGH (ref 0–99)
NonHDL: 156.22
Total CHOL/HDL Ratio: 4
Triglycerides: 124 mg/dL (ref 0.0–149.0)
VLDL: 24.8 mg/dL (ref 0.0–40.0)

## 2021-02-10 LAB — BASIC METABOLIC PANEL
BUN: 13 mg/dL (ref 6–23)
CO2: 30 mEq/L (ref 19–32)
Calcium: 9.5 mg/dL (ref 8.4–10.5)
Chloride: 105 mEq/L (ref 96–112)
Creatinine, Ser: 0.72 mg/dL (ref 0.40–1.20)
GFR: 82.21 mL/min (ref >60.00)
Glucose, Bld: 88 mg/dL (ref 70–99)
Potassium: 4 mEq/L (ref 3.5–5.1)
Sodium: 141 mEq/L (ref 135–145)

## 2021-02-10 MED ORDER — TRAMADOL HCL 50 MG PO TABS: 50.0000 mg | ORAL_TABLET | Freq: Every day | ORAL | 1 refills | Status: DC | PRN

## 2021-02-10 NOTE — Progress Notes (Signed)
Subjective:    Patient ID: Morgan Hurst, female    DOB: 1945/11/24, 76 y.o.   MRN: [id number]  CC: Morgan Hurst is a 76 y.o. female who presents today for follow up.   HPI: Feels well today.  No new complaints  Prediabetes-compliant with Ozempic 1 mg, increased to 11m 01/26/21 by Dr WJuleen China  She is tolerating ozempic without nausea.   Arthritis-compliant with tramadol 528mprn. She is no longer on gabapentin. PRN tylenol. She thinks she may be taking diclofenac.   HLD-compliant with rosuvastatin 10 mg taking 3 days per week without arthralgia.  Bilateral edema-Well controlled. compliant with metolazone 5 mg PRN or torsemide 20 mg PRN, potassium 10 mg PRN.  She takes diuretic 1-2 times per week. No sob, leg swelling.   HISTORY:  Past Medical History:  Diagnosis Date   Arthritis    lower back, right hip   B12 deficiency    Back pain    Borderline diabetes    PCP STARTED PT ON METFORMIN IN 2016 DUE TO ELEVATED GLUCOSE    Complication of anesthesia    FIGHTING WHEN WAKING UP FROM ANESTHESIA   Constipation    Diabetes mellitus without complication (HCC)    Endometriosis    Fatty liver    Gallbladder problem    GERD (gastroesophageal reflux disease)    Heart murmur    High cholesterol    Hip pain    Joint pain    Kidney stones    Leg weakness    Orthodontics    top front 3 teeth caps and bridge   Prediabetes    RA (rheumatoid arthritis) (HCC)    SOB (shortness of breath)    Stomach ulcer    Swelling    Vitamin D deficiency    Past Surgical History:  Procedure Laterality Date   ABDOMINAL HYSTERECTOMY  age 76 ANKLE SURGERY     CHOLECYSTECTOMY N/A 04/10/2015   Procedure: LAPAROSCOPIC CHOLECYSTECTOMY WITH INTRAOPERATIVE CHOLANGIOGRAM;  Surgeon: JeRobert BellowMD;  Location: [facility];  Service: General;  Laterality: N/A;   COLONOSCOPY WITH PROPOFOL N/A 07/10/2017   Procedure: COLONOSCOPY WITH PROPOFOL;  Surgeon: AnJonathon BellowsMD;  [facility];   Service: Gastroenterology;  Laterality: N/A;   ESOPHAGOGASTRODUODENOSCOPY N/A 05/12/2015   Procedure: ESOPHAGOGASTRODUODENOSCOPY (EGD);  Surgeon: PaHulen LusterMD;  [facility] Service: Gastroenterology;  Laterality: N/A;   VAGINAL DELIVERY  2   Family History  Problem Relation Age of Onset   Diabetes Mother    Cancer Mother        colon and breast   Breast cancer Mother    Liver disease Mother    Cancer Father        Leukemia   Cancer Brother        colon and lung   Colon cancer Brother    Cholelithiasis Daughter    Heart disease Paternal Grandfather    Cancer Other 29       colon   Thyroid cancer Neg Hx     Allergies: Patient has no known allergies. Current Outpatient Medications on File Prior to Visit  Medication Sig Dispense Refill   acetaminophen (TYLENOL) 650 MG CR tablet Take 650 mg by mouth every 8 (eight) hours as needed for pain.     buPROPion (WELLBUTRIN XL) 150 MG 24 hr tablet Take 1 tablet (150 mg total) by mouth daily. 90 tablet 2   calcium carbonate (OSCAL) 1500 (600 Ca) MG  TABS tablet Take 600 mg by mouth daily.     cholecalciferol (VITAMIN D3) 25 MCG (1000 UNIT) tablet Take 2,000 Units by mouth daily.     colesevelam (WELCHOL) 625 MG tablet Take 625 mg by mouth daily as needed.     cyanocobalamin (,VITAMIN B-12,) 1000 MCG/ML injection INJECT 1 ML ONCE PER MONTH 1 mL 15   esomeprazole (NEXIUM) 40 MG capsule Take 40 mg by mouth daily as needed.     metolazone (ZAROXOLYN) 5 MG tablet Take 5 mg by mouth daily.     ondansetron (ZOFRAN-ODT) 4 MG disintegrating tablet Take 1 tablet (4 mg total) by mouth every 8 (eight) hours as needed. 20 tablet 0   potassium chloride (KLOR-CON) 10 MEQ tablet Take 1 tablet by mouth twice daily. **Take with Metolazone.** 180 tablet 0   rOPINIRole (REQUIP) 0.25 MG tablet TAKE 1 TABLET(0.25 MG) BY MOUTH AT BEDTIME AS NEEDED 90 tablet 0   rosuvastatin (CRESTOR) 10 MG tablet Take 1 tablet (10 mg total) by mouth daily.  (Patient taking differently: Take 10 mg by mouth 3 (three) times a week.) 90 tablet 3   Semaglutide, 1 MG/DOSE, 4 MG/3ML SOPN Inject 1 mg as directed once a week. 3 mL 0   sennosides-docusate sodium (SENOKOT-S) 8.6-50 MG tablet Take 1 tablet by mouth as needed.     torsemide (DEMADEX) 20 MG tablet Take 20 mg by mouth daily as needed.     Vitamin E 400 units TABS Take 400 Units by mouth daily.     tiZANidine (ZANAFLEX) 2 MG tablet TAKE 1 TO 2 TABLETS(2 TO 4 MG) BY MOUTH EVERY 8 HOURS AS NEEDED FOR MUSCLE SPASMS (Patient not taking: Reported on 02/10/2021) 60 tablet 1   No current facility-administered medications on file prior to visit.    Social History   Tobacco Use   Smoking status: Never   Smokeless tobacco: Never  Vaping Use   Vaping Use: Never used  Substance Use Topics   Alcohol use: No   Drug use: No    Review of Systems  Constitutional:  Negative for chills and fever.  Respiratory:  Negative for cough and shortness of breath.   Cardiovascular:  Negative for chest pain, palpitations and leg swelling.  Gastrointestinal:  Negative for nausea and vomiting.     Objective:    BP 122/72 (BP Location: Left Arm, Patient Position: Sitting, Cuff Size: Large)    Pulse 87    Temp 98.2 F (36.8 C) (Oral)    Ht 5' 1"  (1.549 m)    Wt 203 lb (92.1 kg)    SpO2 98%    BMI 38.36 kg/m  BP Readings from Last 3 Encounters:  02/10/21 122/72  01/26/21 116/75  12/29/20 106/69   Wt Readings from Last 3 Encounters:  02/10/21 203 lb (92.1 kg)  01/26/21 195 lb (88.5 kg)  12/29/20 193 lb (87.5 kg)    Physical Exam Vitals reviewed.  Constitutional:      Appearance: She is well-developed.  Eyes:     Conjunctiva/sclera: Conjunctivae normal.  Cardiovascular:     Rate and Rhythm: Normal rate and regular rhythm.     Pulses: Normal pulses.     Heart sounds: Normal heart sounds.  Pulmonary:     Effort: Pulmonary effort is normal.     Breath sounds: Normal breath sounds. No wheezing, rhonchi  or rales.  Musculoskeletal:     Right lower leg: No edema.     Left lower leg: No edema.  Skin:  General: Skin is warm and dry.  Neurological:     Mental Status: She is alert.  Psychiatric:        Speech: Speech normal.        Behavior: Behavior normal.        Thought Content: Thought content normal.       Assessment & Plan:   Problem List Items Addressed This Visit       Other   Chronic midline low back pain without sciatica    Chronic, overall stable and well-controlled.  Counseled on the use of Tylenol arthritis and scheduling this as an effective way to manage pain.  She will continue tramadol 50 mg as needed.  I advised against the use of diclofenac due to prior h/o GIB and she will confirm she is taking this.      Relevant Medications   traMADol (ULTRAM) 50 MG tablet   Hyperlipidemia - Primary    Uncontrolled.  Taking rosuvastatin 10 mg 3 days/week.  Pending lipid panel today.      Relevant Orders   Lipid panel   Basic metabolic panel   Leg swelling    Well controlled at this time.  No edema on exam today.  She will continue to use metolazone 5 mg PRN or torsemide 20 mg PRN, with potassium 10 mg PRN. Will follow      Spinal stenosis, lumbar region, with neurogenic claudication   Relevant Medications   traMADol (ULTRAM) 50 MG tablet   Other Visit Diagnoses     Abnormal urine       Relevant Orders   Microalbumin / creatinine urine ratio        I have discontinued Yuktha M. Moro's gabapentin. I am also having her maintain her esomeprazole, colesevelam, cholecalciferol, calcium carbonate, acetaminophen, potassium chloride, torsemide, cyanocobalamin, rOPINIRole, metolazone, Vitamin E, sennosides-docusate sodium, tiZANidine, Semaglutide (1 MG/DOSE), rosuvastatin, buPROPion, ondansetron, and traMADol.   Meds ordered this encounter  Medications   traMADol (ULTRAM) 50 MG tablet    Sig: Take 1 tablet (50 mg total) by mouth daily as needed.    Dispense:  30  tablet    Refill:  1    Order Specific Question:   Supervising Provider    Answer:   Crecencio Mc [[id number]]    Return precautions given.   Risks, benefits, and alternatives of the medications and treatment plan prescribed today were discussed, and patient expressed understanding.   Education regarding symptom management and diagnosis given to patient on AVS.  Continue to follow with Burnard Hawthorne, FNP for routine health maintenance.   [name] and I agreed with plan.   Mable Paris, FNP

## 2021-02-10 NOTE — Assessment & Plan Note (Signed)
Well controlled at this time.  No edema on exam today.  She will continue to use metolazone 5 mg PRN or torsemide 20 mg PRN, with potassium 10 mg PRN. Will follow

## 2021-02-10 NOTE — Patient Instructions (Addendum)
We recommend you get the updated bivalent COVID-19 booster, at least 2 months after any prior doses. You may consider delaying a booster dose by 3 months from a prior episode of COVID-19 per the CDC.   You can find pharmacies that have this formulation in stock at [email or web address]   Please confirm if you are on diclofenac I would advise taking tylenol.  As discussed, let's start by scheduling Tylenol Arthritis which is a 663m tablet .   You may take 1-2 tablets every 8 hours ( scheduled) with maximum of 6 tablets per day.   For example , you could take two tablets in the morning ( 8am) and then two tablets again at 4pm.

## 2021-02-10 NOTE — Assessment & Plan Note (Signed)
Chronic, overall stable and well-controlled.  Counseled on the use of Tylenol arthritis and scheduling this as an effective way to manage pain.  She will continue tramadol 50 mg as needed.  I advised against the use of diclofenac due to prior h/o GIB and she will confirm she is taking this.

## 2021-02-10 NOTE — Assessment & Plan Note (Signed)
Uncontrolled.  Taking rosuvastatin 10 mg 3 days/week.  Pending lipid panel today.

## 2021-02-18 ENCOUNTER — Telehealth: Payer: Self-pay

## 2021-02-18 NOTE — Chronic Care Management (AMB) (Signed)
°  Care Management   Note  02/18/2021 Name: Morgan Hurst MRN: [id number] DOB: 1946/01/07  Morgan Hurst is a 76 y.o. year old female who is a primary care patient of Burnard Hawthorne, FNP and is actively engaged with the care management team. I reached out to [name] by phone today to assist with re-scheduling a follow up visit with the Pharmacist  Follow up plan: Unsuccessful telephone outreach attempt made. A HIPAA compliant phone message was left for the patient providing contact information and requesting a return call.  The care management team will reach out to the patient again over the next 7 days.  If patient returns call to provider office, please advise to call [facility]  at [place], [place], [place], [place] [address] Direct Dial: [phone] [name] Website: [email or web address]

## 2021-02-22 ENCOUNTER — Encounter (INDEPENDENT_AMBULATORY_CARE_PROVIDER_SITE_OTHER): Payer: Self-pay | Admitting: Family Medicine

## 2021-02-22 ENCOUNTER — Other Ambulatory Visit: Payer: Self-pay

## 2021-02-22 ENCOUNTER — Ambulatory Visit (INDEPENDENT_AMBULATORY_CARE_PROVIDER_SITE_OTHER): Payer: Medicare Other | Admitting: Family Medicine

## 2021-02-22 VITALS — BP 113/70 | HR 82 | Temp 98.2°F | Ht 61.0 in | Wt 195.0 lb

## 2021-02-22 DIAGNOSIS — Z7985 Long-term (current) use of injectable non-insulin antidiabetic drugs: Secondary | ICD-10-CM

## 2021-02-22 DIAGNOSIS — E1169 Type 2 diabetes mellitus with other specified complication: Secondary | ICD-10-CM

## 2021-02-22 DIAGNOSIS — G8929 Other chronic pain: Secondary | ICD-10-CM | POA: Diagnosis not present

## 2021-02-22 DIAGNOSIS — E7849 Other hyperlipidemia: Secondary | ICD-10-CM

## 2021-02-22 DIAGNOSIS — E669 Obesity, unspecified: Secondary | ICD-10-CM | POA: Diagnosis not present

## 2021-02-22 DIAGNOSIS — Z6841 Body Mass Index (BMI) 40.0 and over, adult: Secondary | ICD-10-CM

## 2021-02-22 DIAGNOSIS — Z6837 Body mass index (BMI) 37.0-37.9, adult: Secondary | ICD-10-CM

## 2021-02-23 ENCOUNTER — Telehealth: Payer: Self-pay | Admitting: Family

## 2021-02-23 NOTE — Telephone Encounter (Signed)
LM that I was unsure the reason for the call as I had not tried to call patient. I let her know that I had not gotten in Morgan Hurst but I would call when I did. I advised that she call me back if needed.

## 2021-02-23 NOTE — Telephone Encounter (Signed)
Pt called in stating that she received a phone call and she thought it was for her medication pickup for ozempic

## 2021-02-24 NOTE — Progress Notes (Signed)
Chief Complaint:   OBESITY Morgan Hurst is here to discuss her progress with her obesity treatment plan along with follow-up of her obesity related diagnoses. See Medical Weight Management Flowsheet for complete bioelectrical impedance results.  Today's visit was #: 22 Starting weight: 213 lbs Starting date: 06/30/2019 Weight change since last visit: 0 Total lbs lost to date: 18 lbs Total weight loss percentage to date: -8.45%  Nutrition Plan: Lower carbohydrate, vegetable and lean protein rich diet plan for 20% of the time. Activity: Increased activity. Anti-obesity medications: Ozempic 1 mg subcutaneously weekly. Reported side effects: None.  Interim History: Morgan Hurst reports myalgias with daily statin, so she is taking Crestor 10 mg 3 times per week.  Her labs from PCP visit were reviewed.  She is still taking Ozempic 1 mg weekly.  Endorses hyperphagia.  She is waiting to pick up the 2 mg dose.  Assessment/Plan:   1. Other hyperlipidemia Course: Not at goal. Lipid-lowering medications: Crestor 10 mg 3 times per week.  Her previous cardiologist was [facility] at [facility].   Plan: Dietary changes: Increase soluble fiber, decrease simple carbohydrates, decrease saturated fat. Exercise changes: Moderate to vigorous-intensity aerobic activity 150 minutes per week or as tolerated. We will continue to monitor along with PCP/specialists as it pertains to her weight loss journey.  Refer back to Dr. Saralyn Pilar to discuss alternatives for LDL reduction.  Lab Results  Component Value Date   CHOL 206 (H) 02/10/2021   HDL 49.40 02/10/2021   LDLCALC 131 (H) 02/10/2021   LDLDIRECT 177.7 12/25/2012   TRIG 124.0 02/10/2021   CHOLHDL 4 02/10/2021   Lab Results  Component Value Date   ALT 12 12/24/2020   AST 15 12/24/2020   ALKPHOS 71 12/24/2020   BILITOT 0.3 12/24/2020   The 10-year ASCVD risk score (Arnett DK, et al., 2019) is: 21.2%   Values used to calculate the score:     Age: 76  years     Sex: Female     Is Non-Hispanic African American: No     Diabetic: Yes     Tobacco smoker: No     Systolic Blood Pressure: 732 mmHg     Is BP treated: No     HDL Cholesterol: 49.4 mg/dL     Total Cholesterol: 206 mg/dL  2. Type 2 diabetes mellitus with other specified complication, without long-term current use of insulin (HCC) Diabetes Mellitus: At goal. Medication: Ozempic 1 mg subcutaneously weekly. Issues reviewed: blood sugar goals, complications of diabetes mellitus, hypoglycemia prevention and treatment, exercise, and nutrition.  Plan: The patient will continue to focus on protein-rich, low simple carbohydrate foods. We reviewed the importance of hydration, regular exercise for stress reduction, and restorative sleep. She is waiting to get a call to pick up Ozempic 2 mg dose.  Lab Results  Component Value Date   HGBA1C 5.3 10/30/2020   HGBA1C 5.5 07/14/2020   HGBA1C 5.2 09/25/2019   Lab Results  Component Value Date   MICROALBUR 1.2 02/10/2021   LDLCALC 131 (H) 02/10/2021   CREATININE 0.72 02/10/2021   3. Chronic pain of multiple sites She says that diclofenac 75 mg twice daily as needed is helping.  Plan:  Continue diclofenac.  Will refill today.  - Refill diclofenac (VOLTAREN) 75 MG EC tablet; Take 1 tablet (75 mg total) by mouth 2 (two) times daily.  Dispense: 60 tablet; Refill: 3  4. Obesity with current BMI 37  Course: Morgan Hurst is currently in the action stage  of change. As such, her goal is to continue with weight loss efforts.   Nutrition goals: She has agreed to following a lower carbohydrate, vegetable and lean protein rich diet plan.   Exercise goals:  As is.  Behavioral modification strategies: increasing lean protein intake, decreasing simple carbohydrates, increasing vegetables, and increasing water intake.  Morgan Hurst has agreed to follow-up with our clinic in 4 weeks. She was informed of the importance of frequent follow-up visits to maximize her  success with intensive lifestyle modifications for her multiple health conditions.   Objective:   Blood pressure 113/70, pulse 82, temperature 98.2 F (36.8 C), temperature source Oral, height 5' 1"  (1.549 m), weight 195 lb (88.5 kg), SpO2 97 %. Body mass index is 36.84 kg/m.  General: Cooperative, alert, well developed, in no acute distress. HEENT: Conjunctivae and lids unremarkable. Cardiovascular: Regular rhythm.  Lungs: Normal work of breathing. Neurologic: No focal deficits.   Lab Results  Component Value Date   CREATININE 0.72 02/10/2021   BUN 13 02/10/2021   NA 141 02/10/2021   K 4.0 02/10/2021   CL 105 02/10/2021   CO2 30 02/10/2021   Lab Results  Component Value Date   ALT 12 12/24/2020   AST 15 12/24/2020   ALKPHOS 71 12/24/2020   BILITOT 0.3 12/24/2020   Lab Results  Component Value Date   HGBA1C 5.3 10/30/2020   HGBA1C 5.5 07/14/2020   HGBA1C 5.2 09/25/2019   HGBA1C 5.9 05/15/2019   HGBA1C 6.0 09/04/2018   Lab Results  Component Value Date   INSULIN 20.9 06/20/2019   Lab Results  Component Value Date   TSH 3.060 06/20/2019   Lab Results  Component Value Date   CHOL 206 (H) 02/10/2021   HDL 49.40 02/10/2021   LDLCALC 131 (H) 02/10/2021   LDLDIRECT 177.7 12/25/2012   TRIG 124.0 02/10/2021   CHOLHDL 4 02/10/2021   Lab Results  Component Value Date   VD25OH 35.49 09/04/2018   VD25OH 27.53 (L) 04/02/2018   VD25OH 25.42 (L) 12/25/2017   Lab Results  Component Value Date   WBC 7.8 06/20/2019   HGB 14.9 06/20/2019   HCT 44.5 06/20/2019   MCV 89 06/20/2019   PLT 189 06/20/2019   Lab Results  Component Value Date   IRON 59 09/07/2018   FERRITIN 87.1 09/07/2018   Attestation Statements:   Reviewed by clinician on day of visit: allergies, medications, problem list, medical history, surgical history, family history, social history, and previous encounter notes.  I, Water quality scientist, CMA, am acting as transcriptionist for Briscoe Deutscher, DO  I  have reviewed the above documentation for accuracy and completeness, and I agree with the above. -  Briscoe Deutscher, DO, MS, FAAFP, DABOM - Family and Bariatric Medicine.

## 2021-02-25 ENCOUNTER — Telehealth: Payer: Self-pay

## 2021-02-25 NOTE — Telephone Encounter (Signed)
I called LM that to patient that we did receive her patient assistance medication Ozempic 49m 1x3 mL 4 boxes. I asked that she call back to let me know that she received this message.

## 2021-02-25 NOTE — Telephone Encounter (Signed)
Noted  

## 2021-02-25 NOTE — Telephone Encounter (Signed)
Pt called in and said she will be in at 9:30 tomorrow for medication

## 2021-02-26 NOTE — Telephone Encounter (Signed)
Patient came in today to pick up Ozempic from patient assistance at 9:55 am.

## 2021-03-03 DIAGNOSIS — H4713 Papilledema associated with retinal disorder: Secondary | ICD-10-CM | POA: Diagnosis not present

## 2021-03-04 DIAGNOSIS — Z791 Long term (current) use of non-steroidal anti-inflammatories (NSAID): Secondary | ICD-10-CM | POA: Diagnosis not present

## 2021-03-04 DIAGNOSIS — I5030 Unspecified diastolic (congestive) heart failure: Secondary | ICD-10-CM | POA: Diagnosis not present

## 2021-03-04 DIAGNOSIS — H538 Other visual disturbances: Secondary | ICD-10-CM | POA: Diagnosis not present

## 2021-03-04 DIAGNOSIS — R9082 White matter disease, unspecified: Secondary | ICD-10-CM | POA: Diagnosis not present

## 2021-03-04 DIAGNOSIS — H547 Unspecified visual loss: Secondary | ICD-10-CM | POA: Diagnosis not present

## 2021-03-04 DIAGNOSIS — R519 Headache, unspecified: Secondary | ICD-10-CM | POA: Diagnosis not present

## 2021-03-04 DIAGNOSIS — H543 Unqualified visual loss, both eyes: Secondary | ICD-10-CM | POA: Diagnosis not present

## 2021-03-04 DIAGNOSIS — E785 Hyperlipidemia, unspecified: Secondary | ICD-10-CM | POA: Diagnosis not present

## 2021-03-04 DIAGNOSIS — Z7982 Long term (current) use of aspirin: Secondary | ICD-10-CM | POA: Diagnosis not present

## 2021-03-04 DIAGNOSIS — H539 Unspecified visual disturbance: Secondary | ICD-10-CM | POA: Diagnosis not present

## 2021-03-04 DIAGNOSIS — M1990 Unspecified osteoarthritis, unspecified site: Secondary | ICD-10-CM | POA: Diagnosis not present

## 2021-03-04 DIAGNOSIS — E119 Type 2 diabetes mellitus without complications: Secondary | ICD-10-CM | POA: Diagnosis not present

## 2021-03-04 DIAGNOSIS — K219 Gastro-esophageal reflux disease without esophagitis: Secondary | ICD-10-CM | POA: Diagnosis not present

## 2021-03-04 DIAGNOSIS — Z79899 Other long term (current) drug therapy: Secondary | ICD-10-CM | POA: Diagnosis not present

## 2021-03-04 DIAGNOSIS — E1169 Type 2 diabetes mellitus with other specified complication: Secondary | ICD-10-CM | POA: Diagnosis not present

## 2021-03-04 DIAGNOSIS — H47393 Other disorders of optic disc, bilateral: Secondary | ICD-10-CM | POA: Diagnosis not present

## 2021-03-04 DIAGNOSIS — H3589 Other specified retinal disorders: Secondary | ICD-10-CM | POA: Diagnosis not present

## 2021-03-05 DIAGNOSIS — H539 Unspecified visual disturbance: Secondary | ICD-10-CM | POA: Diagnosis not present

## 2021-03-15 NOTE — Chronic Care Management (AMB) (Signed)
°  Care Management   Note  03/15/2021 Name: Morgan Hurst MRN: [id number] DOB: 11/15/1945  Morgan Hurst is a 76 y.o. year old female who is a primary care patient of Burnard Hawthorne, FNP and is actively engaged with the care management team. I reached out to [name] by phone today to assist with re-scheduling a follow up visit with the Pharmacist  Follow up plan: Telephone appointment with care management team member scheduled for:03/25/2021  Noreene Larsson, [place], [place], [place] [address] Direct Dial: [phone] [email or web address] Website: [email or web address]

## 2021-03-16 ENCOUNTER — Telehealth: Payer: Medicare Other

## 2021-03-17 ENCOUNTER — Other Ambulatory Visit: Payer: Self-pay

## 2021-03-17 ENCOUNTER — Ambulatory Visit
Admission: RE | Admit: 2021-03-17 | Discharge: 2021-03-17 | Disposition: A | Discharge status: Home / Self Care | Payer: Medicare Other | Source: Ambulatory Visit | Attending: Family | Admitting: Family

## 2021-03-17 DIAGNOSIS — Z1231 Encounter for screening mammogram for malignant neoplasm of breast: Secondary | ICD-10-CM | POA: Insufficient documentation

## 2021-03-19 ENCOUNTER — Telehealth: Payer: Self-pay

## 2021-03-19 NOTE — Chronic Care Management (AMB) (Signed)
?  Care Management  ? ?Note ? ?03/19/2021 ?Name: NOHEA KRAS MRN: [id number] DOB: 1945-07-03 ? ?KLAUDIA BEIRNE is a 76 y.o. year old female who is a primary care patient of Burnard Hawthorne, FNP and is actively engaged with the care management team. I reached out to [name] by phone today to assist with re-scheduling a follow up visit with the Pharmacist ? ?Follow up plan: ?Unsuccessful telephone outreach attempt made. A HIPAA compliant phone message was left for the patient providing contact information and requesting a return call.  ?The care management team will reach out to the patient again over the next 7 days.  ?If patient returns call to provider office, please advise to call [facility]  at [phone] ? ?Noreene Larsson, RMA ?Care Guide, Embedded Care Coordination ?[facility]  Care Management  ?[place], [place] [address] ?Direct Dial: [phone] ?[email or web address] ?Website: [email or web address]  ? ?

## 2021-03-24 ENCOUNTER — Ambulatory Visit (INDEPENDENT_AMBULATORY_CARE_PROVIDER_SITE_OTHER): Payer: Medicare Other | Admitting: Family Medicine

## 2021-03-24 NOTE — Chronic Care Management (AMB) (Signed)
?  Care Management  ? ?Note ? ?03/24/2021 ?Name: Morgan Hurst MRN: [id number] DOB: 1945-02-01 ? ?Morgan Hurst is a 76 y.o. year old female who is a primary care patient of Burnard Hawthorne, FNP and is actively engaged with the care management team. I reached out to [name] by phone today to assist with scheduling an initial visit with the RN Case Manager ? ?Follow up plan: ?Patient declines further follow up and engagement by the care management team. Appropriate care team members and provider have been notified via electronic communication.  ? ?Noreene Larsson, RMA ?Care Guide, Embedded Care Coordination ?[facility]  Care Management  ?[place], [place] [address] ?Direct Dial: [phone] ?[email or web address] ?Website: [email or web address]  ? ?

## 2021-03-25 ENCOUNTER — Ambulatory Visit: Payer: Self-pay | Admitting: Pharmacist

## 2021-03-25 ENCOUNTER — Telehealth: Payer: Medicare Other

## 2021-03-25 DIAGNOSIS — H4713 Papilledema associated with retinal disorder: Secondary | ICD-10-CM | POA: Diagnosis not present

## 2021-03-25 DIAGNOSIS — H2513 Age-related nuclear cataract, bilateral: Secondary | ICD-10-CM | POA: Diagnosis not present

## 2021-03-25 NOTE — Chronic Care Management (AMB) (Signed)
?  Chronic Care Management  ? ?Note ? ?03/25/2021 ?Name: Morgan Hurst MRN: [id number] DOB: 12/18/1945 ? ? ? ?Closing pharmacy CCM case at this time.  Patient has clinic contact information for future questions or concerns.  ? ?Catie Darnelle Maffucci, PharmD, [place], CPP ?Clinical Pharmacist ?Therapist, music at [employer] ?[phone] ? ?

## 2021-03-31 DIAGNOSIS — R0602 Shortness of breath: Secondary | ICD-10-CM | POA: Diagnosis not present

## 2021-03-31 DIAGNOSIS — E785 Hyperlipidemia, unspecified: Secondary | ICD-10-CM | POA: Diagnosis not present

## 2021-03-31 DIAGNOSIS — I429 Cardiomyopathy, unspecified: Secondary | ICD-10-CM | POA: Diagnosis not present

## 2021-03-31 DIAGNOSIS — I517 Cardiomegaly: Secondary | ICD-10-CM | POA: Diagnosis not present

## 2021-03-31 DIAGNOSIS — I509 Heart failure, unspecified: Secondary | ICD-10-CM | POA: Diagnosis not present

## 2021-04-27 ENCOUNTER — Ambulatory Visit: Payer: Medicare Other | Admitting: Family Medicine

## 2021-04-27 NOTE — Progress Notes (Deleted)
? ?  I, [name], LAT, ATC, am serving as scribe for Dr. Lynne Leader. ? ?Morgan Hurst is a 76 y.o. female who presents to [facility] today for L shoulder and arm pain.  She was last seen by Dr. [name] on 10/23/20 for R hip GT bursitis.  Today, pt reports L shoulder pain x .  She locates her pain to . ? ?Radiating pain: ?L shoulder mechanical symptoms: ?Aggravating factors: ?Treatments tried: ? ?Diagnostic testing: C-spine MRI- 04/12/20; C-spine XR- 03/20/20 ? ?Pertinent review of systems: *** ? ?Relevant historical information: *** ? ? ?Exam:  ?There were no vitals taken for this visit. ?General: Well Developed, well nourished, and in no acute distress.  ? ?MSK: *** ? ? ? ?Lab and Radiology Results ?No results found for this or any previous visit (from the past 72 hour(s)). ?No results found. ? ? ? ? ?Assessment and Plan: ?76 y.o. female with *** ? ? ?PDMP not reviewed this encounter. ?No orders of the defined types were placed in this encounter. ? ?No orders of the defined types were placed in this encounter. ? ? ? ?Discussed warning signs or symptoms. Please see discharge instructions. Patient expresses understanding. ? ? ?*** ? ?

## 2021-04-28 ENCOUNTER — Ambulatory Visit (INDEPENDENT_AMBULATORY_CARE_PROVIDER_SITE_OTHER): Payer: Medicare Other | Admitting: Physician Assistant

## 2021-04-28 ENCOUNTER — Ambulatory Visit: Payer: Medicare Other | Admitting: Family Medicine

## 2021-04-30 DIAGNOSIS — J020 Streptococcal pharyngitis: Secondary | ICD-10-CM | POA: Diagnosis not present

## 2021-05-06 ENCOUNTER — Ambulatory Visit: Payer: Medicare Other | Admitting: Dermatology

## 2021-05-06 DIAGNOSIS — L570 Actinic keratosis: Secondary | ICD-10-CM

## 2021-05-06 DIAGNOSIS — L578 Other skin changes due to chronic exposure to nonionizing radiation: Secondary | ICD-10-CM | POA: Diagnosis not present

## 2021-05-06 DIAGNOSIS — L821 Other seborrheic keratosis: Secondary | ICD-10-CM

## 2021-05-06 DIAGNOSIS — L820 Inflamed seborrheic keratosis: Secondary | ICD-10-CM

## 2021-05-06 MED ORDER — FLUOROURACIL 5 % EX CREA: TOPICAL_CREAM | Freq: Two times a day (BID) | CUTANEOUS | 0 refills | Status: DC

## 2021-05-06 NOTE — Patient Instructions (Addendum)
Cryotherapy Aftercare ? ?Wash gently with soap and water everyday.   ?Apply Vaseline and Band-Aid daily until healed.  ? ? ?Seborrheic Keratosis ? ?What causes seborrheic keratoses? ?Seborrheic keratoses are harmless, common skin growths that first appear during adult life.  As time goes by, more growths appear.  Some people may develop a large number of them.  Seborrheic keratoses appear on both covered and uncovered body parts.  They are not caused by sunlight.  The tendency to develop seborrheic keratoses can be inherited.  They vary in color from skin-colored to gray, brown, or even black.  They can be either smooth or have a rough, warty surface.   ?Seborrheic keratoses are superficial and look as if they were stuck on the skin.  Under the microscope this type of keratosis looks like layers upon layers of skin.  That is why at times the top layer may seem to fall off, but the rest of the growth remains and re-grows.   ? ?Treatment ?Seborrheic keratoses do not need to be treated, but can easily be removed in the office.  Seborrheic keratoses often cause symptoms when they rub on clothing or jewelry.  Lesions can be in the way of shaving.  If they become inflamed, they can cause itching, soreness, or burning.  Removal of a seborrheic keratosis can be accomplished by freezing, burning, or surgery. ?If any spot bleeds, scabs, or grows rapidly, please return to have it checked, as these can be an indication of a skin cancer. ? ?Recommend daily broad spectrum sunscreen SPF 30+ to sun-exposed areas, reapply every 2 hours as needed. Call for new or changing lesions.  ?Staying in the shade or wearing long sleeves, sun glasses (UVA+UVB protection) and wide brim hats (4-inch brim around the entire circumference of the hat) are also recommended for sun protection.  ? ?If You Need Anything After Your Visit ? ?If you have any questions or concerns for your doctor, please call our main line at [phone] and press option 4  to reach your doctor's medical assistant. If no one answers, please leave a voicemail as directed and we will return your call as soon as possible. Messages left after 4 pm will be answered the following business day.  ? ?You may also send [place] a message via MyChart. We typically respond to MyChart messages within 1-2 business days. ? ?For prescription refills, please ask your pharmacy to contact our office. Our fax number is [phone]. ? ?If you have an urgent issue when the clinic is closed that cannot wait until the next business day, you can page your doctor at the number below.   ? ?Please note that while we do our best to be available for urgent issues outside of office hours, we are not available 24/7.  ? ?If you have an urgent issue and are unable to reach [place], you may choose to seek medical care at your doctor's office, retail clinic, urgent care center, or emergency room. ? ?If you have a medical emergency, please immediately call 911 or go to the emergency department. ? ?Pager Numbers ? ?- Dr. Nehemiah Massed: [phone] ? ?- Dr. Laurence Ferrari: [phone] ? ?- Dr. Nicole Kindred: [phone] ? ?In the event of inclement weather, please call our main line at [phone] for an update on the status of any delays or closures. ? ?Dermatology Medication Tips: ?Please keep the boxes that topical medications come in in order to help keep track of the instructions about where and how to use these. Pharmacies  typically print the medication instructions only on the boxes and not directly on the medication tubes.  ? ?If your medication is too expensive, please contact our office at [phone] option 4 or send [place] a message through [place].  ? ?We are unable to tell what your co-pay for medications will be in advance as this is different depending on your insurance coverage. However, we may be able to find a substitute medication at lower cost or fill out paperwork to get insurance to cover a needed medication.  ? ?If a prior  authorization is required to get your medication covered by your insurance company, please allow [place] 1-2 business days to complete this process. ? ?Drug prices often vary depending on where the prescription is filled and some pharmacies may offer cheaper prices. ? ?The website www.goodrx.com contains coupons for medications through different pharmacies. The prices here do not account for what the cost may be with help from insurance (it may be cheaper with your insurance), but the website can give you the price if you did not use any insurance.  ?- You can print the associated coupon and take it with your prescription to the pharmacy.  ?- You may also stop by our office during regular business hours and pick up a GoodRx coupon card.  ?- If you need your prescription sent electronically to a different pharmacy, notify our office through [facility] or by phone at [phone] option 4. ? ? ? ? ?Si Usted Necesita Algo Despu?s de Su Visita ? ?Tambi?n puede enviarnos un mensaje a trav?s de MyChart. Por lo general respondemos a los mensajes de MyChart en el transcurso de 1 a 2 d?as h?biles. ? ?Para renovar recetas, por favor pida a su farmacia que se ponga en contacto con nuestra oficina. Nuestro n?mero de fax es el [phone]. ? ?Si tiene un asunto urgente cuando la cl?nica est? cerrada y que no puede esperar hasta el siguiente d?a h?bil, puede llamar/localizar a su doctor(a) al n?mero que aparece a continuaci?n.  ? ?Por favor, tenga en cuenta que aunque hacemos todo lo posible para estar disponibles para asuntos urgentes fuera del horario de oficina, no estamos disponibles las 24 horas del d?a, los 7 d?as de la semana.  ? ?Si tiene un problema urgente y no puede comunicarse con nosotros, puede optar por buscar atenci?n m?dica  en el consultorio de su doctor(a), en una cl?nica privada, en un centro de atenci?n urgente o en una sala de emergencias. ? ?Si tiene Engineer, maintenance (IT) m?dica, por favor llame  inmediatamente al 911 o vaya a la sala de emergencias. ? ?N?meros de b?per ? ?- Dr. Nehemiah Massed: [phone] ? ?- Dra. Moye: [phone] ? ?- Dra. Nicole Kindred: [phone] ? ?En caso de inclemencias del tiempo, por favor llame a nuestra l?nea principal al [phone] para una actualizaci?n sobre el estado de cualquier retraso o cierre. ? ?Consejos para la medicaci?n en dermatolog?a: ?Por favor, guarde las cajas en las que vienen los medicamentos de uso t?pico para ayudarle a seguir las instrucciones sobre d?nde y c?mo usarlos. Las farmacias generalmente imprimen las instrucciones del medicamento s?lo en las cajas y no directamente en los tubos del [place].  ? ?Si su medicamento es muy caro, por favor, p?ngase en contacto con Zigmund Daniel llamando al [phone] y presione la opci?n 4 o env?enos un mensaje a trav?s de MyChart.  ? ?No podemos decirle cu?l ser? su copago por los medicamentos por adelantado ya que esto es diferente dependiendo de la [place] de  su seguro. Sin embargo, es posible que podamos encontrar un medicamento sustituto a Electrical engineer un formulario para que el seguro cubra el medicamento que se considera necesario.  ? ?Si se requiere Ardelia Mems autorizaci?n previa para que su compa??a de seguros [place] su medicamento, por favor perm?tanos de 1 a 2 d?as h?biles para completar este proceso. ? ?Los precios de los medicamentos var?an con frecuencia dependiendo del Environmental consultant de d?nde se surte la receta y alguna farmacias pueden ofrecer precios m?s baratos. ? ?El sitio web www.goodrx.com tiene cupones para medicamentos de Airline pilot. Los precios aqu? no tienen en cuenta lo que podr?a costar con la ayuda del seguro (puede ser m?s barato con su seguro), pero el sitio web puede darle el precio si no utiliz? ning?n seguro.  ?- Puede imprimir el cup?n correspondiente y llevarlo con su receta a la farmacia.  ?- Tambi?n puede pasar por nuestra oficina durante el horario de atenci?n regular y recoger  una tarjeta de cupones de GoodRx.  ?- Si necesita que su receta se env?e electr?nicamente a Chiropodist, informe a nuestra oficina a trav?s de MyChart de [place] o por tel?fono llamando al [phone] y

## 2021-05-06 NOTE — Progress Notes (Signed)
? ?New Patient Visit ? ?Subjective  ?Morgan Hurst is a 76 y.o. female who presents for the following: Skin Problem (Patient here today for a spot at nose, present for a few years. The spot scales up but won't go away. Does not bleed, is not tender. She also has a dry flaky spot at left cheek, one at right arm that won't go away and feels like warts.). ?The patient has spots, moles and lesions to be evaluated, some may be new or changing and the patient has concerns that these could be cancer. ? ?The following portions of the chart were reviewed this encounter and updated as appropriate:  ? Tobacco  Allergies  Meds  Problems  Med Hx  Surg Hx  Fam Hx   ?  ?Review of Systems:  No other skin or systemic complaints except as noted in HPI or Assessment and Plan. ? ?Objective  ?Well appearing patient in no apparent distress; mood and affect are within normal limits. ? ?A focused examination was performed including arms, hands, face. Relevant physical exam findings are noted in the Assessment and Plan. ? ?right of midline dorsum nose x 1, left cheek x 1 (2) ?Erythematous thin papules/macules with gritty scale.  ? ?bilateral arms (7) ?Erythematous stuck-on, waxy papule or plaque ? ? ?Assessment & Plan  ?AK (actinic keratosis) (2) ?right of midline dorsum nose x 1, left cheek x 1 ? ?Destruction of lesion - right of midline dorsum nose x 1, left cheek x 1 ?Complexity: simple   ?Destruction method: cryotherapy   ?Informed consent: discussed and consent obtained   ?Timeout:  patient name, date of birth, surgical site, and procedure verified ?Lesion destroyed using liquid nitrogen: Yes   ?Region frozen until ice ball extended beyond lesion: Yes   ?Outcome: patient tolerated procedure well with no complications   ?Post-procedure details: wound care instructions given   ? ?fluorouracil (EFUDEX) 5 % cream - right of midline dorsum nose x 1, left cheek x 1 ?Apply topically 2 (two) times daily. For 1 week to affected areas  at nose and left cheek ? ?Inflamed seborrheic keratosis (7) ?bilateral arms ? ?Destruction of lesion - bilateral arms ?Complexity: simple   ?Destruction method: cryotherapy   ?Informed consent: discussed and consent obtained   ?Timeout:  patient name, date of birth, surgical site, and procedure verified ?Lesion destroyed using liquid nitrogen: Yes   ?Region frozen until ice ball extended beyond lesion: Yes   ?Outcome: patient tolerated procedure well with no complications   ?Post-procedure details: wound care instructions given   ? ?Seborrheic Keratoses ?- Stuck-on, waxy, tan-brown papules and/or plaques  ?- Benign-appearing ?- Discussed benign etiology and prognosis. ?- Observe ?- Call for any changes ? ?Actinic Damage - Severe, confluent actinic changes with pre-cancerous actinic keratoses  ?- Severe, chronic, not at goal, secondary to cumulative UV radiation exposure over time ?- diffuse scaly erythematous macules and papules with underlying dyspigmentation ?- Discussed Prescription "Field Treatment" for Severe, Chronic Confluent Actinic Changes with Pre-Cancerous Actinic Keratoses ?Field treatment involves treatment of an entire area of skin that has confluent Actinic Changes (Sun/ Ultraviolet light damage) and PreCancerous Actinic Keratoses by method of PhotoDynamic Therapy (PDT) and/or prescription Topical Chemotherapy agents such as 5-fluorouracil, 5-fluorouracil/calcipotriene, and/or imiquimod.  The purpose is to decrease the number of clinically evident and subclinical PreCancerous lesions to prevent progression to development of skin cancer by chemically destroying early precancer changes that may or may not be visible.  It has been shown to  reduce the risk of developing skin cancer in the treated area. As a result of treatment, redness, scaling, crusting, and open sores may occur during treatment course. One or more than one of these methods may be used and may have to be used several times to control,  suppress and eliminate the PreCancerous changes. Discussed treatment course, expected reaction, and possible side effects. ?- Recommend daily broad spectrum sunscreen SPF 30+ to sun-exposed areas, reapply every 2 hours as needed.  ?- Staying in the shade or wearing long sleeves, sun glasses (UVA+UVB protection) and wide brim hats (4-inch brim around the entire circumference of the hat) are also recommended. ?- Call for new or changing lesions. ? ?Start 5-fluorouracil/calcipotriene cream twice a day for 7 days to affected areas including nose, left cheek. Prescription sent to [facility]. Patient provided with contact information for pharmacy and advised the pharmacy will mail the prescription to their home. Patient provided with handout reviewing treatment course and side effects and advised to call or message [place] on MyChart with any concerns. ? ?Return in about 3 months (around 08/05/2021) for AK follow up, ISK follow up. ? ?Graciella Belton, RMA, am acting as scribe for Sarina Ser, MD . ?Documentation: I have reviewed the above documentation for accuracy and completeness, and I agree with the above. ? ?Sarina Ser, MD ? ? ?

## 2021-05-12 ENCOUNTER — Encounter: Payer: Self-pay | Admitting: Family

## 2021-05-12 ENCOUNTER — Ambulatory Visit (INDEPENDENT_AMBULATORY_CARE_PROVIDER_SITE_OTHER): Payer: Medicare Other | Admitting: Family

## 2021-05-12 VITALS — BP 128/68 | HR 80 | Temp 98.0°F | Ht 61.0 in | Wt 199.6 lb

## 2021-05-12 DIAGNOSIS — G8929 Other chronic pain: Secondary | ICD-10-CM | POA: Diagnosis not present

## 2021-05-12 DIAGNOSIS — M545 Low back pain, unspecified: Secondary | ICD-10-CM

## 2021-05-12 DIAGNOSIS — H539 Unspecified visual disturbance: Secondary | ICD-10-CM

## 2021-05-12 DIAGNOSIS — M7989 Other specified soft tissue disorders: Secondary | ICD-10-CM

## 2021-05-12 DIAGNOSIS — E785 Hyperlipidemia, unspecified: Secondary | ICD-10-CM | POA: Diagnosis not present

## 2021-05-12 MED ORDER — EZETIMIBE 10 MG PO TABS: 10.0000 mg | ORAL_TABLET | Freq: Every day | ORAL | 3 refills | Status: DC

## 2021-05-12 NOTE — Assessment & Plan Note (Signed)
Resolved. Reviewed unc ed course, Dr Estill Bakes consult. Suspected GCA. She will maintain close follow up with Dr Estill Bakes. ?

## 2021-05-12 NOTE — Patient Instructions (Addendum)
Start zetia 64m in addition to rosuvastatin 173m? ?Nice to see you! ? ? ?

## 2021-05-12 NOTE — Assessment & Plan Note (Signed)
Chronic, well controlled. Continue metolazone 5 mg PRN or torsemide 20 mg PRN, with potassium 10 mg PRN ?

## 2021-05-12 NOTE — Assessment & Plan Note (Signed)
LDL > 100. Trial of zetia 94m in addition to crestor 163mthree days/week.  ?

## 2021-05-12 NOTE — Progress Notes (Signed)
? ?Subjective:  ? ? Patient ID: Morgan Hurst, female    DOB: 06/05/1945, 76 y.o.   MRN: [id number] ? ?CC: Morgan Hurst is a 76 y.o. female who presents today for follow up.  ? ?HPI: Feels well today ?Left eye swelling resolved. Vision improved in left eye as she is also wearing prescription glasses. Vision is back to baseline.  ? ? ?[facility] ED 03/04/21 for vision changes.  ?Bilateral disc edema concerning for increased intracranial pressure and possible mass or other underlying intracranial process. Ophthalmology recommended MRI brain MRI venogram which showed subtle fluid within bilateral optic nerve sheaths. ?LP performed ?ESR 70 ?Given 107m solu medrol prior to discharge.  ?She was discharged on 678mdaily prednisone for one week to cover for GCA.  ?Follow up with Morgan Hurst ?[facility] with near resolution of optic nerve edema. Follow up in 2 months ? ?Follow up with Morgan Morgan Hurst Half/15/23 to re establish care for CHF. Follow up in 6 months ? ?Chronic low back pain- compliant with tramadol 5068mrn usually twice per week.  ? ?HLD- compliant with crestor 3 days/week ?Tried pravastatin 43m54mipitor 20mg6milateral leg swelling- controlled. She continues to use metolazone 5 mg PRN or torsemide 20 mg PRN, with potassium 10 mg PRN ? ?HISTORY:  ?Past Medical History:  ?Diagnosis Date  ? Arthritis   ? lower back, right hip  ? B12 deficiency   ? Back pain   ? Borderline diabetes   ? PCP STARTED PT ON METFORMIN IN 2016 DUE TO ELEVATED GLUCOSE   ? Complication of anesthesia   ? FIGHTING WHEN WAKING UP FROM ANESTHESIA  ? Constipation   ? Diabetes mellitus without complication (HCC) [place] Endometriosis   ? Fatty liver   ? Gallbladder problem   ? GERD (gastroesophageal reflux disease)   ? Heart murmur   ? High cholesterol   ? Hip pain   ? Joint pain   ? Kidney stones   ? Leg weakness   ? Orthodontics   ? top front 3 teeth caps and bridge  ? Prediabetes   ? RA (rheumatoid arthritis)  (HCC) [place] SOB (shortness of breath)   ? Stomach ulcer   ? Swelling   ? Vitamin D deficiency   ? ?Past Surgical History:  ?Procedure Laterality Date  ? ABDOMINAL HYSTERECTOMY  age 32  ?49NKLE SURGERY    ? CHOLECYSTECTOMY N/A 04/10/2015  ? Procedure: LAPAROSCOPIC CHOLECYSTECTOMY WITH INTRAOPERATIVE CHOLANGIOGRAM;  Surgeon: JeffrRobert Bellow  Location: [facility] ORS;  Service: General;  Laterality: N/A;  ? COLONOSCOPY WITH PROPOFOL N/A 07/10/2017  ? Procedure: COLONOSCOPY WITH PROPOFOL;  Surgeon: Anna,Jonathon Bellows  [facility];  Service: Gastroenterology;  Laterality: N/A;  ? ESOPHAGOGASTRODUODENOSCOPY N/A 05/12/2015  ? Procedure: ESOPHAGOGASTRODUODENOSCOPY (EGD);  Surgeon: Paul Hulen Luster  [facility];  Laterality: N/A;  ? VAGINAL DELIVERY  2  ? ?Family History  ?Problem Relation Age of Onset  ? Diabetes Mother   ? Cancer Mother   ?     colon and breast  ? Breast cancer Mother   ? Liver disease Mother   ? Cancer Father   ?     Leukemia  ? Cancer Brother   ?     colon and lung  ? Colon cancer Brother   ? Cholelithiasis Daughter   ? Heart disease Paternal Grandfather   ? Cancer Other 29  ?  colon  ? Thyroid cancer Neg Hx   ? ? ?Allergies: Patient has no known allergies. ?Current Outpatient Medications on File Prior to Visit  ?Medication Sig Dispense Refill  ? acetaminophen (TYLENOL) 650 MG CR tablet Take 650 mg by mouth every 8 (eight) hours as needed for pain.    ? buPROPion (WELLBUTRIN XL) 150 MG 24 hr tablet Take 1 tablet (150 mg total) by mouth daily. 90 tablet 2  ? calcium carbonate (OSCAL) 1500 (600 Ca) MG TABS tablet Take 600 mg by mouth daily.    ? cholecalciferol (VITAMIN D3) 25 MCG (1000 UNIT) tablet Take 2,000 Units by mouth daily.    ? colesevelam (WELCHOL) 625 MG tablet Take 625 mg by mouth daily as needed.    ? cyanocobalamin (,VITAMIN B-12,) 1000 MCG/ML injection INJECT 1 ML ONCE PER MONTH 1 mL 15  ? diclofenac (VOLTAREN) 75 MG EC tablet Take 1  tablet (75 mg total) by mouth 2 (two) times daily. 60 tablet 3  ? esomeprazole (NEXIUM) 40 MG capsule Take 40 mg by mouth daily as needed.    ? fluorouracil (EFUDEX) 5 % cream Apply topically 2 (two) times daily. For 1 week to affected areas at nose and left cheek 15 g 0  ? metolazone (ZAROXOLYN) 5 MG tablet Take 5 mg by mouth daily.    ? ondansetron (ZOFRAN-ODT) 4 MG disintegrating tablet Take 1 tablet (4 mg total) by mouth every 8 (eight) hours as needed. 20 tablet 0  ? potassium chloride (KLOR-CON) 10 MEQ tablet Take 1 tablet by mouth twice daily. **Take with Metolazone.** 180 tablet 0  ? rOPINIRole (REQUIP) 0.25 MG tablet TAKE 1 TABLET(0.25 MG) BY MOUTH AT BEDTIME AS NEEDED 90 tablet 0  ? rosuvastatin (CRESTOR) 10 MG tablet Take 1 tablet (10 mg total) by mouth daily. (Patient taking differently: Take 10 mg by mouth 3 (three) times a week.) 90 tablet 3  ? Semaglutide, 1 MG/DOSE, 4 MG/3ML SOPN Inject 1 mg as directed once a week. 3 mL 0  ? sennosides-docusate sodium (SENOKOT-S) 8.6-50 MG tablet Take 1 tablet by mouth as needed.    ? tiZANidine (ZANAFLEX) 2 MG tablet TAKE 1 TO 2 TABLETS(2 TO 4 MG) BY MOUTH EVERY 8 HOURS AS NEEDED FOR MUSCLE SPASMS 60 tablet 1  ? torsemide (DEMADEX) 20 MG tablet Take 20 mg by mouth daily as needed.    ? traMADol (ULTRAM) 50 MG tablet Take 1 tablet (50 mg total) by mouth daily as needed. 30 tablet 1  ? Vitamin E 400 units TABS Take 400 Units by mouth daily.    ? ?No current facility-administered medications on file prior to visit.  ? ? ?Social History  ? ?Tobacco Use  ? Smoking status: Never  ? Smokeless tobacco: Never  ?Vaping Use  ? Vaping Use: Never used  ?Substance Use Topics  ? Alcohol use: No  ? Drug use: No  ? ? ?Review of Systems  ?Constitutional:  Negative for chills and fever.  ?Respiratory:  Negative for cough.   ?Cardiovascular:  Negative for chest pain and palpitations.  ?Gastrointestinal:  Negative for nausea and vomiting.  ?Musculoskeletal:  Positive for back pain.  ?    ?Objective:  ?  ?BP 128/68 (BP Location: Left Arm, Patient Position: Sitting, Cuff Size: Normal)   Pulse 80   Temp 98 ?F (36.7 ?C) (Oral)   Ht 5' 1"  (1.549 m)   Wt 199 lb 9.6 oz (90.5 kg)   SpO2 98%   BMI 37.71 kg/m?  ?  BP Readings from Last 3 Encounters:  ?05/12/21 128/68  ?02/22/21 113/70  ?02/10/21 122/72  ? ?Wt Readings from Last 3 Encounters:  ?05/12/21 199 lb 9.6 oz (90.5 kg)  ?02/22/21 195 lb (88.5 kg)  ?02/10/21 203 lb (92.1 kg)  ? ? ?Physical Exam ?Vitals reviewed.  ?Constitutional:   ?   Appearance: She is well-developed.  ?Eyes:  ?   Conjunctiva/sclera: Conjunctivae normal.  ?Cardiovascular:  ?   Rate and Rhythm: Normal rate and regular rhythm.  ?   Pulses: Normal pulses.  ?   Heart sounds: Normal heart sounds.  ?Pulmonary:  ?   Effort: Pulmonary effort is normal.  ?   Breath sounds: Normal breath sounds. No wheezing, rhonchi or rales.  ?Musculoskeletal:  ?   Right lower leg: No edema.  ?   Left lower leg: No edema.  ?Skin: ?   General: Skin is warm and dry.  ?Neurological:  ?   Mental Status: She is alert.  ?Psychiatric:     ?   Speech: Speech normal.     ?   Behavior: Behavior normal.     ?   Thought Content: Thought content normal.  ? ? ?   ?Assessment & Plan:  ? ?Problem List Items Addressed This Visit   ? ?  ? Other  ? Changes in vision  ?  Resolved. Reviewed unc ed course, Morgan Estill Bakes consult. Suspected GCA. She will maintain close follow up with Morgan Estill Bakes. ? ?  ?  ? Chronic midline low back pain without sciatica  ?  Chronic, stable. Continue tramadol 23m prn usually twice per week.  ? ?  ?  ? Hyperlipidemia - Primary  ?  LDL > 100. Trial of zetia 123min addition to crestor 1035mhree days/week.  ? ?  ?  ? Relevant Medications  ? ezetimibe (ZETIA) 10 MG tablet  ? Leg swelling  ?  Chronic, well controlled. Continue metolazone 5 mg PRN or torsemide 20 mg PRN, with potassium 10 mg PRN ? ?  ?  ? ? ? ?I am having BetCongerart on ezetimibe. I am also having her maintain her esomeprazole,  colesevelam, cholecalciferol, calcium carbonate, acetaminophen, potassium chloride, torsemide, cyanocobalamin, rOPINIRole, metolazone, Vitamin E, sennosides-docusate sodium, tiZANidine, Semaglutide (1 MG/DOSE), r

## 2021-05-12 NOTE — Assessment & Plan Note (Signed)
Chronic, stable. Continue tramadol 8m prn usually twice per week.  ?

## 2021-05-15 ENCOUNTER — Encounter: Payer: Self-pay | Admitting: Dermatology

## 2021-05-17 DIAGNOSIS — E785 Hyperlipidemia, unspecified: Secondary | ICD-10-CM | POA: Diagnosis not present

## 2021-05-17 DIAGNOSIS — G8929 Other chronic pain: Secondary | ICD-10-CM | POA: Diagnosis not present

## 2021-05-17 DIAGNOSIS — E1169 Type 2 diabetes mellitus with other specified complication: Secondary | ICD-10-CM | POA: Diagnosis not present

## 2021-05-20 ENCOUNTER — Telehealth: Payer: Self-pay

## 2021-05-20 NOTE — Telephone Encounter (Signed)
LMTCB to office to let patient know that her Ozempic from patient assistance was here and ready to be picked up. ?

## 2021-05-21 NOTE — Telephone Encounter (Signed)
Pt returning call... Advised pt of below note... Pt stated that she is going to have her daughter Morgan Hurst to pickup medication for her...  ?

## 2021-06-18 ENCOUNTER — Ambulatory Visit (INDEPENDENT_AMBULATORY_CARE_PROVIDER_SITE_OTHER): Payer: Medicare Other | Admitting: Family

## 2021-06-18 ENCOUNTER — Encounter: Payer: Self-pay | Admitting: Family

## 2021-06-18 VITALS — BP 130/78 | HR 89 | Temp 97.7°F | Ht 61.0 in | Wt 199.6 lb

## 2021-06-18 DIAGNOSIS — J40 Bronchitis, not specified as acute or chronic: Secondary | ICD-10-CM

## 2021-06-18 DIAGNOSIS — R051 Acute cough: Secondary | ICD-10-CM

## 2021-06-18 MED ORDER — BENZONATATE 100 MG PO CAPS: 100.0000 mg | ORAL_CAPSULE | Freq: Three times a day (TID) | ORAL | 1 refills | Status: DC | PRN

## 2021-06-18 MED ORDER — PREDNISONE 10 MG PO TABS: ORAL_TABLET | ORAL | 0 refills | Status: DC

## 2021-06-18 NOTE — Assessment & Plan Note (Addendum)
Reassuring exam today.  No acute respiratory distress.  Patient is afebrile.  Fortunately COVID flu and strep are negative.  Advised patient in the setting of sneezing and acute onset after mowing grass, suspect this could be viral versus allergic in etiology.  Start prednisone taper, tessalon Perles and claritin.   Counseled on side effect of prednisone and how to take medication.  she will let me know how she is doing.

## 2021-06-18 NOTE — Patient Instructions (Signed)
Thank goodness COVID , strep and and flu test are negative.  I suspect a viral or allergic etiology.  I sent in prednisone 40 taper for you to start.  I would advise you to start this tomorrow morning and make sure you get all doses and before 2 PM each day as prednisone can interfere with your ability to fall asleep.  Tessalon Perles can be used for cough as needed.    Please let me know of  any new concerns and if you do not get better.

## 2021-06-18 NOTE — Progress Notes (Signed)
Subjective:    Patient ID: Morgan Hurst, female    DOB: November 10, 1945, 76 y.o.   MRN: [id number]  CC: Morgan Hurst is a 76 y.o. female who presents today for an acute visit.    HPI: Complains of dry cough and sore throat x 1 day, unchanged Endorses sneezing, hoarseness, right ear itching, mild 'dull' HA behind behind both eyes No cp, fever, sob, leg swelling, myalgia.  She mowed the yard yesterday; no known allergies She tried tylenol with some relief.  Never smoker No h/o lung disease   HISTORY:  Past Medical History:  Diagnosis Date   Arthritis    lower back, right hip   B12 deficiency    Back pain    Borderline diabetes    PCP STARTED PT ON METFORMIN IN 2016 DUE TO ELEVATED GLUCOSE    Complication of anesthesia    FIGHTING WHEN WAKING UP FROM ANESTHESIA   Constipation    Diabetes mellitus without complication (HCC)    Endometriosis    Fatty liver    Gallbladder problem    GERD (gastroesophageal reflux disease)    Heart murmur    High cholesterol    Hip pain    Joint pain    Kidney stones    Leg weakness    Orthodontics    top front 3 teeth caps and bridge   Prediabetes    RA (rheumatoid arthritis) (HCC)    SOB (shortness of breath)    Stomach ulcer    Swelling    Vitamin D deficiency    Past Surgical History:  Procedure Laterality Date   ABDOMINAL HYSTERECTOMY  age 25   ANKLE SURGERY     CHOLECYSTECTOMY N/A 04/10/2015   Procedure: LAPAROSCOPIC CHOLECYSTECTOMY WITH INTRAOPERATIVE CHOLANGIOGRAM;  Surgeon: Morgan Bellow, MD;  Location: [facility] ORS;  Service: General;  Laterality: N/A;   COLONOSCOPY WITH PROPOFOL N/A 07/10/2017   Procedure: COLONOSCOPY WITH PROPOFOL;  Surgeon: Morgan Bellows, MD;  Location: [facility] ENDOSCOPY;  Service: Gastroenterology;  Laterality: N/A;   ESOPHAGOGASTRODUODENOSCOPY N/A 05/12/2015   Procedure: ESOPHAGOGASTRODUODENOSCOPY (EGD);  Surgeon: Morgan Luster, MD;  Location: [place];  Service: Gastroenterology;  Laterality: N/A;    VAGINAL DELIVERY  2   Family History  Problem Relation Age of Onset   Diabetes Mother    Cancer Mother        colon and breast   Breast cancer Mother    Liver disease Mother    Cancer Father        Leukemia   Cancer Brother        colon and lung   Colon cancer Brother    Cholelithiasis Daughter    Heart disease Paternal Grandfather    Cancer Other 29       colon   Thyroid cancer Neg Hx     Allergies: Patient has no known allergies. Current Outpatient Medications on File Prior to Visit  Medication Sig Dispense Refill   acetaminophen (TYLENOL) 650 MG CR tablet Take 650 mg by mouth every 8 (eight) hours as needed for pain.     buPROPion (WELLBUTRIN XL) 150 MG 24 hr tablet Take 1 tablet (150 mg total) by mouth daily. 90 tablet 2   calcium carbonate (OSCAL) 1500 (600 Ca) MG TABS tablet Take 600 mg by mouth daily.     cholecalciferol (VITAMIN D3) 25 MCG (1000 UNIT) tablet Take 2,000 Units by mouth daily.     colesevelam (WELCHOL) 625 MG tablet Take 625 mg by  mouth daily as needed. (Patient not taking: Reported on 06/18/2021)     cyanocobalamin (,VITAMIN B-12,) 1000 MCG/ML injection INJECT 1 ML ONCE PER MONTH 1 mL 15   diclofenac (VOLTAREN) 75 MG EC tablet Take 1 tablet (75 mg total) by mouth 2 (two) times daily. 60 tablet 3   esomeprazole (NEXIUM) 40 MG capsule Take 40 mg by mouth daily as needed.     ezetimibe (ZETIA) 10 MG tablet Take 1 tablet (10 mg total) by mouth daily. 90 tablet 3   fluorouracil (EFUDEX) 5 % cream Apply topically 2 (two) times daily. For 1 week to affected areas at nose and left cheek (Patient not taking: Reported on 06/18/2021) 15 g 0   metolazone (ZAROXOLYN) 5 MG tablet Take 5 mg by mouth daily.     ondansetron (ZOFRAN-ODT) 4 MG disintegrating tablet Take 1 tablet (4 mg total) by mouth every 8 (eight) hours as needed. 20 tablet 0   potassium chloride (KLOR-CON) 10 MEQ tablet Take 1 tablet by mouth twice daily. **Take with Metolazone.** 180 tablet 0   rOPINIRole  (REQUIP) 0.25 MG tablet TAKE 1 TABLET(0.25 MG) BY MOUTH AT BEDTIME AS NEEDED 90 tablet 0   rosuvastatin (CRESTOR) 10 MG tablet Take 1 tablet (10 mg total) by mouth daily. (Patient taking differently: Take 10 mg by mouth 3 (three) times a week.) 90 tablet 3   Semaglutide, 1 MG/DOSE, 4 MG/3ML SOPN Inject 1 mg as directed once a week. 3 mL 0   sennosides-docusate sodium (SENOKOT-S) 8.6-50 MG tablet Take 1 tablet by mouth as needed.     tiZANidine (ZANAFLEX) 2 MG tablet TAKE 1 TO 2 TABLETS(2 TO 4 MG) BY MOUTH EVERY 8 HOURS AS NEEDED FOR MUSCLE SPASMS (Patient not taking: Reported on 06/18/2021) 60 tablet 1   torsemide (DEMADEX) 20 MG tablet Take 20 mg by mouth daily as needed.     traMADol (ULTRAM) 50 MG tablet Take 1 tablet (50 mg total) by mouth daily as needed. 30 tablet 1   Vitamin E 400 units TABS Take 400 Units by mouth daily.     No current facility-administered medications on file prior to visit.    Social History   Tobacco Use   Smoking status: Never   Smokeless tobacco: Never  Vaping Use   Vaping Use: Never used  Substance Use Topics   Alcohol use: No   Drug use: No    Review of Systems  Constitutional:  Negative for chills and fever.  HENT:  Positive for sore throat. Negative for congestion, ear pain and sinus pain.   Respiratory:  Positive for cough. Negative for chest tightness, shortness of breath and wheezing.   Cardiovascular:  Negative for chest pain and palpitations.  Gastrointestinal:  Negative for nausea and vomiting.  Neurological:  Positive for headaches.     Objective:    BP 130/78 (BP Location: Left Arm, Patient Position: Sitting, Cuff Size: Normal)   Pulse 89   Temp 97.7 F (36.5 C) (Oral)   Ht 5' 1"  (1.549 m)   Wt 199 lb 9.6 oz (90.5 kg)   SpO2 96%   BMI 37.71 kg/m    Physical Exam Vitals reviewed.  Constitutional:      Appearance: She is well-developed.  HENT:     Head: Normocephalic and atraumatic.     Right Ear: Hearing, tympanic membrane, ear  canal and external ear normal. No decreased hearing noted. No drainage, swelling or tenderness. No middle ear effusion. No foreign body. Tympanic membrane is  not erythematous or bulging.     Left Ear: Hearing, tympanic membrane, ear canal and external ear normal. No decreased hearing noted. No drainage, swelling or tenderness.  No middle ear effusion. No foreign body. Tympanic membrane is not erythematous or bulging.     Nose: Nose normal. No rhinorrhea.     Right Sinus: No maxillary sinus tenderness or frontal sinus tenderness.     Left Sinus: No maxillary sinus tenderness or frontal sinus tenderness.     Mouth/Throat:     Pharynx: Uvula midline. No oropharyngeal exudate or posterior oropharyngeal erythema.     Tonsils: No tonsillar abscesses.  Eyes:     Conjunctiva/sclera: Conjunctivae normal.  Cardiovascular:     Rate and Rhythm: Regular rhythm.     Pulses: Normal pulses.     Heart sounds: Normal heart sounds.  Pulmonary:     Effort: Pulmonary effort is normal.     Breath sounds: Normal breath sounds. No wheezing, rhonchi or rales.  Lymphadenopathy:     Head:     Right side of head: No submental, submandibular, tonsillar, preauricular, posterior auricular or occipital adenopathy.     Left side of head: No submental, submandibular, tonsillar, preauricular, posterior auricular or occipital adenopathy.     Cervical: No cervical adenopathy.  Skin:    General: Skin is warm and dry.  Neurological:     Mental Status: She is alert.  Psychiatric:        Speech: Speech normal.        Behavior: Behavior normal.        Thought Content: Thought content normal.       Assessment & Plan:   Problem List Items Addressed This Visit       Respiratory   Bronchitis    Reassuring exam today.  No acute respiratory distress.  Patient is afebrile.  Fortunately COVID flu and strep are negative.  Advised patient in the setting of sneezing and acute onset after mowing grass, suspect this could be viral  versus allergic in etiology.  Start prednisone taper, tessalon Perles and claritin.   Counseled on side effect of prednisone and how to take medication.  she will let me know how she is doing.         Other   Cough - Primary   Relevant Medications   predniSONE (DELTASONE) 10 MG tablet   benzonatate (TESSALON) 100 MG capsule   Other Relevant Orders   POCT Influenza A/B   POCT rapid strep A   POC COVID-19      I am having [place]u [place] start on predniSONE and benzonatate. I am also having her maintain her esomeprazole, colesevelam, cholecalciferol, calcium carbonate, acetaminophen, potassium chloride, torsemide, cyanocobalamin, rOPINIRole, metolazone, Vitamin E, sennosides-docusate sodium, tiZANidine, Semaglutide (1 MG/DOSE), rosuvastatin, buPROPion, ondansetron, traMADol, diclofenac, fluorouracil, and ezetimibe.   Meds ordered this encounter  Medications   predniSONE (DELTASONE) 10 MG tablet    Sig: Take 40 mg by mouth on day 1, then taper 10 mg daily until gone    Dispense:  10 tablet    Refill:  0    Order Specific Question:   Supervising Provider    Answer:   Crecencio Mc [[id number]]   benzonatate (TESSALON) 100 MG capsule    Sig: Take 1 capsule (100 mg total) by mouth 3 (three) times daily as needed for cough.    Dispense:  20 capsule    Refill:  1    Order Specific Question:   Supervising Provider  Answer:   Crecencio Mc [[id number]]    Return precautions given.   Risks, benefits, and alternatives of the medications and treatment plan prescribed today were discussed, and patient expressed understanding.   Education regarding symptom management and diagnosis given to patient on AVS.  Continue to follow with Burnard Hawthorne, FNP for routine health maintenance.   [name] and I agreed with plan.   Mable Paris, FNP

## 2021-06-18 NOTE — Progress Notes (Signed)
OCTRUNNING NOSE

## 2021-06-20 ENCOUNTER — Encounter (HOSPITAL_COMMUNITY): Payer: Self-pay | Admitting: Emergency Medicine

## 2021-06-20 ENCOUNTER — Ambulatory Visit (INDEPENDENT_AMBULATORY_CARE_PROVIDER_SITE_OTHER): Payer: Medicare Other

## 2021-06-20 ENCOUNTER — Ambulatory Visit (HOSPITAL_COMMUNITY)
Admission: EM | Admit: 2021-06-20 | Discharge: 2021-06-20 | Disposition: A | Discharge status: Home / Self Care | Payer: Medicare Other | Attending: Family Medicine | Admitting: Family Medicine

## 2021-06-20 DIAGNOSIS — R062 Wheezing: Secondary | ICD-10-CM | POA: Diagnosis not present

## 2021-06-20 DIAGNOSIS — J4521 Mild intermittent asthma with (acute) exacerbation: Secondary | ICD-10-CM | POA: Diagnosis not present

## 2021-06-20 DIAGNOSIS — R059 Cough, unspecified: Secondary | ICD-10-CM

## 2021-06-20 DIAGNOSIS — J069 Acute upper respiratory infection, unspecified: Secondary | ICD-10-CM

## 2021-06-20 LAB — POCT INFLUENZA A/B
Influenza A, POC: NEGATIVE
Influenza B, POC: NEGATIVE

## 2021-06-20 LAB — POCT RAPID STREP A (OFFICE): Rapid Strep A Screen: NEGATIVE

## 2021-06-20 LAB — POC COVID19 BINAXNOW: SARS Coronavirus 2 Ag: NEGATIVE

## 2021-06-20 MED ORDER — ALBUTEROL SULFATE (2.5 MG/3ML) 0.083% IN NEBU
2.5000 mg | INHALATION_SOLUTION | Freq: Once | RESPIRATORY_TRACT | Status: AC
  Administered 2021-06-20: 2.5 mg via RESPIRATORY_TRACT

## 2021-06-20 MED ORDER — ALBUTEROL SULFATE HFA 108 (90 BASE) MCG/ACT IN AERS
2.0000 | INHALATION_SPRAY | RESPIRATORY_TRACT | 0 refills | Status: DC | PRN
Start: 2021-06-20 — End: 2021-11-17

## 2021-06-20 MED ORDER — CHERATUSSIN AC 100-10 MG/5ML PO SOLN: 5.0000 mL | Freq: Four times a day (QID) | ORAL | 0 refills | Status: DC | PRN

## 2021-06-20 MED ORDER — ALBUTEROL SULFATE HFA 108 (90 BASE) MCG/ACT IN AERS: 1.0000 | INHALATION_SPRAY | RESPIRATORY_TRACT | 0 refills | Status: DC | PRN

## 2021-06-20 MED ORDER — ALBUTEROL SULFATE HFA 108 (90 BASE) MCG/ACT IN AERS: 2.0000 | INHALATION_SPRAY | RESPIRATORY_TRACT | 0 refills | Status: DC | PRN

## 2021-06-20 MED ORDER — ALBUTEROL SULFATE (2.5 MG/3ML) 0.083% IN NEBU
INHALATION_SOLUTION | RESPIRATORY_TRACT | Status: AC
  Filled 2021-06-20: qty 3

## 2021-06-20 NOTE — Discharge Instructions (Addendum)
Your chest x-ray was clear and did not show pneumonia  You were given a breathing treatment with albuterol in it  Albuterol inhaler--do 2 puffs every 4 hours as needed for shortness of breath or wheezing  Robitussin with codeine--take 1 teaspoon or 5 mL every 6 hours as needed for cough

## 2021-06-20 NOTE — ED Triage Notes (Signed)
Pt reports started feeling bad last week. Saw PCP on Friday and given medications. Took them past 2 days but still coughing that is persistent and feels pulled something in left side yesterday. Reports when laid down heard a wheezing. Reports SOB is worse.

## 2021-06-20 NOTE — ED Provider Notes (Signed)
[facility]    CSN: [id number] Arrival date & time: 06/20/21  1744      History   Chief Complaint Chief Complaint  Patient presents with   Cough    HPI Morgan Hurst is a 76 y.o. female.    Cough Here for worsening cough and wheezing.  On May 31 she started having some scratchy throat and some cough and rhinorrhea.  It worsened and she was seen on June 2 by her primary care office.  COVID, flu, and strep were all negative.  She was prescribed prednisone, and Tessalon Perles.  She noted that the [facility] had not been helping much for the cough, and then today the cough has been worse and more persistent.  She also noticed herself wheezing today, and she has never done that before; she has never used an inhaler  No fever so far.  Her rhinorrhea is actually improved some with the medications given  She did have a sharp pain 1 time when she coughed in her left lower rib cage, but that has resolved  Past Medical History:  Diagnosis Date   Arthritis    lower back, right hip   B12 deficiency    Back pain    Borderline diabetes    PCP STARTED PT ON METFORMIN IN 2016 DUE TO ELEVATED GLUCOSE    Complication of anesthesia    FIGHTING WHEN WAKING UP FROM ANESTHESIA   Constipation    Diabetes mellitus without complication ([place])    Endometriosis    Fatty liver    Gallbladder problem    GERD (gastroesophageal reflux disease)    Heart murmur    High cholesterol    Hip pain    Joint pain    Kidney stones    Leg weakness    Orthodontics    top front 3 teeth caps and bridge   Prediabetes    RA (rheumatoid arthritis) ([place])    SOB (shortness of breath)    Stomach ulcer    Swelling    Vitamin D deficiency     Patient Active Problem List   Diagnosis Date Noted   Changes in vision 05/12/2021   Leg swelling 02/10/2021   Left knee pain 09/03/2020   Right foot pain 07/29/2020   Degenerative disc disease, cervical 06/16/2020   Constipation 05/18/2020    Aspirin long-term use 04/01/2020   Obesity 04/01/2020   Cough 01/01/2020   Dyspnea 03/13/2019   Osteoporosis 02/14/2019   Weakness of both lower extremities 08/24/2018   Greater trochanteric bursitis, left 04/24/2018   Vitamin D deficiency 04/06/2018   Bronchitis 04/06/2018   Trigger point of left shoulder region 12/26/2017   Frozen shoulder 11/14/2017   GERD (gastroesophageal reflux disease) 03/17/2017   Greater trochanteric bursitis of right hip 09/07/2016   Chronic midline low back pain without sciatica 04/25/2016   Fatty liver disease, nonalcoholic 48/27/0786   Nodule of left lung 11/25/2015   Prediabetes 08/24/2015   Muscle cramp 02/05/2015   B12 deficiency 02/05/2015   Neck pain 12/08/2014   Trapezius muscle spasm 11/20/2014   Nonallopathic lesion of thoracic region 11/20/2014   Trigger thumb 10/23/2014   Pain of right thumb 10/07/2014   Spinal stenosis, lumbar region, with neurogenic claudication 08/01/2014   Hemorrhoid 06/18/2014   Postmenopausal estrogen deficiency 03/18/2014   Skin lesion 03/18/2014   Obesity (BMI 30-39.9) 02/14/2014   Myalgia and myositis 02/14/2014   Arthralgia 12/25/2012   Pernicious anemia 12/25/2012   Memory loss 12/25/2012  Hyperlipidemia 01/25/2012    Past Surgical History:  Procedure Laterality Date   ABDOMINAL HYSTERECTOMY  age 27   ANKLE SURGERY     CHOLECYSTECTOMY N/A 04/10/2015   Procedure: LAPAROSCOPIC CHOLECYSTECTOMY WITH INTRAOPERATIVE CHOLANGIOGRAM;  Surgeon: Robert Bellow, MD;  Location: [facility] ORS;  Service: General;  Laterality: N/A;   COLONOSCOPY WITH PROPOFOL N/A 07/10/2017   Procedure: COLONOSCOPY WITH PROPOFOL;  Surgeon: Jonathon Bellows, MD;  Location: [facility] ENDOSCOPY;  Service: Gastroenterology;  Laterality: N/A;   ESOPHAGOGASTRODUODENOSCOPY N/A 05/12/2015   Procedure: ESOPHAGOGASTRODUODENOSCOPY (EGD);  Surgeon: Hulen Luster, MD;  Location: [place];  Service: Gastroenterology;  Laterality: N/A;   VAGINAL DELIVERY  2     OB History     Gravida  2   Para  2   Term  2   Preterm      AB      Living         SAB      IAB      Ectopic      Multiple      Live Births               Home Medications    Prior to Admission medications   Medication Sig Start Date End Date Taking? Authorizing Provider  albuterol (VENTOLIN HFA) 108 (90 Base) MCG/ACT inhaler Inhale 1-2 puffs into the lungs every 4 (four) hours as needed for wheezing or shortness of breath. 06/20/21  Yes Fontella Shan, Gwenlyn Perking, MD  guaiFENesin-codeine (CHERATUSSIN AC) 100-10 MG/5ML syrup Take 5 mLs by mouth 4 (four) times daily as needed for cough. 06/20/21  Yes Barrett Henle, MD  acetaminophen (TYLENOL) 650 MG CR tablet Take 650 mg by mouth every 8 (eight) hours as needed for pain.    [provider]  buPROPion (WELLBUTRIN XL) 150 MG 24 hr tablet Take 1 tablet (150 mg total) by mouth daily. 12/29/20   Briscoe Deutscher, DO  calcium carbonate (OSCAL) 1500 (600 Ca) MG TABS tablet Take 600 mg by mouth daily.    [provider]  cholecalciferol (VITAMIN D3) 25 MCG (1000 UNIT) tablet Take 2,000 Units by mouth daily.    [provider]  colesevelam (WELCHOL) 625 MG tablet Take 625 mg by mouth daily as needed. Patient not taking: Reported on 06/18/2021    [provider]  cyanocobalamin (,VITAMIN B-12,) 1000 MCG/ML injection INJECT 1 ML ONCE PER MONTH 05/04/20   Burnard Hawthorne, FNP  diclofenac (VOLTAREN) 75 MG EC tablet Take 1 tablet (75 mg total) by mouth 2 (two) times daily. 02/22/21   Briscoe Deutscher, DO  esomeprazole (NEXIUM) 40 MG capsule Take 40 mg by mouth daily as needed. 05/21/15   [provider]  ezetimibe (ZETIA) 10 MG tablet Take 1 tablet (10 mg total) by mouth daily. 05/12/21   Burnard Hawthorne, FNP  fluorouracil (EFUDEX) 5 % cream Apply topically 2 (two) times daily. For 1 week to affected areas at nose and left cheek Patient not taking: Reported on 06/18/2021 05/06/21   Ralene Bathe, MD  metolazone (ZAROXOLYN) 5 MG tablet Take 5 mg by mouth daily. 06/02/20   [provider]  ondansetron (ZOFRAN-ODT) 4 MG disintegrating tablet Take 1 tablet (4 mg total) by mouth every 8 (eight) hours as needed. 01/26/21   Briscoe Deutscher, DO  potassium chloride (KLOR-CON) 10 MEQ tablet Take 1 tablet by mouth twice daily. **Take with Metolazone.** 03/04/20   Briscoe Deutscher, DO  predniSONE (DELTASONE) 10 MG tablet Take  40 mg by mouth on day 1, then taper 10 mg daily until gone 06/18/21   Burnard Hawthorne, FNP  rOPINIRole (REQUIP) 0.25 MG tablet TAKE 1 TABLET(0.25 MG) BY MOUTH AT BEDTIME AS NEEDED 05/25/20   Burnard Hawthorne, FNP  rosuvastatin (CRESTOR) 10 MG tablet Take 1 tablet (10 mg total) by mouth daily. Patient taking differently: Take 10 mg by mouth 3 (three) times a week. 11/05/20   Burnard Hawthorne, FNP  Semaglutide, 1 MG/DOSE, 4 MG/3ML SOPN Inject 1 mg as directed once a week. 10/30/20   Burnard Hawthorne, FNP  sennosides-docusate sodium (SENOKOT-S) 8.6-50 MG tablet Take 1 tablet by mouth as needed.    [provider]  tiZANidine (ZANAFLEX) 2 MG tablet TAKE 1 TO 2 TABLETS(2 TO 4 MG) BY MOUTH EVERY 8 HOURS AS NEEDED FOR MUSCLE SPASMS Patient not taking: Reported on 06/18/2021 06/12/20   Gregor Hams, MD  torsemide (DEMADEX) 20 MG tablet Take 20 mg by mouth daily as needed.    [provider]  traMADol (ULTRAM) 50 MG tablet Take 1 tablet (50 mg total) by mouth daily as needed. 02/10/21   Burnard Hawthorne, FNP  Vitamin E 400 units TABS Take 400 Units by mouth daily.    [provider]    Family History Family History  Problem Relation Age of Onset   Diabetes Mother    Cancer Mother        colon and breast   Breast cancer Mother    Liver disease Mother    Cancer Father        Leukemia   Cancer Brother        colon and lung   Colon cancer Brother    Cholelithiasis Daughter    Heart disease Paternal Grandfather    Cancer Other 29       colon    Thyroid cancer Neg Hx     Social History Social History   Tobacco Use   Smoking status: Never   Smokeless tobacco: Never  Vaping Use   Vaping Use: Never used  Substance Use Topics   Alcohol use: No   Drug use: No     Allergies   Patient has no known allergies.   Review of Systems Review of Systems  Respiratory:  Positive for cough.     Physical Exam Triage Vital Signs ED Triage Vitals  Enc Vitals Group     BP 06/20/21 1759 (!) 153/75     Pulse Rate 06/20/21 1759 88     Resp 06/20/21 1759 (!) 24     Temp 06/20/21 1759 98.4 F (36.9 C)     Temp Source 06/20/21 1759 Oral     SpO2 06/20/21 1759 99 %     Weight --      Height --      Head Circumference --      Peak Flow --      Pain Score 06/20/21 1757 0     Pain Loc --      Pain Edu? --      Excl. in [place]? --    No data found.  Updated Vital Signs BP (!) 153/75 (BP Location: Right Arm)   Pulse 88   Temp 98.4 F (36.9 C) (Oral)   Resp (!) 24   SpO2 99%   Visual Acuity Right Eye Distance:   Left Eye Distance:   Bilateral Distance:    Right Eye Near:   Left Eye Near:  Bilateral Near:     Physical Exam Vitals reviewed.  Constitutional:      General: She is not in acute distress.    Appearance: She is not toxic-appearing.  HENT:     Right Ear: Tympanic membrane and ear canal normal.     Left Ear: Tympanic membrane and ear canal normal.     Nose: Nose normal.     Mouth/Throat:     Mouth: Mucous membranes are moist.     Comments: There is clear mucus in the oropharynx Eyes:     Extraocular Movements: Extraocular movements intact.     Conjunctiva/sclera: Conjunctivae normal.     Pupils: Pupils are equal, round, and reactive to light.  Cardiovascular:     Rate and Rhythm: Normal rate and regular rhythm.     Heart sounds: No murmur heard. Pulmonary:     Effort: Pulmonary effort is normal. No respiratory distress.     Breath sounds: No stridor. Wheezing (Bilaterally) present. No rhonchi or  rales.  Chest:     Chest wall: No tenderness.  Musculoskeletal:     Cervical back: Neck supple.     Right lower leg: No edema.     Left lower leg: No edema.  Lymphadenopathy:     Cervical: No cervical adenopathy.  Skin:    Capillary Refill: Capillary refill takes less than 2 seconds.     Coloration: Skin is not jaundiced or pale.  Neurological:     General: No focal deficit present.     Mental Status: She is alert and oriented to person, place, and time.  Psychiatric:        Behavior: Behavior normal.     UC Treatments / Results  Labs (all labs ordered are listed, but only abnormal results are displayed) Labs Reviewed - No data to display  EKG   Radiology DG Chest 2 View  Result Date: 06/20/2021 CLINICAL DATA:  Cough and wheezing. EXAM: CHEST - 2 VIEW COMPARISON:  09/07/2018 FINDINGS: The cardiomediastinal contours are normal. Bronchial thickening. Incidental azygous fissure. Pulmonary vasculature is normal. No consolidation, pleural effusion, or pneumothorax. No acute osseous abnormalities are seen. IMPRESSION: Bronchial thickening without pneumonia. Electronically Signed   By: Keith Rake M.D.   On: 06/20/2021 18:37    Procedures Procedures (including critical care time)  Medications Ordered in UC Medications  albuterol (PROVENTIL) (2.5 MG/3ML) 0.083% nebulizer solution 2.5 mg (2.5 mg Nebulization Given 06/20/21 1819)    Initial Impression / Assessment and Plan / UC Course  I have reviewed the triage vital signs and the nursing notes.  Pertinent labs & imaging results that were available during my care of the patient were reviewed by me and considered in my medical decision making (see chart for details).     Chest x-ray does not show infiltrate.  We will treat with an inhaler and codeine cough syrup.  She is okay on PMP and last filled some tramadol in mid April. Final Clinical Impressions(s) / UC Diagnoses   Final diagnoses:  Viral URI with cough  Mild  intermittent asthma with acute exacerbation     Discharge Instructions      Your chest x-ray was clear and did not show pneumonia  You were given a breathing treatment with albuterol in it  Albuterol inhaler--do 2 puffs every 4 hours as needed for shortness of breath or wheezing  Robitussin with codeine--take 1 teaspoon or 5 mL every 6 hours as needed for cough     ED Prescriptions  Medication Sig Dispense Auth. Provider   albuterol (VENTOLIN HFA) 108 (90 Base) MCG/ACT inhaler Inhale 1-2 puffs into the lungs every 4 (four) hours as needed for wheezing or shortness of breath. 1 each Barrett Henle, MD   guaiFENesin-codeine (CHERATUSSIN AC) 100-10 MG/5ML syrup Take 5 mLs by mouth 4 (four) times daily as needed for cough. 120 mL Barrett Henle, MD      I have reviewed the PDMP during this encounter.   Barrett Henle, MD 06/20/21 [phone]

## 2021-07-27 DIAGNOSIS — E119 Type 2 diabetes mellitus without complications: Secondary | ICD-10-CM | POA: Diagnosis not present

## 2021-07-27 DIAGNOSIS — E785 Hyperlipidemia, unspecified: Secondary | ICD-10-CM | POA: Diagnosis not present

## 2021-08-05 ENCOUNTER — Ambulatory Visit: Payer: Medicare Other | Admitting: Dermatology

## 2021-08-07 IMAGING — MG MM DIGITAL DIAGNOSTIC UNILAT*L* W/ TOMO W/ CAD
6 of 10 series · 6 of 30 positions shown · non-contrast
Comparison: Previous exam(s).

CLINICAL DATA: Screening recall for possible left breast
distortion.

EXAM:
DIGITAL DIAGNOSTIC UNILATERAL LEFT MAMMOGRAM WITH CAD AND TOMO

[L CC synth-2D (1 of 4)]
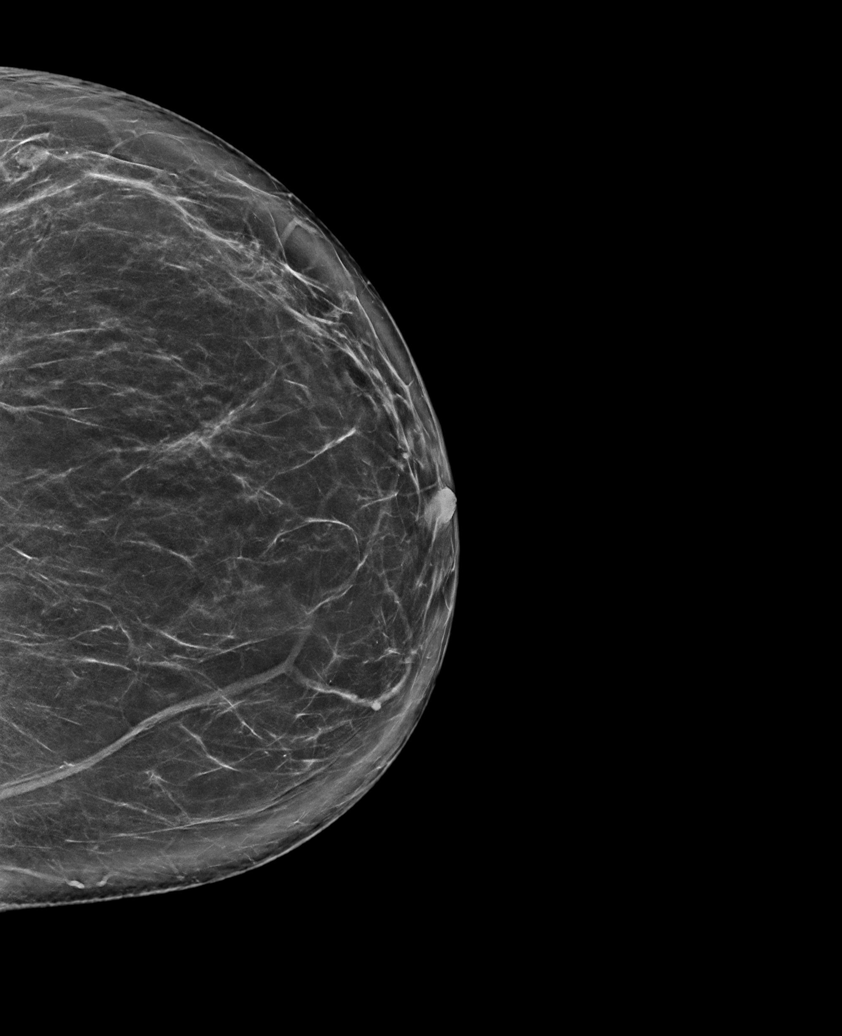

[L CC synth-2D (2 of 4)]
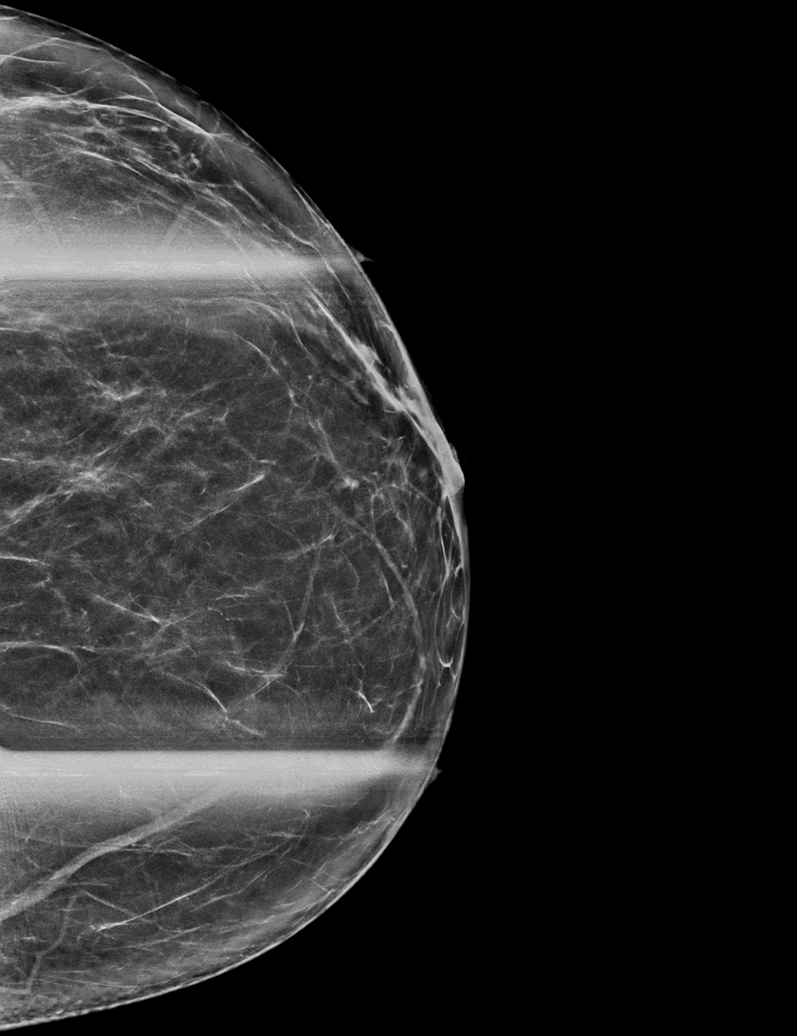

[L ML synth-2D]
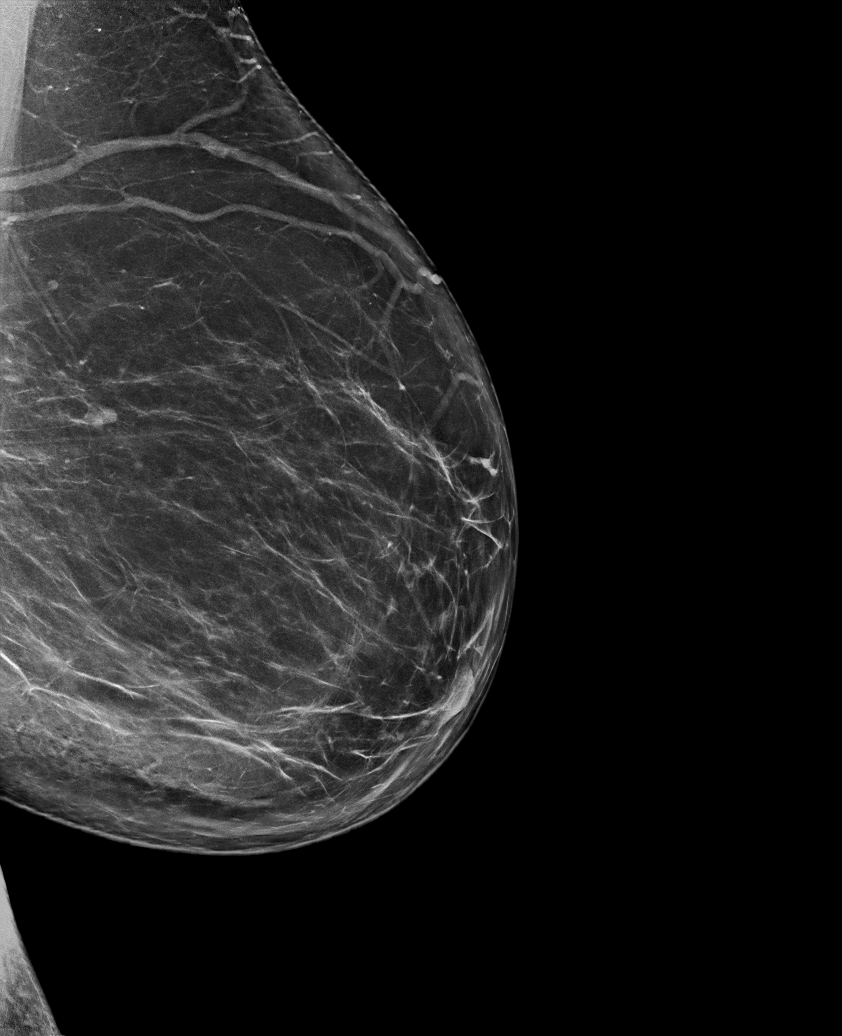

[L CC synth-2D (3 of 4)]
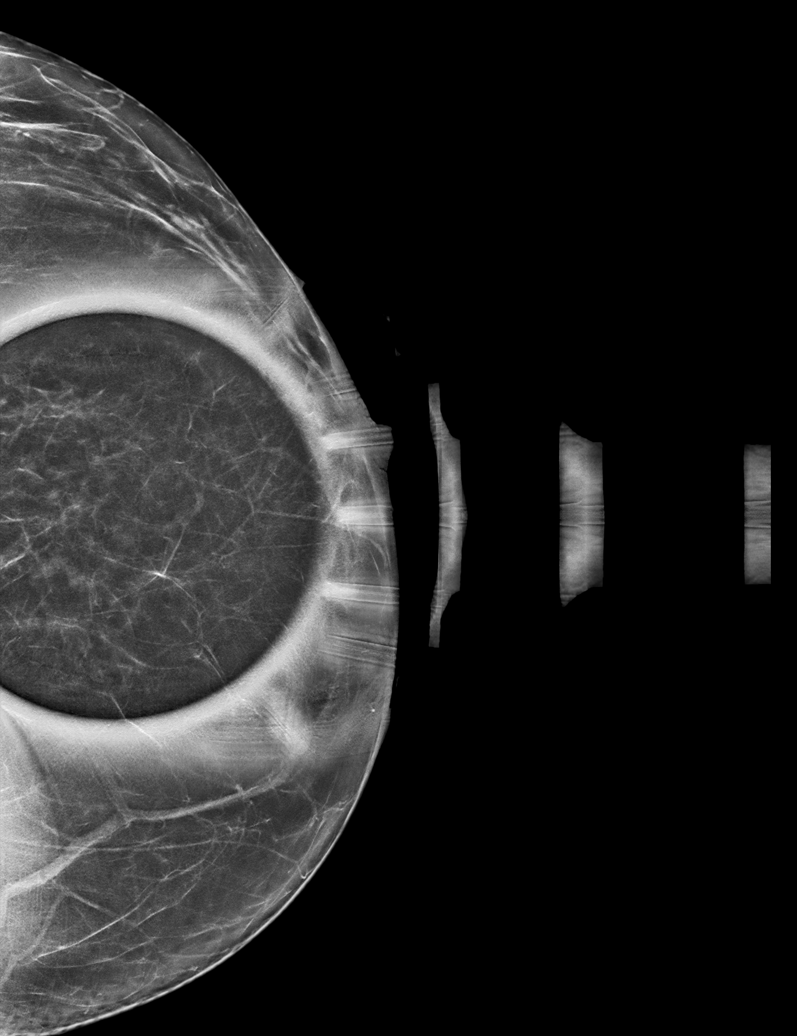

[L CC synth-2D (4 of 4)]
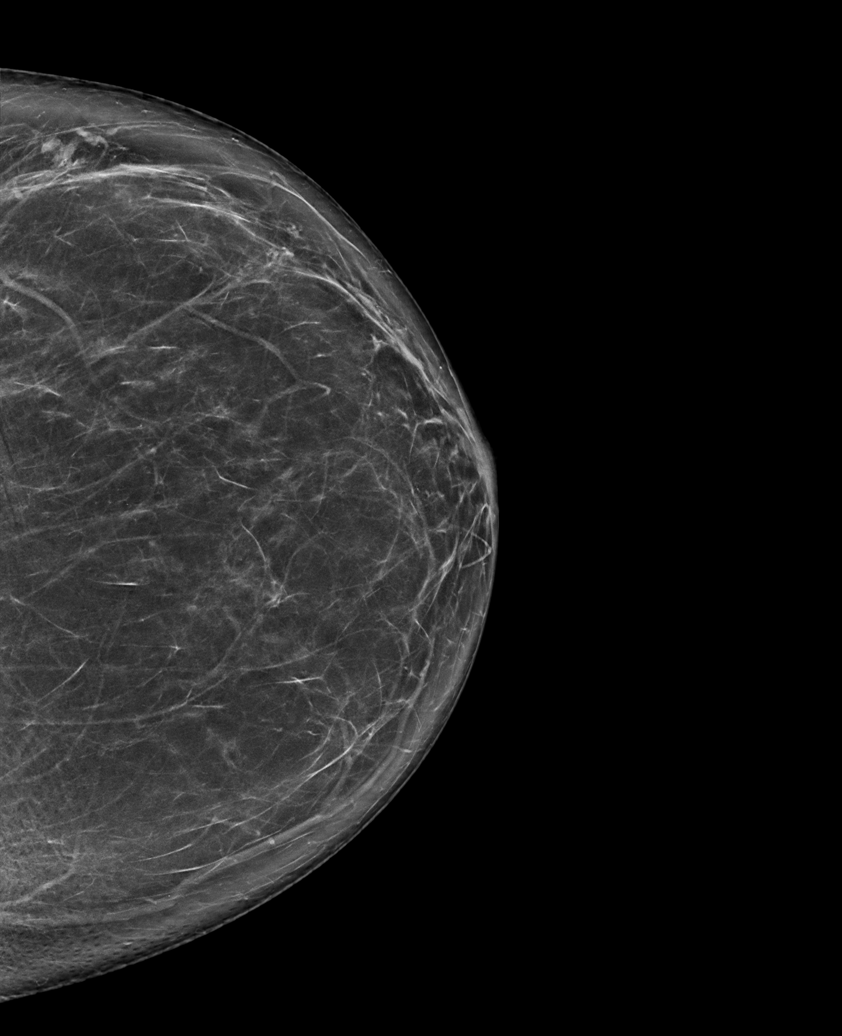

[L CC tomo · tomo slice 34/67.0]
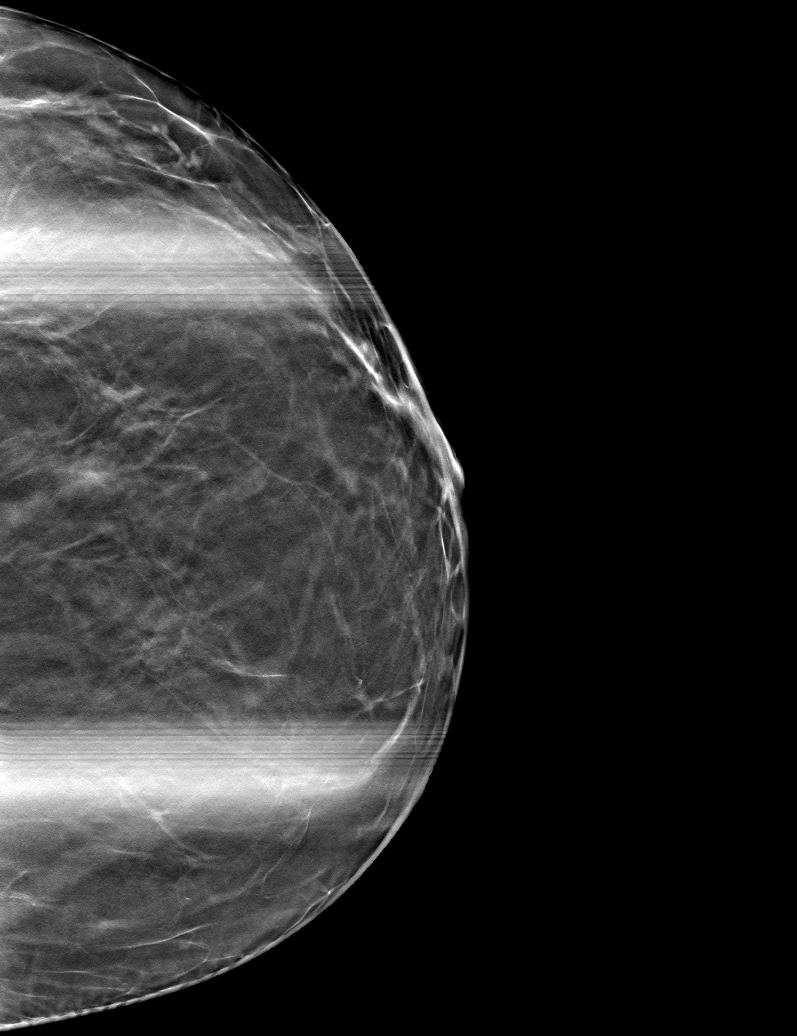

[6 of 30 positions shown; findings below may reference images not displayed]

ACR Breast Density Category b: There are scattered areas of
fibroglandular density.
FINDINGS: Spot compression tomosynthesis images through the area of concern in
the central left breast demonstrates no persistent areas of
distortion. A full paddle true lateral as well as full paddle rolled
medial and lateral CC images also demonstrate resolution of the
appearance of distortion. No suspicious calcifications, masses or
areas of distortion are seen in the left breast.

Mammographic images were processed with CAD.
IMPRESSION: Resolution of the left breast distortion consistent with overlapping
fibroglandular tissue.

RECOMMENDATION:
Screening mammogram in one year.(Code:NN-L-JHU)

I have discussed the findings and recommendations with the patient.
If applicable, a reminder letter will be sent to the patient
regarding the next appointment.

BI-RADS CATEGORY  1: Negative.

## 2021-08-11 ENCOUNTER — Ambulatory Visit: Payer: Self-pay

## 2021-08-11 ENCOUNTER — Encounter: Payer: Self-pay | Admitting: Family Medicine

## 2021-08-11 ENCOUNTER — Ambulatory Visit (INDEPENDENT_AMBULATORY_CARE_PROVIDER_SITE_OTHER): Payer: Medicare Other

## 2021-08-11 ENCOUNTER — Ambulatory Visit: Payer: Medicare Other | Admitting: Family Medicine

## 2021-08-11 VITALS — BP 120/80 | HR 70 | Ht 61.0 in | Wt 196.0 lb

## 2021-08-11 DIAGNOSIS — M25511 Pain in right shoulder: Secondary | ICD-10-CM

## 2021-08-11 NOTE — Patient Instructions (Addendum)
Good to see you today.  You had a R shoulder ([place] joint) injection.  Call or go to the ER if you develop a large red swollen joint with extreme pain or oozing puss.   Please get an Xray today before you leave.  Follow-up: early next week

## 2021-08-11 NOTE — Progress Notes (Signed)
I, Morgan Hurst, LAT, ATC, am serving as scribe for Dr. Lynne Leader.  Morgan Hurst is a 76 y.o. female who presents to [place] at [facility] today for R shoulder and arm pain.  She was last seen by Dr. Tamala Hurst on 10/23/20 for R hip pain.  Today, pt reports R shoulder and arm pain since last night w/ no known MOI.  She states that she woke up on the recliner last night around 10 pm w/ some shoulder pain and stiffness that progressively worsened through the night.  She locates her pain to her R shoulder, upper arm and upper trap .  Radiating pain: yes into her R upper arm to the elbow and R upper trap Neck pain: yes R shoulder mechanical symptoms: no R UE paresthesias: no Aggravating factors: R shoulder AROM in any direction Treatments tried: diclofenac; Tylenol; BenGay  Diagnostic testing: C-spine MRI- 04/12/20; C-spine XR- 03/20/20  Pertinent review of systems: No fevers or chills  Relevant historical information: History of frozen shoulder in 2019   Exam:  BP 120/80 (BP Location: Left Arm, Patient Position: Sitting, Cuff Size: Normal)   Pulse 70   Ht 5' 1"  (1.549 m)   Wt 196 lb (88.9 kg)   SpO2 94%   BMI 37.03 kg/m  General: Well Developed, well nourished, and in no acute distress.   MSK: Right shoulder: Normal. Mildly tender palpation. Significantly limited range of motion to abduction external and internal rotation all producing pain. Strength reduced abduction external and internal rotation 4/5 with pain. Inadequate positioning for Hawkins or Neer's test. Mildly positive Yergason's and speeds test. Pulses capillary fill and sensation are intact distally.    Lab and Radiology Results  Procedure: Real-time Ultrasound Guided Injection of right shoulder glenohumeral joint posterior approach Device: Philips Affiniti 50G Images permanently stored and available for review in PACS Ultrasound evaluation prior to injection reveals intact rotator cuff  tendons without significant subacromial bursitis. Verbal informed consent obtained.  Discussed risks and benefits of procedure. Warned about infection, bleeding, hyperglycemia damage to structures among others. Patient expresses understanding and agreement Time-out conducted.   Noted no overlying erythema, induration, or other signs of local infection.   Skin prepped in a sterile fashion.   Local anesthesia: Topical Ethyl chloride.   With sterile technique and under real time ultrasound guidance: 40 mg of Kenalog and 2 mL of Marcaine injected into glenohumeral joint. Fluid seen entering the joint capsule.   Completed without difficulty   Pain moderately resolved suggesting accurate placement of the medication.   Advised to call if fevers/chills, erythema, induration, drainage, or persistent bleeding.   Images permanently stored and available for review in the ultrasound unit.  Impression: Technically successful ultrasound guided injection.   X-ray images right shoulder obtained today personally and independently interpreted Mild to moderate AC DJD.  No severe glenohumeral DJD.  No acute fractures. Await for radiology    Assessment and Plan: 76 y.o. female with right shoulder pain.  Patient has severe shoulder pain with lack of range of motion without a clear injury picture or cause.  She does have a history of adhesive capsulitis in the past.  Today's pain is consistent with developing early adhesive capsulitis.  She had a glenohumeral injection in clinic which did not provide immediate pain relief which indicates that her pain generator may be elsewhere.  I am hopeful that the steroid component will provide some help or relief of her pain.  Regardless have advised her  to schedule with me early next week so that we can proceed with a subacromial injection at that time if she still hurts.  If that does not help I would recommend an MRI of her shoulder to better understand the source of her  pain.   PDMP not reviewed this encounter. Orders Placed This Encounter  Procedures   [place] LIMITED JOINT SPACE STRUCTURES UP RIGHT(NO LINKED CHARGES)    Order Specific Question:   Reason for Exam (SYMPTOM  OR DIAGNOSIS REQUIRED)    Answer:   R shoulder pain    Order Specific Question:   Preferred imaging location?    Answer:   [place]   DG Shoulder Right    Standing Status:   Future    Number of Occurrences:   1    Standing Expiration Date:   09/11/2021    Order Specific Question:   Reason for Exam (SYMPTOM  OR DIAGNOSIS REQUIRED)    Answer:   R shoulder pain    Order Specific Question:   Preferred imaging location?    Answer:   [facility]   No orders of the defined types were placed in this encounter.    Discussed warning signs or symptoms. Please see discharge instructions. Patient expresses understanding.   The above documentation has been reviewed and is accurate and complete Lynne Leader, M.D.

## 2021-08-13 NOTE — Progress Notes (Signed)
Right shoulder x-ray shows some mild arthritis at the [facility] joint at the top of the shoulder.

## 2021-08-18 ENCOUNTER — Ambulatory Visit: Payer: Medicare Other | Admitting: Family Medicine

## 2021-08-25 ENCOUNTER — Encounter (INDEPENDENT_AMBULATORY_CARE_PROVIDER_SITE_OTHER): Payer: Self-pay

## 2021-08-25 DIAGNOSIS — E785 Hyperlipidemia, unspecified: Secondary | ICD-10-CM | POA: Diagnosis not present

## 2021-08-25 DIAGNOSIS — E65 Localized adiposity: Secondary | ICD-10-CM | POA: Diagnosis not present

## 2021-08-25 DIAGNOSIS — E119 Type 2 diabetes mellitus without complications: Secondary | ICD-10-CM | POA: Diagnosis not present

## 2021-09-27 ENCOUNTER — Other Ambulatory Visit: Payer: Self-pay | Admitting: Family

## 2021-09-27 DIAGNOSIS — M48062 Spinal stenosis, lumbar region with neurogenic claudication: Secondary | ICD-10-CM

## 2021-09-30 DIAGNOSIS — E65 Localized adiposity: Secondary | ICD-10-CM | POA: Diagnosis not present

## 2021-09-30 DIAGNOSIS — E119 Type 2 diabetes mellitus without complications: Secondary | ICD-10-CM | POA: Diagnosis not present

## 2021-09-30 DIAGNOSIS — G8929 Other chronic pain: Secondary | ICD-10-CM | POA: Diagnosis not present

## 2021-10-21 DIAGNOSIS — R609 Edema, unspecified: Secondary | ICD-10-CM | POA: Diagnosis not present

## 2021-10-21 DIAGNOSIS — E65 Localized adiposity: Secondary | ICD-10-CM | POA: Diagnosis not present

## 2021-10-21 DIAGNOSIS — E119 Type 2 diabetes mellitus without complications: Secondary | ICD-10-CM | POA: Diagnosis not present

## 2021-10-25 ENCOUNTER — Telehealth: Payer: Self-pay | Admitting: Pharmacy Technician

## 2021-10-25 DIAGNOSIS — Z596 Low income: Secondary | ICD-10-CM

## 2021-10-25 NOTE — Progress Notes (Addendum)
[facility])                                            [place] Team    10/25/2021  KATERYNA GRANTHAM [birth year] [id number]                                      Medication Assistance Referral-FOR 2024 RE ENROLLMENT  Referral From: [place]   Medication/Company: [employer] Patient application portion:  Education officer, museum portion: Faxed  to FNP [employer] verified via: [employer]. Teruko Joswick, [place]  [phone]

## 2021-11-05 ENCOUNTER — Other Ambulatory Visit: Payer: Self-pay | Admitting: Family

## 2021-11-05 DIAGNOSIS — R051 Acute cough: Secondary | ICD-10-CM

## 2021-11-12 ENCOUNTER — Ambulatory Visit: Payer: Medicare Other | Admitting: Family

## 2021-11-17 ENCOUNTER — Ambulatory Visit: Payer: Medicare Other | Admitting: Family

## 2021-11-17 ENCOUNTER — Ambulatory Visit (INDEPENDENT_AMBULATORY_CARE_PROVIDER_SITE_OTHER): Payer: Medicare Other | Admitting: Family

## 2021-11-17 ENCOUNTER — Encounter: Payer: Self-pay | Admitting: Family

## 2021-11-17 VITALS — BP 134/82 | HR 73 | Temp 98.2°F | Ht 61.0 in | Wt 193.8 lb

## 2021-11-17 DIAGNOSIS — Z23 Encounter for immunization: Secondary | ICD-10-CM | POA: Diagnosis not present

## 2021-11-17 DIAGNOSIS — R600 Localized edema: Secondary | ICD-10-CM | POA: Diagnosis not present

## 2021-11-17 DIAGNOSIS — B372 Candidiasis of skin and nail: Secondary | ICD-10-CM | POA: Diagnosis not present

## 2021-11-17 DIAGNOSIS — J40 Bronchitis, not specified as acute or chronic: Secondary | ICD-10-CM

## 2021-11-17 DIAGNOSIS — E538 Deficiency of other specified B group vitamins: Secondary | ICD-10-CM | POA: Diagnosis not present

## 2021-11-17 DIAGNOSIS — E785 Hyperlipidemia, unspecified: Secondary | ICD-10-CM | POA: Diagnosis not present

## 2021-11-17 DIAGNOSIS — M5030 Other cervical disc degeneration, unspecified cervical region: Secondary | ICD-10-CM | POA: Diagnosis not present

## 2021-11-17 DIAGNOSIS — D510 Vitamin B12 deficiency anemia due to intrinsic factor deficiency: Secondary | ICD-10-CM

## 2021-11-17 LAB — B12 AND FOLATE PANEL
Folate: 14.8 ng/mL (ref >5.9)
Vitamin B-12: 197 pg/mL — ABNORMAL LOW (ref 211–911)

## 2021-11-17 LAB — COMPREHENSIVE METABOLIC PANEL
ALT: 11 U/L (ref 0–35)
AST: 17 U/L (ref 0–37)
Albumin: 4.1 g/dL (ref 3.5–5.2)
Alkaline Phosphatase: 71 U/L (ref 39–117)
BUN: 18 mg/dL (ref 6–23)
CO2: 28 mEq/L (ref 19–32)
Calcium: 9.7 mg/dL (ref 8.4–10.5)
Chloride: 102 mEq/L (ref 96–112)
Creatinine, Ser: 0.7 mg/dL (ref 0.40–1.20)
GFR: 84.58 mL/min (ref >60.00)
Glucose, Bld: 89 mg/dL (ref 70–99)
Potassium: 3.5 mEq/L (ref 3.5–5.1)
Sodium: 139 mEq/L (ref 135–145)
Total Bilirubin: 0.5 mg/dL (ref 0.2–1.2)
Total Protein: 7.6 g/dL (ref 6.0–8.3)

## 2021-11-17 LAB — LIPID PANEL
Cholesterol: 219 mg/dL — ABNORMAL HIGH (ref 0–200)
HDL: 54.4 mg/dL (ref >39.00)
LDL Cholesterol: 142 mg/dL — ABNORMAL HIGH (ref 0–99)
NonHDL: 164.71
Total CHOL/HDL Ratio: 4
Triglycerides: 115 mg/dL (ref 0.0–149.0)
VLDL: 23 mg/dL (ref 0.0–40.0)

## 2021-11-17 MED ORDER — BENZONATATE 100 MG PO CAPS: 100.0000 mg | ORAL_CAPSULE | Freq: Three times a day (TID) | ORAL | 1 refills | Status: DC | PRN

## 2021-11-17 MED ORDER — CYANOCOBALAMIN 1000 MCG/ML IJ SOLN: INTRAMUSCULAR | 2 refills | Status: DC

## 2021-11-17 MED ORDER — CLOTRIMAZOLE 1 % EX CREA: 1.0000 | TOPICAL_CREAM | Freq: Two times a day (BID) | CUTANEOUS | 2 refills | Status: DC

## 2021-11-17 MED ORDER — POTASSIUM CHLORIDE CRYS ER 10 MEQ PO TBCR: EXTENDED_RELEASE_TABLET | ORAL | 1 refills | Status: DC

## 2021-11-17 NOTE — Progress Notes (Signed)
Subjective:    Patient ID: Morgan Hurst, female    DOB: 04/12/1945, 76 y.o.   MRN: [id number]  CC: Morgan Hurst is a 76 y.o. female who presents today for follow up.   HPI: Complains of rash bilateral in groin, comes and goes. For several months.  Skin 'will crack' No discharge, fever, increased heat.   She has used neosporin with has worked in the past.     Low back pain- Improved. Tramadol 50 mg as needed with relief.  She used diclofenac and tylenol 568m qd. She has not needed zanaflex recently.   Hyperlipidemia-compliant with Zetia 10 mg, Crestor 10 mg 3 days/week. Leg swelling-compliant with metolazone 5 mg PRN or torsemide 20 mg PRN, with potassium 10 mg PRN   She requests refill of tessalon to have on hand for cough, recent bronchitis. Cough resolved.   Energy improves on B12 injections  HISTORY:  Past Medical History:  Diagnosis Date  . Arthritis    lower back, right hip  . B12 deficiency   . Back pain   . Borderline diabetes    PCP STARTED PT ON METFORMIN IN 2016 DUE TO ELEVATED GLUCOSE   . Complication of anesthesia    FIGHTING WHEN WAKING UP FROM ANESTHESIA  . Constipation   . Diabetes mellitus without complication ([place]   . Endometriosis   . Fatty liver   . Gallbladder problem   . GERD (gastroesophageal reflux disease)   . Heart murmur   . High cholesterol   . Hip pain   . Joint pain   . Kidney stones   . Leg weakness   . Orthodontics    top front 3 teeth caps and bridge  . Prediabetes   . RA [place]umatoid arthritis) ([place]   . SOB (shortness of breath)   . Stomach ulcer   . Swelling   . Vitamin D deficiency    Past Surgical History:  Procedure Laterality Date  . ABDOMINAL HYSTERECTOMY  age 76 . ANKLE SURGERY    . CHOLECYSTECTOMY N/A 04/10/2015   Procedure: LAPAROSCOPIC CHOLECYSTECTOMY WITH INTRAOPERATIVE CHOLANGIOGRAM;  Surgeon: JRobert Bellow MD;  Location: [facility] ORS;  Service: General;  Laterality: N/A;  . COLONOSCOPY WITH  PROPOFOL N/A 07/10/2017   Procedure: COLONOSCOPY WITH PROPOFOL;  Surgeon: AJonathon Bellows MD;  [facility];  Service: Gastroenterology;  Laterality: N/A;  . ESOPHAGOGASTRODUODENOSCOPY N/A 05/12/2015   Procedure: ESOPHAGOGASTRODUODENOSCOPY (EGD);  Surgeon: PHulen Luster MD;  [place]  Service: Gastroenterology;  Laterality: N/A;  . VAGINAL DELIVERY  2   Family History  Problem Relation Age of Onset  . Diabetes Mother   . Cancer Mother        colon and breast  . Breast cancer Mother   . Liver disease Mother   . Cancer Father        Leukemia  . Cancer Brother        colon and lung  . Colon cancer Brother   . Cholelithiasis Daughter   . Heart disease Paternal Grandfather   . Cancer Other 29       colon  . Thyroid cancer Neg Hx     Allergies: Patient has no known allergies. Current Outpatient Medications on File Prior to Visit  Medication Sig Dispense Refill  . acetaminophen (TYLENOL) 650 MG CR tablet Take 650 mg by mouth every 8 (eight) hours as needed for pain.    . calcium carbonate (OSCAL) 1500 (600 Ca) MG TABS tablet  Take 600 mg by mouth daily.    . cholecalciferol (VITAMIN D3) 25 MCG (1000 UNIT) tablet Take 2,000 Units by mouth daily.    . colesevelam (WELCHOL) 625 MG tablet Take 625 mg by mouth daily as needed.    . diclofenac (VOLTAREN) 75 MG EC tablet Take 1 tablet (75 mg total) by mouth 2 (two) times daily. 60 tablet 3  . esomeprazole (NEXIUM) 40 MG capsule Take 40 mg by mouth daily as needed.    . ezetimibe (ZETIA) 10 MG tablet Take 1 tablet (10 mg total) by mouth daily. 90 tablet 3  . metolazone (ZAROXOLYN) 5 MG tablet Take 5 mg by mouth daily.    . ondansetron (ZOFRAN-ODT) 4 MG disintegrating tablet Take 1 tablet (4 mg total) by mouth every 8 (eight) hours as needed. 20 tablet 0  . rOPINIRole (REQUIP) 0.25 MG tablet TAKE 1 TABLET(0.25 MG) BY MOUTH AT BEDTIME AS NEEDED 90 tablet 0  . rosuvastatin (CRESTOR) 10 MG tablet Take 1 tablet (10 mg  total) by mouth daily. (Patient taking differently: Take 10 mg by mouth 3 (three) times a week.) 90 tablet 3  . Semaglutide, 1 MG/DOSE, 4 MG/3ML SOPN Inject 1 mg as directed once a week. 3 mL 0  . torsemide (DEMADEX) 20 MG tablet Take 20 mg by mouth daily as needed.    . traMADol (ULTRAM) 50 MG tablet TAKE 1 TABLET(50 MG) BY MOUTH DAILY AS NEEDED 30 tablet 0  . Vitamin E 400 units TABS Take 400 Units by mouth daily.    Marland Kitchen buPROPion (WELLBUTRIN XL) 150 MG 24 hr tablet Take 1 tablet (150 mg total) by mouth daily. (Patient not taking: Reported on 11/17/2021) 90 tablet 2  . tiZANidine (ZANAFLEX) 2 MG tablet TAKE 1 TO 2 TABLETS(2 TO 4 MG) BY MOUTH EVERY 8 HOURS AS NEEDED FOR MUSCLE SPASMS (Patient not taking: Reported on 06/18/2021) 60 tablet 1   No current facility-administered medications on file prior to visit.    Social History   Tobacco Use  . Smoking status: Never  . Smokeless tobacco: Never  Vaping Use  . Vaping Use: Never used  Substance Use Topics  . Alcohol use: No  . Drug use: No    Review of Systems  Constitutional:  Negative for chills and fever.  Respiratory:  Negative for cough.   Cardiovascular:  Negative for chest pain and palpitations.  Gastrointestinal:  Negative for nausea and vomiting.  Skin:  Positive for rash.      Objective:    BP 134/82 (BP Location: Left Arm, Patient Position: Sitting, Cuff Size: Normal)   Pulse 73   Temp 98.2 F (36.8 C) (Oral)   Ht _0  (1.549 m)   Wt 193 lb 12.8 oz (87.9 kg)   SpO2 97%   BMI 36.62 kg/m  BP Readings from Last 3 Encounters:  11/17/21 134/82  08/11/21 120/80  06/20/21 (!) 153/75   Wt Readings from Last 3 Encounters:  11/17/21 193 lb 12.8 oz (87.9 kg)  08/11/21 196 lb (88.9 kg)  06/18/21 199 lb 9.6 oz (90.5 kg)    Physical Exam Vitals reviewed.  Constitutional:      Appearance: She is well-developed.  Eyes:     Conjunctiva/sclera: Conjunctivae normal.  Cardiovascular:     Rate and Rhythm: Normal rate and  regular rhythm.     Pulses: Normal pulses.     Heart sounds: Normal heart sounds.  Pulmonary:     Effort: Pulmonary effort is normal.  Breath sounds: Normal breath sounds. No wheezing, rhonchi or rales.  Skin:    General: Skin is warm and dry.     Findings: Rash present.          Comments: Hypererythematous rash white exudate present.  Neurological:     Mental Status: She is alert.  Psychiatric:        Speech: Speech normal.        Behavior: Behavior normal.        Thought Content: Thought content normal.       Assessment & Plan:   Problem List Items Addressed This Visit       Respiratory   Bronchitis   Relevant Medications   benzonatate (TESSALON) 100 MG capsule     Musculoskeletal and Integument   Candidal intertrigo    Presentation consistent with candidal intertrigo.  Start clomitrazole      Relevant Medications   clotrimazole (LOTRIMIN) 1 % cream   Degenerative disc disease, cervical    Chronic, stable.  Continue Tramadol 50 mg as needed with relief, diclofenac and tylenol 535m qd. She has not needed zanaflex recently.         Other   B12 deficiency   Relevant Medications   cyanocobalamin (DODEX) 1000 MCG/ML injection   Other Relevant Orders   B12 and Folate Panel   Homocysteine   Anti-parietal antibody   Celiac Disease Ab Screen w/Rfx   Intrinsic Factor Antibodies   Methylmalonic acid, serum   Hyperlipidemia - Primary   Relevant Orders   Lipid Profile   Comp Met (CMET)   Pernicious anemia    History pernicious anemia and patient remains compliant with B12 injections monthly. I sent a message to Dr. AVicente Malesin regards to repeat EGD.  Last EGD, normal in 2017.      Relevant Medications   cyanocobalamin (DODEX) 1000 MCG/ML injection   Other Visit Diagnoses     Lower extremity edema       Relevant Medications   potassium chloride (KLOR-CON M) 10 MEQ tablet   Need for immunization against influenza       Relevant Orders   Flu Vaccine QUAD  High Dose(Fluad) (Completed)        I have discontinued Marcile M. Stabler's sennosides-docusate sodium, fluorouracil, predniSONE, albuterol, and Cheratussin AC. I have also changed her potassium chloride. Additionally, I am having her start on clotrimazole and benzonatate. Lastly, I am having her maintain her esomeprazole, colesevelam, cholecalciferol, calcium carbonate, acetaminophen, torsemide, rOPINIRole, metolazone, Vitamin E, tiZANidine, Semaglutide (1 MG/DOSE), rosuvastatin, buPROPion, ondansetron, diclofenac, ezetimibe, traMADol, and cyanocobalamin.   Meds ordered this encounter  Medications  . clotrimazole (LOTRIMIN) 1 % cream    Sig: Apply 1 Application topically 2 (two) times daily.    Dispense:  30 g    Refill:  2    Order Specific Question:   Supervising Provider    Answer:   TDerrel Nip TERESA L [[id number]]  . benzonatate (TESSALON) 100 MG capsule    Sig: Take 1 capsule (100 mg total) by mouth 3 (three) times daily as needed for cough.    Dispense:  30 capsule    Refill:  1    Order Specific Question:   Supervising Provider    Answer:   TDeborra MedinaL [[id number]]  . potassium chloride (KLOR-CON M) 10 MEQ tablet    Sig: Take 1 tablet by mouth twice daily. **Take with Metolazone.**    Dispense:  180 tablet    Refill:  1  Order Specific Question:   Supervising Provider    Answer:   Crecencio Mc [[id number]]  . cyanocobalamin (DODEX) 1000 MCG/ML injection    Sig: INJECT 1ML ONCE PER MONTH    Dispense:  10 mL    Refill:  2    Order Specific Question:   Supervising Provider    Answer:   Crecencio Mc [[id number]]    Return precautions given.   Risks, benefits, and alternatives of the medications and treatment plan prescribed today were discussed, and patient expressed understanding.   Education regarding symptom management and diagnosis given to patient on AVS.  Continue to follow with Burnard Hawthorne, FNP for routine health maintenance.   [name] and I agreed with plan.    Mable Paris, FNP

## 2021-11-17 NOTE — Assessment & Plan Note (Signed)
Presentation consistent with candidal intertrigo.  Start clomitrazole

## 2021-11-17 NOTE — Patient Instructions (Addendum)
I suspect you have yeast infection under skin fold. This is called intertrigo candidiasis  Please use topical clotrimazole ointment which is an antifungal and follow the instructions below.   Daily cleansing of intertriginous skin with a mild cleanser followed by drying of affected area with a hair dryer on a cool setting  ?Aeration of affected area when feasible and drying after shower. You may even use blow dryer on COOL setting after showers, baths to dry area  ?Daily application of drying powders, such as powders composed of microporous cellulose  ?Use of absorbent material or clothing, such as cotton or merino wool, to separate skin in folds  ?Application of barrier creams such as zinc oxide in areas that may come in contact with urine or feces  Intertrigo Intertrigo is skin irritation (inflammation) that happens in warm, moist areas of the body. The irritation can cause a rash and make skin raw and itchy. The rash is usually pink or red. It happens mostly between folds of skin or where skin rubs together, such as: Between the toes. In the armpits. In the groin area. Under the belly. Under the breasts. Around the butt area. This condition is not passed from person to person (is not contagious). What are the causes? Heat, moisture, rubbing, and not enough air movement. The condition can be made worse by: Sweat. Bacteria. A fungus, such as yeast. What increases the risk? Moisture in your skin folds. You are more likely to develop this condition if you: Have diabetes. Are overweight. Are not able to move around. Live in a warm and moist climate. Wear splints, braces, or other medical devices. Are not able to control your pee (urine) or poop (stool). What are the signs or symptoms? A pink or red skin rash in the skin fold or near the skin fold. Raw or scaly skin. Itching. A burning feeling. Bleeding. Leaking fluid. A bad smell. How is this treated? Cleaning and drying  your skin. Taking an antibiotic medicine or using an antibiotic skin cream for a bacterial infection. Using an antifungal cream on your skin or taking pills for an infection that was caused by a fungus, such as yeast. Using a steroid ointment to stop the itching and irritation. Separating the skin fold with a clean cotton cloth to absorb moisture and allow air to flow into the area. Follow these instructions at home: Keep the affected area clean and dry. Do not scratch your skin. Stay cool as much as you can. Use an air conditioner or a fan, if you have one. Apply over-the-counter and prescription medicines only as told by your doctor. If you were prescribed an antibiotic medicine, use it as told by your doctor. Do not stop using the antibiotic even if your condition starts to get better. Keep all follow-up visits as told by your doctor. This is important. How is this prevented?  Stay at a healthy weight. Take care of your feet. This is very important if you have diabetes. You should: Wear shoes that fit well. Keep your feet dry. Wear clean cotton or wool socks. Protect the skin in your groin and butt area as told by your doctor. To do this: Follow a regular cleaning routine. Use creams, powders, or ointments that protect your skin. Change protection pads often. Do not wear tight clothes. Wear clothes that: Are loose. Take moisture away from your body. Are made of cotton. Wear a bra that gives good support, if needed. Shower and dry yourself well after being  active. Use a hair dryer on a cool setting to dry between skin folds. Keep your blood sugar under control if you have diabetes. Contact a doctor if: Your symptoms do not get better with treatment. Your symptoms get worse or they spread. You notice more redness and warmth. You have a fever. Summary Intertrigo is skin irritation that occurs when folds of skin rub together. This condition is caused by heat, moisture, and  rubbing. This condition may be treated by cleaning and drying your skin and with medicines. Apply over-the-counter and prescription medicines only as told by your doctor. Keep all follow-up visits as told by your doctor. This is important. This information is not intended to replace advice given to you by your health care provider. Make sure you discuss any questions you have with your health care provider. Document Revised: 10/12/2017 Document Reviewed: 10/12/2017 Elsevier Patient Education  2022 [employer].

## 2021-11-17 NOTE — Assessment & Plan Note (Signed)
Chronic, stable.  Continue Tramadol 50 mg as needed with relief, diclofenac and tylenol 52m qd. She has not needed zanaflex recently.

## 2021-11-17 NOTE — Assessment & Plan Note (Signed)
History pernicious anemia and patient remains compliant with B12 injections monthly. I sent a message to Dr. Vicente Males in regards to repeat EGD.  Last EGD, normal in 2017.

## 2021-11-18 DIAGNOSIS — E782 Mixed hyperlipidemia: Secondary | ICD-10-CM | POA: Diagnosis not present

## 2021-11-18 DIAGNOSIS — K219 Gastro-esophageal reflux disease without esophagitis: Secondary | ICD-10-CM | POA: Diagnosis not present

## 2021-11-18 DIAGNOSIS — E1169 Type 2 diabetes mellitus with other specified complication: Secondary | ICD-10-CM | POA: Diagnosis not present

## 2021-11-23 ENCOUNTER — Ambulatory Visit (INDEPENDENT_AMBULATORY_CARE_PROVIDER_SITE_OTHER): Payer: Medicare Other

## 2021-11-23 VITALS — Ht 61.0 in | Wt 193.0 lb

## 2021-11-23 DIAGNOSIS — Z0000 Encounter for general adult medical examination without abnormal findings: Secondary | ICD-10-CM | POA: Diagnosis not present

## 2021-11-23 LAB — HOMOCYSTEINE: Homocysteine: 11 umol/L — ABNORMAL HIGH (ref <10.4)

## 2021-11-23 LAB — INTRINSIC FACTOR ANTIBODIES: Intrinsic Factor: NEGATIVE

## 2021-11-23 LAB — ANTI-PARIETAL ANTIBODY: PARIETAL CELL AB SCREEN: NEGATIVE

## 2021-11-23 LAB — METHYLMALONIC ACID, SERUM: Methylmalonic Acid, Quant: 236 nmol/L (ref 87–318)

## 2021-11-23 NOTE — Progress Notes (Signed)
Subjective:   Morgan Hurst is a 76 y.o. female who presents for Medicare Annual (Subsequent) preventive examination.  Review of Systems    No ROS.  Medicare Wellness Virtual Visit.  Visual/audio telehealth visit, UTA vital signs.   See social history for additional risk factors.   Cardiac Risk Factors include: advanced age (>2mn, >>50women)     Objective:    Today's Vitals   11/23/21 1503  Weight: 193 lb (87.5 kg)  Height: 5' 1"  (1.549 m)   Body mass index is 36.47 kg/m.     11/23/2021    3:03 PM 11/19/2020   10:20 AM 08/27/2019   11:21 AM 08/24/2018   10:12 AM 08/22/2017   10:22 AM 07/10/2017    7:11 AM 08/11/2016    1:12 PM  Advanced Directives  Does Patient Have a Medical Advance Directive? Yes Yes Yes Yes Yes Yes Yes  Type of AParamedicof AReynoLiving will HBaldwin ParkLiving will Living will HXeniaLiving will HKennettLiving will  HStev[place]Living will  Does patient want to make changes to medical advance directive? No - Patient declined No - Patient declined No - Patient declined No - Patient declined No - Patient declined  No - Patient declined  Copy of HPark Cityin Chart? No - copy requested No - copy requested  No - copy requested No - copy requested  No - copy requested    Current Medications (verified) Outpatient Encounter Medications as of 11/23/2021  Medication Sig   acetaminophen (TYLENOL) 650 MG CR tablet Take 650 mg by mouth every 8 (eight) hours as needed for pain.   benzonatate (TESSALON) 100 MG capsule Take 1 capsule (100 mg total) by mouth 3 (three) times daily as needed for cough.   buPROPion (WELLBUTRIN XL) 150 MG 24 hr tablet Take 1 tablet (150 mg total) by mouth daily. (Patient not taking: Reported on 11/17/2021)   calcium carbonate (OSCAL) 1500 (600 Ca) MG TABS tablet Take 600 mg by mouth daily.   cholecalciferol (VITAMIN D3)  25 MCG (1000 UNIT) tablet Take 2,000 Units by mouth daily.   clotrimazole (LOTRIMIN) 1 % cream Apply 1 Application topically 2 (two) times daily.   colesevelam (WELCHOL) 625 MG tablet Take 625 mg by mouth daily as needed.   cyanocobalamin (DODEX) 1000 MCG/ML injection INJECT 1ML ONCE PER MONTH   diclofenac (VOLTAREN) 75 MG EC tablet Take 1 tablet (75 mg total) by mouth 2 (two) times daily.   esomeprazole (NEXIUM) 40 MG capsule Take 40 mg by mouth daily as needed.   ezetimibe (ZETIA) 10 MG tablet Take 1 tablet (10 mg total) by mouth daily.   metolazone (ZAROXOLYN) 5 MG tablet Take 5 mg by mouth daily.   ondansetron (ZOFRAN-ODT) 4 MG disintegrating tablet Take 1 tablet (4 mg total) by mouth every 8 (eight) hours as needed.   potassium chloride (KLOR-CON M) 10 MEQ tablet Take 1 tablet by mouth twice daily. **Take with Metolazone.**   rOPINIRole (REQUIP) 0.25 MG tablet TAKE 1 TABLET(0.25 MG) BY MOUTH AT BEDTIME AS NEEDED   rosuvastatin (CRESTOR) 10 MG tablet Take 1 tablet (10 mg total) by mouth daily. (Patient taking differently: Take 10 mg by mouth 3 (three) times a week.)   Semaglutide, 1 MG/DOSE, 4 MG/3ML SOPN Inject 1 mg as directed once a week.   tiZANidine (ZANAFLEX) 2 MG tablet TAKE 1 TO 2 TABLETS(2 TO 4 MG) BY MOUTH EVERY  8 HOURS AS NEEDED FOR MUSCLE SPASMS (Patient not taking: Reported on 06/18/2021)   torsemide (DEMADEX) 20 MG tablet Take 20 mg by mouth daily as needed.   traMADol (ULTRAM) 50 MG tablet TAKE 1 TABLET(50 MG) BY MOUTH DAILY AS NEEDED   Vitamin E 400 units TABS Take 400 Units by mouth daily.   No facility-administered encounter medications on file as of 11/23/2021.    Allergies (verified) Patient has no known allergies.   History: Past Medical History:  Diagnosis Date   Arthritis    lower back, right hip   B12 deficiency    Back pain    Borderline diabetes    PCP STARTED PT ON METFORMIN IN 03/20/2014 DUE TO ELEVATED GLUCOSE    Complication of anesthesia    FIGHTING WHEN  WAKING UP FROM ANESTHESIA   Constipation    Diabetes mellitus without complication (HCC)    Endometriosis    Fatty liver    Gallbladder problem    GERD (gastroesophageal reflux disease)    Heart murmur    High cholesterol    Hip pain    Joint pain    Kidney stones    Leg weakness    Orthodontics    top front 3 teeth caps and bridge   Prediabetes    RA (rheumatoid arthritis) (HCC)    SOB (shortness of breath)    Stomach ulcer    Swelling    Vitamin D deficiency    Past Surgical History:  Procedure Laterality Date   ABDOMINAL HYSTERECTOMY  age 48   ANKLE SURGERY     CHOLECYSTECTOMY N/A 04/10/2015   Procedure: LAPAROSCOPIC CHOLECYSTECTOMY WITH INTRAOPERATIVE CHOLANGIOGRAM;  Surgeon: Robert Bellow, MD;  Location: [facility] ORS;  Service: General;  Laterality: N/A;   COLONOSCOPY WITH PROPOFOL N/A 07/10/2017   Procedure: COLONOSCOPY WITH PROPOFOL;  Surgeon: Jonathon Bellows, MD;  [facility] ENDOSCOPY;  Service: Gastroenterology;  Laterality: N/A;   ESOPHAGOGASTRODUODENOSCOPY N/A 05/12/2015   Procedure: ESOPHAGOGASTRODUODENOSCOPY (EGD);  Surgeon: Hulen Luster, MD;  Location: [facility];  Service: Gastroenterology;  Laterality: N/A;   VAGINAL DELIVERY  2   Family History  Problem Relation Age of Onset   Diabetes Mother    Cancer Mother        colon and breast   Breast cancer Mother    Liver disease Mother    Cancer Father        Leukemia   Cancer Brother        colon and lung   Colon cancer Brother    Cholelithiasis Daughter    Heart disease Paternal Grandfather    Cancer Other 29       colon   Thyroid cancer Neg Hx    Social History   Socioeconomic History   Marital status: Widowed    Spouse name: Not on file   Number of children: 2   Years of education: Not on file   Highest education level: Not on file  Occupational History   Occupation: Retired  Tobacco Use   Smoking status: Never   Smokeless tobacco: Never  Vaping Use   Vaping Use: Never used   Substance and Sexual Activity   Alcohol use: No   Drug use: No   Sexual activity: Not Currently  Other Topics Concern   Not on file  Social History Narrative   Lives in [place].       Husband passed away 03/20/2014 from PNA.      Work - retired from  septic tank service      Diet - regular; working on weight   Exercise - walks occasionally, limited by fatigue. [place] and takes care of her 6 year old grandchild.    Social Determinants of Health   Financial Resource Strain: Low Risk  (11/23/2021)   Overall Financial Resource Strain (CARDIA)    Difficulty of Paying Living Expenses: Not hard at all  Food Insecurity: No Food Insecurity (11/23/2021)   Hunger Vital Sign    Worried About Running Out of Food in the Last Year: Never true    Ran Out of Food in the Last Year: Never true  Transportation Needs: No Transportation Needs (11/23/2021)   PRAPARE - Hydrologist (Medical): No    Lack of Transportation (Non-Medical): No  Physical Activity: Insufficiently Active (11/23/2021)   Exercise Vital Sign    Days of Exercise per Week: 5 days    Minutes of Exercise per Session: 20 min  Stress: No Stress Concern Present (11/23/2021)   [place]    Feeling of Stress : Not at all  Social Connections: Moderately Integrated (11/23/2021)   Social Connection and Isolation Panel [NHANES]    Frequency of Communication with Friends and Family: More than three times a week    Frequency of Social Gatherings with Friends and Family: More than three times a week    Attends Religious Services: More than 4 times per year    Active Member of [employer] or Organizations: Yes    Attends Archivist Meetings: More than 4 times per year    Marital Status: Widowed    Tobacco Counseling Counseling given: Not Answered   Clinical Intake:  Pre-visit preparation completed: Yes        Diabetes:  (Followed by  PCP)  How often do you need to have someone help you when you read instructions, pamphlets, or other written materials from your doctor or pharmacy?: 1 - Never  Interpreter Needed?: No    Activities of Daily Living    11/23/2021    3:05 PM  In your present state of health, do you have any difficulty performing the following activities:  Hearing? 0  Vision? 0  Difficulty concentrating or making decisions? 0  Walking or climbing stairs? 0  Dressing or bathing? 0  Doing errands, shopping? 0  Preparing Food and eating ? N  Using the Toilet? N  In the past six months, have you accidently leaked urine? N  Do you have problems with loss of bowel control? N  Managing your Medications? N  Managing your Finances? N  Housekeeping or managing your Housekeeping? N    Patient Care Team: Burnard Hawthorne, FNP as PCP - General (Family Medicine) Jackolyn Confer, MD (Internal Medicine) Bary Castilla Forest Gleason, MD (General Surgery)  Indicate any recent Medical Services you may have received from other than [facility] providers in the past year (date may be approximate).     Assessment:   This is a routine wellness examination for Morgan Hurst.  I connected with  Morgan Hurst on 11/23/21 by a audio enabled telemedicine application and verified that I am speaking with the correct person using two identifiers.  Patient Location: Home  Provider Location: Office/Clinic  I discussed the limitations of evaluation and management by telemedicine. The patient expressed understanding and agreed to proceed.   Hearing/Vision screen Hearing Screening - Comments:: Patient is able to hear conversational tones without difficulty.  No issues reported.   Vision Screening - Comments:: Followed by [facility] Wears corrective lenses when reading  Annual visits  Dietary issues and exercise activities discussed: Current Exercise Habits: Home exercise routine, Intensity: Mild   Goals Addressed              This Visit's Progress    Weight goal 180-200lb   On track    Continue making healthy meal choices.   Stay hydrated and drink plenty of water. Stay active.  Stretch.  Daily chair exercises when unable to walk.        Depression Screen    11/23/2021    3:10 PM 11/23/2021    3:05 PM 11/17/2021    8:17 AM 05/12/2021    9:08 AM 11/19/2020    9:56 AM 10/30/2020    9:04 AM 08/27/2019   11:16 AM  PHQ 2/9 Scores  PHQ - 2 Score 0 0 0 0 0 0 0  PHQ- 9 Score    0       Fall Risk    11/23/2021    3:05 PM 11/17/2021    8:17 AM 05/12/2021    9:08 AM 11/19/2020   10:02 AM 10/30/2020    9:04 AM  [place] in the past year? 0 0 0 0 0  Number falls in past yr: 0 0 0 0   Injury with Fall? 0 0 0 0   Risk for fall due to : No Fall Risks No Fall Risks No Fall Risks    Follow up Falls evaluation completed;Falls prevention discussed Falls evaluation completed Falls evaluation completed  Falls evaluation completed    FALL RISK PREVENTION PERTAINING TO THE HOME: Home free of loose throw rugs in walkways, pet beds, electrical cords, etc? Yes  Adequate lighting in your home to reduce risk of falls? Yes   ASSISTIVE DEVICES UTILIZED TO PREVENT FALLS: Life alert? No  Use of a cane, walker or w/c? No   TIMED UP AND GO: Was the test performed? No .    Cognitive Function:    08/22/2017   10:32 AM 08/11/2016    1:34 PM 08/12/2015    1:45 PM  MMSE - Mini Mental State Exam  Orientation to time 5 5 5   Orientation to Place 5 5 5   Registration 3 3 3   Attention/ Calculation 5 5 5   Recall 3 3 3   Language- name 2 objects 2 2 2   Language- repeat 1 1 1   Language- follow 3 step command 3 3 3   Language- read & follow direction 1 1 1   Write a sentence 1 1 1   Copy design 1 1 1   Total score 30 30 30         11/23/2021    3:16 PM 11/19/2020   10:05 AM 08/27/2019   11:24 AM 08/24/2018   10:14 AM  6CIT Screen  What Year? 0 points 0 points 0 points 0 points  What month? 0 points 0 points 0 points  0 points  What time? 0 points 0 points  0 points  Count back from 20 0 points 0 points  0 points  Months in reverse 0 points 0 points 0 points 0 points  Repeat phrase 0 points 0 points  0 points  Total Score 0 points 0 points  0 points    Immunizations Immunization History  Administered Date(s) Administered   Fluad Quad(high Dose 65+) 01/01/2020, 10/30/2020, 11/17/2021   Hepb-cpg 09/05/2019, 09/25/2019   Influenza  Split 12/26/2011   Influenza, High Dose Seasonal PF 11/25/2015, 10/13/2016, 09/26/2017, 11/30/2018   Influenza,inj,Quad PF,6+ Mos 12/25/2012, 03/18/2014, 10/07/2014   Pneumococcal Conjugate-13 03/18/2014   Pneumococcal Polysaccharide-23 12/26/2011   Tdap 12/25/2009   Zoster, Live 12/26/2010   TDAP status: Due, Education has been provided regarding the importance of this vaccine. Advised may receive this vaccine at local pharmacy or Health Dept. Aware to provide a copy of the vaccination record if obtained from local pharmacy or Health Dept. Verbalized acceptance and understanding.  Shingrix Completed?: No.    Education has been provided regarding the importance of this vaccine. Patient has been advised to call insurance company to determine out of pocket expense if they have not yet received this vaccine. Advised may also receive vaccine at local pharmacy or Health Dept. Verbalized acceptance and understanding.  Screening Tests Health Maintenance  Topic Date Due   TETANUS/TDAP  01/17/2022 (Originally 12/26/2019)   Zoster Vaccines- Shingrix (1 of 2) 02/23/2022 (Originally 06/05/1964)   Diabetic kidney evaluation - Urine ACR  02/10/2022   MAMMOGRAM  03/18/2022   COLONOSCOPY (Pts 45-89yr Insurance coverage will need to be confirmed)  07/11/2022   Diabetic kidney evaluation - GFR measurement  11/18/2022   Medicare Annual Wellness (AWV)  11/24/2022   Pneumonia Vaccine 75 Years old  Completed   INFLUENZA VACCINE  Completed   DEXA SCAN  Completed   Hepatitis C Screening   Completed   HPV VACCINES  Aged Out   COVID-19 Vaccine  Discontinued    Health Maintenance There are no preventive care reminders to display for this patient.  Lung Cancer Screening: (Low Dose CT Chest recommended if Age 75-80years, 30 pack-year currently smoking OR have quit w/in 15years.) does not qualify.   Hepatitis C Screening:  Completed 2018.  Vision Screening: Recommended annual ophthalmology exams for early detection of glaucoma and other disorders of the eye.  Dental Screening: Recommended annual dental exams for proper oral hygiene  Community Resource Referral / Chronic Care Management: CRR required this visit?  No   CCM required this visit?  No      Plan:     I have personally reviewed and noted the following in the patient's chart:   Medical and social history Use of alcohol, tobacco or illicit drugs  Current medications and supplements including opioid prescriptions. Patient is currently taking opioid prescriptions. Information provided to patient regarding non-opioid alternatives. Patient advised to discuss non-opioid treatment plan with their provider. Functional ability and status Nutritional status Physical activity Advanced directives List of other physicians Hospitalizations, surgeries, and ER visits in previous 12 months Vitals Screenings to include cognitive, depression, and falls Referrals and appointments  In addition, I have reviewed and discussed with patient certain preventive protocols, quality metrics, and best practice recommendations. A written personalized care plan for preventive services as well as general preventive health recommendations were provided to patient.     DLeta Jungling LPN   [id number]

## 2021-11-23 NOTE — Patient Instructions (Addendum)
Morgan Hurst , Thank you for taking time to come for your Medicare Wellness Visit. I appreciate your ongoing commitment to your health goals. Please review the following plan we discussed and let me know if I can assist you in the future.   These are the goals we discussed:  Goals      Weight goal 180-200lb     Continue making healthy meal choices.   Stay hydrated and drink plenty of water. Stay active.  Stretch.  Daily chair exercises when unable to walk.         This is a list of the screening recommended for you and due dates:  Health Maintenance  Topic Date Due   Tetanus Vaccine  01/17/2022*   Zoster (Shingles) Vaccine (1 of 2) 02/23/2022*   Yearly kidney health urinalysis for diabetes  02/10/2022   Mammogram  03/18/2022   Colon Cancer Screening  07/11/2022   Yearly kidney function blood test for diabetes  11/18/2022   Medicare Annual Wellness Visit  11/24/2022   Pneumonia Vaccine  Completed   Flu Shot  Completed   DEXA scan (bone density measurement)  Completed   Hepatitis C Screening: USPSTF Recommendation to screen - Ages 52-79 yo.  Completed   HPV Vaccine  Aged Out   COVID-19 Vaccine  Discontinued  *Topic was postponed. The date shown is not the original due date.    Advanced directives: End of life planning; Advance aging; Advanced directives discussed.  Copy of current HCPOA/Living Will requested.    Conditions/risks identified: none new  Next appointment: Follow up in one year for your annual wellness visit    Preventive Care 65 Years and Older, Female Preventive care refers to lifestyle choices and visits with your health care provider that can promote health and wellness. What does preventive care include? A yearly physical exam. This is also called an annual well check. Dental exams once or twice a year. Routine eye exams. Ask your health care provider how often you should have your eyes checked. Personal lifestyle choices, including: Daily care of your  teeth and gums. Regular physical activity. Eating a healthy diet. Avoiding tobacco and drug use. Limiting alcohol use. Practicing safe sex. Taking low-dose aspirin every day. Taking vitamin and mineral supplements as recommended by your health care provider. What happens during an annual well check? The services and screenings done by your health care provider during your annual well check will depend on your age, overall health, lifestyle risk factors, and family history of disease. Counseling  Your health care provider may ask you questions about your: Alcohol use. Tobacco use. Drug use. Emotional well-being. Home and relationship well-being. Sexual activity. Eating habits. History of falls. Memory and ability to understand (cognition). Work and work Statistician. Reproductive health. Screening  You may have the following tests or measurements: Height, weight, and BMI. Blood pressure. Lipid and cholesterol levels. These may be checked every 5 years, or more frequently if you are over 24 years old. Skin check. Lung cancer screening. You may have this screening every year starting at age 18 if you have a 30-pack-year history of smoking and currently smoke or have quit within the past 15 years. Fecal occult blood test (FOBT) of the stool. You may have this test every year starting at age 8. Flexible sigmoidoscopy or colonoscopy. You may have a sigmoidoscopy every 5 years or a colonoscopy every 10 years starting at age 70. Hepatitis C blood test. Hepatitis B blood test. Sexually transmitted disease (STD) testing.  Diabetes screening. This is done by checking your blood sugar (glucose) after you have not eaten for a while (fasting). You may have this done every 1-3 years. Bone density scan. This is done to screen for osteoporosis. You may have this done starting at age 105. Mammogram. This may be done every 1-2 years. Talk to your health care provider about how often you should have  regular mammograms. Talk with your health care provider about your test results, treatment options, and if necessary, the need for more tests. Vaccines  Your health care provider may recommend certain vaccines, such as: Influenza vaccine. This is recommended every year. Tetanus, diphtheria, and acellular pertussis (Tdap, Td) vaccine. You may need a Td booster every 10 years. Zoster vaccine. You may need this after age 64. Pneumococcal 13-valent conjugate (PCV13) vaccine. One dose is recommended after age 55. Pneumococcal polysaccharide (PPSV23) vaccine. One dose is recommended after age 55. Talk to your health care provider about which screenings and vaccines you need and how often you need them. This information is not intended to replace advice given to you by your health care provider. Make sure you discuss any questions you have with your health care provider. Document Released: 01/30/2015 Document Revised: 09/23/2015 Document Reviewed: 11/04/2014 Elsevier Interactive Patient Education  2017 [place] Prevention in the Home Falls can cause injuries. They can happen to people of all ages. There are many things you can do to make your home safe and to help prevent falls. What can I do on the outside of my home? Regularly fix the edges of walkways and driveways and fix any cracks. Remove anything that might make you trip as you walk through a door, such as a raised step or threshold. Trim any bushes or trees on the path to your home. Use bright outdoor lighting. Clear any walking paths of anything that might make someone trip, such as rocks or tools. Regularly check to see if handrails are loose or broken. Make sure that both sides of any steps have handrails. Any raised decks and porches should have guardrails on the edges. Have any leaves, snow, or ice cleared regularly. Use sand or salt on walking paths during winter. Clean up any spills in your garage right away. This  includes oil or grease spills. What can I do in the bathroom? Use night lights. Install grab bars by the toilet and in the tub and shower. Do not use towel bars as grab bars. Use non-skid mats or decals in the tub or shower. If you need to sit down in the shower, use a plastic, non-slip stool. Keep the floor dry. Clean up any water that spills on the floor as soon as it happens. Remove soap buildup in the tub or shower regularly. Attach bath mats securely with double-sided non-slip rug tape. Do not have throw rugs and other things on the floor that can make you trip. What can I do in the bedroom? Use night lights. Make sure that you have a light by your bed that is easy to reach. Do not use any sheets or blankets that are too big for your bed. They should not hang down onto the floor. Have a firm chair that has side arms. You can use this for support while you get dressed. Do not have throw rugs and other things on the floor that can make you trip. What can I do in the kitchen? Clean up any spills right away. Avoid walking on wet floors.  Keep items that you use a lot in easy-to-reach places. If you need to reach something above you, use a strong step stool that has a grab bar. Keep electrical cords out of the way. Do not use floor polish or wax that makes floors slippery. If you must use wax, use non-skid floor wax. Do not have throw rugs and other things on the floor that can make you trip. What can I do with my stairs? Do not leave any items on the stairs. Make sure that there are handrails on both sides of the stairs and use them. Fix handrails that are broken or loose. Make sure that handrails are as long as the stairways. Check any carpeting to make sure that it is firmly attached to the stairs. Fix any carpet that is loose or worn. Avoid having throw rugs at the top or bottom of the stairs. If you do have throw rugs, attach them to the floor with carpet tape. Make sure that you  have a light switch at the top of the stairs and the bottom of the stairs. If you do not have them, ask someone to add them for you. What else can I do to help prevent falls? Wear shoes that: Do not have high heels. Have rubber bottoms. Are comfortable and fit you well. Are closed at the toe. Do not wear sandals. If you use a stepladder: Make sure that it is fully opened. Do not climb a closed stepladder. Make sure that both sides of the stepladder are locked into place. Ask someone to hold it for you, if possible. Clearly mark and make sure that you can see: Any grab bars or handrails. First and last steps. Where the edge of each step is. Use tools that help you move around (mobility aids) if they are needed. These include: Canes. Walkers. Scooters. Crutches. Turn on the lights when you go into a dark area. Replace any light bulbs as soon as they burn out. Set up your furniture so you have a clear path. Avoid moving your furniture around. If any of your floors are uneven, fix them. If there are any pets around you, be aware of where they are. Review your medicines with your doctor. Some medicines can make you feel dizzy. This can increase your chance of falling. Ask your doctor what other things that you can do to help prevent falls. This information is not intended to replace advice given to you by your health care provider. Make sure you discuss any questions you have with your health care provider. Document Released: 10/30/2008 Document Revised: 06/11/2015 Document Reviewed: 02/07/2014 Elsevier Interactive Patient Education  2017 [place]. Opioid Pain Medicine Management Opioids are powerful medicines that are used to treat moderate to severe pain. When used for short periods of time, they can help you to: Sleep better. Do better in physical or occupational therapy. Feel better in the first few days after an injury. Recover from surgery. Opioids should be taken with the  supervision of a trained health care provider. They should be taken for the shortest period of time possible. This is because opioids can be addictive, and the longer you take opioids, the greater your risk of addiction. This addiction can also be called opioid use disorder. What are the risks? Using opioid pain medicines for longer than 3 days increases your risk of side effects. Side effects include: Constipation. Nausea and vomiting. Breathing difficulties (respiratory depression). Drowsiness. Confusion. Opioid use disorder. Itching. Taking opioid pain medicine  for a long period of time can affect your ability to do daily tasks. It also puts you at risk for: Motor vehicle crashes. Depression. Suicide. Heart attack. Overdose, which can be life-threatening. What is a pain treatment plan? A pain treatment plan is an agreement between you and your health care provider. Pain is unique to each person, and treatments vary depending on your condition. To manage your pain, you and your health care provider need to work together. To help you do this: Discuss the goals of your treatment, including how much pain you might expect to have and how you will manage the pain. Review the risks and benefits of taking opioid medicines. Remember that a good treatment plan uses more than one approach and minimizes the chance of side effects. Be honest about the amount of medicines you take and about any drug or alcohol use. Get pain medicine prescriptions from only one health care provider. Pain can be managed with many types of alternative treatments. Ask your health care provider to refer you to one or more specialists who can help you manage pain through: Physical or occupational therapy. Counseling (cognitive behavioral therapy). Good nutrition. Biofeedback. Massage. Meditation. Non-opioid medicine. Following a gentle exercise program. How to use opioid pain medicine Taking medicine Take your pain  medicine exactly as told by your health care provider. Take it only when you need it. If your pain gets less severe, you may take less than your prescribed dose if your health care provider approves. If you are not having pain, do nottake pain medicine unless your health care provider tells you to take it. If your pain is severe, do nottry to treat it yourself by taking more pills than instructed on your prescription. Contact your health care provider for help. Write down the times when you take your pain medicine. It is easy to become confused while on pain medicine. Writing the time can help you avoid overdose. Take other over-the-counter or prescription medicines only as told by your health care provider. Keeping yourself and others safe  While you are taking opioid pain medicine: Do not drive, use machinery, or power tools. Do not sign legal documents. Do not drink alcohol. Do not take sleeping pills. Do not supervise children by yourself. Do not do activities that require climbing or being in high places. Do not go to a lake, river, ocean, spa, or swimming pool. Do not share your pain medicine with anyone. Keep pain medicine in a locked cabinet or in a secure area where pets and children cannot reach it. Stopping your use of opioids If you have been taking opioid medicine for more than a few weeks, you may need to slowly decrease (taper) how much you take until you stop completely. Tapering your use of opioids can decrease your risk of symptoms of withdrawal, such as: Pain and cramping in the abdomen. Nausea. Sweating. Sleepiness. Restlessness. Uncontrollable shaking (tremors). Cravings for the medicine. Do not attempt to taper your use of opioids on your own. Talk with your health care provider about how to do this. Your health care provider may prescribe a step-down schedule based on how much medicine you are taking and how long you have been taking it. Getting rid of leftover  pills Do not save any leftover pills. Get rid of leftover pills safely by: Taking the medicine to a prescription take-back program. This is usually offered by the county or law enforcement. Bringing them to a pharmacy that has a drug disposal container.  Flushing them down the toilet. Check the label or package insert of your medicine to see whether this is safe to do. Throwing them out in the trash. Check the label or package insert of your medicine to see whether this is safe to do. If it is safe to throw it out, remove the medicine from the original container, put it into a sealable bag or container, and mix it with used coffee grounds, food scraps, dirt, or cat litter before putting it in the trash. Follow these instructions at home: Activity Do exercises as told by your health care provider. Avoid activities that make your pain worse. Return to your normal activities as told by your health care provider. Ask your health care provider what activities are safe for you. General instructions You may need to take these actions to prevent or treat constipation: Drink enough fluid to keep your urine pale yellow. Take over-the-counter or prescription medicines. Eat foods that are high in fiber, such as beans, whole grains, and fresh fruits and vegetables. Limit foods that are high in fat and processed sugars, such as fried or sweet foods. Keep all follow-up visits. This is important. Where to find support If you have been taking opioids for a long time, you may benefit from receiving support for quitting from a local support group or counselor. Ask your health care provider for a referral to these resources in your area. Where to find more information Centers for Disease Control and Prevention (CDC): http://www.wolf.info/ [place] Food and Drug Administration (FDA): [place] Get help right away if: You may have taken too much of an opioid (overdosed). Common symptoms of an overdose: Your breathing is  slower or more shallow than normal. You have a very slow heartbeat (pulse). You have slurred speech. You have nausea and vomiting. Your pupils become very small. You have other potential symptoms: You are very confused. You faint or feel like you will faint. You have cold, clammy skin. You have blue lips or fingernails. You have thoughts of harming yourself or harming others. These symptoms may represent a serious problem that is an emergency. Do not wait to see if the symptoms will go away. Get medical help right away. Call your local emergency services (911 in the [place] Do not drive yourself to the hospital.  If you ever feel like you may hurt yourself or others, or have thoughts about taking your own life, get help right away. Go to your nearest emergency department or: Call your local emergency services (911 in the [place] Call the [facility] 431-598-1856 in the [place] Call a suicide crisis helpline, such as the [place] at 684-345-8907 or 988 in the [place]. This is open 24 hours a day in the [place] Text the Crisis Text Line at 450-064-0100 (in the [place].). Summary Opioid medicines can help you manage moderate to severe pain for a short period of time. A pain treatment plan is an agreement between you and your health care provider. Discuss the goals of your treatment, including how much pain you might expect to have and how you will manage the pain. If you think that you or someone else may have taken too much of an opioid, get medical help right away. This information is not intended to replace advice given to you by your health care provider. Make sure you discuss any questions you have with your health care provider. Document Revised: 07/29/2020 Document Reviewed: 04/15/2020 Elsevier Patient Education  [place].

## 2021-11-24 LAB — CELIAC DISEASE AB SCREEN W/RFX
Antigliadin Abs, IgA: 15 units (ref 0–19)
IgA/Immunoglobulin A, Serum: 255 mg/dL (ref 64–422)
Transglutaminase IgA: <2 U/mL (ref 0–3)

## 2021-11-29 ENCOUNTER — Encounter: Payer: Self-pay | Admitting: Family

## 2021-12-08 ENCOUNTER — Ambulatory Visit (INDEPENDENT_AMBULATORY_CARE_PROVIDER_SITE_OTHER): Payer: Medicare Other | Admitting: Family

## 2021-12-08 ENCOUNTER — Encounter: Payer: Self-pay | Admitting: Family

## 2021-12-08 VITALS — BP 128/76 | HR 95 | Temp 98.7°F | Wt 196.0 lb

## 2021-12-08 DIAGNOSIS — E538 Deficiency of other specified B group vitamins: Secondary | ICD-10-CM

## 2021-12-08 DIAGNOSIS — E785 Hyperlipidemia, unspecified: Secondary | ICD-10-CM

## 2021-12-08 MED ORDER — PRALUENT 75 MG/ML ~~LOC~~ SOAJ: 75.0000 mg | SUBCUTANEOUS | 2 refills | Status: DC

## 2021-12-08 NOTE — Progress Notes (Signed)
Subjective:    Patient ID: Morgan Hurst, female    DOB: 12-11-1945, 76 y.o.   MRN: [id number]  CC: Morgan Hurst is a 76 y.o. female who presents today for follow up.   HPI: Feels well today.  No new complaints.    B12 deficiency, 197, 3 weeks ago. She does b12 monthly, granddaughter administers.   HLD-  Tried pravastatin 82m, lipitor 284mwith myalgias . She is currently taking zetia 1036making 3days per week,  crestor 60m95mree days/week.    HISTORY:  Past Medical History:  Diagnosis Date   Arthritis    lower back, right hip   B12 deficiency    Back pain    Borderline diabetes    PCP STARTED PT ON METFORMIN IN 2016 DUE TO ELEVATED GLUCOSE    Complication of anesthesia    FIGHTING WHEN WAKING UP FROM ANESTHESIA   Constipation    Diabetes mellitus without complication (HCC)    Endometriosis    Fatty liver    Gallbladder problem    GERD (gastroesophageal reflux disease)    Heart murmur    High cholesterol    Hip pain    Joint pain    Kidney stones    Leg weakness    Orthodontics    top front 3 teeth caps and bridge   Prediabetes    RA (rheumatoid arthritis) (HCC)    SOB (shortness of breath)    Stomach ulcer    Swelling    Vitamin D deficiency    Past Surgical History:  Procedure Laterality Date   ABDOMINAL HYSTERECTOMY  age 28  19NKLE SURGERY     CHOLECYSTECTOMY N/A 04/10/2015   Procedure: LAPAROSCOPIC CHOLECYSTECTOMY WITH INTRAOPERATIVE CHOLANGIOGRAM;  Surgeon: JeffRobert Bellow;  Location: [facility] ORS;  Service: General;  Laterality: N/A;   COLONOSCOPY WITH PROPOFOL N/A 07/10/2017   Procedure: COLONOSCOPY WITH PROPOFOL;  Surgeon: AnnaJonathon Bellows;  [facility];  Service: Gastroenterology;  Laterality: N/A;   ESOPHAGOGASTRODUODENOSCOPY N/A 05/12/2015   Procedure: ESOPHAGOGASTRODUODENOSCOPY (EGD);  Surgeon: PaulHulen Luster;  [facility];  Laterality: N/A;   VAGINAL DELIVERY  2   Family History   Problem Relation Age of Onset   Diabetes Mother    Cancer Mother        colon and breast   Breast cancer Mother    Liver disease Mother    Cancer Father        Leukemia   Cancer Brother        colon and lung   Colon cancer Brother    Cholelithiasis Daughter    Heart disease Paternal Grandfather    Cancer Other 29       colon   Thyroid cancer Neg Hx     Allergies: Patient has no known allergies. Current Outpatient Medications on File Prior to Visit  Medication Sig Dispense Refill   acetaminophen (TYLENOL) 650 MG CR tablet Take 650 mg by mouth every 8 (eight) hours as needed for pain.     benzonatate (TESSALON) 100 MG capsule Take 1 capsule (100 mg total) by mouth 3 (three) times daily as needed for cough. 30 capsule 1   calcium carbonate (OSCAL) 1500 (600 Ca) MG TABS tablet Take 600 mg by mouth daily.     cholecalciferol (VITAMIN D3) 25 MCG (1000 UNIT) tablet Take 2,000 Units by mouth daily.     clotrimazole (LOTRIMIN) 1 % cream Apply 1 Application topically 2 (  two) times daily. 30 g 2   colesevelam (WELCHOL) 625 MG tablet Take 625 mg by mouth daily as needed.     cyanocobalamin (DODEX) 1000 MCG/ML injection INJECT 1ML ONCE PER MONTH 10 mL 2   diclofenac (VOLTAREN) 75 MG EC tablet Take 1 tablet (75 mg total) by mouth 2 (two) times daily. 60 tablet 3   esomeprazole (NEXIUM) 40 MG capsule Take 40 mg by mouth daily as needed.     metolazone (ZAROXOLYN) 5 MG tablet Take 5 mg by mouth daily.     ondansetron (ZOFRAN-ODT) 4 MG disintegrating tablet Take 1 tablet (4 mg total) by mouth every 8 (eight) hours as needed. 20 tablet 0   potassium chloride (KLOR-CON M) 10 MEQ tablet Take 1 tablet by mouth twice daily. **Take with Metolazone.** 180 tablet 1   rOPINIRole (REQUIP) 0.25 MG tablet TAKE 1 TABLET(0.25 MG) BY MOUTH AT BEDTIME AS NEEDED 90 tablet 0   Semaglutide, 1 MG/DOSE, 4 MG/3ML SOPN Inject 1 mg as directed once a week. 3 mL 0   torsemide (DEMADEX) 20 MG tablet Take 20 mg by mouth  daily as needed.     traMADol (ULTRAM) 50 MG tablet TAKE 1 TABLET(50 MG) BY MOUTH DAILY AS NEEDED 30 tablet 0   Vitamin E 400 units TABS Take 400 Units by mouth daily.     No current facility-administered medications on file prior to visit.    Social History   Tobacco Use   Smoking status: Never   Smokeless tobacco: Never  Vaping Use   Vaping Use: Never used  Substance Use Topics   Alcohol use: No   Drug use: No    Review of Systems  Constitutional:  Negative for chills and fever.  Respiratory:  Negative for cough.   Cardiovascular:  Negative for chest pain and palpitations.  Gastrointestinal:  Negative for nausea and vomiting.  Musculoskeletal:  Positive for myalgias.      Objective:    BP 128/76 (BP Location: Left Arm, Patient Position: Sitting, Cuff Size: Normal)   Pulse 95   Temp 98.7 F (37.1 C) (Oral)   Wt 196 lb (88.9 kg)   SpO2 97%   BMI 37.03 kg/m  BP Readings from Last 3 Encounters:  12/08/21 128/76  11/17/21 134/82  08/11/21 120/80   Wt Readings from Last 3 Encounters:  12/08/21 196 lb (88.9 kg)  11/23/21 193 lb (87.5 kg)  11/17/21 193 lb 12.8 oz (87.9 kg)    Physical Exam Vitals reviewed.  Constitutional:      Appearance: She is well-developed.  Eyes:     Conjunctiva/sclera: Conjunctivae normal.  Cardiovascular:     Rate and Rhythm: Normal rate and regular rhythm.     Pulses: Normal pulses.     Heart sounds: Normal heart sounds.  Pulmonary:     Effort: Pulmonary effort is normal.     Breath sounds: Normal breath sounds. No wheezing, rhonchi or rales.  Skin:    General: Skin is warm and dry.  Neurological:     Mental Status: She is alert.  Psychiatric:        Speech: Speech normal.        Behavior: Behavior normal.        Thought Content: Thought content normal.        Assessment & Plan:   Problem List Items Addressed This Visit       Other   B12 deficiency    Discussed with patient that based on lab work she  does not have  pernicious anemia.  Discussed consulting with Dr Vicente Males. However she does have low B12 and recommended continuing B12 injections monthly.  She declined rechecking B12 today; she will do so in  6 weeks.  If remains low likely will start patient on B12 weekly for 4 weeks and then resume monthly.      Relevant Orders   B12 and Folate Panel   Hyperlipidemia - Primary    The 10-year ASCVD risk score (Pearl Berlinger DK, et al., 2019) is: 28.9% Lab Results  Component Value Date   LDLCALC 142 (H) 11/17/2021  Tried pravastatin 62m, lipitor 262mwith myalgias . She is currently taking zetia 1055making 3days per week,  crestor 6m27mree days/week.  We discussed the patient could increase Zetia to daily and remain on Crestor 10 mg 3 times per week however anticipate LDL may not improve dramatically.  We have opted to trial Praluent. Schedule RN visit for teaching, administration.  She will stop Zetia, Crestor.       Relevant Medications   Alirocumab (PRALUENT) 75 MG/ML SOAJ   Other Relevant Orders   Comprehensive metabolic panel     I have discontinued Marialena M. Hartshorn's tiZANidine, rosuvastatin, buPROPion, and ezetimibe. I am also having her start on Praluent. Additionally, I am having her maintain her esomeprazole, colesevelam, cholecalciferol, calcium carbonate, acetaminophen, torsemide, rOPINIRole, metolazone, Vitamin E, Semaglutide (1 MG/DOSE), ondansetron, diclofenac, traMADol, clotrimazole, benzonatate, potassium chloride, and cyanocobalamin.   Meds ordered this encounter  Medications   Alirocumab (PRALUENT) 75 MG/ML SOAJ    Sig: Inject 75 mg into the skin every 14 (fourteen) days.    Dispense:  2 mL    Refill:  2    Order Specific Question:   Supervising Provider    Answer:   TULLCrecencio [id number]]    Return precautions given.   Risks, benefits, and alternatives of the medications and treatment plan prescribed today were discussed, and patient expressed understanding.   Education  regarding symptom management and diagnosis given to patient on AVS.  Continue to follow with ArneBurnard HawthorneP for routine health maintenance.   BettTheodoro Doing I agreed with plan.   MargMable ParisP

## 2021-12-08 NOTE — Assessment & Plan Note (Addendum)
The 10-year ASCVD risk score (Hendry Speas DK, et al., 2019) is: 28.9% Lab Results  Component Value Date   LDLCALC 142 (H) 11/17/2021   Tried pravastatin 27m, lipitor 278mwith myalgias . She is currently taking zetia 1024making 3days per week,  crestor 58m81mree days/week.  We discussed the patient could increase Zetia to daily and remain on Crestor 10 mg 3 times per week however anticipate LDL may not improve dramatically.  We have opted to trial Praluent. Schedule RN visit for teaching, administration.  She will stop Zetia, Crestor.

## 2021-12-08 NOTE — Patient Instructions (Signed)
Start praluent injection for cholesterol Stop zetia and crestor  Schedule nurse visit to administer praluent and bring medication with you to appointment  Happy Holidays!

## 2021-12-08 NOTE — Assessment & Plan Note (Signed)
Discussed with patient that based on lab work she does not have pernicious anemia.  Discussed consulting with Dr Vicente Males. However she does have low B12 and recommended continuing B12 injections monthly.  She declined rechecking B12 today; she will do so in  6 weeks.  If remains low likely will start patient on B12 weekly for 4 weeks and then resume monthly.

## 2021-12-13 ENCOUNTER — Telehealth: Payer: Self-pay

## 2021-12-13 NOTE — Telephone Encounter (Signed)
Received call from [facility] from [facility] asking for update on PA for pt's Alirocumab (PRALUENT) 78 MG/ML SOAJ. Mariah requesting callback on status......callback [phone].

## 2021-12-14 ENCOUNTER — Telehealth: Payer: Self-pay | Admitting: Family

## 2021-12-14 NOTE — Telephone Encounter (Signed)
Pt called stating her insurance will not pay for her nurse visit she had on yesterday. The pharmacy will not give her the medicine. Pt would like to be called back

## 2021-12-14 NOTE — Telephone Encounter (Signed)
Called back to inform Morgan Hurst that the PA had been started and sent to plan just awaiting to hear back to see  if the pt was approved or denied

## 2021-12-14 NOTE — Telephone Encounter (Signed)
Spoke to pt in regards to her message and informed her that PA has been done just waiting to hear back from the insurance whether it will be approved or denied. Pt verbalized understanding

## 2021-12-15 ENCOUNTER — Ambulatory Visit: Payer: Medicare Other

## 2021-12-15 DIAGNOSIS — E1169 Type 2 diabetes mellitus with other specified complication: Secondary | ICD-10-CM | POA: Diagnosis not present

## 2021-12-15 DIAGNOSIS — M159 Polyosteoarthritis, unspecified: Secondary | ICD-10-CM | POA: Diagnosis not present

## 2021-12-15 DIAGNOSIS — E65 Localized adiposity: Secondary | ICD-10-CM | POA: Diagnosis not present

## 2021-12-24 ENCOUNTER — Ambulatory Visit (INDEPENDENT_AMBULATORY_CARE_PROVIDER_SITE_OTHER): Payer: Medicare Other

## 2021-12-24 DIAGNOSIS — E785 Hyperlipidemia, unspecified: Secondary | ICD-10-CM

## 2021-12-24 NOTE — Progress Notes (Signed)
Pt presented for a teaching on how to use the Praluent cholesterol shot. Pt was handed a step by step instruction packet on how to use the single dose pen and each step was explained. Pt was able to successfully administer her fist dose of the Praluent and did not have any further questions at the time.

## 2021-12-26 ENCOUNTER — Telehealth: Payer: Self-pay | Admitting: Pharmacy Technician

## 2021-12-26 DIAGNOSIS — Z596 Low income: Secondary | ICD-10-CM

## 2021-12-26 NOTE — Progress Notes (Signed)
[facility])                                            [place] Team    12/26/2021  Morgan Hurst 12/24/1945 [id number]  Received both patient and provider portion(s) of patient assistance application(s) for Ozempic. Faxed completed application and required documents into [employer].    Elisea Khader P. Suzannah Bettes, [place]  [phone]

## 2022-01-12 DIAGNOSIS — E65 Localized adiposity: Secondary | ICD-10-CM | POA: Diagnosis not present

## 2022-01-12 DIAGNOSIS — R609 Edema, unspecified: Secondary | ICD-10-CM | POA: Diagnosis not present

## 2022-01-12 DIAGNOSIS — K760 Fatty (change of) liver, not elsewhere classified: Secondary | ICD-10-CM | POA: Diagnosis not present

## 2022-01-12 DIAGNOSIS — E1169 Type 2 diabetes mellitus with other specified complication: Secondary | ICD-10-CM | POA: Diagnosis not present

## 2022-01-12 DIAGNOSIS — E782 Mixed hyperlipidemia: Secondary | ICD-10-CM | POA: Diagnosis not present

## 2022-01-14 ENCOUNTER — Telehealth: Payer: Self-pay | Admitting: Family

## 2022-01-14 DIAGNOSIS — U071 COVID-19: Secondary | ICD-10-CM

## 2022-01-14 MED ORDER — MOLNUPIRAVIR EUA 200MG CAPSULE: 4.0000 | ORAL_CAPSULE | Freq: Two times a day (BID) | ORAL | 0 refills | Status: AC

## 2022-01-14 NOTE — Addendum Note (Signed)
Addended by: Burnard Hawthorne on: 01/14/2022 04:12 PM   Modules accepted: Orders

## 2022-01-14 NOTE — Telephone Encounter (Signed)
Spoke to pt and informed her that [place] has been sent in to pharmacy. Pt is aware that info was sent to [facility] as well

## 2022-01-14 NOTE — Telephone Encounter (Addendum)
Pt called stating she tested positive for covid and want medication called in. Pt want medicine to go to [facility] in [employer]

## 2022-01-14 NOTE — Telephone Encounter (Signed)
Call pt   I have sent in anti viral , molnupiravir for her to start as long as symptoms have started within in 5 days.   Please send her below via mychart  You may start PLAIN Mucinex ( guaifenesin) which you can help break up thick congestion.  Please ensure you are drinking plenty of water with this medication.  Please stay in quarantine per cdc guidelines.   If you test positive for COVID-19, stay home for at least 5 days and isolate from others in your home. You are likely most infectious during these first 5 days. Wear a high-quality mask if you must be around others at home and in public. Do not go places where you are unable to wear a mask.   I have sent in  [facility] which is an unapproved drug that is authorized for use under an Emergency Use Authorization.  There are no adequate, approved, available products for the treatment of COVID-19 in adults who have mild-to-moderate COVID-19 and are at high risk for progressing to severe COVID-19, including hospitalization or death.  I have sent  Molnupiravir to your pharmacy. Please call pharmacy so they bring medication out to your car and you do not have to go inside.     COMMON SIDE EFFECTS: Diarrhea Nausea dizziness   If your COVID-19 symptoms get worse, get medical help right away. Call 911 if you experience symptoms such as worsening cough, trouble breathing, chest pain that doesn't go away, confusion, a hard time staying awake, and pale or blue-colored skin.This medication won't prevent all COVID-19 cases from getting worse.   Molnupiravir Oral Capsules What is this medication? MOLNUPIRAVIR (mol nue pir a vir) treats COVID-19. It is an antiviral medication. It may decrease the risk of developing severe symptoms of COVID-19. It may also decrease the chance of going to the hospital. This medication is not approved by the FDA. The FDA has authorized emergency use of thismedication during the COVID-19 pandemic. This medicine may be  used for other purposes; ask your health care provider orpharmacist if you have questions. What should I tell my care team before I take this medication? They need to know if you have any of these conditions: Any allergies Any serious illness An unusual or allergic reaction to molnupiravir, other medications, foods, dyes, or preservatives Pregnant or trying to get pregnant Breast-feeding How should I use this medication? Take this medication by mouth with water. Take it as directed on the prescription label at the same time every day. Do not cut, crush or chew this medication. Swallow the capsules whole. You can take it with or without food. If it upsets your stomach, take it with food. Take all of this medication unless your care team tells you to stop it early. Keep taking it even if youthink you are better. Talk to your care team about the use of this medication in children. Specialcare may be needed. Overdosage: If you think you have taken too much of this medicine contact apoison control center or emergency room at once. NOTE: This medicine is only for you. Do not share this medicine with others. What if I miss a dose? If you miss a dose, take it as soon as you can unless it is more than 10 hours late. If it is more than 10 hours late, skip the missed dose. Take the next dose at the normal time. Do not take extra or 2 doses at the same time to makeup for the missed dose.  What may interact with this medication? Interactions have not been studied. This list may not describe all possible interactions. Give your health care provider a list of all the medicines, herbs, non-prescription drugs, or dietary supplements you use. Also tell them if you smoke, drink alcohol, or use illegaldrugs. Some items may interact with your medicine. What should I watch for while using this medication? Your condition will be monitored carefully while you are receiving this medication. Visit your care team for regular  checkups. Tell your care team ifyour symptoms do not start to get better or if they get worse. Do not become pregnant while taking this medication. You may need a pregnancy test before starting this medication. Women must use a reliable form of birth control while taking this medication and for 4 days after stopping the medication. Women should inform their care team if they wish to become pregnant or think they might be pregnant. Men should not father a child while taking this medication and for 3 months after stopping it. There is potential for serious harm to an unborn child. Talk to your care team for more information. Do not breast-feed an infant while taking this medication and for 4 days afterstopping the medication. What side effects may I notice from receiving this medication? Side effects that you should report to your care team as soon as possible: Allergic reactions-skin rash, itching, hives, swelling of the face, lips, tongue, or throat Side effects that usually do not require medical attention (report these toyour care team if they continue or are bothersome): Diarrhea Dizziness Nausea This list may not describe all possible side effects. Call your doctor for medical advice about side effects. You may report side effects to FDA at1-800-FDA-1088. Where should I keep my medication? Keep out of the reach of children and pets. Store at room temperature between 20 and 25 degrees C (68 and 77 degrees F).Get rid of any unused medication after the expiration date. To get rid of medications that are no longer needed or have expired: Take the medication to a medication take-back program. Check with your pharmacy or law enforcement to find a location. If you cannot return the medication, check the label or package insert to see if the medication should be thrown out in the garbage or flushed down the toilet. If you are not sure, ask your care team. If it is safe to put it in the trash, take the  medication out of the container. Mix the medication with cat litter, dirt, coffee grounds, or other unwanted substance. Seal the mixture in a bag or container. Put it in the trash. NOTE: This sheet is a summary. It may not cover all possible information. If you have questions about this medicine, talk to your doctor, pharmacist, orhealth care provider.  2022 Elsevier/Gold Standard (2020-01-13 16:16:01)

## 2022-01-19 ENCOUNTER — Other Ambulatory Visit: Payer: Medicare Other

## 2022-01-26 ENCOUNTER — Telehealth: Payer: Self-pay | Admitting: Pharmacy Technician

## 2022-01-26 DIAGNOSIS — Z596 Low income: Secondary | ICD-10-CM

## 2022-01-26 NOTE — Progress Notes (Addendum)
[facility])                                            [place] Team    01/26/2022  KALASIA CRAFTON 11-10-1945 [id number]  Care coordination call placed to [place] in regard to [place] application.  Spoke to Opal Sidles who informs patient is APPROVED 01/21/2022-01/17/2023. Medication will ship to prescribing provider's office based on last fill date in 2023. Also, faxed into [employer] the change request form for Ozempic '2mg'$ .  Machael Raine P. Dorothia Passmore, [place]  [phone]

## 2022-02-04 ENCOUNTER — Telehealth: Payer: Self-pay

## 2022-02-04 ENCOUNTER — Telehealth: Payer: Self-pay | Admitting: Family

## 2022-02-04 NOTE — Telephone Encounter (Signed)
Pt picked up pt assistance medication. Ozempic

## 2022-02-04 NOTE — Telephone Encounter (Signed)
PT ASSISTANCE MEDICATION

## 2022-02-07 NOTE — Telephone Encounter (Signed)
close

## 2022-02-08 ENCOUNTER — Other Ambulatory Visit: Payer: Self-pay | Admitting: Family

## 2022-02-08 DIAGNOSIS — M48062 Spinal stenosis, lumbar region with neurogenic claudication: Secondary | ICD-10-CM

## 2022-02-14 DIAGNOSIS — E1169 Type 2 diabetes mellitus with other specified complication: Secondary | ICD-10-CM | POA: Diagnosis not present

## 2022-02-14 DIAGNOSIS — E65 Localized adiposity: Secondary | ICD-10-CM | POA: Diagnosis not present

## 2022-02-14 DIAGNOSIS — E782 Mixed hyperlipidemia: Secondary | ICD-10-CM | POA: Diagnosis not present

## 2022-02-18 ENCOUNTER — Telehealth: Payer: Self-pay

## 2022-02-18 NOTE — Telephone Encounter (Signed)
Pt picked up medication Ozempic (4 boxes) from patient assistance @ 3: 50 pm on 02/18/22

## 2022-02-18 NOTE — Telephone Encounter (Signed)
Spoke to pt and informed her that her Ozempic (4 boxes) from Patient Assistance was here and she can pick it up anytime between 1-5 pm M-F! Pt stated that she would pick up on Mon 02/21/22

## 2022-03-02 ENCOUNTER — Telehealth (INDEPENDENT_AMBULATORY_CARE_PROVIDER_SITE_OTHER): Payer: Medicare Other | Admitting: Family

## 2022-03-02 ENCOUNTER — Telehealth: Payer: Self-pay | Admitting: Family

## 2022-03-02 ENCOUNTER — Encounter: Payer: Self-pay | Admitting: Family

## 2022-03-02 VITALS — BP 120/63 | HR 69

## 2022-03-02 DIAGNOSIS — U071 COVID-19: Secondary | ICD-10-CM

## 2022-03-02 MED ORDER — NIRMATRELVIR/RITONAVIR (PAXLOVID)TABLET: 3.0000 | ORAL_TABLET | Freq: Two times a day (BID) | ORAL | 0 refills | Status: AC

## 2022-03-02 NOTE — Patient Instructions (Signed)
Please stay in quarantine per cdc guidelines.   If you test positive for COVID-19, stay home for at least 5 days and isolate from others in your home. You are likely most infectious during these first 5 days. Wear a high-quality mask if you must be around others at home and in public. Do not go places where you are unable to wear a mask.  We discussed starting Paxlovid which is an unapproved drug that is authorized for use under an Emergency Use Authorization.  There are no adequate, approved, available products for the treatment of COVID-19 in adults who have mild-to-moderate COVID-19 and are at high risk for progressing to severe COVID-19, including hospitalization or death.  There are benefits and risks of taking this treatment as outlined in the "Fact Sheet for Patients and Caregivers." You may find this document here and please read in detail   [email or web address]   I have sent Paxlovid to your pharmacy. Please call pharmacy so they bring medication out to your car and you do not have to go inside.   PAXLOVID ADMINISTRATION INSTRUCTIONS:  Take with or without food. Swallow the tablets whole. Don't chew, crush, or break the medications because it might not work as well  For each dose of the medication, you should be taking 3 tablets together (2 pink oval and 1 white oval) TWICE a day for FIVE days   Finish your full five-day course of Paxlovid even if you feel better before you're done. Stopping this medication too early can make it less effective to prevent severe illness related to [place].    Paxlovid is prescribed for YOU ONLY. Don't share it with others, even if they have similar symptoms as you. This medication might not be right for everyone.  Make sure to take steps to protect yourself and others while you're taking this medication in order to get well soon and to prevent others from getting sick with COVID-19.  Paxlovid (nirmatrelvir / ritonavir) can cause  hormonal birth control medications to not work well. If you or your partner is currently taking hormonal birth control, use condoms or other birth control methods to prevent unintended pregnancies.   COMMON SIDE EFFECTS: Altered or bad taste in your mouth  Diarrhea  High blood pressure (1% of people) Muscle aches (1% of people)    If your COVID-19 symptoms get worse, get medical help right away. Call 911 if you experience symptoms such as worsening cough, trouble breathing, chest pain that doesn't go away, confusion, a hard time staying awake, and pale or blue-colored skin.This medication won't prevent all COVID-19 cases from getting worse.

## 2022-03-02 NOTE — Telephone Encounter (Signed)
Spoke to pt and scheduled her for a mychart video visit today @ 12pm

## 2022-03-02 NOTE — Assessment & Plan Note (Signed)
No acute respiratory distress.Counseled on lacking long term safely and effectiveness data of medication, Paxlovid. Explained EUA for Paxlovid. Criteria met for consideration of Paxlovid,  patient older than 12 years and weight > 40kg, started within 5 days of symptom onset and risk factor for severe disease include: age > 43.   GFR > 60.  Counseled on adverse effects including altered taste, diarrhea, HTN, and myalgia.   Patient is most comfortable and desires to start Paxlovid and understands to call me with concerns or new symptoms.

## 2022-03-02 NOTE — Telephone Encounter (Signed)
Patient called and stated she tested positive for COVID last night. She states that Joycelyn Schmid told her if she ever tested positive for COVID again to call her. Symptoms: body aches, low grade fever, hot and cold.  She also stated that the last time she had COVID the medication prescribed messed up her stomach, is there something else she could take?

## 2022-03-02 NOTE — Progress Notes (Signed)
Virtual Visit via Video Note  I connected with Morgan Hurst on 03/02/22 at 12:00 PM EST by a video enabled telemedicine application and verified that I am speaking with the correct person using two identifiers. Location patient: home Location provider: work  Persons participating in the virtual visit: patient, provider  I discussed the limitations of evaluation and management by telemedicine and the availability of in person appointments. The patient expressed understanding and agreed to proceed.  HPI: Complains of bodyaches,  tactile warmth, chills x 1 day She felt dizzy yesterday morning with chills. She took a nap with improvement.  Endorses mild HA.   No cp, sob, syncope.  She has not had the newest covid booster   Covid positive yesterday, history of COVID 2 months ago, treated with molnupiravir Never smoker H/o GERD, HLD  GFR 84 ROS: See pertinent positives and negatives per HPI.  EXAM:  VITALS per patient if applicable: BP 123456   Pulse 69   SpO2 96%  BP Readings from Last 3 Encounters:  03/02/22 120/63  12/08/21 128/76  11/17/21 134/82   Wt Readings from Last 3 Encounters:  12/08/21 196 lb (88.9 kg)  11/23/21 193 lb (87.5 kg)  11/17/21 193 lb 12.8 oz (87.9 kg)    GENERAL: alert, oriented, appears well and in no acute distress  HEENT: atraumatic, conjunttiva clear, no obvious abnormalities on inspection of external nose and ears  NECK: normal movements of the head and neck  LUNGS: on inspection no signs of respiratory distress, breathing rate appears normal, no obvious gross SOB, gasping or wheezing  CV: no obvious cyanosis  MS: moves all visible extremities without noticeable abnormality  PSYCH/NEURO: pleasant and cooperative, no obvious depression or anxiety, speech and thought processing grossly intact  ASSESSMENT AND PLAN: COVID-19 Assessment & Plan: No acute respiratory distress.Counseled on lacking long term safely and effectiveness data of  medication, Paxlovid. Explained EUA for Paxlovid. Criteria met for consideration of Paxlovid,  patient older than 12 years and weight > 40kg, started within 5 days of symptom onset and risk factor for severe disease include: age > 64.   GFR > 60.  Counseled on adverse effects including altered taste, diarrhea, HTN, and myalgia.   Patient is most comfortable and desires to start Paxlovid and understands to call me with concerns or new symptoms.    Orders: -     nirmatrelvir/ritonavir; Take 3 tablets by mouth 2 (two) times daily for 5 days. (Take nirmatrelvir 150 mg two tablets twice daily for 5 days and ritonavir 100 mg one tablet twice daily for 5 days) Patient GFR is 84  Dispense: 30 tablet; Refill: 0     -we discussed possible serious and likely etiologies, options for evaluation and workup, limitations of telemedicine visit vs in person visit, treatment, treatment risks and precautions. Pt prefers to treat via telemedicine empirically rather then risking or undertaking an in person visit at this moment.    I discussed the assessment and treatment plan with the patient. The patient was provided an opportunity to ask questions and all were answered. The patient agreed with the plan and demonstrated an understanding of the instructions.   The patient was advised to call back or seek an in-person evaluation if the symptoms worsen or if the condition fails to improve as anticipated.  Advised if desired AVS can be mailed or viewed via [facility] if [facility] user.   Mable Paris, FNP

## 2022-03-11 ENCOUNTER — Encounter: Payer: Self-pay | Admitting: Family

## 2022-03-11 ENCOUNTER — Ambulatory Visit (INDEPENDENT_AMBULATORY_CARE_PROVIDER_SITE_OTHER): Payer: Medicare Other | Admitting: Family

## 2022-03-11 ENCOUNTER — Telehealth: Payer: Self-pay | Admitting: Family

## 2022-03-11 VITALS — BP 122/72 | HR 76 | Temp 98.3°F | Ht 61.0 in | Wt 185.0 lb

## 2022-03-11 DIAGNOSIS — M48062 Spinal stenosis, lumbar region with neurogenic claudication: Secondary | ICD-10-CM | POA: Diagnosis not present

## 2022-03-11 DIAGNOSIS — Z1211 Encounter for screening for malignant neoplasm of colon: Secondary | ICD-10-CM | POA: Diagnosis not present

## 2022-03-11 DIAGNOSIS — R600 Localized edema: Secondary | ICD-10-CM | POA: Diagnosis not present

## 2022-03-11 DIAGNOSIS — E669 Obesity, unspecified: Secondary | ICD-10-CM

## 2022-03-11 DIAGNOSIS — E538 Deficiency of other specified B group vitamins: Secondary | ICD-10-CM | POA: Diagnosis not present

## 2022-03-11 DIAGNOSIS — R399 Unspecified symptoms and signs involving the genitourinary system: Secondary | ICD-10-CM

## 2022-03-11 DIAGNOSIS — R252 Cramp and spasm: Secondary | ICD-10-CM

## 2022-03-11 DIAGNOSIS — R350 Frequency of micturition: Secondary | ICD-10-CM | POA: Diagnosis not present

## 2022-03-11 DIAGNOSIS — Z87898 Personal history of other specified conditions: Secondary | ICD-10-CM | POA: Diagnosis not present

## 2022-03-11 DIAGNOSIS — E785 Hyperlipidemia, unspecified: Secondary | ICD-10-CM

## 2022-03-11 LAB — URINALYSIS, ROUTINE W REFLEX MICROSCOPIC
Bilirubin Urine: NEGATIVE
Hgb urine dipstick: NEGATIVE
Ketones, ur: NEGATIVE
Nitrite: NEGATIVE
Specific Gravity, Urine: 1.025 (ref 1.000–1.030)
Total Protein, Urine: NEGATIVE
Urine Glucose: NEGATIVE
Urobilinogen, UA: 0.2 (ref 0.0–1.0)
pH: 6 (ref 5.0–8.0)

## 2022-03-11 LAB — COMPREHENSIVE METABOLIC PANEL
ALT: 16 U/L (ref 0–35)
AST: 17 U/L (ref 0–37)
Albumin: 3.9 g/dL (ref 3.5–5.2)
Alkaline Phosphatase: 80 U/L (ref 39–117)
BUN: 18 mg/dL (ref 6–23)
CO2: 28 mEq/L (ref 19–32)
Calcium: 9.3 mg/dL (ref 8.4–10.5)
Chloride: 106 mEq/L (ref 96–112)
Creatinine, Ser: 0.66 mg/dL (ref 0.40–1.20)
GFR: 85.6 mL/min (ref >60.00)
Glucose, Bld: 86 mg/dL (ref 70–99)
Potassium: 3.9 mEq/L (ref 3.5–5.1)
Sodium: 143 mEq/L (ref 135–145)
Total Bilirubin: 0.3 mg/dL (ref 0.2–1.2)
Total Protein: 6.6 g/dL (ref 6.0–8.3)

## 2022-03-11 LAB — LIPID PANEL
Cholesterol: 138 mg/dL (ref 0–200)
HDL: 50.7 mg/dL (ref >39.00)
LDL Cholesterol: 63 mg/dL (ref 0–99)
NonHDL: 87.45
Total CHOL/HDL Ratio: 3
Triglycerides: 121 mg/dL (ref 0.0–149.0)
VLDL: 24.2 mg/dL (ref 0.0–40.0)

## 2022-03-11 LAB — POCT URINALYSIS DIPSTICK
Bilirubin, UA: NEGATIVE
Glucose, UA: NEGATIVE
Ketones, UA: NEGATIVE
Nitrite, UA: NEGATIVE
Protein, UA: POSITIVE — AB
Spec Grav, UA: >=1.03 — AB (ref 1.010–1.025)
Urobilinogen, UA: 0.2 E.U./dL
pH, UA: 6 (ref 5.0–8.0)

## 2022-03-11 LAB — MICROALBUMIN / CREATININE URINE RATIO
Creatinine,U: 186.6 mg/dL
Microalb Creat Ratio: 0.6 mg/g (ref 0.0–30.0)
Microalb, Ur: 1.2 mg/dL (ref 0.0–1.9)

## 2022-03-11 MED ORDER — ROPINIROLE HCL 0.25 MG PO TABS: ORAL_TABLET | ORAL | 0 refills | Status: DC

## 2022-03-11 MED ORDER — POTASSIUM CHLORIDE CRYS ER 10 MEQ PO TBCR: EXTENDED_RELEASE_TABLET | ORAL | 1 refills | Status: AC

## 2022-03-11 MED ORDER — METOLAZONE 5 MG PO TABS: 5.0000 mg | ORAL_TABLET | Freq: Every day | ORAL | 1 refills | Status: DC | PRN

## 2022-03-11 MED ORDER — CYANOCOBALAMIN 1000 MCG/ML IJ SOLN: INTRAMUSCULAR | 2 refills | Status: DC

## 2022-03-11 MED ORDER — SEMAGLUTIDE (1 MG/DOSE) 4 MG/3ML ~~LOC~~ SOPN: 1.0000 mg | PEN_INJECTOR | SUBCUTANEOUS | 3 refills | Status: DC

## 2022-03-11 MED ORDER — TRAMADOL HCL 50 MG PO TABS: ORAL_TABLET | ORAL | 1 refills | Status: DC

## 2022-03-11 MED ORDER — TORSEMIDE 20 MG PO TABS: 20.0000 mg | ORAL_TABLET | Freq: Every day | ORAL | 0 refills | Status: DC | PRN

## 2022-03-11 NOTE — Assessment & Plan Note (Signed)
Anticipate improved and patient has been compliant with praluent 75 mg.  Pending lipid panel today.

## 2022-03-11 NOTE — Patient Instructions (Signed)
Nice to see you!  Referral for colonoscopy Let us know if you dont hear back within a week in regards to an appointment being scheduled.   So that you are aware, if you are [facility] MyChart user , please pay attention to your MyChart messages as you may receive a MyChart message with a phone number to call and schedule this test/appointment own your own from our referral coordinator. This is a new process so I do not want you to miss this message.  If you are not a MyChart user, you will receive a phone call.

## 2022-03-11 NOTE — Telephone Encounter (Signed)
Microalb being sent out today

## 2022-03-11 NOTE — Progress Notes (Signed)
Assessment & Plan:  UTI symptoms -     POCT urinalysis dipstick -     Urine Culture -     Urinalysis, Routine w reflex microscopic  Screening for colon cancer -     Ambulatory referral to Gastroenterology  Hyperlipidemia, unspecified hyperlipidemia type Assessment & Plan: Anticipate improved and patient has been compliant with praluent 75 mg.  Pending lipid panel today.  Orders: -     Lipid panel  B12 deficiency Assessment & Plan: Compliant with b12 monthly at home. Pending b12 lab today.   Orders: -     Cyanocobalamin; INJECT 1ML ONCE PER MONTH  Dispense: 10 mL; Refill: 2  Lower extremity edema -     Potassium Chloride Crys ER; Take 1 tablet by mouth twice daily. **Take with Metolazone or Torsemide.**  Dispense: 180 tablet; Refill: 1 -     Torsemide; Take 1 tablet (20 mg total) by mouth daily as needed.  Dispense: 90 tablet; Refill: 0 -     metOLazone; Take 1 tablet (5 mg total) by mouth daily as needed.  Dispense: 90 tablet; Refill: 1  Muscle cramp -     rOPINIRole HCl; TAKE 1 TABLET(0.25 MG) BY MOUTH AT BEDTIME AS NEEDED  Dispense: 90 tablet; Refill: 0  Spinal stenosis, lumbar region, with neurogenic claudication -     traMADol HCl; TAKE 1 TABLET(50 MG) BY MOUTH DAILY AS NEEDED  Dispense: 30 tablet; Refill: 1  Obesity (BMI 30-39.9) -     Semaglutide (1 MG/DOSE); Inject 1 mg as directed once a week.  Dispense: 3 mL; Refill: 3  History of prediabetes -     Microalbumin / creatinine urine ratio; Future     Return precautions given.   Risks, benefits, and alternatives of the medications and treatment plan prescribed today were discussed, and patient expressed understanding.   Education regarding symptom management and diagnosis given to patient on AVS either electronically or printed.  Return in about 4 months (around 07/10/2022).  Morgan Paris, FNP  Subjective:    Patient ID: Morgan Hurst, female    DOB: 10-18-1945, 77 y.o.   MRN: [id number]  CC: Morgan Hurst is a 77 y.o. female who presents today for follow up.   HPI: She complains of urinary frequency with occasional pelvic pain with small amounts of urine x 2 weeks.  No dysuria, fever, chills, nausea, flank   Started Praluent at last visit. She is no longer on zetia, crestor.   She is doing once monthly b12 at home.   She requests refills of medications, including tramadol for low back pain as needed.   Allergies: Patient has no known allergies. Current Outpatient Medications on File Prior to Visit  Medication Sig Dispense Refill   acetaminophen (TYLENOL) 650 MG CR tablet Take 650 mg by mouth every 8 (eight) hours as needed for pain.     cholecalciferol (VITAMIN D3) 25 MCG (1000 UNIT) tablet Take 2,000 Units by mouth daily.     colesevelam (WELCHOL) 625 MG tablet Take 625 mg by mouth daily as needed.     diclofenac (VOLTAREN) 75 MG EC tablet Take 1 tablet (75 mg total) by mouth 2 (two) times daily. 60 tablet 3   esomeprazole (NEXIUM) 40 MG capsule Take 40 mg by mouth daily as needed.     ondansetron (ZOFRAN-ODT) 4 MG disintegrating tablet Take 1 tablet (4 mg total) by mouth every 8 (eight) hours as needed. 20 tablet 0   Vitamin E 400 units  TABS Take 400 Units by mouth daily.     No current facility-administered medications on file prior to visit.    Review of Systems  Constitutional:  Negative for chills and fever.  Respiratory:  Negative for cough.   Cardiovascular:  Negative for chest pain and palpitations.  Gastrointestinal:  Negative for nausea and vomiting.  Genitourinary:  Positive for frequency. Negative for dysuria.  Musculoskeletal:  Positive for back pain.      Objective:    BP 122/72   Pulse 76   Temp 98.3 F (36.8 C) (Oral)   Ht '5\' 1"'$  (1.549 m)   Wt 185 lb (83.9 kg)   SpO2 98%   BMI 34.96 kg/m  BP Readings from Last 3 Encounters:  03/11/22 122/72  03/02/22 120/63  12/08/21 128/76   Wt Readings from Last 3 Encounters:  03/11/22 185 lb (83.9 kg)   12/08/21 196 lb (88.9 kg)  11/23/21 193 lb (87.5 kg)    Physical Exam Vitals reviewed.  Constitutional:      Appearance: She is well-developed.  Cardiovascular:     Rate and Rhythm: Normal rate and regular rhythm.     Pulses: Normal pulses.     Heart sounds: Normal heart sounds.  Pulmonary:     Effort: Pulmonary effort is normal.     Breath sounds: Normal breath sounds. No wheezing, rhonchi or rales.  Skin:    General: Skin is warm and dry.  Neurological:     Mental Status: She is alert.  Psychiatric:        Speech: Speech normal.        Behavior: Behavior normal.        Thought Content: Thought content normal.

## 2022-03-11 NOTE — Assessment & Plan Note (Signed)
Compliant with b12 monthly at home. Pending b12 lab today.

## 2022-03-11 NOTE — Telephone Encounter (Signed)
Morgan Hurst, Morgan Hurst pool  Please ensure urine  microalbumin is added onto urine from today

## 2022-03-11 NOTE — Assessment & Plan Note (Addendum)
Trace blood, white blood cells.Patient prefers to wait on antibiotic ahead of urine culture which I think is reasonable.

## 2022-03-14 ENCOUNTER — Other Ambulatory Visit: Payer: Self-pay | Admitting: Family

## 2022-03-14 DIAGNOSIS — N3000 Acute cystitis without hematuria: Secondary | ICD-10-CM

## 2022-03-14 DIAGNOSIS — E1169 Type 2 diabetes mellitus with other specified complication: Secondary | ICD-10-CM | POA: Diagnosis not present

## 2022-03-14 DIAGNOSIS — E782 Mixed hyperlipidemia: Secondary | ICD-10-CM | POA: Diagnosis not present

## 2022-03-14 DIAGNOSIS — E65 Localized adiposity: Secondary | ICD-10-CM | POA: Diagnosis not present

## 2022-03-14 LAB — URINE CULTURE
MICRO NUMBER:: 14607169
SPECIMEN QUALITY:: ADEQUATE

## 2022-03-14 MED ORDER — AMOXICILLIN-POT CLAVULANATE 875-125 MG PO TABS: 1.0000 | ORAL_TABLET | Freq: Two times a day (BID) | ORAL | 0 refills | Status: AC

## 2022-04-13 DIAGNOSIS — R29898 Other symptoms and signs involving the musculoskeletal system: Secondary | ICD-10-CM | POA: Diagnosis not present

## 2022-04-13 DIAGNOSIS — E1169 Type 2 diabetes mellitus with other specified complication: Secondary | ICD-10-CM | POA: Diagnosis not present

## 2022-04-13 DIAGNOSIS — E65 Localized adiposity: Secondary | ICD-10-CM | POA: Diagnosis not present

## 2022-04-19 ENCOUNTER — Telehealth: Payer: Self-pay | Admitting: Gastroenterology

## 2022-04-19 ENCOUNTER — Other Ambulatory Visit: Payer: Self-pay | Admitting: Family

## 2022-04-19 DIAGNOSIS — E785 Hyperlipidemia, unspecified: Secondary | ICD-10-CM

## 2022-04-19 NOTE — Telephone Encounter (Signed)
Patient calling to schedule colonoscopy. Requesting call back.  

## 2022-04-19 NOTE — Telephone Encounter (Signed)
Patient stated that she has decided to go back to Dr. Alice Reichert since he has performed her colonoscopies in the past.  Thanks,  Sharyn Lull, [place]

## 2022-05-09 DIAGNOSIS — E782 Mixed hyperlipidemia: Secondary | ICD-10-CM | POA: Diagnosis not present

## 2022-05-09 DIAGNOSIS — Z79899 Other long term (current) drug therapy: Secondary | ICD-10-CM | POA: Diagnosis not present

## 2022-05-09 DIAGNOSIS — R252 Cramp and spasm: Secondary | ICD-10-CM | POA: Diagnosis not present

## 2022-05-12 ENCOUNTER — Telehealth: Payer: Self-pay

## 2022-05-12 NOTE — Telephone Encounter (Signed)
Pt came in and picked up pt assistance medication Ozempic 5 boxes @ 2:50 pm on 05/12/22

## 2022-05-12 NOTE — Telephone Encounter (Signed)
Spoke to pt and informed her that her pt assistance medication Ozempic (4 boxes) came in and pt can pick it up anytime between 1-5 pm M-F

## 2022-05-18 ENCOUNTER — Ambulatory Visit (INDEPENDENT_AMBULATORY_CARE_PROVIDER_SITE_OTHER): Payer: Medicare Other | Admitting: Family

## 2022-05-18 ENCOUNTER — Encounter: Payer: Self-pay | Admitting: Family

## 2022-05-18 ENCOUNTER — Ambulatory Visit
Admission: RE | Admit: 2022-05-18 | Discharge: 2022-05-18 | Disposition: A | Discharge status: Home / Self Care | Payer: Medicare Other | Attending: Family | Admitting: Family

## 2022-05-18 ENCOUNTER — Ambulatory Visit
Admission: RE | Admit: 2022-05-18 | Discharge: 2022-05-18 | Disposition: A | Discharge status: Home / Self Care | Payer: Medicare Other | Source: Ambulatory Visit | Attending: Family | Admitting: Family

## 2022-05-18 VITALS — BP 110/70 | HR 71 | Temp 97.6°F | Ht 61.0 in | Wt 184.0 lb

## 2022-05-18 DIAGNOSIS — R051 Acute cough: Secondary | ICD-10-CM | POA: Diagnosis not present

## 2022-05-18 DIAGNOSIS — J40 Bronchitis, not specified as acute or chronic: Secondary | ICD-10-CM | POA: Insufficient documentation

## 2022-05-18 DIAGNOSIS — E785 Hyperlipidemia, unspecified: Secondary | ICD-10-CM

## 2022-05-18 DIAGNOSIS — R059 Cough, unspecified: Secondary | ICD-10-CM | POA: Diagnosis not present

## 2022-05-18 DIAGNOSIS — Z1231 Encounter for screening mammogram for malignant neoplasm of breast: Secondary | ICD-10-CM

## 2022-05-18 LAB — POCT INFLUENZA A/B
Influenza A, POC: NEGATIVE
Influenza B, POC: NEGATIVE

## 2022-05-18 LAB — POC COVID19 BINAXNOW: SARS Coronavirus 2 Ag: NEGATIVE

## 2022-05-18 MED ORDER — BENZONATATE 100 MG PO CAPS: 100.0000 mg | ORAL_CAPSULE | Freq: Three times a day (TID) | ORAL | 0 refills | Status: DC | PRN

## 2022-05-18 MED ORDER — LORATADINE 10 MG PO TABS: 10.0000 mg | ORAL_TABLET | Freq: Every day | ORAL | 11 refills | Status: DC

## 2022-05-18 MED ORDER — IPRATROPIUM-ALBUTEROL 0.5-2.5 (3) MG/3ML IN SOLN
3.0000 mL | Freq: Once | RESPIRATORY_TRACT | Status: AC
Start: 2022-05-18 — End: ?

## 2022-05-18 MED ORDER — ALBUTEROL SULFATE HFA 108 (90 BASE) MCG/ACT IN AERS
2.0000 | INHALATION_SPRAY | Freq: Four times a day (QID) | RESPIRATORY_TRACT | 1 refills | Status: DC | PRN
Start: 2022-05-18 — End: 2022-08-22

## 2022-05-18 MED ORDER — PREDNISONE 10 MG PO TABS
ORAL_TABLET | ORAL | 0 refills | Status: DC
Start: 2022-05-18 — End: 2022-08-22

## 2022-05-18 MED ORDER — PRALUENT 75 MG/ML ~~LOC~~ SOAJ: SUBCUTANEOUS | 3 refills | Status: DC

## 2022-05-18 NOTE — Patient Instructions (Addendum)
Stop amoxicillin as I do not think at this point cough is bacterial.  Certainly let me know if cough persists  Start Claritin which is an antihistamine  use albuterol every 6 hours for first 24 hours to get good medication into the lungs and loosen congestion; after, you may use as needed and eventually stop all together when cough resolves.  Start prednisone and ensure you get all doses then prior to noon each day as prednisone can interfere with sleep  I have sent in [facility] for you to use as needed.

## 2022-05-18 NOTE — Assessment & Plan Note (Addendum)
No acute respiratory distress.  Based on duration 1 day suspect more likely viral versus allergic in etiology.  Coarse lung sounds on exam.  Patient has taken 1 dose of amoxicillin 875 mg which she had at home.  I have advised her to stop amoxicillin at this point.  DuoNeb provided. Start prednisone, albuterol as needed, Tessalon as needed, Claritin.  Pending CXR to ensure no PNA.   Patient will let me know how she is doing.

## 2022-05-18 NOTE — Progress Notes (Signed)
Assessment & Plan:  Bronchitis Assessment & Plan: No acute respiratory distress.  Based on duration 1 day suspect more likely viral versus allergic in etiology.  Coarse lung sounds on exam.  Patient has taken 1 dose of amoxicillin 875 mg which she had at home.  I have advised her to stop amoxicillin at this point.  DuoNeb provided. Start prednisone, albuterol as needed, Tessalon as needed, Claritin.  Pending CXR to ensure no PNA.   Patient will let me know how she is doing.   Orders: -     Benzonatate; Take 1 capsule (100 mg total) by mouth 3 (three) times daily as needed for cough.  Dispense: 20 capsule; Refill: 0 -     predniSONE; Take 40 mg by mouth on day 1, then taper 10 mg daily until gone  Dispense: 10 tablet; Refill: 0 -     Loratadine; Take 1 tablet (10 mg total) by mouth daily.  Dispense: 30 tablet; Refill: 11 -     Albuterol Sulfate HFA; Inhale 2 puffs into the lungs every 6 (six) hours as needed for wheezing or shortness of breath.  Dispense: 8 g; Refill: 1 -     Ipratropium-Albuterol -     DG Chest 2 View; Future  Encounter for screening mammogram for malignant neoplasm of breast -     3D Screening Mammogram, Left and Right; Future  Acute cough -     POCT Influenza A/B -     POC COVID-19 BinaxNow  Hyperlipidemia, unspecified hyperlipidemia type -     Praluent; ADMINISTER 1 ML UNDER THE SKIN EVERY 14 DAYS  Dispense: 2 mL; Refill: 3     Return precautions given.   Risks, benefits, and alternatives of the medications and treatment plan prescribed today were discussed, and patient expressed understanding.   Education regarding symptom management and diagnosis given to patient on AVS either electronically or printed.  Return in about 3 months (around 08/18/2022).  Rennie Plowman, FNP  Subjective:    Patient ID: Morgan Hurst, female    DOB: 05/10/1945, 77 y.o.   MRN: [id number]  CC: Morgan Hurst is a 77 y.o. female who presents today for an acute visit.     HPI: Complains of cough, congestion x one day Started yesterday with sneezing, eyes watering She coughed through the night last night as 'stream of running nose.' She vomited twice non bloody bile 5am this morning  Endorses HA when she is coughing. She heard a rattle last night in her chest and was worried she was wheezing.   She started old odse of amoxicillin 875mg  and also tessalon 100mg  with relief.    No fever, diarrhea, constipation, dysuria, CP, sob.   She had been at the beach 3 days ago. She has not worked in the yard.  No h/o seasonal allergies  She has been drinking green tea    Never smoker   Follow-up [facility] gastroenterology 08/11/2022 Allergies: Patient has no known allergies. Current Outpatient Medications on File Prior to Visit  Medication Sig Dispense Refill   acetaminophen (TYLENOL) 650 MG CR tablet Take 650 mg by mouth every 8 (eight) hours as needed for pain.     cholecalciferol (VITAMIN D3) 25 MCG (1000 UNIT) tablet Take 2,000 Units by mouth daily.     colesevelam (WELCHOL) 625 MG tablet Take 625 mg by mouth daily as needed.     cyanocobalamin (DODEX) 1000 MCG/ML injection INJECT ONCE PER MONTH 10 mL 2   diclofenac (  VOLTAREN) 75 MG EC tablet Take 1 tablet (75 mg total) by mouth 2 (two) times daily. 60 tablet 3   esomeprazole (NEXIUM) 40 MG capsule Take 40 mg by mouth daily as needed.     metolazone (ZAROXOLYN) 5 MG tablet Take 1 tablet (5 mg total) by mouth daily as needed. 90 tablet 1   ondansetron (ZOFRAN-ODT) 4 MG disintegrating tablet Take 1 tablet (4 mg total) by mouth every 8 (eight) hours as needed. 20 tablet 0   potassium chloride (KLOR-CON M) 10 MEQ tablet Take 1 tablet by mouth twice daily. **Take with Metolazone or Torsemide.** 180 tablet 1   rOPINIRole (REQUIP) 0.25 MG tablet TAKE 1 TABLET(0.25 MG) BY MOUTH AT BEDTIME AS NEEDED 90 tablet 0   Semaglutide, 1 MG/DOSE, 4 MG/3ML SOPN Inject 1 mg as directed once a week. 3 mL 3   torsemide  (DEMADEX) 20 MG tablet Take 1 tablet (20 mg total) by mouth daily as needed. 90 tablet 0   traMADol (ULTRAM) 50 MG tablet TAKE 1 TABLET(50 MG) BY MOUTH DAILY AS NEEDED 30 tablet 1   Vitamin E 400 units TABS Take 400 Units by mouth daily.     No current facility-administered medications on file prior to visit.    Review of Systems  Constitutional:  Negative for chills and fever.  HENT:  Positive for congestion and postnasal drip. Negative for sinus pain.   Eyes:  Negative for visual disturbance.  Respiratory:  Positive for cough and wheezing. Negative for shortness of breath.   Cardiovascular:  Negative for chest pain, palpitations and leg swelling.  Gastrointestinal:  Negative for nausea and vomiting.  Neurological:  Positive for headaches.      Objective:    BP 110/70   Pulse 71   Temp 97.6 F (36.4 C) (Oral)   Ht 5\' 1"  (1.549 m)   Wt 184 lb (83.5 kg)   SpO2 95%   BMI 34.77 kg/m   BP Readings from Last 3 Encounters:  05/18/22 110/70  03/11/22 122/72  03/02/22 120/63   Wt Readings from Last 3 Encounters:  05/18/22 184 lb (83.5 kg)  03/11/22 185 lb (83.9 kg)  12/08/21 196 lb (88.9 kg)    Physical Exam Vitals reviewed.  Constitutional:      Appearance: She is well-developed.  HENT:     Head: Normocephalic and atraumatic.     Right Ear: Hearing, tympanic membrane, ear canal and external ear normal. No decreased hearing noted. No drainage, swelling or tenderness. No middle ear effusion. No foreign body. Tympanic membrane is not erythematous or bulging.     Left Ear: Hearing, tympanic membrane, ear canal and external ear normal. No decreased hearing noted. No drainage, swelling or tenderness.  No middle ear effusion. No foreign body. Tympanic membrane is not erythematous or bulging.     Nose: Nose normal. No rhinorrhea.     Right Sinus: No maxillary sinus tenderness or frontal sinus tenderness.     Left Sinus: No maxillary sinus tenderness or frontal sinus tenderness.      Mouth/Throat:     Pharynx: Uvula midline. Posterior oropharyngeal erythema present. No oropharyngeal exudate.     Tonsils: No tonsillar abscesses.  Eyes:     Conjunctiva/sclera: Conjunctivae normal.  Cardiovascular:     Rate and Rhythm: Regular rhythm.     Pulses: Normal pulses.     Heart sounds: Normal heart sounds.  Pulmonary:     Effort: Pulmonary effort is normal.     Breath sounds: Normal  breath sounds. No wheezing, rhonchi or rales.     Comments: Coarse lung sounds throughout lung fields.  No wheezing on exam Musculoskeletal:     Right lower leg: No edema.     Left lower leg: No edema.  Lymphadenopathy:     Head:     Right side of head: No submental, submandibular, tonsillar, preauricular, posterior auricular or occipital adenopathy.     Left side of head: No submental, submandibular, tonsillar, preauricular, posterior auricular or occipital adenopathy.     Cervical: No cervical adenopathy.  Skin:    General: Skin is warm and dry.  Neurological:     Mental Status: She is alert.  Psychiatric:        Speech: Speech normal.        Behavior: Behavior normal.        Thought Content: Thought content normal.   Lung sounds  increased after nebulizer, remain coarse.

## 2022-05-19 ENCOUNTER — Telehealth: Payer: Self-pay | Admitting: Family

## 2022-05-19 NOTE — Telephone Encounter (Signed)
Spoke to pt and she stated that she does not feel any better today and would like to know the outcome of her xrays. I explained to her that as soon as they resulted that either myself or the provider would contact her.

## 2022-05-19 NOTE — Telephone Encounter (Signed)
Pt called wanting to know about her xray results

## 2022-05-26 NOTE — Telephone Encounter (Signed)
I spoke with pt about her mammo to sch, pt states she would like a call to get her xray results.  Please call pt @ [phone]. Thank you!

## 2022-05-26 NOTE — Telephone Encounter (Signed)
Spoke to pt and gave her xray results pt stated that she is still coughing and would like something else sent in to pharmacy

## 2022-05-27 ENCOUNTER — Other Ambulatory Visit: Payer: Self-pay | Admitting: Family

## 2022-05-27 DIAGNOSIS — J40 Bronchitis, not specified as acute or chronic: Secondary | ICD-10-CM

## 2022-05-27 MED ORDER — AZITHROMYCIN 250 MG PO TABS
ORAL_TABLET | ORAL | 0 refills | Status: AC
Start: 2022-05-27 — End: 2022-06-01

## 2022-05-27 NOTE — Telephone Encounter (Signed)
LVM to inform pt that Claris Che has  sent an antibiotic, azithromycin Ensure to take probiotics while on antibiotics and also for 2 weeks after completion. This can either be by eating yogurt daily or taking a probiotic supplement over the counter such as Culturelle.It is important to re-colonize the gut with good bacteria and also to prevent any diarrheal infections associated with antibiotic use.

## 2022-05-27 NOTE — Telephone Encounter (Signed)
Call patient If still coughing after prednisone,  I have sent an antibiotic, azithromycin Ensure to take probiotics while on antibiotics and also for 2 weeks after completion. This can either be by eating yogurt daily or taking a probiotic supplement over the counter such as Culturelle.It is important to re-colonize the gut with good bacteria and also to prevent any diarrheal infections associated with antibiotic use.

## 2022-06-21 DIAGNOSIS — E1169 Type 2 diabetes mellitus with other specified complication: Secondary | ICD-10-CM | POA: Diagnosis not present

## 2022-06-21 DIAGNOSIS — J40 Bronchitis, not specified as acute or chronic: Secondary | ICD-10-CM | POA: Diagnosis not present

## 2022-06-21 DIAGNOSIS — E782 Mixed hyperlipidemia: Secondary | ICD-10-CM | POA: Diagnosis not present

## 2022-07-11 ENCOUNTER — Ambulatory Visit: Payer: Medicare Other | Admitting: Family

## 2022-07-19 DIAGNOSIS — E782 Mixed hyperlipidemia: Secondary | ICD-10-CM | POA: Diagnosis not present

## 2022-07-19 DIAGNOSIS — E65 Localized adiposity: Secondary | ICD-10-CM | POA: Diagnosis not present

## 2022-07-19 DIAGNOSIS — E1169 Type 2 diabetes mellitus with other specified complication: Secondary | ICD-10-CM | POA: Diagnosis not present

## 2022-07-20 ENCOUNTER — Telehealth: Payer: Self-pay

## 2022-07-20 NOTE — Telephone Encounter (Signed)
Thanks

## 2022-07-20 NOTE — Telephone Encounter (Signed)
Pt picked up medication.

## 2022-07-20 NOTE — Telephone Encounter (Signed)
Patient states she is returning our call.  I spoke with Jenate [place], CMA, and she states she called patient to let her know that her Ozempic is ready for pick-up.  Patient states she will come pick it up today.

## 2022-08-11 DIAGNOSIS — K219 Gastro-esophageal reflux disease without esophagitis: Secondary | ICD-10-CM | POA: Diagnosis not present

## 2022-08-11 DIAGNOSIS — Z9049 Acquired absence of other specified parts of digestive tract: Secondary | ICD-10-CM | POA: Diagnosis not present

## 2022-08-11 DIAGNOSIS — Z800 Family history of malignant neoplasm of digestive organs: Secondary | ICD-10-CM | POA: Diagnosis not present

## 2022-08-11 DIAGNOSIS — K760 Fatty (change of) liver, not elsewhere classified: Secondary | ICD-10-CM | POA: Diagnosis not present

## 2022-08-16 DIAGNOSIS — E1169 Type 2 diabetes mellitus with other specified complication: Secondary | ICD-10-CM | POA: Diagnosis not present

## 2022-08-16 DIAGNOSIS — K760 Fatty (change of) liver, not elsewhere classified: Secondary | ICD-10-CM | POA: Diagnosis not present

## 2022-08-22 ENCOUNTER — Encounter: Payer: Self-pay | Admitting: Family

## 2022-08-22 ENCOUNTER — Ambulatory Visit (INDEPENDENT_AMBULATORY_CARE_PROVIDER_SITE_OTHER): Payer: Medicare Other | Admitting: Family

## 2022-08-22 VITALS — BP 110/70 | HR 79 | Temp 97.8°F | Ht 61.5 in | Wt 177.0 lb

## 2022-08-22 DIAGNOSIS — B372 Candidiasis of skin and nail: Secondary | ICD-10-CM | POA: Diagnosis not present

## 2022-08-22 MED ORDER — CLOTRIMAZOLE 1 % EX CREA: 1.0000 | TOPICAL_CREAM | Freq: Two times a day (BID) | CUTANEOUS | 1 refills | Status: DC

## 2022-08-22 MED ORDER — FLUCONAZOLE 150 MG PO TABS
150.0000 mg | ORAL_TABLET | Freq: Once | ORAL | 1 refills | Status: AC
Start: 2022-08-22 — End: 2022-08-22

## 2022-08-22 NOTE — Assessment & Plan Note (Addendum)
Presentation c/w intertrigo candida. Start diflucan, clotrimazole.  Concern for scaly lesion on hand SCC versus precancer versus scar/trauma I advised not to use any over-the-counter medication over the lesion to await dermatology evaluation.  Referral placed today.

## 2022-08-22 NOTE — Progress Notes (Signed)
Assessment & Plan:  Candidal intertrigo Assessment & Plan: Presentation c/w intertrigo candida. Start diflucan, clotrimazole.  Concern for scaly lesion on hand SCC versus precancer versus scar/trauma I advised not to use any over-the-counter medication over the lesion to await dermatology evaluation.  Referral placed today.  Orders: -     Clotrimazole; Apply 1 Application topically 2 (two) times daily.  Dispense: 30 g; Refill: 1 -     Fluconazole; Take 1 tablet (150 mg total) by mouth once for 1 dose. Take one tablet PO once. If sxs persist, may take one tablet PO 3 days later.  Dispense: 2 tablet; Refill: 1 -     Ambulatory referral to Dermatology     Return precautions given.   Risks, benefits, and alternatives of the medications and treatment plan prescribed today were discussed, and patient expressed understanding.   Education regarding symptom management and diagnosis given to patient on AVS either electronically or printed.  Return in about 3 months (around 11/22/2022).  Rennie Plowman, FNP  Subjective:    Patient ID: Morgan Hurst, female    DOB: September 06, 1945, 77 y.o.   MRN: [id number]  CC: Morgan Hurst is a 77 y.o. female who presents today for follow up.   HPI: Complains if rash in the 'bends' of groin bilaterally, waxes and wanes. The skin cracks. Resolves with neosporin.   Area is not itching. Worse in warmer months.   She is keeping the area dry.   She was a urinary pad when she is out on appointment.   Complains of scratchy nonprurtic lesion right hand. Lesion is not bleeding She has used triamcinolone on it in the past.  No history of SCC, BCC.     Colonoscopy is scheduled Allergies: Patient has no known allergies. Current Outpatient Medications on File Prior to Visit  Medication Sig Dispense Refill   acetaminophen (TYLENOL) 650 MG CR tablet Take 650 mg by mouth every 8 (eight) hours as needed for pain.     Alirocumab (PRALUENT) 75 MG/ML SOAJ  ADMINISTER 1 ML UNDER THE SKIN EVERY 14 DAYS 2 mL 3   cholecalciferol (VITAMIN D3) 25 MCG (1000 UNIT) tablet Take 2,000 Units by mouth daily.     cyanocobalamin (DODEX) 1000 MCG/ML injection INJECT ONCE PER MONTH 10 mL 2   diclofenac (VOLTAREN) 75 MG EC tablet Take 1 tablet (75 mg total) by mouth 2 (two) times daily. 60 tablet 3   esomeprazole (NEXIUM) 40 MG capsule Take 40 mg by mouth daily as needed.     metolazone (ZAROXOLYN) 5 MG tablet Take 1 tablet (5 mg total) by mouth daily as needed. 90 tablet 1   ondansetron (ZOFRAN-ODT) 4 MG disintegrating tablet Take 1 tablet (4 mg total) by mouth every 8 (eight) hours as needed. 20 tablet 0   potassium chloride (KLOR-CON M) 10 MEQ tablet Take 1 tablet by mouth twice daily. **Take with Metolazone or Torsemide.** 180 tablet 1   rOPINIRole (REQUIP) 0.25 MG tablet TAKE 1 TABLET(0.25 MG) BY MOUTH AT BEDTIME AS NEEDED 90 tablet 0   Semaglutide, 1 MG/DOSE, 4 MG/3ML SOPN Inject 1 mg as directed once a week. (Patient taking differently: Inject 1 mg as directed once a week. PATIENT IS TAKING 2 MG WEEKLY INSTEAD OF 1 MG) 3 mL 3   torsemide (DEMADEX) 20 MG tablet Take 1 tablet (20 mg total) by mouth daily as needed. 90 tablet 0   traMADol (ULTRAM) 50 MG tablet TAKE 1 TABLET(50 MG) BY MOUTH DAILY  AS NEEDED 30 tablet 1   Vitamin E 400 units TABS Take 400 Units by mouth daily.     Current Facility-Administered Medications on File Prior to Visit  Medication Dose Route Frequency Provider Last Rate Last Admin   ipratropium-albuterol (DUONEB) 0.5-2.5 (3) MG/3ML nebulizer solution 3 mL  3 mL Nebulization Once Allegra Grana, FNP        Review of Systems  Constitutional:  Negative for chills and fever.  Respiratory:  Negative for cough.   Cardiovascular:  Negative for chest pain and palpitations.  Gastrointestinal:  Negative for nausea and vomiting.  Skin:  Negative for rash and wound.      Objective:    BP 110/70   Pulse 79   Temp 97.8 F (36.6 C)  (Oral)   Ht 5' 1.5" (1.562 m)   Wt 177 lb (80.3 kg)   SpO2 98%   BMI 32.90 kg/m  BP Readings from Last 3 Encounters:  08/22/22 110/70  05/18/22 110/70  03/11/22 122/72   Wt Readings from Last 3 Encounters:  08/22/22 177 lb (80.3 kg)  05/18/22 184 lb (83.5 kg)  03/11/22 185 lb (83.9 kg)    Physical Exam Vitals reviewed.  Constitutional:      Appearance: She is well-developed.  Eyes:     Conjunctiva/sclera: Conjunctivae normal.  Cardiovascular:     Rate and Rhythm: Normal rate and regular rhythm.     Pulses: Normal pulses.     Heart sounds: Normal heart sounds.  Pulmonary:     Effort: Pulmonary effort is normal.     Breath sounds: Normal breath sounds. No wheezing, rhonchi or rales.  Skin:    General: Skin is warm and dry.     Comments: Erythematous macules , confluent under pannus  Neurological:     Mental Status: She is alert.  Psychiatric:        Speech: Speech normal.        Behavior: Behavior normal.        Thought Content: Thought content normal.     2 cm raised rough lesion right dorsal hand

## 2022-08-22 NOTE — Patient Instructions (Addendum)
Referral to dermatology. I am concerned in regards to precancer and need for biopsy.   Let us know if you dont hear back within a week in regards to an appointment being scheduled and most certainly if the appointment is further out.  I would like for you to be seen promptly.  So that you are aware, if you are [facility] MyChart user , please pay attention to your MyChart messages as you may receive a MyChart message with a phone number to call and schedule this test/appointment own your own from our referral coordinator. This is a new process so I do not want you to miss this message.  If you are not a MyChart user, you will receive a phone call.   I suspect you have yeast infection under skin fold. This is called intertrigo candidiasis  Please use topical clotrimazole ointment which is an antifungal and follow the instructions below.   Daily cleansing of intertriginous skin with a mild cleanser followed by drying of affected area with a hair dryer on a cool setting  ?Aeration of affected area when feasible and drying after shower. You may even use blow dryer on COOL setting after showers, baths to dry area  ?Daily application of drying powders, such as powders composed of microporous cellulose  ?Use of absorbent material or clothing, such as cotton or merino wool, to separate skin in folds  ?Application of barrier creams such as zinc oxide in areas that may come in contact with urine or feces  Intertrigo Intertrigo is skin irritation (inflammation) that happens in warm, moist areas of the body. The irritation can cause a rash and make skin raw and itchy. The rash is usually pink or red. It happens mostly between folds of skin or where skin rubs together, such as: Between the toes. In the armpits. In the groin area. Under the belly. Under the breasts. Around the butt area. This condition is not passed from person to person (is not contagious). What are the causes? Heat, moisture, rubbing,  and not enough air movement. The condition can be made worse by: Sweat. Bacteria. A fungus, such as yeast. What increases the risk? Moisture in your skin folds. You are more likely to develop this condition if you: Have diabetes. Are overweight. Are not able to move around. Live in a warm and moist climate. Wear splints, braces, or other medical devices. Are not able to control your pee (urine) or poop (stool). What are the signs or symptoms? A pink or red skin rash in the skin fold or near the skin fold. Raw or scaly skin. Itching. A burning feeling. Bleeding. Leaking fluid. A bad smell. How is this treated? Cleaning and drying your skin. Taking an antibiotic medicine or using an antibiotic skin cream for a bacterial infection. Using an antifungal cream on your skin or taking pills for an infection that was caused by a fungus, such as yeast. Using a steroid ointment to stop the itching and irritation. Separating the skin fold with a clean cotton cloth to absorb moisture and allow air to flow into the area. Follow these instructions at home: Keep the affected area clean and dry. Do not scratch your skin. Stay cool as much as you can. Use an air conditioner or a fan, if you have one. Apply over-the-counter and prescription medicines only as told by your doctor. If you were prescribed an antibiotic medicine, use it as told by your doctor. Do not stop using the antibiotic even if your  condition starts to get better. Keep all follow-up visits as told by your doctor. This is important. How is this prevented?  Stay at a healthy weight. Take care of your feet. This is very important if you have diabetes. You should: Wear shoes that fit well. Keep your feet dry. Wear clean cotton or wool socks. Protect the skin in your groin and butt area as told by your doctor. To do this: Follow a regular cleaning routine. Use creams, powders, or ointments that protect your skin. Change  protection pads often. Do not wear tight clothes. Wear clothes that: Are loose. Take moisture away from your body. Are made of cotton. Wear a bra that gives good support, if needed. Shower and dry yourself well after being active. Use a hair dryer on a cool setting to dry between skin folds. Keep your blood sugar under control if you have diabetes. Contact a doctor if: Your symptoms do not get better with treatment. Your symptoms get worse or they spread. You notice more redness and warmth. You have a fever. Summary Intertrigo is skin irritation that occurs when folds of skin rub together. This condition is caused by heat, moisture, and rubbing. This condition may be treated by cleaning and drying your skin and with medicines. Apply over-the-counter and prescription medicines only as told by your doctor. Keep all follow-up visits as told by your doctor. This is important. This information is not intended to replace advice given to you by your health care provider. Make sure you discuss any questions you have with your health care provider. Document Revised: 10/12/2017 Document Reviewed: 10/12/2017 Elsevier Patient Education  2022 [employer].

## 2022-09-12 ENCOUNTER — Ambulatory Visit: Payer: Medicare Other

## 2022-09-12 DIAGNOSIS — Z800 Family history of malignant neoplasm of digestive organs: Secondary | ICD-10-CM | POA: Diagnosis not present

## 2022-09-12 DIAGNOSIS — K640 First degree hemorrhoids: Secondary | ICD-10-CM | POA: Diagnosis not present

## 2022-09-12 DIAGNOSIS — Z1211 Encounter for screening for malignant neoplasm of colon: Secondary | ICD-10-CM | POA: Diagnosis not present

## 2022-09-12 DIAGNOSIS — K5730 Diverticulosis of large intestine without perforation or abscess without bleeding: Secondary | ICD-10-CM | POA: Diagnosis not present

## 2022-09-12 DIAGNOSIS — Z86010 Personal history of colonic polyps: Secondary | ICD-10-CM | POA: Diagnosis not present

## 2022-09-12 DIAGNOSIS — K648 Other hemorrhoids: Secondary | ICD-10-CM | POA: Diagnosis not present

## 2022-09-13 DIAGNOSIS — K529 Noninfective gastroenteritis and colitis, unspecified: Secondary | ICD-10-CM | POA: Diagnosis not present

## 2022-09-13 DIAGNOSIS — E1169 Type 2 diabetes mellitus with other specified complication: Secondary | ICD-10-CM | POA: Diagnosis not present

## 2022-09-13 DIAGNOSIS — K760 Fatty (change of) liver, not elsewhere classified: Secondary | ICD-10-CM | POA: Diagnosis not present

## 2022-09-28 ENCOUNTER — Other Ambulatory Visit: Payer: Self-pay | Admitting: Family

## 2022-09-28 DIAGNOSIS — R600 Localized edema: Secondary | ICD-10-CM

## 2022-10-11 ENCOUNTER — Ambulatory Visit: Payer: Medicare Other | Admitting: Dermatology

## 2022-10-12 DIAGNOSIS — Z9049 Acquired absence of other specified parts of digestive tract: Secondary | ICD-10-CM | POA: Diagnosis not present

## 2022-10-12 DIAGNOSIS — E1169 Type 2 diabetes mellitus with other specified complication: Secondary | ICD-10-CM | POA: Diagnosis not present

## 2022-10-12 DIAGNOSIS — E65 Localized adiposity: Secondary | ICD-10-CM | POA: Diagnosis not present

## 2022-10-18 ENCOUNTER — Telehealth: Payer: Self-pay

## 2022-10-18 NOTE — Telephone Encounter (Signed)
LVM to let pt know her Pt Assistance medication, Ozempic (4 boxes) came in and she can pick up anytime M-F from 9am -5 pm

## 2022-10-19 ENCOUNTER — Telehealth: Payer: Self-pay

## 2022-10-19 NOTE — Telephone Encounter (Signed)
Pt came in to pickup pt assistance medication Ozempic ( 4boxes) @ 12 pm

## 2022-11-09 DIAGNOSIS — E1169 Type 2 diabetes mellitus with other specified complication: Secondary | ICD-10-CM | POA: Diagnosis not present

## 2022-11-09 DIAGNOSIS — E65 Localized adiposity: Secondary | ICD-10-CM | POA: Diagnosis not present

## 2022-11-29 ENCOUNTER — Ambulatory Visit: Payer: Medicare Other | Admitting: *Deleted

## 2022-11-29 VITALS — Ht 61.5 in | Wt 180.0 lb

## 2022-11-29 DIAGNOSIS — Z780 Asymptomatic menopausal state: Secondary | ICD-10-CM

## 2022-11-29 DIAGNOSIS — Z0000 Encounter for general adult medical examination without abnormal findings: Secondary | ICD-10-CM | POA: Diagnosis not present

## 2022-11-29 NOTE — Progress Notes (Signed)
Subjective:   Morgan Hurst is a 77 y.o. female who presents for Medicare Annual (Subsequent) preventive examination.  Visit Complete: Virtual I connected with  Morgan Hurst on 11/29/22 by a audio enabled telemedicine application and verified that I am speaking with the correct person using two identifiers.  [place]  Provider Location: Office/Clinic  I discussed the limitations of evaluation and management by telemedicine. The patient expressed understanding and agreed to proceed.  Vital Signs: Because this visit was a virtual/telehealth visit, some criteria may be missing or patient reported. Any vitals not documented were not able to be obtained and vitals that have been documented are patient reported.   Cardiac Risk Factors include: advanced age (>41men, >39 women);dyslipidemia;obesity (BMI >30kg/m2)     Objective:    Today's Vitals   11/29/22 0903  Weight: 180 lb (81.6 kg)  Height: 5' 1.5" (1.562 m)   Body mass index is 33.46 kg/m.     11/29/2022    9:15 AM 11/23/2021    3:03 PM 11/19/2020   10:20 AM 08/27/2019   11:21 AM 08/24/2018   10:12 AM 08/22/2017   10:22 AM 07/10/2017    7:11 AM  Advanced Directives  Does Patient Have a Medical Advance Directive? Yes Yes Yes Yes Yes Yes Yes  Type of Estate agent of [place];Living will Healthcare Power of [place];Living will Healthcare Power of [place];Living will Living will Healthcare Power of [place];Living will Healthcare Power of [place];Living will   Does patient want to make changes to medical advance directive?  No - Patient declined No - Patient declined No - Patient declined No - Patient declined No - Patient declined   Copy of Healthcare Power of Attorney in Chart? No - copy requested No - copy requested No - copy requested  No - copy requested No - copy requested     Current Medications (verified) Outpatient Encounter Medications as of 11/29/2022   Medication Sig   acetaminophen (TYLENOL) 650 MG CR tablet Take 650 mg by mouth every 8 (eight) hours as needed for pain.   Alirocumab (PRALUENT) 75 MG/ML SOAJ ADMINISTER 1 ML UNDER THE SKIN EVERY 14 DAYS   cholecalciferol (VITAMIN D3) 25 MCG (1000 UNIT) tablet Take 2,000 Units by mouth daily.   clotrimazole (LOTRIMIN) 1 % cream Apply 1 Application topically 2 (two) times daily.   cyanocobalamin (DODEX) 1000 MCG/ML injection INJECT ONCE PER MONTH   diclofenac (VOLTAREN) 75 MG EC tablet Take 1 tablet (75 mg total) by mouth 2 (two) times daily.   esomeprazole (NEXIUM) 40 MG capsule Take 40 mg by mouth daily as needed.   metolazone (ZAROXOLYN) 5 MG tablet TAKE 1 TABLET BY MOUTH DAILY AS NEEDED   ondansetron (ZOFRAN-ODT) 4 MG disintegrating tablet Take 1 tablet (4 mg total) by mouth every 8 (eight) hours as needed.   potassium chloride (KLOR-CON M) 10 MEQ tablet Take 1 tablet by mouth twice daily. **Take with Metolazone or Torsemide.**   rOPINIRole (REQUIP) 0.25 MG tablet TAKE 1 TABLET(0.25 MG) BY MOUTH AT BEDTIME AS NEEDED   Semaglutide, 1 MG/DOSE, 4 MG/3ML SOPN Inject 1 mg as directed once a week. (Patient taking differently: Inject 1 mg as directed once a week. PATIENT IS TAKING 2 MG WEEKLY INSTEAD OF 1 MG)   torsemide (DEMADEX) 20 MG tablet Take 1 tablet (20 mg total) by mouth daily as needed.   traMADol (ULTRAM) 50 MG tablet TAKE 1 TABLET(50 MG) BY MOUTH DAILY AS  NEEDED   Vitamin E 400 units TABS Take 400 Units by mouth daily.   Facility-Administered Encounter Medications as of 11/29/2022  Medication   ipratropium-albuterol (DUONEB) 0.5-2.5 (3) MG/3ML nebulizer solution 3 mL    Allergies (verified) Patient has no known allergies.   History: Past Medical History:  Diagnosis Date   Arthritis    lower back, right hip   B12 deficiency    Back pain    Borderline diabetes    PCP STARTED PT ON [facility]FOR[facility] IN 12/20/14 DUE TO ELEVATED GLUCOSE    Complication of anesthesia    FIGHTING WHEN  WAKING UP FROM ANESTHESIA   Constipation    Diabetes mellitus without complication (HCC)    Endometriosis    Fatty liver    Gallbladder problem    GERD (gastroesophageal reflux disease)    Heart murmur    High cholesterol    Hip pain    Joint pain    Kidney stones    Leg weakness    Orthodontics    top front 3 teeth caps and bridge   Prediabetes    RA (rheumatoid arthritis) (HCC)    SOB (shortness of breath)    Stomach ulcer    Swelling    Vitamin D deficiency    Past Surgical History:  Procedure Laterality Date   ABDOMINAL HYSTERECTOMY  age 59   ANKLE SURGERY     CHOLECYSTECTOMY N/A 04/10/2015   Procedure: LAPAROSCOPIC CHOLECYSTECTOMY WITH INTRAOPERATIVE CHOLANGIOGRAM;  Surgeon: Earline Mayotte, MD;  Location: [facility] ORS;  Service: General;  Laterality: N/A;   COLONOSCOPY WITH PROPOFOL N/A 07/10/2017   Procedure: COLONOSCOPY WITH PROPOFOL;  Surgeon: Wyline Mood, MD;  Location: [facility];  Service: Gastroenterology;  Laterality: N/A;   ESOPHAGOGASTRODUODENOSCOPY N/A 05/12/2015   Procedure: ESOPHAGOGASTRODUODENOSCOPY (EGD);  Surgeon: Wallace Cullens, MD;  Location: [facility];  Service: Gastroenterology;  Laterality: N/A;   VAGINAL DELIVERY  2   Family History  Problem Relation Age of Onset   Diabetes Mother    Cancer Mother        colon and breast   Breast cancer Mother    Liver disease Mother    Cancer Father        Leukemia   Cancer Brother        colon and lung   Colon cancer Brother    Cholelithiasis Daughter    Heart disease Paternal Grandfather    Cancer Other 29       colon   Thyroid cancer Neg Hx    Social History   Socioeconomic History   Marital status: Widowed    Spouse name: Not on file   Number of children: 2   Years of education: Not on file   Highest education level: Not on file  Occupational History   Occupation: Retired  Tobacco Use   Smoking status: Never   Smokeless tobacco: Never  Vaping Use   Vaping status: Never Used   Substance and Sexual Activity   Alcohol use: No   Drug use: No   Sexual activity: Not Currently  Other Topics Concern   Not on file  Social History Narrative   Lives in [place].       Husband passed away December 20, 2014 from PNA.      Work - retired from Dance movement psychotherapist      Diet - regular; working on [employer]   Exercise - walks occasionally, limited by fatigue. 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  Social Determinants of Health   Financial Resource Strain: Low Risk  (11/29/2022)   Overall Financial Resource Strain (CARDIA)    Difficulty of Paying Living Expenses: Not hard at all  Food Insecurity: No Food Insecurity (11/29/2022)   Hunger Vital Sign    Worried About Running Out of Food in the Last Year: Never true    Ran Out of Food in the Last Year: Never true  Transportation Needs: No Transportation Needs (11/29/2022)   PRAPARE - Administrator, Civil Service (Medical): No    Lack of Transportation (Non-Medical): No  Physical Activity: Inactive (11/29/2022)   Exercise Vital Sign    Days of Exercise per Week: 0 days    Minutes of Exercise per Session: 0 min  Stress: No Stress Concern Present (11/29/2022)   [employer] of Occupational Health - Occupational Stress Questionnaire    Feeling of Stress : Not at all  Social Connections: Moderately Isolated (11/29/2022)   Social Connection and Isolation Panel [NHANES]    Frequency of Communication with Friends and Family: More than three times a week    Frequency of Social Gatherings with Friends and Family: More than three times a week    Attends Religious Services: More than 4 times per year    Active Member of [employer] or Organizations: No    Attends Banker Meetings: Never    Marital Status: Widowed    Tobacco Counseling Counseling given: Not Answered   Clinical Intake:  Pre-visit preparation completed: Yes  Pain : No/denies pain     BMI - recorded: 33.46 Nutritional  Status: BMI > 30  Obese Nutritional Risks: None Diabetes: No  How often do you need to have someone help you when you read instructions, pamphlets, or other written materials from your doctor or pharmacy?: 1 - Never  Interpreter Needed?: No  Information entered by :: R. Hildy Nicholl LPN   Activities of Daily Living    11/29/2022    9:05 AM  In your present state of health, do you have any difficulty performing the following activities:  Hearing? 0  Vision? 0  Comment glasses  Difficulty concentrating or making decisions? 0  Walking or climbing stairs? 0  Dressing or bathing? 0  Doing errands, shopping? 0  Preparing Food and eating ? N  Using the Toilet? N  In the past six months, have you accidently leaked urine? N  Do you have problems with loss of bowel control? N  Managing your Medications? N  Managing your Finances? N  Housekeeping or managing your Housekeeping? N    Patient Care Team: Allegra Grana, FNP as PCP - General (Family Medicine) Shelia Media, MD (Internal Medicine) Lemar Livings Merrily Pew, MD (General Surgery)  Indicate any recent Medical Services you may have received from other than [facility] providers in the past year (date may be approximate).     Assessment:   This is a routine wellness examination for Ailee.  Hearing/Vision screen Hearing Screening - Comments:: No issues Vision Screening - Comments:: glasses   Goals Addressed             This Visit's Progress    Patient Stated       Wants to lose 20 more pounds       Depression Screen    11/29/2022    9:11 AM 08/22/2022    9:39 AM 05/18/2022    9:04 AM 03/02/2022   12:04 PM 12/08/2021   11:10 AM 11/23/2021  3:10 PM 11/23/2021    3:05 PM  PHQ 2/9 Scores  PHQ - 2 Score 0 0 0 0 0 0 0  PHQ- 9 Score 0          Fall Risk    11/29/2022    9:07 AM 08/22/2022    9:39 AM 05/18/2022    9:04 AM 03/11/2022    9:29 AM 03/02/2022   12:03 PM  Fall Risk   Falls in the past year? 0 0 0 0 0  Number  falls in past yr: 0 0 0 0 0  Injury with Fall? 0 0 0 0 0  Risk for fall due to : No Fall Risks No Fall Risks No Fall Risks No Fall Risks No Fall Risks  Follow up Falls prevention discussed;Falls evaluation completed Falls evaluation completed Falls evaluation completed Falls evaluation completed Falls evaluation completed    MEDICARE RISK AT HOME: Medicare Risk at Home Any stairs in or around the home?: Yes If so, are there any without handrails?: No Home free of loose throw rugs in walkways, pet beds, electrical cords, etc?: Yes Adequate lighting in your home to reduce risk of falls?: Yes Life alert?: No Use of a cane, walker or w/c?: No Grab bars in the bathroom?: No Shower chair or bench in shower?: Yes Elevated toilet seat or a handicapped toilet?: Yes    Cognitive Function:    08/22/2017   10:32 AM 08/11/2016    1:34 PM 08/12/2015    1:45 PM  MMSE - Mini Mental State Exam  Orientation to time 5 5 5   Orientation to Place 5 5 5   Registration 3 3 3   Attention/ Calculation 5 5 5   Recall 3 3 3   Language- name 2 objects 2 2 2   Language- repeat 1 1 1   Language- follow 3 step command 3 3 3   Language- read & follow direction 1 1 1   Write a sentence 1 1 1   Copy design 1 1 1   Total score 30 30 30         11/29/2022    9:16 AM 11/23/2021    3:16 PM 11/19/2020   10:05 AM 08/27/2019   11:24 AM 08/24/2018   10:14 AM  6CIT Screen  What Year? 0 points 0 points 0 points 0 points 0 points  What month? 0 points 0 points 0 points 0 points 0 points  What time? 0 points 0 points 0 points  0 points  Count back from 20 0 points 0 points 0 points  0 points  Months in reverse 0 points 0 points 0 points 0 points 0 points  Repeat phrase 2 points 0 points 0 points  0 points  Total Score 2 points 0 points 0 points  0 points    Immunizations Immunization History  Administered Date(s) Administered   Fluad Quad(high Dose 65+) 01/01/2020, 10/30/2020, 11/17/2021   Hepb-cpg 09/05/2019, 09/25/2019    Influenza Split 12/26/2011   Influenza, High Dose Seasonal PF 11/25/2015, 10/13/2016, 09/26/2017, 11/30/2018   Influenza,inj,Quad PF,6+ Mos 12/25/2012, 03/18/2014, 10/07/2014   Pneumococcal Conjugate-13 03/18/2014   Pneumococcal Polysaccharide-23 12/26/2011   Tdap 12/25/2009   Zoster, Live 12/26/2010    TDAP status: Due, Education has been provided regarding the importance of this vaccine. Advised may receive this vaccine at local pharmacy or Health Dept. Aware to provide a copy of the vaccination record if obtained from local pharmacy or Health Dept. Verbalized acceptance and understanding.  Flu Vaccine status: Due, Education has been provided regarding  the importance of this vaccine. Advised may receive this vaccine at local pharmacy or Health Dept. Aware to provide a copy of the vaccination record if obtained from local pharmacy or Health Dept. Verbalized acceptance and understanding.  Pneumococcal vaccine status: Up to date  Covid-19 vaccine status: Information provided on how to obtain vaccines.   Qualifies for Shingles Vaccine? Yes   Zostavax completed No   Shingrix Completed?: No.    Education has been provided regarding the importance of this vaccine. Patient has been advised to call insurance company to determine out of pocket expense if they have not yet received this vaccine. Advised may also receive vaccine at local pharmacy or Health Dept. Verbalized acceptance and understanding.  Screening Tests Health Maintenance  Topic Date Due   Zoster Vaccines- Shingrix (1 of 2) 06/05/1964   DTaP/Tdap/Td (2 - Td or Tdap) 12/26/2019   MAMMOGRAM  03/18/2022   Medicare Annual Wellness (AWV)  11/24/2022   INFLUENZA VACCINE  04/17/2023 (Originally 08/18/2022)   Diabetic kidney evaluation - eGFR measurement  03/12/2023   Diabetic kidney evaluation - Urine ACR  03/12/2023   Colonoscopy  09/15/2027   Pneumonia Vaccine 1+ Years old  Completed   DEXA SCAN  Completed   Hepatitis C Screening   Completed   HPV VACCINES  Aged Out   COVID-19 Vaccine  Discontinued    Health Maintenance  Health Maintenance Due  Topic Date Due   Zoster Vaccines- Shingrix (1 of 2) 06/05/1964   DTaP/Tdap/Td (2 - Td or Tdap) 12/26/2019   MAMMOGRAM  03/18/2022   Medicare Annual Wellness (AWV)  11/24/2022    Colorectal cancer screening: Type of screening: Colonoscopy. Completed 08/2022. Repeat every 5 years Patient stated not needed again because of age, but will plan on having one more  Mammogram status: Completed 03/2021. Repeat every year Order was placed 05/18/22  Bone Density status: Ordered 11/29/22. Pt provided with contact info and advised to call to schedule appt.  Lung Cancer Screening: (Low Dose CT Chest recommended if Age 18-80 years, 20 pack-year currently smoking OR have quit w/in 15years.) does not qualify.     Additional Screening:  Hepatitis C Screening: does qualify; Completed 02/2016  Vision Screening: Recommended annual ophthalmology exams for early detection of glaucoma and other disorders of the eye. Is the patient up to date with their annual eye exam?  No  Who is the provider or what is the name of the office in which the patient attends annual eye exams? [facility], Patient will call and schedule an appointment If pt is not established with a provider, would they like to be referred to a provider to establish care? No .   Dental Screening: Recommended annual dental exams for proper oral hygiene   Community Resource Referral / Chronic Care Management: CRR required this visit?  No   CCM required this visit?  No     Plan:     I have personally reviewed and noted the following in the patient's chart:   Medical and social history Use of alcohol, tobacco or illicit drugs  Current medications and supplements including opioid prescriptions. Patient is currently taking opioid prescriptions. Information provided to patient regarding non-opioid alternatives.  Patient advised to discuss non-opioid treatment plan with their provider. Functional ability and status Nutritional status Physical activity Advanced directives List of other physicians Hospitalizations, surgeries, and ER visits in previous 12 months Vitals Screenings to include cognitive, depression, and falls Referrals and appointments  In addition, I have  reviewed and discussed with patient certain preventive protocols, quality metrics, and best practice recommendations. A written personalized care plan for preventive services as well as general preventive health recommendations were provided to patient.     Sydell Axon, LPN   16/10/9602   After Visit Summary: (MyChart) Due to this being a telephonic visit, the after visit summary with patients personalized plan was offered to patient via MyChart   Nurse Notes: None

## 2022-11-29 NOTE — Patient Instructions (Signed)
Morgan Hurst , Thank you for taking time to come for your Medicare Wellness Visit. I appreciate your ongoing commitment to your health goals. Please review the following plan we discussed and let me know if I can assist you in the future.   Referrals/Orders/Follow-Ups/Clinician Recommendations: Bone density ordered today. Remember to update you vaccines You have an order for:  []   2D Mammogram  []   3D Mammogram  [x]   Bone Density     Please call for appointment:  [facility]  [address] [place] [address] [phone]   Managing Pain Without Opioids Opioids are strong medicines used to treat moderate to severe pain. For some people, especially those who have long-term (chronic) pain, opioids may not be the best choice for pain management due to: Side effects like nausea, constipation, and sleepiness. The risk of addiction (opioid use disorder). The longer you take opioids, the greater your risk of addiction. Pain that lasts for more than 3 months is called chronic pain. Managing chronic pain usually requires more than one approach and is often provided by a team of health care providers working together (multidisciplinary approach). Pain management may be done at a pain management center or pain clinic. How to manage pain without the use of opioids Use non-opioid medicines Non-opioid medicines for pain may include: Over-the-counter or prescription non-steroidal anti-inflammatory drugs (NSAIDs). These may be the first medicines used for pain. They work well for muscle and bone pain, and they reduce swelling. Acetaminophen. This over-the-counter medicine may work well for milder pain but not swelling. Antidepressants. These may be used to treat chronic pain. A certain type of antidepressant (tricyclics) is often used. These medicines are given in lower doses for pain than when used for depression. Anticonvulsants. These are usually  used to treat seizures but may also reduce nerve (neuropathic) pain. Muscle relaxants. These relieve pain caused by sudden muscle tightening (spasms). You may also use a pain medicine that is applied to the skin as a patch, cream, or gel (topical analgesic), such as a numbing medicine. These may cause fewer side effects than medicines taken by mouth. Do certain therapies as directed Some therapies can help with pain management. They include: Physical therapy. You will do exercises to gain strength and flexibility. A physical therapist may teach you exercises to move and stretch parts of your body that are weak, stiff, or painful. You can learn these exercises at physical therapy visits and practice them at home. Physical therapy may also involve: Massage. Heat wraps or applying heat or cold to affected areas. Electrical signals that interrupt pain signals (transcutaneous electrical nerve stimulation, TENS). Weak lasers that reduce pain and swelling (low-level laser therapy). Signals from your body that help you learn to regulate pain (biofeedback). Occupational therapy. This helps you to learn ways to function at home and work with less pain. Recreational therapy. This involves trying new activities or hobbies, such as a physical activity or drawing. Mental health therapy, including: Cognitive behavioral therapy (CBT). This helps you learn coping skills for dealing with pain. Acceptance and commitment therapy (ACT) to change the way you think and react to pain. Relaxation therapies, including muscle relaxation exercises and mindfulness-based stress reduction. Pain management counseling. This may be individual, family, or group counseling.  Receive medical treatments Medical treatments for pain management include: Nerve block injections. These may include a pain blocker and anti-inflammatory medicines. You may have injections: Near the spine to relieve chronic back or  neck pain. Into joints to  relieve back or joint pain. Into nerve areas that supply a painful area to relieve body pain. Into muscles (trigger point injections) to relieve some painful muscle conditions. A medical device placed near your spine to help block pain signals and relieve nerve pain or chronic back pain (spinal cord stimulation device). Acupuncture. Follow these instructions at home Medicines Take over-the-counter and prescription medicines only as told by your health care provider. If you are taking pain medicine, ask your health care providers about possible side effects to watch out for. Do not drive or use heavy machinery while taking prescription opioid pain medicine. Lifestyle  Do not use drugs or alcohol to reduce pain. If you drink alcohol, limit how much you have to: 0-1 drink a day for women who are not pregnant. 0-2 drinks a day for men. Know how much alcohol is in a drink. In the [place] one drink equals one 12 oz bottle of beer (355 mL), one 5 oz glass of wine (148 mL), or one 1 oz glass of hard liquor (44 mL). Do not use any products that contain nicotine or tobacco. These products include cigarettes, chewing tobacco, and vaping devices, such as e-cigarettes. If you need help quitting, ask your health care provider. Eat a healthy diet and maintain a healthy weight. Poor diet and excess weight may make pain worse. Eat foods that are high in fiber. These include fresh fruits and vegetables, whole grains, and beans. Limit foods that are high in fat and processed sugars, such as fried and sweet foods. Exercise regularly. Exercise lowers stress and may help relieve pain. Ask your health care provider what activities and exercises are safe for you. If your health care provider approves, join an exercise class that combines movement and stress reduction. Examples include yoga and tai chi. Get enough sleep. Lack of sleep may make pain worse. Lower stress as much as possible. Practice stress reduction  techniques as told by your therapist. General instructions Work with all your pain management providers to find the treatments that work best for you. You are an important member of your pain management team. There are many things you can do to reduce pain on your own. Consider joining an online or in-person support group for people who have chronic pain. Keep all follow-up visits. This is important. Where to find more information You can find more information about managing pain without opioids from: American Academy of Pain Medicine: painmed.org Institute for Chronic Pain: instituteforchronicpain.org American Chronic Pain Association: theacpa.org Contact a health care provider if: You have side effects from pain medicine. Your pain gets worse or does not get better with treatments or home therapy. You are struggling with anxiety or depression. Summary Many types of pain can be managed without opioids. Chronic pain may respond better to pain management without opioids. Pain is best managed when you and a team of health care providers work together. Pain management without opioids may include non-opioid medicines, medical treatments, physical therapy, mental health therapy, and lifestyle changes. Tell your health care providers if your pain gets worse or is not being managed well enough. This information is not intended to replace advice given to you by your health care provider. Make sure you discuss any questions you have with your health care provider. Document Revised: 04/15/2020 Document Reviewed: 04/15/2020 Elsevier Patient Education  2024 Elsevier Inc.    Make sure to wear two-piece clothing.  No lotions, powders, or deodorants the day of the  appointment. Make sure to bring picture ID and insurance card.  Bring list of medications you are currently taking including any supplements.   Schedule your [place] screening mammogram through MyChart!   Log into your MyChart  account.  Go to 'Visit' (or 'Appointments' if on mobile App) --> Schedule an Appointment  Under 'Select a Reason for Visit' choose the Mammogram Screening option.  Complete the pre-visit questions and select the time and place that best fits your schedule.    This is a list of the screening recommended for you and due dates:  Health Maintenance  Topic Date Due   Zoster (Shingles) Vaccine (1 of 2) 06[birth year]05/1965   DTaP/Tdap/Td vaccine (2 - Td or Tdap) 12/26/2019   Mammogram  03/18/2022   Flu Shot  04/17/2023*   Yearly kidney function blood test for diabetes  03/12/2023   Yearly kidney health urinalysis for diabetes  03/12/2023   Medicare Annual Wellness Visit  11/29/2023   Colon Cancer Screening  09/15/2027   Pneumonia Vaccine  Completed   DEXA scan (bone density measurement)  Completed   Hepatitis C Screening  Completed   HPV Vaccine  Aged Out   COVID-19 Vaccine  Discontinued  *Topic was postponed. The date shown is not the original due date.    Advanced directives: (Copy Requested) Please bring a copy of your health care power of attorney and living will to the office to be added to your chart at your convenience.  Next Medicare Annual Wellness Visit scheduled for next year: Yes 12/01/23 @ 11:30

## 2022-12-02 ENCOUNTER — Telehealth: Payer: Self-pay

## 2022-12-02 NOTE — Telephone Encounter (Signed)
Reached out to pt via Mychart regarding 2025 re enrollment for Ozempic pt assistance through [facility]

## 2022-12-05 DIAGNOSIS — E1169 Type 2 diabetes mellitus with other specified complication: Secondary | ICD-10-CM | POA: Diagnosis not present

## 2022-12-05 DIAGNOSIS — R252 Cramp and spasm: Secondary | ICD-10-CM | POA: Diagnosis not present

## 2022-12-05 DIAGNOSIS — R632 Polyphagia: Secondary | ICD-10-CM | POA: Diagnosis not present

## 2022-12-05 DIAGNOSIS — E65 Localized adiposity: Secondary | ICD-10-CM | POA: Diagnosis not present

## 2022-12-13 NOTE — Telephone Encounter (Signed)
Called pt and left HIPPA compliant VM.

## 2022-12-27 ENCOUNTER — Ambulatory Visit
Admission: RE | Admit: 2022-12-27 | Discharge: 2022-12-27 | Disposition: A | Discharge status: Home / Self Care | Payer: Medicare Other | Source: Ambulatory Visit | Attending: Family | Admitting: Family

## 2022-12-27 DIAGNOSIS — Z1231 Encounter for screening mammogram for malignant neoplasm of breast: Secondary | ICD-10-CM | POA: Diagnosis not present

## 2022-12-29 ENCOUNTER — Other Ambulatory Visit: Payer: Self-pay | Admitting: Family

## 2022-12-29 DIAGNOSIS — R252 Cramp and spasm: Secondary | ICD-10-CM

## 2022-12-29 DIAGNOSIS — M48062 Spinal stenosis, lumbar region with neurogenic claudication: Secondary | ICD-10-CM

## 2022-12-30 NOTE — Telephone Encounter (Signed)
Spoke to pt and she needed to check her calendar and she would call back to schedule f/up for Tramadol

## 2023-01-02 DIAGNOSIS — E782 Mixed hyperlipidemia: Secondary | ICD-10-CM | POA: Diagnosis not present

## 2023-01-02 DIAGNOSIS — E1169 Type 2 diabetes mellitus with other specified complication: Secondary | ICD-10-CM | POA: Diagnosis not present

## 2023-01-02 DIAGNOSIS — I509 Heart failure, unspecified: Secondary | ICD-10-CM | POA: Diagnosis not present

## 2023-01-04 NOTE — Telephone Encounter (Signed)
Pt has been scheduled for 01/19/23

## 2023-01-06 DIAGNOSIS — H2513 Age-related nuclear cataract, bilateral: Secondary | ICD-10-CM | POA: Diagnosis not present

## 2023-01-06 DIAGNOSIS — R7303 Prediabetes: Secondary | ICD-10-CM | POA: Diagnosis not present

## 2023-01-09 ENCOUNTER — Telehealth: Payer: Self-pay

## 2023-01-09 NOTE — Telephone Encounter (Signed)
Left HIPAA compliant voice mail for re-enroll for 2025

## 2023-01-12 ENCOUNTER — Telehealth: Payer: Self-pay

## 2023-01-12 NOTE — Telephone Encounter (Signed)
PAP application for [employer])  has been mailed to pt home. Faxed provider portion to office.

## 2023-01-17 ENCOUNTER — Telehealth: Payer: Self-pay | Admitting: Family

## 2023-01-17 NOTE — Telephone Encounter (Signed)
 Copied from CRM [id number]. Topic: General - Other >> Jan 17, 2023  9:33 AM Florestine Avers wrote: Reason for CRM: Patient states she had a missed call from patient assistance and needs to speak to [place].

## 2023-01-19 ENCOUNTER — Ambulatory Visit: Payer: Medicare Other | Admitting: Family

## 2023-01-19 ENCOUNTER — Encounter: Payer: Self-pay | Admitting: Family

## 2023-01-19 VITALS — BP 112/72 | HR 75 | Temp 97.7°F | Ht 61.5 in | Wt 184.6 lb

## 2023-01-19 DIAGNOSIS — B372 Candidiasis of skin and nail: Secondary | ICD-10-CM | POA: Diagnosis not present

## 2023-01-19 DIAGNOSIS — E538 Deficiency of other specified B group vitamins: Secondary | ICD-10-CM

## 2023-01-19 DIAGNOSIS — E785 Hyperlipidemia, unspecified: Secondary | ICD-10-CM

## 2023-01-19 MED ORDER — FLUCONAZOLE 150 MG PO TABS: 150.0000 mg | ORAL_TABLET | ORAL | 0 refills | Status: AC

## 2023-01-19 MED ORDER — HYDROCORTISONE 2.5 % EX CREA: TOPICAL_CREAM | CUTANEOUS | 2 refills | Status: DC

## 2023-01-19 MED ORDER — CYANOCOBALAMIN 1000 MCG/ML IJ SOLN: INTRAMUSCULAR | 2 refills | Status: AC

## 2023-01-19 MED ORDER — PRALUENT 75 MG/ML ~~LOC~~ SOAJ: SUBCUTANEOUS | 3 refills | Status: DC

## 2023-01-19 NOTE — Progress Notes (Signed)
 Assessment & Plan:  Candidal intertrigo Assessment & Plan: Presentation consistent Candida intertrigo.  Discussed likely to be chronic in nature.  Advised Diflucan  weekly x 4 weeks, and temporary use of topical hydrocortisone  2.5%.  Consider dermatology referral.   Orders: -     Fluconazole ; Take 1 tablet (150 mg total) by mouth once a week for 4 doses.  Dispense: 4 tablet; Refill: 0 -     Hydrocortisone ; applied twice daily for one week under pannus and then once daily for one week, and every other day for one week.  Dispense: 30 g; Refill: 2  B12 deficiency -     Cyanocobalamin ; INJECT ONCE PER MONTH  Dispense: 10 mL; Refill: 2 -     TSH; Future -     B12 and Folate Panel; Future  Hyperlipidemia, unspecified hyperlipidemia type -     Praluent ; ADMINISTER 1 ML UNDER THE SKIN EVERY 14 DAYS  Dispense: 2 mL; Refill: 3 -     Comprehensive metabolic panel; Future -     Hemoglobin A1c; Future -     Lipid panel; Future     Return precautions given.   Risks, benefits, and alternatives of the medications and treatment plan prescribed today were discussed, and patient expressed understanding.   Education regarding symptom management and diagnosis given to patient on AVS either electronically or printed.  Return in about 4 months (around 05/19/2023) for Fasting labs in 2-3 weeks.  Rollene Northern, FNP  Subjective:    Patient ID: Morgan Hurst, female    DOB: Aug 31, 1945, 78 y.o.   MRN: [id number]  CC: MARIEA MCMARTIN is a 78 y.o. female who presents today for an acute visit.    HPI: Here today to discuss rash under pannus which has been chronic for several months to a year, waxes and wanes.   Today is a better day.  She has tried zinc oxide, clotrimazole  1% cream, miconazole powder, 2% hydrocortisone .  She notes that 2% hydrocortisone  and miconazole 2% were the most helpful. Aquaphor with some relief.  She tries to keep area dry.    She request refill for Praluent , B12  injection    Allergies: Patient has no known allergies. Current Outpatient Medications on File Prior to Visit  Medication Sig Dispense Refill   acetaminophen  (TYLENOL ) 650 MG CR tablet Take 650 mg by mouth every 8 (eight) hours as needed for pain.     ascorbic acid (VITAMIN C) 500 MG tablet Take 500 mg by mouth daily.     cholecalciferol  (VITAMIN D3) 25 MCG (1000 UNIT) tablet Take 2,000 Units by mouth daily.     clotrimazole  (LOTRIMIN ) 1 % cream Apply 1 Application topically 2 (two) times daily. 30 g 1   diclofenac  (VOLTAREN ) 75 MG EC tablet Take 1 tablet (75 mg total) by mouth 2 (two) times daily. 60 tablet 3   esomeprazole (NEXIUM) 40 MG capsule Take 40 mg by mouth daily as needed.     magnesium 30 MG tablet Take 30 mg by mouth daily.     metolazone  (ZAROXOLYN ) 5 MG tablet TAKE 1 TABLET BY MOUTH DAILY AS NEEDED 90 tablet 1   ondansetron  (ZOFRAN -ODT) 4 MG disintegrating tablet Take 1 tablet (4 mg total) by mouth every 8 (eight) hours as needed. 20 tablet 0   potassium chloride  (KLOR-CON  M) 10 MEQ tablet Take 1 tablet by mouth twice daily. **Take with Metolazone  or Torsemide .** 180 tablet 1   rOPINIRole  (REQUIP ) 0.25 MG tablet TAKE 1 TABLET(0.25  MG) BY MOUTH AT BEDTIME AS NEEDED 90 tablet 0   Semaglutide , 1 MG/DOSE, 4 MG/3ML SOPN Inject 1 mg as directed once a week. (Patient taking differently: Inject 1 mg as directed once a week. PATIENT IS TAKING 2 MG WEEKLY INSTEAD OF 1 MG) 3 mL 3   torsemide  (DEMADEX ) 20 MG tablet Take 1 tablet (20 mg total) by mouth daily as needed. 90 tablet 0   traMADol  (ULTRAM ) 50 MG tablet TAKE 1 TABLET(50 MG) BY MOUTH DAILY AS NEEDED 30 tablet 1   Vitamin E 400 units TABS Take 400 Units by mouth daily.     Current Facility-Administered Medications on File Prior to Visit  Medication Dose Route Frequency Provider Last Rate Last Admin   ipratropium-albuterol  (DUONEB) 0.5-2.5 (3) MG/3ML nebulizer solution 3 mL  3 mL Nebulization Once Keilly Fatula G, FNP         Review of Systems  Constitutional:  Negative for chills and fever.  Respiratory:  Negative for cough.   Cardiovascular:  Negative for chest pain and palpitations.  Gastrointestinal:  Negative for nausea and vomiting.  Skin:  Positive for rash.      Objective:    BP 112/72   Pulse 75   Temp 97.7 F (36.5 C) (Oral)   Ht 5' 1.5 (1.562 m)   Wt 184 lb 9.6 oz (83.7 kg)   SpO2 98%   BMI 34.32 kg/m   BP Readings from Last 3 Encounters:  01/19/23 112/72  08/22/22 110/70  05/18/22 110/70   Wt Readings from Last 3 Encounters:  01/19/23 184 lb 9.6 oz (83.7 kg)  11/29/22 180 lb (81.6 kg)  08/22/22 177 lb (80.3 kg)    Physical Exam Vitals reviewed.  Constitutional:      Appearance: She is well-developed.  Eyes:     Conjunctiva/sclera: Conjunctivae normal.  Cardiovascular:     Rate and Rhythm: Normal rate and regular rhythm.     Pulses: Normal pulses.     Heart sounds: Normal heart sounds.  Pulmonary:     Effort: Pulmonary effort is normal.     Breath sounds: Normal breath sounds. No wheezing, rhonchi or rales.  Skin:    General: Skin is warm and dry.     Comments: Erythematous crease under pannus.  No satellite lesions, purulent discharge or foul odor.  Neurological:     Mental Status: She is alert.  Psychiatric:        Speech: Speech normal.        Behavior: Behavior normal.        Thought Content: Thought content normal.

## 2023-01-19 NOTE — Patient Instructions (Addendum)
 I suspect you have yeast infection under skin fold. This is called intertrigo candidiasis  Start fluconazole  therapy for adults is 150 mg once weekly for four weeks along with topical steroid, hydrocortisone  2.5%.   Consider topical clotrimazole  ( anti fungal) ointment after treatment above when symptoms flare.   Daily cleansing of intertriginous skin with a mild cleanser followed by drying of affected area with a hair dryer on a cool setting  ?Aeration of affected area when feasible and drying after shower. You may even use blow dryer on COOL setting after showers, baths to dry area  ?Daily application of drying powders, such as powders composed of microporous cellulose  ?Use of absorbent material or clothing, such as cotton or merino wool, to separate skin in folds  ?Application of barrier creams such as zinc oxide in areas that may come in contact with urine or feces  Intertrigo Intertrigo is skin irritation (inflammation) that happens in warm, moist areas of the body. The irritation can cause a rash and make skin raw and itchy. The rash is usually pink or red. It happens mostly between folds of skin or where skin rubs together, such as: Between the toes. In the armpits. In the groin area. Under the belly. Under the breasts. Around the butt area. This condition is not passed from person to person (is not contagious). What are the causes? Heat, moisture, rubbing, and not enough air movement. The condition can be made worse by: Sweat. Bacteria. A fungus, such as yeast. What increases the risk? Moisture in your skin folds. You are more likely to develop this condition if you: Have diabetes. Are overweight. Are not able to move around. Live in a warm and moist climate. Wear splints, braces, or other medical devices. Are not able to control your pee (urine) or poop (stool). What are the signs or symptoms? A pink or red skin rash in the skin fold or near the skin fold. Raw or  scaly skin. Itching. A burning feeling. Bleeding. Leaking fluid. A bad smell. How is this treated? Cleaning and drying your skin. Taking an antibiotic medicine or using an antibiotic skin cream for a bacterial infection. Using an antifungal cream on your skin or taking pills for an infection that was caused by a fungus, such as yeast. Using a steroid ointment to stop the itching and irritation. Separating the skin fold with a clean cotton cloth to absorb moisture and allow air to flow into the area. Follow these instructions at home: Keep the affected area clean and dry. Do not scratch your skin. Stay cool as much as you can. Use an air conditioner or a fan, if you have one. Apply over-the-counter and prescription medicines only as told by your doctor. If you were prescribed an antibiotic medicine, use it as told by your doctor. Do not stop using the antibiotic even if your condition starts to get better. Keep all follow-up visits as told by your doctor. This is important. How is this prevented?  Stay at a healthy weight. Take care of your feet. This is very important if you have diabetes. You should: Wear shoes that fit well. Keep your feet dry. Wear clean cotton or wool socks. Protect the skin in your groin and butt area as told by your doctor. To do this: Follow a regular cleaning routine. Use creams, powders, or ointments that protect your skin. Change protection pads often. Do not wear tight clothes. Wear clothes that: Are loose. Take moisture away from your body.  Are made of cotton. Wear a bra that gives good support, if needed. Shower and dry yourself well after being active. Use a hair dryer on a cool setting to dry between skin folds. Keep your blood sugar under control if you have diabetes. Contact a doctor if: Your symptoms do not get better with treatment. Your symptoms get worse or they spread. You notice more redness and warmth. You have a  fever. Summary Intertrigo is skin irritation that occurs when folds of skin rub together. This condition is caused by heat, moisture, and rubbing. This condition may be treated by cleaning and drying your skin and with medicines. Apply over-the-counter and prescription medicines only as told by your doctor. Keep all follow-up visits as told by your doctor. This is important. This information is not intended to replace advice given to you by your health care provider. Make sure you discuss any questions you have with your health care provider. Document Revised: 10/12/2017 Document Reviewed: 10/12/2017 Elsevier Patient Education  2022 [employer].

## 2023-01-19 NOTE — Assessment & Plan Note (Signed)
 Presentation consistent Candida intertrigo.  Discussed likely to be chronic in nature.  Advised Diflucan weekly x 4 weeks, and temporary use of topical hydrocortisone 2.5%.  Consider dermatology referral.

## 2023-01-23 ENCOUNTER — Other Ambulatory Visit: Payer: Self-pay

## 2023-01-23 DIAGNOSIS — E785 Hyperlipidemia, unspecified: Secondary | ICD-10-CM

## 2023-01-23 MED ORDER — PRALUENT 75 MG/ML ~~LOC~~ SOAJ: SUBCUTANEOUS | 3 refills | Status: DC

## 2023-02-02 DIAGNOSIS — E782 Mixed hyperlipidemia: Secondary | ICD-10-CM | POA: Diagnosis not present

## 2023-02-02 DIAGNOSIS — E1169 Type 2 diabetes mellitus with other specified complication: Secondary | ICD-10-CM | POA: Diagnosis not present

## 2023-02-02 DIAGNOSIS — E65 Localized adiposity: Secondary | ICD-10-CM | POA: Diagnosis not present

## 2023-02-02 DIAGNOSIS — R632 Polyphagia: Secondary | ICD-10-CM | POA: Diagnosis not present

## 2023-02-06 ENCOUNTER — Other Ambulatory Visit (INDEPENDENT_AMBULATORY_CARE_PROVIDER_SITE_OTHER): Payer: Medicare Other

## 2023-02-06 DIAGNOSIS — E785 Hyperlipidemia, unspecified: Secondary | ICD-10-CM

## 2023-02-06 DIAGNOSIS — E538 Deficiency of other specified B group vitamins: Secondary | ICD-10-CM

## 2023-02-06 LAB — COMPREHENSIVE METABOLIC PANEL
ALT: 11 U/L (ref 0–35)
AST: 15 U/L (ref 0–37)
Albumin: 4 g/dL (ref 3.5–5.2)
Alkaline Phosphatase: 64 U/L (ref 39–117)
BUN: 14 mg/dL (ref 6–23)
CO2: 29 meq/L (ref 19–32)
Calcium: 9.6 mg/dL (ref 8.4–10.5)
Chloride: 105 meq/L (ref 96–112)
Creatinine, Ser: 0.61 mg/dL (ref 0.40–1.20)
GFR: 86.69 mL/min (ref >60.00)
Glucose, Bld: 81 mg/dL (ref 70–99)
Potassium: 4.1 meq/L (ref 3.5–5.1)
Sodium: 142 meq/L (ref 135–145)
Total Bilirubin: 0.4 mg/dL (ref 0.2–1.2)
Total Protein: 7.1 g/dL (ref 6.0–8.3)

## 2023-02-06 LAB — LIPID PANEL
Cholesterol: 229 mg/dL — ABNORMAL HIGH (ref 0–200)
HDL: 49.6 mg/dL (ref >39.00)
LDL Cholesterol: 147 mg/dL — ABNORMAL HIGH (ref 0–99)
NonHDL: 179.53
Total CHOL/HDL Ratio: 5
Triglycerides: 163 mg/dL — ABNORMAL HIGH (ref 0.0–149.0)
VLDL: 32.6 mg/dL (ref 0.0–40.0)

## 2023-02-06 LAB — B12 AND FOLATE PANEL
Folate: 12.8 ng/mL (ref >5.9)
Vitamin B-12: 467 pg/mL (ref 211–911)

## 2023-02-06 LAB — HEMOGLOBIN A1C: Hgb A1c MFr Bld: 5.7 % (ref 4.6–6.5)

## 2023-02-06 LAB — TSH: TSH: 1.84 u[IU]/mL (ref 0.35–5.50)

## 2023-02-07 NOTE — Telephone Encounter (Signed)
LMTCB

## 2023-02-07 NOTE — Telephone Encounter (Signed)
call patient Please confirm if she is on Ozempic 1 mg or 2 mg so I complete patient assistance paperwork

## 2023-02-08 ENCOUNTER — Encounter: Payer: Self-pay | Admitting: Family

## 2023-02-09 NOTE — Telephone Encounter (Signed)
Copied from CRM [id number]. Topic: Clinical - Medication Question >> Feb 09, 2023  5:00 PM Florestine Avers wrote: Reason for CRM: Patient called in stating that she gave the wrong mg information to the nurse, she is actually taking 4ml Ozempic pen, but she take 2ml each shot. She just wanted to clarify the dosage.

## 2023-02-09 NOTE — Telephone Encounter (Signed)
Copied from CRM [id number]. Topic: Clinical - Prescription Issue >> Feb 09, 2023  5:04 PM Morgan Hurst wrote: Reason for CRM: Patients said that the pharmacy told her she can not pick up her cholesterol medication due to insurance issues. Patient may need an alternative medication that is covered by her insurance. Patient is asking for a call back in regards to this.

## 2023-02-09 NOTE — Telephone Encounter (Signed)
Spoke to pt and she is on the 2 mg Ozempic now

## 2023-02-10 NOTE — Telephone Encounter (Signed)
Faxed on 02/10/23 received confirmation of receipt

## 2023-02-10 NOTE — Telephone Encounter (Signed)
PAP: Application for Ozempic has been submitted to [employer], via fax for [employer]

## 2023-02-14 ENCOUNTER — Other Ambulatory Visit (HOSPITAL_COMMUNITY): Payer: Self-pay

## 2023-02-14 ENCOUNTER — Telehealth: Payer: Self-pay

## 2023-02-14 NOTE — Telephone Encounter (Signed)
Pharmacy Patient Advocate Encounter   Received notification from Patient Advice Request messages that prior authorization for Praluent is required/requested.   Insurance verification completed.   The patient is insured through [employer] .   Per test claim:  Repatha is preferred by the insurance.  If suggested medication is appropriate, Please send in a new RX and discontinue this one. If not, please advise as to why it's not appropriate so that we may request a Prior Authorization. Please note, some preferred medications may still require a PA.  If the suggested medications have not been trialed and there are no contraindications to their use, the PA will not be submitted, as it will not be approved.   Communicated with provider in original request.

## 2023-02-14 NOTE — Telephone Encounter (Signed)
Pharmacy Patient Advocate Encounter   Received notification from Patient Advice Request messages that prior authorization for Praluent is required/requested.   Insurance verification completed.   The patient is insured through  [employer]  .   Per test claim:  Repatha is preferred by the insurance.  If suggested medication is appropriate, Please send in a new RX and discontinue this one. If not, please advise as to why it's not appropriate so that we may request a Prior Authorization. Please note, some preferred medications may still require a PA.  If the suggested medications have not been trialed and there are no contraindications to their use, the PA will not be submitted, as it will not be approved.

## 2023-02-17 NOTE — Progress Notes (Signed)
Pharmacy Medication Assistance Program Note    02/17/2023  Patient ID: Morgan Hurst, female   DOB: 1945/11/19, 78 y.o.   MRN: [id number]     01/12/2023 02/17/2023  Outreach Medication One  Initial Outreach Date ([employer] One) 12/09/2022 12/16/2022  Manufacturer Medication One [employer]  [facility] Ozempic Ozempic  Dose of Ozempic 1mg  2mg   Type of Forensic scientist Assistance  Date Application Sent to Patient 01/12/2023   Application Items Requested Application;Proof of Income Application  Date Application Sent to Prescriber 01/12/2023   Name of Prescriber margaret Arnett Rennie Plowman  Date Application Received From Provider  02/06/2023  Date Application Submitted to Manufacturer  02/10/2023  Method Application Sent to Manufacturer  Fax  Patient Assistance Determination  Approved  Approval Start Date  02/17/2023  Approval End Date  02/12/2024  Patient Notification Method  MyChart

## 2023-02-21 DIAGNOSIS — H00011 Hordeolum externum right upper eyelid: Secondary | ICD-10-CM | POA: Diagnosis not present

## 2023-02-23 NOTE — Telephone Encounter (Signed)
 Pt has been APPROVED for 2025 per kim and Epic notes until 01/2024

## 2023-03-01 ENCOUNTER — Telehealth: Payer: Self-pay

## 2023-03-01 DIAGNOSIS — E785 Hyperlipidemia, unspecified: Secondary | ICD-10-CM

## 2023-03-01 NOTE — Telephone Encounter (Signed)
Copied from CRM [id number]. Topic: Clinical - Prescription Issue >> Mar 01, 2023  9:14 AM Shelbie Proctor wrote: Reason for CRM: Hilda Lias from [facility] pharmacy [phone] faxed a PA request for Alirocumab (PRALUENT) 75 MG/ML SOAJ and is checking on the status. Please call back.

## 2023-03-01 NOTE — Telephone Encounter (Signed)
PA for Praluent is needed

## 2023-03-01 NOTE — Telephone Encounter (Signed)
  Pharmacy Patient Advocate Encounter   Received notification from Patient Advice Request messages that prior authorization for Praluent is required/requested.   Insurance verification completed.   The patient is insured through  [place]  .  This is the message we received from PA team, is it ok to send this rx in to see if it is covered or not, verses the Praulent? Per test claim:  Repatha is preferred by the insurance.  If suggested medication is appropriate, Please send in a new RX and discontinue this one. If not, please advise as to why it's not appropriate so that we may request a Prior Authorization. Please note, some preferred medications may still require a PA.  If the suggested medications have not been trialed and there are no contraindications to their use, the PA will not be submitted, as it will not be approved.

## 2023-03-02 ENCOUNTER — Telehealth: Payer: Self-pay

## 2023-03-02 MED ORDER — REPATHA SURECLICK 140 MG/ML ~~LOC~~ SOAJ: 140.0000 mg | SUBCUTANEOUS | 2 refills | Status: DC

## 2023-03-02 NOTE — Telephone Encounter (Signed)
Copied from CRM [id number]. Topic: Clinical - Prescription Issue >> Mar 02, 2023  9:31 AM Shelbie Proctor wrote: Reason for CRM: Hilda Lias from [facility] pharmacy [phone] is checking on status of PA Alirocumab (PRALUENT) 75 MG/ML SOAJ. Informed Hilda Lias, the nurse is working on it. Hilda Lias still wants a call from nurse on this matter, please call back.

## 2023-03-02 NOTE — Telephone Encounter (Signed)
Call patient Please let her know that insurance prefers Repatha.  I sent in a new prescription.  She needs to stop Praluent and start Repatha.  Please call pharmacy as well to ensure pt can pick up

## 2023-03-02 NOTE — Telephone Encounter (Signed)
Pt has been notified.

## 2023-03-02 NOTE — Addendum Note (Signed)
Addended by: Allegra Grana on: 03/02/2023 11:42 AM   Modules accepted: Orders

## 2023-03-03 ENCOUNTER — Other Ambulatory Visit (HOSPITAL_COMMUNITY): Payer: Self-pay

## 2023-03-06 DIAGNOSIS — R632 Polyphagia: Secondary | ICD-10-CM | POA: Diagnosis not present

## 2023-03-06 DIAGNOSIS — E1169 Type 2 diabetes mellitus with other specified complication: Secondary | ICD-10-CM | POA: Diagnosis not present

## 2023-03-06 DIAGNOSIS — E65 Localized adiposity: Secondary | ICD-10-CM | POA: Diagnosis not present

## 2023-03-06 DIAGNOSIS — E782 Mixed hyperlipidemia: Secondary | ICD-10-CM | POA: Diagnosis not present

## 2023-03-08 ENCOUNTER — Other Ambulatory Visit: Payer: Medicare Other

## 2023-03-08 NOTE — Telephone Encounter (Signed)
 Awaiting change to Repatha, signing off on request

## 2023-03-21 ENCOUNTER — Telehealth: Payer: Self-pay

## 2023-03-21 NOTE — Telephone Encounter (Signed)
 Patient picked up her Patient Assistance Ozempic.

## 2023-03-21 NOTE — Telephone Encounter (Signed)
 Informed pt that Ozempic was ready to be picked up. Pt verbalized understanding.  Ozempic 8mg /40ml  Exp: 07/16/24  Lot: [id number]

## 2023-03-28 DIAGNOSIS — H4710 Unspecified papilledema: Secondary | ICD-10-CM | POA: Diagnosis not present

## 2023-03-28 DIAGNOSIS — H25813 Combined forms of age-related cataract, bilateral: Secondary | ICD-10-CM | POA: Diagnosis not present

## 2023-03-28 DIAGNOSIS — R7303 Prediabetes: Secondary | ICD-10-CM | POA: Diagnosis not present

## 2023-04-07 ENCOUNTER — Other Ambulatory Visit: Payer: Self-pay | Admitting: Family

## 2023-04-07 DIAGNOSIS — R600 Localized edema: Secondary | ICD-10-CM

## 2023-04-07 DIAGNOSIS — R252 Cramp and spasm: Secondary | ICD-10-CM

## 2023-04-11 DIAGNOSIS — E538 Deficiency of other specified B group vitamins: Secondary | ICD-10-CM | POA: Diagnosis not present

## 2023-04-11 DIAGNOSIS — M150 Primary generalized (osteo)arthritis: Secondary | ICD-10-CM | POA: Diagnosis not present

## 2023-04-11 DIAGNOSIS — E162 Hypoglycemia, unspecified: Secondary | ICD-10-CM | POA: Diagnosis not present

## 2023-04-11 DIAGNOSIS — K5903 Drug induced constipation: Secondary | ICD-10-CM | POA: Diagnosis not present

## 2023-04-11 DIAGNOSIS — E119 Type 2 diabetes mellitus without complications: Secondary | ICD-10-CM | POA: Diagnosis not present

## 2023-04-12 DIAGNOSIS — H469 Unspecified optic neuritis: Secondary | ICD-10-CM | POA: Diagnosis not present

## 2023-04-12 DIAGNOSIS — H4710 Unspecified papilledema: Secondary | ICD-10-CM | POA: Diagnosis not present

## 2023-04-19 DIAGNOSIS — H469 Unspecified optic neuritis: Secondary | ICD-10-CM | POA: Diagnosis not present

## 2023-05-03 DIAGNOSIS — H469 Unspecified optic neuritis: Secondary | ICD-10-CM | POA: Diagnosis not present

## 2023-05-11 ENCOUNTER — Encounter: Payer: Self-pay | Admitting: Family

## 2023-05-22 ENCOUNTER — Encounter: Payer: Self-pay | Admitting: Family

## 2023-05-22 ENCOUNTER — Ambulatory Visit (INDEPENDENT_AMBULATORY_CARE_PROVIDER_SITE_OTHER): Payer: Medicare Other | Admitting: Family

## 2023-05-22 VITALS — BP 130/70 | HR 75 | Temp 97.6°F | Ht 61.5 in | Wt 186.6 lb

## 2023-05-22 DIAGNOSIS — M48062 Spinal stenosis, lumbar region with neurogenic claudication: Secondary | ICD-10-CM | POA: Diagnosis not present

## 2023-05-22 DIAGNOSIS — L608 Other nail disorders: Secondary | ICD-10-CM

## 2023-05-22 DIAGNOSIS — R600 Localized edema: Secondary | ICD-10-CM | POA: Diagnosis not present

## 2023-05-22 DIAGNOSIS — G8929 Other chronic pain: Secondary | ICD-10-CM

## 2023-05-22 DIAGNOSIS — H539 Unspecified visual disturbance: Secondary | ICD-10-CM

## 2023-05-22 DIAGNOSIS — E785 Hyperlipidemia, unspecified: Secondary | ICD-10-CM

## 2023-05-22 DIAGNOSIS — E669 Obesity, unspecified: Secondary | ICD-10-CM

## 2023-05-22 LAB — HEMOGLOBIN A1C: Hgb A1c MFr Bld: 5.4 % (ref 4.6–6.5)

## 2023-05-22 LAB — COMPREHENSIVE METABOLIC PANEL WITH GFR
ALT: 13 U/L (ref 0–35)
AST: 17 U/L (ref 0–37)
Albumin: 4.1 g/dL (ref 3.5–5.2)
Alkaline Phosphatase: 57 U/L (ref 39–117)
BUN: 17 mg/dL (ref 6–23)
CO2: 27 meq/L (ref 19–32)
Calcium: 9.4 mg/dL (ref 8.4–10.5)
Chloride: 106 meq/L (ref 96–112)
Creatinine, Ser: 0.64 mg/dL (ref 0.40–1.20)
GFR: 85.52 mL/min (ref >60.00)
Glucose, Bld: 86 mg/dL (ref 70–99)
Potassium: 4.1 meq/L (ref 3.5–5.1)
Sodium: 141 meq/L (ref 135–145)
Total Bilirubin: 0.5 mg/dL (ref 0.2–1.2)
Total Protein: 7.2 g/dL (ref 6.0–8.3)

## 2023-05-22 NOTE — Progress Notes (Unsigned)
 Assessment & Plan:  Spinal stenosis, lumbar region, with neurogenic claudication Assessment & Plan: Chronic, symptomatically stable.  Refilled tramadol  50mg  for prn use   Orders: -     traMADol  HCl; TAKE 1 TABLET(50 MG) BY MOUTH DAILY AS NEEDED  Dispense: 30 tablet; Refill: 1  Changes in vision  Obesity (BMI 30-39.9) -     Hemoglobin A1c -     Comprehensive metabolic panel with GFR  Chronic pain of multiple sites  Lower extremity edema -     Torsemide ; Take 1 tablet (20 mg total) by mouth daily as needed.  Dispense: 90 tablet; Refill: 0  Acquired dysmorphic toenail Assessment & Plan: Appearance of onychomycosis Referral to podiatry.  Orders: -     Ambulatory referral to Podiatry  Hyperlipidemia, unspecified hyperlipidemia type Assessment & Plan: Statin intolerant.  Repatha  140mg  has become expensive.  Referral to pharmacy in regards to patient assistance.  Orders: -     AMB Referral VBCI Care Management     Return precautions given.   Risks, benefits, and alternatives of the medications and treatment plan prescribed today were discussed, and patient expressed understanding.   Education regarding symptom management and diagnosis given to patient on AVS either electronically or printed.  No follow-ups on file.  Bascom Bossier, FNP  Subjective:    Patient ID: Morgan Hurst, female    DOB: 10-Feb-1945, 78 y.o.   MRN: [id number]  CC: Morgan Hurst is a 78 y.o. female who presents today for follow up.   HPI: Feels well today Vision is stable at this time. She continues to have trouble seeing at bottom of BL visional field. 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 She has also has cataracts.  She is closely following with [facility] ophthalmology Dr Dora Gallo , follow up 05/03/23, Dr Ardell Koller ophthalmology      She request refill of tramadol .  She uses periodically for low back pain, unchanged.   She is not on repatha  due to cost  She complains of thick,  difficult to trim toenails. H/o ingrown toenail   Mammogram UTD  Allergies: Patient has no known allergies. Current Outpatient Medications on File Prior to Visit  Medication Sig Dispense Refill   acetaminophen  (TYLENOL ) 650 MG CR tablet Take 650 mg by mouth every 8 (eight) hours as needed for pain.     ascorbic acid (VITAMIN C) 500 MG tablet Take 500 mg by mouth daily.     cholecalciferol  (VITAMIN D3) 25 MCG (1000 UNIT) tablet Take 2,000 Units by mouth daily.     clotrimazole  (LOTRIMIN ) 1 % cream Apply 1 Application topically 2 (two) times daily. 30 g 1   cyanocobalamin  (DODEX ) 1000 MCG/ML injection INJECT 1ML ONCE PER MONTH 10 mL 2   diclofenac  (VOLTAREN ) 75 MG EC tablet Take 1 tablet (75 mg total) by mouth 2 (two) times daily. 60 tablet 3   esomeprazole (NEXIUM) 40 MG capsule Take 40 mg by mouth daily as needed.     hydrocortisone  2.5 % cream applied twice daily for one week under pannus and then once daily for one week, and every other day for one week. 30 g 2   magnesium 30 MG tablet Take 30 mg by mouth daily.     metolazone  (ZAROXOLYN ) 5 MG tablet TAKE 1 TABLET BY MOUTH DAILY AS NEEDED 90 tablet 1   ondansetron  (ZOFRAN -ODT) 4 MG disintegrating tablet Take 1 tablet (4 mg total) by mouth every 8 (eight) hours as needed. 20 tablet 0  potassium chloride  (KLOR-CON  M) 10 MEQ tablet Take 1 tablet by mouth twice daily. **Take with Metolazone  or Torsemide .** 180 tablet 1   rOPINIRole  (REQUIP ) 0.25 MG tablet TAKE 1 TABLET(0.25 MG) BY MOUTH AT BEDTIME AS NEEDED 90 tablet 3   Semaglutide , 1 MG/DOSE, 4 MG/3ML SOPN Inject 1 mg as directed once a week. (Patient taking differently: Inject 1 mg as directed once a week. PATIENT IS TAKING 2 MG WEEKLY INSTEAD OF 1 MG) 3 mL 3   Vitamin E 400 units TABS Take 400 Units by mouth daily.     Evolocumab  (REPATHA  SURECLICK) 140 MG/ML SOAJ Inject 140 mg into the skin every 14 (fourteen) days. (Patient not taking: Reported on 05/22/2023) 2 mL 2   Current  Facility-Administered Medications on File Prior to Visit  Medication Dose Route Frequency Provider Last Rate Last Admin   ipratropium-albuterol  (DUONEB) 0.5-2.5 (3) MG/3ML nebulizer solution 3 mL  3 mL Nebulization Once Laporcha Marchesi G, FNP        Review of Systems  Constitutional:  Negative for chills and fever.  Eyes:  Positive for visual disturbance.  Respiratory:  Negative for cough.   Cardiovascular:  Negative for chest pain and palpitations.  Gastrointestinal:  Negative for nausea and vomiting.  Musculoskeletal:  Positive for back pain.      Objective:    BP 130/70   Pulse 75   Temp 97.6 F (36.4 C) (Oral)   Ht 5' 1.5" (1.562 m)   Wt 186 lb 9.6 oz (84.6 kg)   SpO2 98%   BMI 34.69 kg/m  BP Readings from Last 3 Encounters:  05/22/23 130/70  01/19/23 112/72  08/22/22 110/70   Wt Readings from Last 3 Encounters:  05/22/23 186 lb 9.6 oz (84.6 kg)  01/19/23 184 lb 9.6 oz (83.7 kg)  11/29/22 180 lb (81.6 kg)    Physical Exam Vitals reviewed.  Constitutional:      Appearance: She is well-developed.  Eyes:     Conjunctiva/sclera: Conjunctivae normal.  Cardiovascular:     Rate and Rhythm: Normal rate and regular rhythm.     Pulses: Normal pulses.     Heart sounds: Normal heart sounds.  Pulmonary:     Effort: Pulmonary effort is normal.     Breath sounds: Normal breath sounds. No wheezing, rhonchi or rales.  Skin:    General: Skin is warm and dry.     Comments: Bilateral toenail beds this feet are dysmorphic, thickened yellow in color  Neurological:     Mental Status: She is alert.  Psychiatric:        Speech: Speech normal.        Behavior: Behavior normal.        Thought Content: Thought content normal.

## 2023-05-23 ENCOUNTER — Encounter: Payer: Self-pay | Admitting: Family

## 2023-05-24 DIAGNOSIS — L608 Other nail disorders: Secondary | ICD-10-CM | POA: Insufficient documentation

## 2023-05-24 MED ORDER — TORSEMIDE 20 MG PO TABS: 20.0000 mg | ORAL_TABLET | Freq: Every day | ORAL | 0 refills | Status: AC | PRN

## 2023-05-24 MED ORDER — TRAMADOL HCL 50 MG PO TABS
ORAL_TABLET | ORAL | 1 refills | Status: DC
Start: 2023-05-24 — End: 2023-10-02

## 2023-05-24 NOTE — Assessment & Plan Note (Signed)
 Chronic, symptomatically stable.  Refilled tramadol  50mg  for prn use

## 2023-05-24 NOTE — Assessment & Plan Note (Signed)
 Appearance of onychomycosis Referral to podiatry.

## 2023-05-24 NOTE — Assessment & Plan Note (Signed)
 Statin intolerant.  Repatha  140mg  has become expensive.  Referral to pharmacy in regards to patient assistance.

## 2023-05-26 ENCOUNTER — Telehealth: Payer: Self-pay

## 2023-05-26 NOTE — Progress Notes (Signed)
 Care Guide Pharmacy Note  05/26/2023 Name: Morgan Hurst MRN: [id number] DOB: 1945/06/16  Referred By: Calista Catching, FNP Reason for referral: Complex Care Management (Outreach to schedule with pharm d )   Morgan Hurst is a 78 y.o. year old female who is a primary care patient of Calista Catching, FNP.  Morgan Hurst was referred to the pharmacist for assistance related to: HLD  An unsuccessful telephone outreach was attempted today to contact the patient who was referred to the pharmacy team for assistance with medication assistance. Additional attempts will be made to contact the patient.  Lenton Rail , RMA     [facility]  [facility] Guide  Direct Dial: [phone]  Website: [email or web address]

## 2023-06-06 ENCOUNTER — Ambulatory Visit: Admitting: Podiatry

## 2023-06-06 DIAGNOSIS — H4710 Unspecified papilledema: Secondary | ICD-10-CM | POA: Diagnosis not present

## 2023-06-06 DIAGNOSIS — H25813 Combined forms of age-related cataract, bilateral: Secondary | ICD-10-CM | POA: Diagnosis not present

## 2023-06-07 ENCOUNTER — Ambulatory Visit (INDEPENDENT_AMBULATORY_CARE_PROVIDER_SITE_OTHER)

## 2023-06-07 ENCOUNTER — Ambulatory Visit: Admitting: Family Medicine

## 2023-06-07 ENCOUNTER — Encounter: Payer: Self-pay | Admitting: Family Medicine

## 2023-06-07 VITALS — BP 108/70 | HR 77 | Ht 61.0 in | Wt 188.0 lb

## 2023-06-07 DIAGNOSIS — M7061 Trochanteric bursitis, right hip: Secondary | ICD-10-CM

## 2023-06-07 DIAGNOSIS — M47816 Spondylosis without myelopathy or radiculopathy, lumbar region: Secondary | ICD-10-CM | POA: Diagnosis not present

## 2023-06-07 DIAGNOSIS — M25551 Pain in right hip: Secondary | ICD-10-CM | POA: Diagnosis not present

## 2023-06-07 DIAGNOSIS — M160 Bilateral primary osteoarthritis of hip: Secondary | ICD-10-CM | POA: Diagnosis not present

## 2023-06-07 NOTE — Progress Notes (Signed)
 [facility] [address] [place] [address] Phone: [phone] Subjective:   Morgan Hurst, am serving as a scribe for Dr. Ronnell Hurst.  I'm seeing this patient by the request  of:  Morgan Catching, FNP  CC: Right hip pain  NUU:[id number]  10/23/2020 Injection given today.  Patient tolerated the procedure well.  Hopefully will make significant improvement.  He does have known spinal stenosis and this could be potential radicular symptoms as well.  We discussed with patient though that likely secondary to #he has been ambulating to favor her left knee this could have caused an exacerbation.  Patient will follow-up with me again in 6 weeks.   Updated 06/07/2023 Morgan Hurst is a 78 y.o. female coming in with complaint of R hip and back pain. Started a couple of weeks ago in April and has been getting worse since. R Mt spotted fever. Stroke in optic nerve. When she topped diclofenac  and started tramadol  got real bad. Putting pressure on the leg makes pain worse. Diclofenac  with tramadol  and 2 tylenol  arthritis.       Past Medical History:  Diagnosis Date   Arthritis    lower back, right hip   B12 deficiency    Back pain    Borderline diabetes    PCP STARTED PT ON METFORMIN  IN June 27, 2014 DUE TO ELEVATED GLUCOSE    Complication of anesthesia    FIGHTING WHEN WAKING UP FROM ANESTHESIA   Constipation    Diabetes mellitus without complication (HCC)    Endometriosis    Fatty liver    Gallbladder problem    GERD (gastroesophageal reflux disease)    Heart murmur    High cholesterol    Hip pain    Joint pain    Kidney stones    Leg weakness    Orthodontics    top front 3 teeth caps and bridge   Prediabetes    RA (rheumatoid arthritis) (HCC)    SOB (shortness of breath)    Stomach ulcer    Swelling    Vitamin D  deficiency    Past Surgical History:  Procedure Laterality Date   ABDOMINAL HYSTERECTOMY  age 45   ANKLE SURGERY      CHOLECYSTECTOMY N/A 04/10/2015   Procedure: LAPAROSCOPIC CHOLECYSTECTOMY WITH INTRAOPERATIVE CHOLANGIOGRAM;  Surgeon: Marshall Skeeter, MD;  Location: [facility] ORS;  Service: General;  Laterality: N/A;   COLONOSCOPY WITH PROPOFOL  N/A 07/10/2017   Procedure: COLONOSCOPY WITH PROPOFOL ;  Surgeon: Luke Salaam, MD;  Location: [facility];  Service: Gastroenterology;  Laterality: N/A;   ESOPHAGOGASTRODUODENOSCOPY N/A 05/12/2015   Procedure: ESOPHAGOGASTRODUODENOSCOPY (EGD);  Surgeon: Stephens Eis, MD;  Location: [facility];  Service: Gastroenterology;  Laterality: N/A;   VAGINAL DELIVERY  2   Social History   Socioeconomic History   Marital status: Widowed    Spouse name: Not on file   Number of children: 2   Years of education: Not on file   Highest education level: Not on file  Occupational History   Occupation: Retired  Tobacco Use   Smoking status: Never   Smokeless tobacco: Never  Vaping Use   Vaping status: Never Used  Substance and Sexual Activity   Alcohol use: No   Drug use: No   Sexual activity: Not Currently  Other Topics Concern   Not on file  Social History Narrative   Lives in [place].       Husband passed away 27-Jun-2014 from PNA.  Work - retired from Dance movement psychotherapist      Diet - regular; working on [employer]   Exercise - walks occasionally, limited by fatigue. Gardens and takes care of her 56 year old grandchild.    Social Drivers of Corporate investment banker Strain: Low Risk  (11/29/2022)   Overall Financial Resource Strain (CARDIA)    Difficulty of Paying Living Expenses: Not hard at all  Food Insecurity: No Food Insecurity (11/29/2022)   Hunger Vital Sign    Worried About Running Out of Food in the Last Year: Never true    Ran Out of Food in the Last Year: Never true  Transportation Needs: No Transportation Needs (11/29/2022)   PRAPARE - Administrator, Civil Service (Medical): No    Lack of Transportation (Non-Medical): No  Physical  Activity: Inactive (11/29/2022)   Exercise Vital Sign    Days of Exercise per Week: 0 days    Minutes of Exercise per Session: 0 min  Stress: No Stress Concern Present (11/29/2022)   [employer] of Occupational Health - Occupational Stress Questionnaire    Feeling of Stress : Not at all  Social Connections: Moderately Isolated (11/29/2022)   Social Connection and Isolation Panel [NHANES]    Frequency of Communication with Friends and Family: More than three times a week    Frequency of Social Gatherings with Friends and Family: More than three times a week    Attends Religious Services: More than 4 times per year    Active Member of [employer] or Organizations: No    Attends Banker Meetings: Never    Marital Status: Widowed   No Known Allergies Family History  Problem Relation Age of Onset   Diabetes Mother    Cancer Mother        colon and breast   Breast cancer Mother    Liver disease Mother    Cancer Father        Leukemia   Cancer Brother        colon and lung   Colon cancer Brother    Cholelithiasis Daughter    Heart disease Paternal Grandfather    Cancer Other 29       colon   Thyroid  cancer Neg Hx     Current Outpatient Medications (Endocrine & Metabolic):    Semaglutide , 1 MG/DOSE, 4 MG/3ML SOPN, Inject 1 mg as directed once a week. (Patient taking differently: Inject 1 mg as directed once a week. PATIENT IS TAKING 2 MG WEEKLY INSTEAD OF 1 MG)   Current Outpatient Medications (Cardiovascular):    Evolocumab  (REPATHA  SURECLICK) 140 MG/ML SOAJ, Inject 140 mg into the skin every 14 (fourteen) days. (Patient not taking: Reported on 05/22/2023)   metolazone  (ZAROXOLYN ) 5 MG tablet, TAKE 1 TABLET BY MOUTH DAILY AS NEEDED   torsemide  (DEMADEX ) 20 MG tablet, Take 1 tablet (20 mg total) by mouth daily as needed.    Current Facility-Administered Medications (Respiratory):    ipratropium-albuterol  (DUONEB) 0.5-2.5 (3) MG/3ML nebulizer solution 3 mL  Current  Outpatient Medications (Analgesics):    acetaminophen  (TYLENOL ) 650 MG CR tablet, Take 650 mg by mouth every 8 (eight) hours as needed for pain.   diclofenac  (VOLTAREN ) 75 MG EC tablet, Take 1 tablet (75 mg total) by mouth 2 (two) times daily.   traMADol  (ULTRAM ) 50 MG tablet, TAKE 1 TABLET(50 MG) BY MOUTH DAILY AS NEEDED   Current Outpatient Medications (Hematological):    cyanocobalamin  (DODEX ) 1000 MCG/ML injection, INJECT 1ML  ONCE PER MONTH   Current Outpatient Medications (Other):    ascorbic acid (VITAMIN C) 500 MG tablet, Take 500 mg by mouth daily.   cholecalciferol  (VITAMIN D3) 25 MCG (1000 UNIT) tablet, Take 2,000 Units by mouth daily.   clotrimazole  (LOTRIMIN ) 1 % cream, Apply 1 Application topically 2 (two) times daily.   esomeprazole (NEXIUM) 40 MG capsule, Take 40 mg by mouth daily as needed.   hydrocortisone  2.5 % cream, applied twice daily for one week under pannus and then once daily for one week, and every other day for one week.   magnesium 30 MG tablet, Take 30 mg by mouth daily.   ondansetron  (ZOFRAN -ODT) 4 MG disintegrating tablet, Take 1 tablet (4 mg total) by mouth every 8 (eight) hours as needed.   potassium chloride  (KLOR-CON  M) 10 MEQ tablet, Take 1 tablet by mouth twice daily. **Take with Metolazone  or Torsemide .**   rOPINIRole  (REQUIP ) 0.25 MG tablet, TAKE 1 TABLET(0.25 MG) BY MOUTH AT BEDTIME AS NEEDED   Vitamin E 400 units TABS, Take 400 Units by mouth daily.    Reviewed prior external information including notes and imaging from  primary care provider As well as notes that were available from care everywhere and other healthcare systems.  Past medical history, social, surgical and family history all reviewed in electronic medical record.  No pertanent information unless stated regarding to the chief complaint.   Review of Systems:  No headache, visual changes, nausea, vomiting, diarrhea, constipation, dizziness, abdominal pain, skin rash, fevers, chills,  night sweats, weight loss, swollen lymph nodes, body aches, joint swelling, chest pain, shortness of breath, mood changes. POSITIVE muscle aches, fatigue  Objective  Blood pressure 108/70, pulse 77, height 5\' 1"  (1.549 m), weight 188 lb (85.3 kg), SpO2 98%.   General: No apparent distress alert and oriented x3 mood and affect normal, dressed appropriately.  Respiratory: Patient's speak in full sentences and does not appear short of breath  Cardiovascular: No lower extremity edema, non tender, no erythema  Patient does have severe pain over the right greater trochanteric area.  Mild increased discomfort with straight leg test but severe pain with trying to do [facility] test.  Some limited internal range of motion of the right hip but no pain in the groin.    After verbal consent patient was prepped with alcohol swab and with a 21-gauge 2 inch needle injected into the right greater trochanteric area with 2 cc of 0.5% Marcaine and 1 cc of Kenalog  40 mg/mL.  No blood loss.  Band-Aid placed.  Postinjection instructions given   Impression and Recommendations:     The above documentation has been reviewed and is accurate and complete Scot Shiraishi M Doreene Forrey, DO

## 2023-06-07 NOTE — Patient Instructions (Addendum)
 Injection in hip today Good to see you! Do prescribed exercises at least 3x a week Xray today See you again in 6 weeks

## 2023-06-07 NOTE — Assessment & Plan Note (Addendum)
 Chronic problem with exacerbation.  Patient has been having difficulty with some of her other health issues and unable to take anything else at the moment.  Unable to take anti-inflammatories anymore at the moment.  Having great follow-up with her primary care provider for her other ailments.  Do feel right now with the other comorbidities that the injection was more beneficial.  Social determinant of health includes inactivity recently.  Follow-up with me again in 6 to 8 weeks otherwise x-rays ordered today as well to further evaluate for any bony abnormality that could be potentially contributing.

## 2023-06-08 NOTE — Progress Notes (Signed)
 Care Guide Pharmacy Note  06/08/2023 Name: [name] MRN: [id number] DOB: 02/06/1945  Referred By: Calista Catching, FNP Reason for referral: Complex Care Management (Outreach to schedule with pharm d )   Morgan Hurst is a 78 y.o. year old female who is a primary care patient of Calista Catching, FNP.  Tressie Ragin Sando was referred to the pharmacist for assistance related to: DMII  Successful contact was made with the patient to discuss pharmacy services including being ready for the pharmacist to call at least 5 minutes before the scheduled appointment time and to have medication bottles and any blood pressure readings ready for review. The patient agreed to meet with the pharmacist via telephone visit on (date/time).06/19/2023  Lenton Rail , RMA     [place]  [facility], [facility] Guide  Direct Dial: [phone]  Website: [email or web address]

## 2023-06-13 ENCOUNTER — Other Ambulatory Visit (INDEPENDENT_AMBULATORY_CARE_PROVIDER_SITE_OTHER): Admitting: Pharmacist

## 2023-06-13 DIAGNOSIS — I509 Heart failure, unspecified: Secondary | ICD-10-CM | POA: Diagnosis not present

## 2023-06-13 DIAGNOSIS — E785 Hyperlipidemia, unspecified: Secondary | ICD-10-CM

## 2023-06-13 DIAGNOSIS — E119 Type 2 diabetes mellitus without complications: Secondary | ICD-10-CM | POA: Diagnosis not present

## 2023-06-13 DIAGNOSIS — H4710 Unspecified papilledema: Secondary | ICD-10-CM | POA: Diagnosis not present

## 2023-06-13 DIAGNOSIS — E538 Deficiency of other specified B group vitamins: Secondary | ICD-10-CM | POA: Diagnosis not present

## 2023-06-13 NOTE — Progress Notes (Signed)
   06/13/2023 Name: Morgan Hurst MRN: [id number] DOB: [birth year]  Subjective  Chief Complaint  Patient presents with   Hyperlipidemia   Care Team: Primary Care Provider: Calista Catching, FNP  Reason for visit: ?  Morgan Hurst is a 78 y.o. female who presents today for a telephone visit with the pharmacist due to medication access concerns regarding their Repatha . ?  Medication Access: ?  Reports that all medications are not affordable.  Has been taking Praluent  without concerns though at the start of the year, reports cost of Repatha  was >$500 when she presented to the pharmacy.   Prescription drug coverage: YES Payor: Multimedia programmer / Plan: UHC MEDICARE / Product Type: *No Product type* / .  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 Was told she makes too little to file taxes the past 2 years.   Medicare LIS Eligible: No (monthly income exceeded)  Assessment and Plan:   1. Medication Access Cost of medication has increased due to insurance deductible. Cost will reduce to $47/month once the remaining $225 is spent.  Discussed eligibility for Healthwell grant for cholesterol medications. Based on estimated income, she should qualify.  Provided grant info to her pharmacy, confirmed copay - $0. Will be ordered for Friday.  [employer] - Initial enrollment 2025 Medication(s): All cholesterol medications (Repatha )  Application Status:  Approved for initial enrollment   HealthWell ID: [id number] Fund: Hypercholesterolemia - Medicare Access Assistance Type: Co-pay Start Date: 05/14/2023 End Date: 05/12/2024    Rx Card: Card No.  [id number] RX BIN:  [id number] PCN:  [id number] Group:   [id number]    Daron Ellen, PharmD Clinical Pharmacist [facility] [phone]  Future Appointments  Date Time Provider Department Center  06/20/2023  9:30 AM Velma Ghazi, DPM [facility]  07/14/2023  8:45 AM Isidro Margo, DO [facility] None  12/01/2023 11:30 AM [facility] ANNUAL WELLNESS VISIT [facility]    Daron Ellen, PharmD Clinical Pharmacist [facility] [phone]

## 2023-06-13 NOTE — Patient Instructions (Signed)
 Ms. TARSHA BLANDO,   It was a pleasure to speak with you today! As we discussed:?   Start Repatha  injection once under the skin every 14 days. This is very similar to your previous injection (Praluent ) Repatha  and Praluent  work in the same way and are in the same drug class.   I agree that your cost was expensive due to a Medication deductible, however, we should be able to get you the Repatha  for free regardless.  Prescription drug coverage: YES Payor: Multimedia programmer Summary of Benefit: Medication deductible remaining: $225.32  Copay after reaching deductible: $47 per month for preferred brand medication such as Repatha     Healthwell Foundation Norberta Beans  You have been enrolled in the [employer] Hyper[employer] [facility]. This grant helps Medicare patients pay for their cholesterol medications at the pharmacy.   Your HealthWell ID: [id number]  HealthWell Foundation: Website: https://www.healthwellfoundation.org/fund/hypercholesterolemia-medicare-access/ Phone: [phone] M-F 9am-5pm   This program works similar to a Biochemist, clinical card or insurance card. Your pharmacy will continue to bill your cholesterol medicine through Medicare, then they will bill the HealthWell savings card to cover your copay.   Your HealthWell savings card is electronic (not a physical card).  Please provide the following details to your pharmacist:   Your HealthWell Savings Card Numbers (I have already provided this to your pharmacy):   Rx Card: Card No.  [id number] RX BIN:  [id number] PCN:  [id number] Group:  [id number]   Your Repatha  should remain $0/month. If you have any issues at the pharmacy, be sure they are applying your savings card.   Please reach out prior to your next scheduled appointment should you have any questions or concerns.  Thank you!   Future Appointments  Date Time Provider Department Center  06/20/2023  9:30 AM Velma Ghazi, DPM [facility]  07/14/2023   8:45 AM Isidro Margo, DO [facility] None  12/01/2023 11:30 AM [facility] ANNUAL WELLNESS VISIT [facility]    Daron Ellen, PharmD Clinical Pharmacist [facility] [phone]

## 2023-06-16 ENCOUNTER — Ambulatory Visit: Payer: Self-pay | Admitting: Family Medicine

## 2023-06-20 ENCOUNTER — Ambulatory Visit: Admitting: Podiatry

## 2023-06-20 DIAGNOSIS — M79674 Pain in right toe(s): Secondary | ICD-10-CM

## 2023-06-20 DIAGNOSIS — M79675 Pain in left toe(s): Secondary | ICD-10-CM

## 2023-06-20 DIAGNOSIS — B351 Tinea unguium: Secondary | ICD-10-CM | POA: Diagnosis not present

## 2023-06-20 NOTE — Progress Notes (Signed)
  Subjective:  Patient ID: Morgan Hurst, female    DOB: 1945-11-20,  MRN: [id number]  Chief Complaint  Patient presents with   Nail Problem    Bilateral hallux nail discomfort    78 y.o. female returns for the above complaint.  Patient presents with thickened elongated dystrophic mycotic toenails x 10 mild pain on palpation of hurts with ambulation worse with pressure patient would like to have them debrided down she is not a regular self denies any other acute complaints  Objective:  There were no vitals filed for this visit. Podiatric Exam: Vascular: dorsalis pedis and posterior tibial pulses are palpable bilateral. Capillary return is immediate. Temperature gradient is WNL. Skin turgor WNL  Sensorium: Normal Semmes Weinstein monofilament test. Normal tactile sensation bilaterally. Nail Exam: Pt has thick disfigured discolored nails with subungual debris noted bilateral entire nail hallux through fifth toenails.  Pain on palpation to the nails. Ulcer Exam: There is no evidence of ulcer or pre-ulcerative changes or infection. Orthopedic Exam: Muscle tone and strength are WNL. No limitations in general ROM. No crepitus or effusions noted.  Skin: No Porokeratosis. No infection or ulcers    Assessment & Plan:   1. Pain due to onychomycosis of toenails of both feet     Patient was evaluated and treated and all questions answered.  Onychomycosis with pain  -Nails palliatively debrided as below. -Educated on self-care  Procedure: Nail Debridement Rationale: pain  Type of Debridement: manual, sharp debridement. Instrumentation: Nail nipper, rotary burr. Number of Nails: 10  Procedures and Treatment: Consent by patient was obtained for treatment procedures. The patient understood the discussion of treatment and procedures well. All questions were answered thoroughly reviewed. Debridement of mycotic and hypertrophic toenails, 1 through 5 bilateral and clearing of subungual debris.  No ulceration, no infection noted.  Return Visit-Office Procedure: Patient instructed to return to the office for a follow up visit 3 months for continued evaluation and treatment.  Tinnie Forehand, DPM    Return in about 3 months (around 09/20/2023) for rfc .

## 2023-06-27 ENCOUNTER — Telehealth: Payer: Self-pay

## 2023-06-27 NOTE — Telephone Encounter (Signed)
 Pt was notified that her Ozempic  , 2 mg (4 boxes) from pt assistance  is here in office waiting for pickup. Pt states that she will pick up on 06/28/23

## 2023-07-06 DIAGNOSIS — H4713 Papilledema associated with retinal disorder: Secondary | ICD-10-CM | POA: Diagnosis not present

## 2023-07-06 DIAGNOSIS — R7303 Prediabetes: Secondary | ICD-10-CM | POA: Diagnosis not present

## 2023-07-06 DIAGNOSIS — H2513 Age-related nuclear cataract, bilateral: Secondary | ICD-10-CM | POA: Diagnosis not present

## 2023-07-14 ENCOUNTER — Ambulatory Visit: Admitting: Family Medicine

## 2023-07-17 DIAGNOSIS — E1169 Type 2 diabetes mellitus with other specified complication: Secondary | ICD-10-CM | POA: Diagnosis not present

## 2023-07-17 DIAGNOSIS — M150 Primary generalized (osteo)arthritis: Secondary | ICD-10-CM | POA: Diagnosis not present

## 2023-07-17 DIAGNOSIS — E119 Type 2 diabetes mellitus without complications: Secondary | ICD-10-CM | POA: Diagnosis not present

## 2023-08-17 DIAGNOSIS — M150 Primary generalized (osteo)arthritis: Secondary | ICD-10-CM | POA: Diagnosis not present

## 2023-08-17 DIAGNOSIS — E119 Type 2 diabetes mellitus without complications: Secondary | ICD-10-CM | POA: Diagnosis not present

## 2023-08-17 DIAGNOSIS — E1169 Type 2 diabetes mellitus with other specified complication: Secondary | ICD-10-CM | POA: Diagnosis not present

## 2023-08-24 ENCOUNTER — Ambulatory Visit: Payer: Self-pay

## 2023-08-24 NOTE — Telephone Encounter (Signed)
 Spoke to pt scheduled her to see Onesimo on Mon, first available appt in office, but informed her that she can go to [facility] walk in @ [place] [facility] if she could not wait

## 2023-08-24 NOTE — Telephone Encounter (Signed)
 FYI Only or Action Required?: Action required by provider: request for appointment.  Patient was last seen in primary care on 05/22/2023 by [name], FNP.  Called Nurse Triage reporting Shoulder Pain.  Symptoms began several days ago.  Interventions attempted: Nothing.  Symptoms are: unchanged.  Triage Disposition: See PCP When Office is Open (Within 3 Days)  Patient/caregiver understands and will follow disposition?: Yes, will follow disposition  Copied from CRM [id number]. Topic: Clinical - Red Word Triage >> Aug 24, 2023 10:22 AM Suzen RAMAN wrote: Red Word that prompted transfer to Nurse Triage: [facility] and back pain Reason for Disposition  [1] MODERATE pain (e.g., interferes with normal activities) AND [2] present > 3 days  Answer Assessment - Initial Assessment Questions 1. ONSET: When did the pain start?     About 5 days 2. LOCATION: Where is the pain located?    R Shoulder radiates to neck and back now it is in the L shoulder, now back to the R shoulder 3. PAIN: How bad is the pain? (Scale 1-10; or mild, moderate, severe)     8 4. WORK OR EXERCISE: Has there been any recent work or exercise that involved this part of the body?     denies 5. CAUSE: What do you think is causing the shoulder pain?     Arthritis and rocky mountain spotted fever 6. OTHER SYMPTOMS: Do you have any other symptoms? (e.g., neck pain, swelling, rash, fever, numbness, weakness)     Radiates to the neck and back Denies SOB. Pt states worse with movement of the shoulder. Pt requests to only see her PCP, routing to the office for scheduling, next available appt for PCP is 8/28.  Protocols used: Shoulder Pain-A-AH

## 2023-08-28 ENCOUNTER — Ambulatory Visit: Admitting: Internal Medicine

## 2023-08-30 NOTE — Telephone Encounter (Signed)
 Spoke to pt she is feeling better now due to using ice and heat and otc arthritis creams, I did  inform her that she can go to [facility] as they have walk in appts as well. Pt will call back if she needs to but for now she is better

## 2023-09-09 ENCOUNTER — Other Ambulatory Visit: Payer: Self-pay | Admitting: Family

## 2023-09-09 DIAGNOSIS — E785 Hyperlipidemia, unspecified: Secondary | ICD-10-CM

## 2023-09-15 ENCOUNTER — Telehealth: Payer: Self-pay | Admitting: Family

## 2023-09-15 NOTE — Telephone Encounter (Signed)
 Call pt   Confirm what dose of ozempic  she is on ; I want to ensure ozempic  2mg 

## 2023-09-15 NOTE — Telephone Encounter (Signed)
 Called PT to confirm she is on the correct dose. She is taking Ozempic  2MG 

## 2023-09-20 DIAGNOSIS — E65 Localized adiposity: Secondary | ICD-10-CM | POA: Diagnosis not present

## 2023-09-20 DIAGNOSIS — E1169 Type 2 diabetes mellitus with other specified complication: Secondary | ICD-10-CM | POA: Diagnosis not present

## 2023-09-21 NOTE — Telephone Encounter (Signed)
 Faxed on 09/20/23

## 2023-09-25 ENCOUNTER — Ambulatory Visit (INDEPENDENT_AMBULATORY_CARE_PROVIDER_SITE_OTHER): Admitting: Podiatry

## 2023-09-25 DIAGNOSIS — M79674 Pain in right toe(s): Secondary | ICD-10-CM

## 2023-09-25 DIAGNOSIS — B351 Tinea unguium: Secondary | ICD-10-CM

## 2023-09-25 DIAGNOSIS — M79675 Pain in left toe(s): Secondary | ICD-10-CM | POA: Diagnosis not present

## 2023-09-27 ENCOUNTER — Encounter: Payer: Self-pay | Admitting: Podiatry

## 2023-09-27 NOTE — Progress Notes (Signed)
  Subjective:  Patient ID: Morgan Hurst, female    DOB: May 09, 1945,  MRN: [id number]  78 y.o. female presents painful elongated mycotic toenails 1-5 bilaterally which are tender when wearing enclosed shoe gear. Pain is relieved with periodic professional debridement. Chief Complaint  Patient presents with   Nail Problem    DRFC   New problem(s): None   PCP is [name], FNP , and last visit was May 22, 2023.  No Known Allergies  Review of Systems: Negative except as noted in the HPI.   Objective:  KENLEY RETTINGER is a pleasant 78 y.o. female in NAD. AAO x 3.  Vascular Examination: Vascular status intact b/l with palpable pedal pulses. CFT immediate b/l. Pedal hair present. No edema. No pain with calf compression b/l. Skin temperature gradient WNL b/l. No varicosities noted. No cyanosis or clubbing noted.  Neurological Examination: Sensation grossly intact b/l with 10 gram monofilament. Vibratory sensation intact b/l.  Dermatological Examination: Pedal skin with normal turgor, texture and tone b/l. No open wounds nor interdigital macerations noted. Toenails 1-5 b/l thick, discolored, elongated with subungual debris and pain on dorsal palpation. No hyperkeratotic lesions noted b/l.   Musculoskeletal Examination: Muscle strength 5/5 to b/l LE.  No pain, crepitus noted b/l. No gross pedal deformities. Patient ambulates independently without assistive aids.   Radiographs: None  Last A1c:      Latest Ref Rng & Units 05/22/2023   12:49 PM 02/06/2023    8:43 AM  Hemoglobin A1C  Hemoglobin-A1c 4.6 - 6.5 % 5.4  5.7     Assessment:   1. Pain due to onychomycosis of toenails of both feet    Plan:  Patient was evaluated and treated. All patient's and/or POA's questions/concerns addressed on today's visit. Toenails 1-5 debrided in length and girth without incident. Continue soft, supportive shoe gear daily. Report any pedal injuries to medical professional. Call office if  there are any questions/concerns. -Patient/POA to call should there be question/concern in the interim.  Return in about 3 months (around 12/25/2023).  Delon LITTIE Merlin, DPM      [facility] LOCATION: [address], [place] [address]                   Office [phone]   [facility] LOCATION: [address] [place], [place] [address] Office [phone]

## 2023-10-02 ENCOUNTER — Telehealth: Payer: Self-pay

## 2023-10-02 ENCOUNTER — Other Ambulatory Visit: Payer: Self-pay | Admitting: Family

## 2023-10-02 DIAGNOSIS — B372 Candidiasis of skin and nail: Secondary | ICD-10-CM

## 2023-10-02 DIAGNOSIS — E785 Hyperlipidemia, unspecified: Secondary | ICD-10-CM

## 2023-10-02 DIAGNOSIS — M48062 Spinal stenosis, lumbar region with neurogenic claudication: Secondary | ICD-10-CM

## 2023-10-02 NOTE — Telephone Encounter (Signed)
 Copied from CRM [id number]. Topic: Clinical - Prescription Issue >> Oct 02, 2023 12:03 PM Harlene ORN wrote: Reason for CRM:  Koren - [facility] Repatha 's manufacturer  has changed their formula. Needs a new E-Prescription to request a latex-free version of the medication. Phone: [phone]

## 2023-10-03 ENCOUNTER — Encounter: Payer: Self-pay | Admitting: Pharmacist

## 2023-10-03 MED ORDER — REPATHA SURECLICK 140 MG/ML ~~LOC~~ SOAJ: 140.0000 mg | SUBCUTANEOUS | 2 refills | Status: DC

## 2023-10-03 NOTE — Addendum Note (Signed)
 Addended by: [name] on: 10/03/2023 02:03 PM   Modules accepted: Orders

## 2023-10-03 NOTE — Progress Notes (Signed)
 Chart Review Reason: Medication Access  Summary: Called pharmacy to ensure updated prescription was received regarding Repatha  latex free.   Pharmacist confirms that updated prescription was received and is acceptable.   Requested that new Rx is also billed through Medicare D with the Healthwell grant secondary for a copay of $0.

## 2023-10-03 NOTE — Telephone Encounter (Signed)
 Pt has been notified.

## 2023-10-03 NOTE — Telephone Encounter (Signed)
 Collaborated with pharmD Manuelita with substitution permitted; latex free  Rx resent

## 2023-10-05 ENCOUNTER — Ambulatory Visit: Admitting: Family

## 2023-10-05 ENCOUNTER — Encounter: Payer: Self-pay | Admitting: Family

## 2023-10-05 ENCOUNTER — Ambulatory Visit (INDEPENDENT_AMBULATORY_CARE_PROVIDER_SITE_OTHER): Admitting: Family

## 2023-10-05 VITALS — BP 126/80 | HR 79 | Temp 97.7°F | Ht 61.5 in | Wt 187.2 lb

## 2023-10-05 DIAGNOSIS — H47019 Ischemic optic neuropathy, unspecified eye: Secondary | ICD-10-CM | POA: Diagnosis not present

## 2023-10-05 DIAGNOSIS — E669 Obesity, unspecified: Secondary | ICD-10-CM

## 2023-10-05 DIAGNOSIS — M542 Cervicalgia: Secondary | ICD-10-CM

## 2023-10-05 DIAGNOSIS — Z23 Encounter for immunization: Secondary | ICD-10-CM

## 2023-10-05 NOTE — Assessment & Plan Note (Addendum)
 Chronic, suboptimal control.  Reviewed prior cervical MRI from 2022.  Tramadol  is effective but causes constipation. Diclofenac  was discontinued due to stroke risk. Consider Cymbalta  for pain management.; she declines today.  Counseled on how to take Tylenol  arthritis 650 mg, up to 4 tablets per day, not exceeding 4000 mg in 24 hours. She declines tizanidine .  Continue tramadol  50 mg daily as needed

## 2023-10-05 NOTE — Patient Instructions (Addendum)
 As discussed, let's start by scheduling Tylenol  Arthritis which is a 650mg  tablet .   You may take 1-2 tablets every 8 hours ( scheduled) with maximum of 6 tablets per day. Most adults can safely take 4 pills total per day of Tylenol  Arthritis 650mg  tablet. Do not exceed 6 tablets a day of Tylenol  Arthritis 650mg  tablet   For example , you could take two tablets in the morning ( 8am) to start and we can increase from there.    Maximum daily dose of acetaminophen  4 g per day from all sources.  If you are taking another medication which includes acetaminophen  (Tylenol ) which may be in cough and cold preparations or pain medication such as Percocet, you will need to factor that into your total daily dose to be safe.  Please let me know if any questions  A great article regarding how to safely take and dose tylenol  found below.  Title : 'Acetaminophen  safety: Be cautious but not afraid'  https://www.health.[employer].edu/pain/acetaminophen -safety-be-cautious-but-not-afraid  Please use heat to your upper back.   If this doesn't help, we can still add on Cymbalta .   Most important to avoid all over the counter antiinflammatories ( such as Diclofenac , Aleve, Motrin, ibuprofen etc) due to history of stroke.   Cervical Strain and Sprain Rehab Ask your health care provider which exercises are safe for you. Do exercises exactly as told by your health care provider and adjust them as directed. It is normal to feel mild stretching, pulling, tightness, or discomfort as you do these exercises. Stop right away if you feel sudden pain or your pain gets worse. Do not begin these exercises until told by your health care provider. Stretching and range-of-motion exercises Cervical side bending  Using good posture, sit on a stable chair or stand up. Without moving your shoulders, slowly tilt your left / right ear to your shoulder until you feel a stretch in the neck muscles on the opposite side. You should be  looking straight ahead. Hold for __________ seconds. Repeat with the other side of your neck. Repeat __________ times. Complete this exercise __________ times a day. Cervical rotation  Using good posture, sit on a stable chair or stand up. Slowly turn your head to the side as if you are looking over your left / right shoulder. Keep your eyes level with the ground. Stop when you feel a stretch along the side and the back of your neck. Hold for __________ seconds. Repeat this by turning to your other side. Repeat __________ times. Complete this exercise __________ times a day. Thoracic extension and pectoral stretch  Roll a towel or a small blanket so it is about 4 inches (10 cm) in diameter. Lie down on your back on a firm surface. Put the towel in the middle of your back across your spine. It should not be under your shoulder blades. Put your hands behind your head and let your elbows fall out to your sides. Hold for __________ seconds. Repeat __________ times. Complete this exercise __________ times a day. Strengthening exercises Upper cervical flexion  Lie on your back with a thin pillow behind your head or a small, rolled-up towel under your neck. Gently tuck your chin toward your chest and nod your head down to look toward your feet. Do not lift your head off the pillow. Hold for __________ seconds. Release the tension slowly. Relax your neck muscles completely before you repeat this exercise. Repeat __________ times. Complete this exercise __________ times a day. Cervical  extension  Stand about 6 inches (15 cm) away from a wall, with your back facing the wall. Place a soft object, about 6-8 inches (15-20 cm) in diameter, between the back of your head and the wall. A soft object could be a small pillow, a ball, or a folded towel. Gently tilt your head back and press into the soft object. Keep your jaw and forehead relaxed. Hold for __________ seconds. Release the tension  slowly. Relax your neck muscles completely before you repeat this exercise. Repeat __________ times. Complete this exercise __________ times a day. Posture and body mechanics Body mechanics refer to the movements and positions of your body while you do your daily activities. Posture is part of body mechanics. Good posture and healthy body mechanics can help to relieve stress in your body's tissues and joints. Good posture means that your spine is in its natural S-curve position (your spine is neutral), your shoulders are pulled back slightly, and your head is not tipped forward. The following are general guidelines for using improved posture and body mechanics in your everyday activities. Sitting  When sitting, keep your spine neutral and keep your feet flat on the floor. Use a footrest, if needed, and keep your thighs parallel to the floor. Avoid rounding your shoulders. Avoid tilting your head forward. When working at a desk or a computer, keep your desk at a height where your hands are slightly lower than your elbows. Slide your chair under your desk so you are close enough to maintain good posture. When working at a computer, place your monitor at a height where you are looking straight ahead and you do not have to tilt your head forward or downward to look at the screen. Standing  When standing, keep your spine neutral and keep your feet about hip-width apart. Keep a slight bend in your knees. Your ears, shoulders, and hips should line up. When you do a task in which you stand in one place for a long time, place one foot up on a stable object that is 2-4 inches (5-10 cm) high, such as a footstool. This helps keep your spine neutral. Resting When lying down and resting, avoid positions that are most painful for you. Try to support your neck in a neutral position. You can use a contour pillow or a small rolled-up towel. Your pillow should support your neck but not push on it. This information is  not intended to replace advice given to you by your health care provider. Make sure you discuss any questions you have with your health care provider. Document Revised: 05/09/2022 Document Reviewed: 07/26/2021 Elsevier Patient Education  2024 [employer].

## 2023-10-05 NOTE — Progress Notes (Signed)
 Assessment & Plan:  Need for influenza vaccination -     Flu vaccine HIGH DOSE PF(Fluzone Trivalent)  Non-arteritic anterior ischemic optic neuropathy, unspecified laterality  Obesity (BMI 30-39.9) Assessment & Plan: Obesity is managed with Ozempic  at 2 mg. Weight remains stable with fluctuations. Continue Ozempic  2 mg under patient assistance program.    Neck pain Assessment & Plan: Chronic, suboptimal control.  Reviewed prior cervical MRI from 2022.  Tramadol  is effective but causes constipation. Diclofenac  was discontinued due to stroke risk. Consider Cymbalta  for pain management.; she declines today.  Counseled on how to take Tylenol  arthritis 650 mg, up to 4 tablets per day, not exceeding 4000 mg in 24 hours. She declines tizanidine .  Continue tramadol  50 mg daily as needed      Return precautions given.   Risks, benefits, and alternatives of the medications and treatment plan prescribed today were discussed, and patient expressed understanding.   Education regarding symptom management and diagnosis given to patient on AVS either electronically or printed.  Return in about 3 months (around 01/04/2024).  Rollene Northern, FNP  Subjective:    Patient ID: Morgan Hurst, female    DOB: August 14, 1945, 78 y.o.   MRN: [id number]  CC: Morgan Hurst is a 78 y.o. female who presents today for follow up.   HPI: HPI Discussed the use of AI scribe software for clinical note transcription with the patient, who gave verbal consent to proceed.  History of Present Illness   Morgan Hurst is a 78 year old female with arthritis who presents for follow-up regarding medication management.  She experiences worsening arthritis symptoms, including stiffness and pain, particularly upon waking. Previously, she used diclofenac  but discontinued it due to concerns about stroke risk. Currently, she takes tramadol , which alleviates pain but causes constipation and does not address stiffness.  She occasionally uses leftover diclofenac  for additional relief. Her arthritis affects multiple joints, causing 'ache and stiffness' from 'head to toe,' and she experiences neck popping and discomfort, which she attributes to arthritis. She previously received neck injections, which she found unpleasant but somewhat effective. Denies weakness, or numbness in arms.   She is on Ozempic  2mg  for weight management and has been attending a wellness center in [place]. She would to transfer their care to me.    She is active, engaging in activities like mowing the lawn and gardening, but her physical activity is limited by pain.             Compliant with repatha    Following Dr Barry HOUSTON last seen 06/06/23 H/o NAAION  MRI brain 03/04/21 1.No acute intracranial abnormality.  2.Subtle fluid within the bilateral optic nerve sheaths is at the upper limits of normal physiologic appearance, and there are no associated abnormalities of the globes or optic tracts.  3.Chronic-appearing microvascular white matter changes.   MRI cervical spine 03/2020  Congenitally small spinal canal with multilevel degenerative changes of the cervical spine as described above, worst at C4-5 where there is moderate spinal canal stenosis. 2. Multilevel neural foraminal narrowing, severe on the left at C3-4 and bilaterally at C5-6; moderate to severe right at C4-5 and on the left left at C6-7. Allergies: Patient has no known allergies. Current Outpatient Medications on File Prior to Visit  Medication Sig Dispense Refill   acetaminophen  (TYLENOL ) 650 MG CR tablet Take 650 mg by mouth every 8 (eight) hours as needed for pain.     ascorbic acid (VITAMIN C) 500 MG tablet Take 500  mg by mouth daily.     cholecalciferol  (VITAMIN D3) 25 MCG (1000 UNIT) tablet Take 2,000 Units by mouth daily.     clotrimazole  (LOTRIMIN ) 1 % cream Apply 1 Application topically 2 (two) times daily. 30 g 1   cyanocobalamin  (DODEX ) 1000  MCG/ML injection INJECT 1ML ONCE PER MONTH 10 mL 2   diclofenac  (VOLTAREN ) 75 MG EC tablet Take 1 tablet (75 mg total) by mouth 2 (two) times daily. 60 tablet 3   esomeprazole (NEXIUM) 40 MG capsule Take 40 mg by mouth daily as needed.     Evolocumab  (REPATHA  SURECLICK) 140 MG/ML SOAJ Inject 140 mg into the skin every 14 (fourteen) days. Substitution permitted; latex free. 2 mL 2   hydrocortisone  2.5 % cream APPLY TWICE DAILY FOR ONE WEEK TO THE AFFECTED AREA AND THEN ONCE DAIL FOR 1 WEEK, THEN EVERY OTHER DAY FOR 1 WEEK 30 g 2   magnesium 30 MG tablet Take 30 mg by mouth daily.     metolazone  (ZAROXOLYN ) 5 MG tablet TAKE 1 TABLET BY MOUTH DAILY AS NEEDED 90 tablet 1   ondansetron  (ZOFRAN -ODT) 4 MG disintegrating tablet Take 1 tablet (4 mg total) by mouth every 8 (eight) hours as needed. 20 tablet 0   potassium chloride  (KLOR-CON  M) 10 MEQ tablet Take 1 tablet by mouth twice daily. **Take with Metolazone  or Torsemide .** 180 tablet 1   rOPINIRole  (REQUIP ) 0.25 MG tablet TAKE 1 TABLET(0.25 MG) BY MOUTH AT BEDTIME AS NEEDED 90 tablet 3   Semaglutide , 1 MG/DOSE, 4 MG/3ML SOPN Inject 1 mg as directed once a week. (Patient taking differently: Inject 1 mg as directed once a week. PATIENT IS TAKING 2 MG WEEKLY INSTEAD OF 1 MG) 3 mL 3   torsemide  (DEMADEX ) 20 MG tablet Take 1 tablet (20 mg total) by mouth daily as needed. 90 tablet 0   traMADol  (ULTRAM ) 50 MG tablet TAKE 1 TABLET(50 MG) BY MOUTH DAILY AS NEEDED 30 tablet 2   Vitamin E 400 units TABS Take 400 Units by mouth daily.     Current Facility-Administered Medications on File Prior to Visit  Medication Dose Route Frequency Provider Last Rate Last Admin   ipratropium-albuterol  (DUONEB) 0.5-2.5 (3) MG/3ML nebulizer solution 3 mL  3 mL Nebulization Once Jaylon Grode G, FNP        Review of Systems  Constitutional:  Negative for chills and fever.  Respiratory:  Negative for cough.   Cardiovascular:  Negative for chest pain and palpitations.   Gastrointestinal:  Negative for nausea and vomiting.      Objective:    BP 126/80   Pulse 79   Temp 97.7 F (36.5 C) (Oral)   Ht 5' 1.5 (1.562 m)   Wt 187 lb 3.2 oz (84.9 kg)   SpO2 97%   BMI 34.80 kg/m  BP Readings from Last 3 Encounters:  10/05/23 126/80  06/07/23 108/70  05/22/23 130/70   Wt Readings from Last 3 Encounters:  10/05/23 187 lb 3.2 oz (84.9 kg)  06/07/23 188 lb (85.3 kg)  05/22/23 186 lb 9.6 oz (84.6 kg)    Physical Exam Vitals reviewed.  Constitutional:      Appearance: She is well-developed.  Eyes:     Conjunctiva/sclera: Conjunctivae normal.  Cardiovascular:     Rate and Rhythm: Normal rate and regular rhythm.     Pulses: Normal pulses.     Heart sounds: Normal heart sounds.  Pulmonary:     Effort: Pulmonary effort is normal.  Breath sounds: Normal breath sounds. No wheezing, rhonchi or rales.  Skin:    General: Skin is warm and dry.  Neurological:     Mental Status: She is alert.  Psychiatric:        Speech: Speech normal.        Behavior: Behavior normal.        Thought Content: Thought content normal.

## 2023-10-05 NOTE — Telephone Encounter (Signed)
 Please contact pt to begin filling out forms for ozempic  2mg ; I am not sure when her Pt assistance runs out.

## 2023-10-05 NOTE — Telephone Encounter (Signed)
 Per notes. Patient is covered thru 01/2024

## 2023-10-05 NOTE — Assessment & Plan Note (Addendum)
 Obesity is managed with Ozempic  at 2 mg. Weight remains stable with fluctuations. Continue Ozempic  2 mg under patient assistance program.

## 2023-10-06 ENCOUNTER — Ambulatory Visit: Admitting: Family Medicine

## 2023-10-13 ENCOUNTER — Other Ambulatory Visit: Payer: Self-pay | Admitting: Family

## 2023-10-13 DIAGNOSIS — R600 Localized edema: Secondary | ICD-10-CM

## 2023-10-19 ENCOUNTER — Other Ambulatory Visit: Payer: Self-pay

## 2023-10-24 ENCOUNTER — Encounter: Payer: Self-pay | Admitting: Pharmacist

## 2023-11-07 ENCOUNTER — Encounter: Payer: Self-pay | Admitting: Pharmacist

## 2023-11-07 NOTE — Progress Notes (Signed)
 CITLALIC NORLANDER                                          MRN: [id number]   11/07/2023   The [facility] Quality Team Specialist reviewed this patient medical record for the purposes of chart review for care gap closure. The following were reviewed: chart review for care gap closure-kidney health evaluation for diabetes:eGFR  and uACR.    [facility] Quality Team

## 2023-11-07 NOTE — Progress Notes (Signed)
 Mychart message sent to patient on 10/24/23 regarding changes to [employer] Patient Assistance Program in 2026. [facility] will no longer be offering Ozempic  or Rybelsus  to Medicare patients in 2026.   Mychart message not read as of 11/07/23. Letter sent to patient via mail.

## 2023-12-01 ENCOUNTER — Ambulatory Visit: Payer: Medicare Other | Admitting: *Deleted

## 2023-12-01 VITALS — Ht 61.5 in | Wt 180.0 lb

## 2023-12-01 DIAGNOSIS — Z0000 Encounter for general adult medical examination without abnormal findings: Secondary | ICD-10-CM | POA: Diagnosis not present

## 2023-12-01 DIAGNOSIS — Z1231 Encounter for screening mammogram for malignant neoplasm of breast: Secondary | ICD-10-CM

## 2023-12-01 NOTE — Patient Instructions (Addendum)
 Morgan Hurst,  Thank you for taking the time for your Medicare Wellness Visit. I appreciate your continued commitment to your health goals. Please review the care plan we discussed, and feel free to reach out if I can assist you further.  Please note that Annual Wellness Visits do not include a physical exam. Some assessments may be limited, especially if the visit was conducted virtually. If needed, we may recommend an in-person follow-up with your provider.  Ongoing Care Seeing your primary care provider every 3 to 6 months helps us  monitor your health and provide consistent, personalized care. You have an order for:  []   2D Mammogram  [x]   3D Mammogram  []   Bone Density     Please call for appointment:  [facility]  [address] [place] [address] [phone]  Managing Pain Without Opioids Opioids are strong medicines used to treat moderate to severe pain. For some people, especially those who have long-term (chronic) pain, opioids may not be the best choice for pain management due to: Side effects like nausea, constipation, and sleepiness. The risk of addiction (opioid use disorder). The longer you take opioids, the greater your risk of addiction. Pain that lasts for more than 3 months is called chronic pain. Managing chronic pain usually requires more than one approach and is often provided by a team of health care providers working together (multidisciplinary approach). Pain management may be done at a pain management center or pain clinic. How to manage pain without the use of opioids Use non-opioid medicines Non-opioid medicines for pain may include: Over-the-counter or prescription non-steroidal anti-inflammatory drugs (NSAIDs). These may be the first medicines used for pain. They work well for muscle and bone pain, and they reduce swelling. Acetaminophen . This over-the-counter medicine may work well for milder pain but  not swelling. Antidepressants. These may be used to treat chronic pain. A certain type of antidepressant (tricyclics) is often used. These medicines are given in lower doses for pain than when used for depression. Anticonvulsants. These are usually used to treat seizures but may also reduce nerve (neuropathic) pain. Muscle relaxants. These relieve pain caused by sudden muscle tightening (spasms). You may also use a pain medicine that is applied to the skin as a patch, cream, or gel (topical analgesic), such as a numbing medicine. These may cause fewer side effects than medicines taken by mouth. Do certain therapies as directed Some therapies can help with pain management. They include: Physical therapy. You will do exercises to gain strength and flexibility. A physical therapist may teach you exercises to move and stretch parts of your body that are weak, stiff, or painful. You can learn these exercises at physical therapy visits and practice them at home. Physical therapy may also involve: Massage. Heat wraps or applying heat or cold to affected areas. Electrical signals that interrupt pain signals (transcutaneous electrical nerve stimulation, TENS). Weak lasers that reduce pain and swelling (low-level laser therapy). Signals from your body that help you learn to regulate pain (biofeedback). Occupational therapy. This helps you to learn ways to function at home and work with less pain. Recreational therapy. This involves trying new activities or hobbies, such as a physical activity or drawing. Mental health therapy, including: Cognitive behavioral therapy (CBT). This helps you learn coping skills for dealing with pain. Acceptance and commitment therapy (ACT) to change the way you think and react to pain. Relaxation therapies, including muscle relaxation exercises and mindfulness-based stress reduction. Pain  management counseling. This may be individual, family, or group counseling.  Receive  medical treatments Medical treatments for pain management include: Nerve block injections. These may include a pain blocker and anti-inflammatory medicines. You may have injections: Near the spine to relieve chronic back or neck pain. Into joints to relieve back or joint pain. Into nerve areas that supply a painful area to relieve body pain. Into muscles (trigger point injections) to relieve some painful muscle conditions. A medical device placed near your spine to help block pain signals and relieve nerve pain or chronic back pain (spinal cord stimulation device). Acupuncture. Follow these instructions at home Medicines Take over-the-counter and prescription medicines only as told by your health care provider. If you are taking pain medicine, ask your health care providers about possible side effects to watch out for. Do not drive or use heavy machinery while taking prescription opioid pain medicine. Lifestyle  Do not use drugs or alcohol to reduce pain. If you drink alcohol, limit how much you have to: 0-1 drink a day for women who are not pregnant. 0-2 drinks a day for men. Know how much alcohol is in a drink. In the [place] one drink equals one 12 oz bottle of beer (355 mL), one 5 oz glass of wine (148 mL), or one 1 oz glass of hard liquor (44 mL). Do not use any products that contain nicotine or tobacco. These products include cigarettes, chewing tobacco, and vaping devices, such as e-cigarettes. If you need help quitting, ask your health care provider. Eat a healthy diet and maintain a healthy weight. Poor diet and excess weight may make pain worse. Eat foods that are high in fiber. These include fresh fruits and vegetables, whole grains, and beans. Limit foods that are high in fat and processed sugars, such as fried and sweet foods. Exercise regularly. Exercise lowers stress and may help relieve pain. Ask your health care provider what activities and exercises are safe for you. If  your health care provider approves, join an exercise class that combines movement and stress reduction. Examples include yoga and tai chi. Get enough sleep. Lack of sleep may make pain worse. Lower stress as much as possible. Practice stress reduction techniques as told by your therapist. General instructions Work with all your pain management providers to find the treatments that work best for you. You are an important member of your pain management team. There are many things you can do to reduce pain on your own. Consider joining an online or in-person support group for people who have chronic pain. Keep all follow-up visits. This is important. Where to find more information You can find more information about managing pain without opioids from: American Academy of Pain Medicine: painmed.org Institute for Chronic Pain: instituteforchronicpain.org American Chronic Pain Association: theacpa.org Contact a health care provider if: You have side effects from pain medicine. Your pain gets worse or does not get better with treatments or home therapy. You are struggling with anxiety or depression. Summary Many types of pain can be managed without opioids. Chronic pain may respond better to pain management without opioids. Pain is best managed when you and a team of health care providers work together. Pain management without opioids may include non-opioid medicines, medical treatments, physical therapy, mental health therapy, and lifestyle changes. Tell your health care providers if your pain gets worse or is not being managed well enough. This information is not intended to replace advice given to you by your health care provider. Make  sure you discuss any questions you have with your health care provider. Document Revised: 04/15/2020 Document Reviewed: 04/15/2020 Elsevier Patient Education  2024 Elsevier Inc. Make sure to wear two-piece clothing.  No lotions, powders, or deodorants the day of  the appointment. Make sure to bring picture ID and insurance card.  Bring list of medications you are currently taking including any supplements.    Referrals If a referral was made during today's visit and you haven't received any updates within two weeks, please contact the referred provider directly to check on the status.  Recommended Screenings:  Health Maintenance  Topic Date Due   Breast Cancer Screening  12/27/2023   Zoster (Shingles) Vaccine (1 of 2) 01/04/2024*   Medicare Annual Wellness Visit  11/30/2024   Colon Cancer Screening  09/15/2027   DTaP/Tdap/Td vaccine (3 - Td or Tdap) 11/27/2031   Pneumococcal Vaccine for age over 82  Completed   Flu Shot  Completed   DEXA scan (bone density measurement)  Completed   Hepatitis C Screening  Completed   Meningitis B Vaccine  Aged Out   Hepatitis B Vaccine  Discontinued   COVID-19 Vaccine  Discontinued  *Topic was postponed. The date shown is not the original due date.       12/01/2023   11:37 AM  Advanced Directives  Does Patient Have a Medical Advance Directive? Yes  Type of Estate Agent of [place];Living will  Does patient want to make changes to medical advance directive? No - Patient declined  Copy of Healthcare Power of Attorney in Chart? No - copy requested    Vision: Annual vision screenings are recommended for early detection of glaucoma, cataracts, and diabetic retinopathy. These exams can also reveal signs of chronic conditions such as diabetes and high blood pressure.  Dental: Annual dental screenings help detect early signs of oral cancer, gum disease, and other conditions linked to overall health, including heart disease and diabetes.  Please see the attached documents for additional preventive care recommendations.

## 2023-12-01 NOTE — Progress Notes (Signed)
 Chief Complaint  Patient presents with   Medicare Wellness     Subjective:   Morgan Hurst is a 78 y.o. female who presents for a Medicare Annual Wellness Visit.  Allergies (verified) Patient has no known allergies.   History: Past Medical History:  Diagnosis Date   Arthritis    lower back, right hip   B12 deficiency    Back pain    Borderline diabetes    PCP STARTED PT ON METFORMIN  IN 2016 DUE TO ELEVATED GLUCOSE    Complication of anesthesia    FIGHTING WHEN WAKING UP FROM ANESTHESIA   Constipation    Diabetes mellitus without complication (HCC)    Endometriosis    Fatty liver    Gallbladder problem    GERD (gastroesophageal reflux disease)    Heart murmur    High cholesterol    Hip pain    Joint pain    Kidney stones    Leg weakness    Orthodontics    top front 3 teeth caps and bridge   Prediabetes    RA (rheumatoid arthritis) (HCC)    SOB (shortness of breath)    Stomach ulcer    Swelling    Vitamin D  deficiency    Past Surgical History:  Procedure Laterality Date   ABDOMINAL HYSTERECTOMY  age 21   ANKLE SURGERY     CHOLECYSTECTOMY N/A 04/10/2015   Procedure: LAPAROSCOPIC CHOLECYSTECTOMY WITH INTRAOPERATIVE CHOLANGIOGRAM;  Surgeon: Reyes LELON Cota, MD;  Location: [facility] ORS;  Service: General;  Laterality: N/A;   COLONOSCOPY WITH PROPOFOL  N/A 07/10/2017   Procedure: COLONOSCOPY WITH PROPOFOL ;  Surgeon: Therisa Bi, MD;  Location: [facility] ENDOSCOPY;  Service: Gastroenterology;  Laterality: N/A;   ESOPHAGOGASTRODUODENOSCOPY N/A 05/12/2015   Procedure: ESOPHAGOGASTRODUODENOSCOPY (EGD);  Surgeon: Deward CINDERELLA Piedmont, MD;  Location: [facility];  Service: Gastroenterology;  Laterality: N/A;   VAGINAL DELIVERY  2   Family History  Problem Relation Age of Onset   Diabetes Mother    Cancer Mother        colon and breast   Breast cancer Mother    Liver disease Mother    Cancer Father        Leukemia   Cancer Brother        colon and lung   Colon cancer  Brother    Cholelithiasis Daughter    Heart disease Paternal Grandfather    Cancer Other 22       colon   Thyroid  cancer Neg Hx    Social History   Occupational History   Occupation: Retired  Tobacco Use   Smoking status: Never   Smokeless tobacco: Never  Vaping Use   Vaping status: Never Used  Substance and Sexual Activity   Alcohol use: No   Drug use: No   Sexual activity: Not Currently   Tobacco Counseling Counseling given: Not Answered  SDOH Screenings   Food Insecurity: No Food Insecurity (12/01/2023)  Housing: Low Risk  (12/01/2023)  Transportation Needs: No Transportation Needs (12/01/2023)  Utilities: Not At Risk (12/01/2023)  Alcohol Screen: Low Risk  (12/01/2023)  Depression (PHQ2-9): Low Risk  (12/01/2023)  Financial Resource Strain: Low Risk  (12/01/2023)  Physical Activity: Inactive (12/01/2023)  Social Connections: Moderately Isolated (12/01/2023)  Stress: No Stress Concern Present (12/01/2023)  Tobacco Use: Low Risk  (12/01/2023)  Health Literacy: Adequate Health Literacy (12/01/2023)   See flowsheets for full screening details  Depression Screen PHQ 2 & 9 Depression Scale- Over the past 2 weeks, how  often have you been bothered by any of the following problems? Little interest or pleasure in doing things: 0 Feeling down, depressed, or hopeless (PHQ Adolescent also includes...irritable): 0 PHQ-2 Total Score: 0 Trouble falling or staying asleep, or sleeping too much: 0 Feeling tired or having little energy: 0 Poor appetite or overeating (PHQ Adolescent also includes...weight loss): 0 Feeling bad about yourself - or that you are a failure or have let yourself or your family down: 0 Trouble concentrating on things, such as reading the newspaper or watching television (PHQ Adolescent also includes...like school work): 0 Moving or speaking so slowly that other people could have noticed. Or the opposite - being so fidgety or restless that you have been  moving around a lot more than usual: 0 Thoughts that you would be better off dead, or of hurting yourself in some way: 0 PHQ-9 Total Score: 0 If you checked off any problems, how difficult have these problems made it for you to do your work, take care of things at home, or get along with other people?: Not difficult at all     Goals Addressed             This Visit's Progress    Patient Stated       Wants to get arthritis under control       Visit info / Clinical Intake: Medicare Wellness Visit Type:: Subsequent Annual Wellness Visit Persons participating in visit:: patient Medicare Wellness Visit Mode:: Telephone If telephone:: video declined Because this visit was a virtual/telehealth visit:: pt reported vitals If Telephone or Video please confirm:: I connected with the patient using audio enabled telemedicine application and verified that I am speaking with the correct person using two identifiers; I discussed the limitations of evaluation and management by telemedicine; The patient expressed understanding and agreed to proceed Patient Location:: Home Provider Location:: Office/Home Information given by:: patient Interpreter Needed?: No Pre-visit prep was completed: yes AWV questionnaire completed by patient prior to visit?: no Living arrangements:: (!) lives alone Patient's Overall Health Status Rating: good Typical amount of pain: some Does pain affect daily life?: no Are you currently prescribed opioids?: (!) yes  Dietary Habits and Nutritional Risks How many meals a day?: 2 Eats fruit and vegetables daily?: yes Most meals are obtained by: preparing own meals In the last 2 weeks, have you had any of the following?: none Diabetic:: no  Functional Status Activities of Daily Living (to include ambulation/medication): Independent Ambulation: Independent Medication Administration: Independent Home Management: Independent Manage your own finances?: yes Primary  transportation is: driving Concerns about vision?: no *vision screening is required for WTM* Concerns about hearing?: no  Fall Screening Falls in the past year?: 0 Number of falls in past year: 0 Was there an injury with Fall?: 0 Fall Risk Category Calculator: 0 Patient Fall Risk Level: Low Fall Risk  Fall Risk Patient at Risk for Falls Due to: No Fall Risks Fall risk Follow up: Falls evaluation completed; Falls prevention discussed  Home and Transportation Safety: All rugs have non-skid backing?: yes All stairs or steps have railings?: (!) no Grab bars in the bathtub or shower?: (!) no Have non-skid surface in bathtub or shower?: yes Good home lighting?: yes Regular seat belt use?: yes  Cognitive Assessment Difficulty concentrating, remembering, or making decisions? : no Will 6CIT or Mini Cog be Completed: yes What year is it?: 0 points What month is it?: 0 points Give patient an address phrase to remember (5 components): [address] [place] About what time is it?: 0 points Count backwards from 20 to 1: 0 points Say the months of the year in reverse: 0 points Repeat the address phrase from earlier: 0 points 6 CIT Score: 0 points  Advance Directives (For Healthcare) Does Patient Have a Medical Advance Directive?: Yes Does patient want to make changes to medical advance directive?: No - Patient declined Type of Advance Directive: Healthcare Power of [place]; Living will Copy of Healthcare Power of Attorney in Chart?: No - copy requested Copy of Living Will in Chart?: No - copy requested  Reviewed/Updated  Reviewed/Updated: Reviewed All (Medical, Surgical, Family, Medications, Allergies, Care Teams, Patient Goals)        Objective:    Today's Vitals   12/01/23 1130  Weight: 180 lb (81.6 kg)  Height: 5' 1.5 (1.562 m)   Body mass index is 33.46 kg/m.  Current Medications (verified) Outpatient Encounter Medications as of 12/01/2023  Medication Sig    acetaminophen  (TYLENOL ) 650 MG CR tablet Take 650 mg by mouth every 8 (eight) hours as needed for pain.   ascorbic acid (VITAMIN C) 500 MG tablet Take 500 mg by mouth daily.   cholecalciferol  (VITAMIN D3) 25 MCG (1000 UNIT) tablet Take 2,000 Units by mouth daily.   cyanocobalamin  (DODEX ) 1000 MCG/ML injection INJECT 1ML ONCE PER MONTH   esomeprazole (NEXIUM) 40 MG capsule Take 40 mg by mouth daily as needed.   Evolocumab  (REPATHA  SURECLICK) 140 MG/ML SOAJ Inject 140 mg into the skin every 14 (fourteen) days. Substitution permitted; latex free.   hydrocortisone  2.5 % cream APPLY TWICE DAILY FOR ONE WEEK TO THE AFFECTED AREA AND THEN ONCE DAIL FOR 1 WEEK, THEN EVERY OTHER DAY FOR 1 WEEK   magnesium 30 MG tablet Take 30 mg by mouth daily. (Patient taking differently: Take 30 mg by mouth daily as needed.)   metolazone  (ZAROXOLYN ) 5 MG tablet TAKE 1 TABLET BY MOUTH DAILY AS NEEDED   ondansetron  (ZOFRAN -ODT) 4 MG disintegrating tablet Take 1 tablet (4 mg total) by mouth every 8 (eight) hours as needed.   potassium chloride  (KLOR-CON  M) 10 MEQ tablet Take 1 tablet by mouth twice daily. **Take with Metolazone  or Torsemide .**   rOPINIRole  (REQUIP ) 0.25 MG tablet TAKE 1 TABLET(0.25 MG) BY MOUTH AT BEDTIME AS NEEDED   Semaglutide , 1 MG/DOSE, 4 MG/3ML SOPN Inject 1 mg as directed once a week.   torsemide  (DEMADEX ) 20 MG tablet Take 1 tablet (20 mg total) by mouth daily as needed.   traMADol  (ULTRAM ) 50 MG tablet TAKE 1 TABLET(50 MG) BY MOUTH DAILY AS NEEDED   Vitamin E 400 units TABS Take 400 Units by mouth daily.   clotrimazole  (LOTRIMIN ) 1 % cream Apply 1 Application topically 2 (two) times daily. (Patient not taking: Reported on 12/01/2023)   diclofenac  (VOLTAREN ) 75 MG EC tablet Take 1 tablet (75 mg total) by mouth 2 (two) times daily. (Patient not taking: Reported on 12/01/2023)   Facility-Administered Encounter Medications as of 12/01/2023  Medication   ipratropium-albuterol  (DUONEB) 0.5-2.5 (3)  MG/3ML nebulizer solution 3 mL   Hearing/Vision screen Hearing Screening - Comments:: No issues Vision Screening - Comments:: Glasses, [facility], up to date Immunizations and Health Maintenance Health Maintenance  Topic Date Due   Mammogram  12/27/2023   Zoster Vaccines- Shingrix (1 of 2) 01/04/2024 (Originally 06/05/1964)   Medicare Annual Wellness (AWV)  11/30/2024   Colonoscopy  09/15/2027   DTaP/Tdap/Td (3 - Td or Tdap) 11/27/2031   Pneumococcal  Vaccine: 50+ Years  Completed   Influenza Vaccine  Completed   DEXA SCAN  Completed   Hepatitis C Screening  Completed   Meningococcal B Vaccine  Aged Out   Hepatitis B Vaccines 19-59 Average Risk  Discontinued   COVID-19 Vaccine  Discontinued        Assessment/Plan:  This is a routine wellness examination for Shabree.  Patient Care Team: [name], FNP as PCP - General (Family Medicine) Vannie Delon LABOR, MD (Internal Medicine) Dessa Reyes ORN, MD (General Surgery)  I have personally reviewed and noted the following in the patient's chart:   Medical and social history Use of alcohol, tobacco or illicit drugs  Current medications and supplements including opioid prescriptions. Functional ability and status Nutritional status Physical activity Advanced directives List of other physicians Hospitalizations, surgeries, and ER visits in previous 12 months Vitals Screenings to include cognitive, depression, and falls Referrals and appointments  Orders Placed This Encounter  Procedures   MM 3D SCREENING MAMMOGRAM BILATERAL BREAST    Standing Status:   Future    Expiration Date:   11/30/2024    Reason for Exam (SYMPTOM  OR DIAGNOSIS REQUIRED):   need for cancer screening    Preferred imaging location?:   [facility]   In addition, I have reviewed and discussed with patient certain preventive protocols, quality metrics, and best practice recommendations. A written personalized care plan for  preventive services as well as general preventive health recommendations were provided to patient.   Angeline Fredericks, LPN   [id number]/7974   Return in 1 year (on 11/30/2024).  After Visit Summary: (MyChart) Due to this being a telephonic visit, the after visit summary with patients personalized plan was offered to patient via MyChart   Nurse Notes: Order placed for mammogram.

## 2023-12-22 ENCOUNTER — Ambulatory Visit
Admission: RE | Admit: 2023-12-22 | Discharge: 2023-12-22 | Disposition: A | Source: Ambulatory Visit | Attending: Family Medicine | Admitting: Family Medicine

## 2023-12-22 ENCOUNTER — Ambulatory Visit: Payer: Self-pay

## 2023-12-22 ENCOUNTER — Ambulatory Visit
Admission: RE | Admit: 2023-12-22 | Discharge: 2023-12-22 | Disposition: A | Source: Home / Self Care | Attending: Family Medicine | Admitting: Family Medicine

## 2023-12-22 ENCOUNTER — Encounter: Payer: Self-pay | Admitting: Family Medicine

## 2023-12-22 ENCOUNTER — Ambulatory Visit: Admitting: Family Medicine

## 2023-12-22 VITALS — BP 124/76 | HR 78 | Resp 16 | Ht 61.0 in | Wt 188.0 lb

## 2023-12-22 DIAGNOSIS — R2231 Localized swelling, mass and lump, right upper limb: Secondary | ICD-10-CM

## 2023-12-22 DIAGNOSIS — M25531 Pain in right wrist: Secondary | ICD-10-CM

## 2023-12-22 DIAGNOSIS — M79641 Pain in right hand: Secondary | ICD-10-CM

## 2023-12-22 MED ORDER — DICLOFENAC SODIUM 1 % EX GEL: 2.0000 g | Freq: Four times a day (QID) | CUTANEOUS | 0 refills | Status: AC

## 2023-12-22 NOTE — Telephone Encounter (Signed)
 This RN attempted x1 to contact patient, no answer, left vm. Will place in call back folder

## 2023-12-22 NOTE — Telephone Encounter (Signed)
 FYI Only or Action Required?: FYI only for provider: appointment scheduled on 12/22/23.  Patient was last seen in primary care on 10/05/2023 by [name], FNP.  Called Nurse Triage reporting Hand Pain.  Symptoms began yesterday.  Interventions attempted: Nothing.  Symptoms are: unchanged.  Triage Disposition: See Physician Within 24 Hours  Patient/caregiver understands and will follow disposition?: Yes   Received call back from patent. Patient reports: Yesterday started with soreness then later become painful move, swollen, hurts to move, hanging hand down does not hurt,  but pick up hurts; arm and elbow soreness; feels like gout.  Right arm; denies redness, numbness, weakness Warm to touch, wrist, fingers and hand swollen  Denies n/v fever chillls, diff breathing, chest pain Reason for Disposition  [1] Localized rash is very painful AND [2] no fever  Protocols used: Hand Pain-A-AH

## 2023-12-22 NOTE — Progress Notes (Signed)
 Acute Office Visit  Subjective:     Patient ID: Morgan Hurst, female    DOB: 1945/04/19, 78 y.o.   MRN: [id number]  Chief Complaint  Patient presents with   Hand Pain    Swelling in R hand, started last night. Heat/Ice didn't help.    HPI Patient is in today for concerns of right hand pain and right hand swelling. She is a new patient to myself, from an outside [facility] office. She voices yesterday afternoon she sat down in her recliner to take a nap and her right hand was sore at this time but when she woke up one hour later her pain was significantly worse. Denies falls or other injuries prior to onset of symptoms. She voices associated right wrist pain. She states the pain radiates to the right forearm. She voices last night she tried heat, ice, OTC tylenol  arthritis, and Aspercreme without relief of symptoms.   Right forearm non tender to palpation. Right hand and right wrist joints tender to palpation. Localized swelling involving the right hand. Increased warmth involving the dorsal surface of the right hand compared to the left hand. Localized erythema of the right hand. No signs of insect bites, skin abrasions or other wounds. Active ROM significantly limited of the right hand and right wrist.    Will note during exam I did have Dr. Bernardo also evaluate the patient.  Will also note previously on Diclofenac  BID PRN however she reports this was discontinued due to eye issues.   Review of Systems  Respiratory:  Negative for shortness of breath.   Cardiovascular:  Negative for chest pain.  Musculoskeletal:  Positive for joint pain. Negative for falls.        Objective:    BP 124/76   Pulse 78   Resp 16   Ht 5' 1 (1.549 m)   Wt 188 lb (85.3 kg)   SpO2 97%   BMI 35.52 kg/m    Physical Exam Constitutional:      Appearance: Normal appearance.  Cardiovascular:     Rate and Rhythm: Normal rate and regular rhythm.     Heart sounds: Normal heart sounds.  Pulmonary:      Effort: Pulmonary effort is normal. No respiratory distress.     Breath sounds: Normal breath sounds.  Musculoskeletal:     Right wrist: Tenderness and bony tenderness present. Decreased range of motion. Normal pulse.     Right hand: Swelling, tenderness and bony tenderness present. Decreased range of motion. Decreased strength. Normal pulse.  Skin:    General: Skin is warm and dry.  Neurological:     Mental Status: She is alert.  Psychiatric:        Mood and Affect: Mood normal.        Behavior: Behavior normal.        Assessment & Plan:   1. Right hand pain (Primary) Right hand pain acute onset starting yesterday without falls, injuries or other traumas. Right hand pain with limited active ROM.  -STAT right hand x-ray ordered for evaluation of right hand pain.  -STAT right upper extremity ultrasound ordered to rule out DVT due to pain, swelling and erythema without obvious cause .  -Voltaren  gel script provided to be used PRN pain  -Labs ordered including CBC with diff and uric acid but unfortunately not able to be completed this afternoon due to imaging department closing and needing to go immediately to imaging department for STAT imaging. Requested to return Monday for  labs.   - DG Hand Complete Right; Future - US  Venous Img Upper Uni Right(DVT); Future - CBC w/Diff/Platelet - Uric acid - diclofenac  Sodium (VOLTAREN  ARTHRITIS PAIN) 1 % GEL; Apply 2 g topically 4 (four) times daily.  Dispense: 720 g; Refill: 0  2. Right wrist pain - DG Wrist Complete Right; Future - US  Venous Img Upper Uni Right(DVT); Future - CBC w/Diff/Platelet - Uric acid - diclofenac  Sodium (VOLTAREN  ARTHRITIS PAIN) 1 % GEL; Apply 2 g topically 4 (four) times daily.  Dispense: 720 g; Refill: 0  3. Localized swelling on right hand - DG Hand Complete Right; Future - US  Venous Img Upper Uni Right(DVT); Future   Return if symptoms worsen or fail to improve. Scheduled PCP follow up on  01/04/24.  LAYMON LOISE CORE, FNP

## 2023-12-22 NOTE — Telephone Encounter (Signed)
 Copied from CRM #[id number]. Topic: Clinical - Red Word Triage >> Dec 22, 2023  8:31 AM Franky GRADE wrote: Red Word that prompted transfer to Nurse Triage: Patient's daughter Jodie is calling because patient is experiencing severe pain in her had to the point where she threw up. Her hand has begun to swell up and hot to the touch. Answer Assessment - Initial Assessment Questions Patient's daughter reports: Patient currently not with daughter.   Nurse unable to fully triage. Pt's daughter reports will call back.  1. ONSET: When did the pain start?     yesterday 2. LOCATION: Where is the pain located?     Hand hurts so bad feel like could throw up, hand swollen and fingers reports not blue, but warm to touch 3. PAIN: How bad is the pain? (Scale 1-10; or mild, moderate, severe)     severe 4. WORK OR EXERCISE: Has there been any recent work or exercise that involved this part (i.e., hand or wrist) of the body?     no 5. CAUSE: What do you think is causing the pain?     Reports patient so it just started hurting, no injuires or insect bites 6. AGGRAVATING FACTORS: What makes the pain worse? (e.g., using computer)     Hurts to touch, bending wrist hurts 7. OTHER SYMPTOMS: Do you have any other symptoms? (e.g., fever, neck pain, numbness or tingling, rash, swelling)     Swelling, no weakness, numbness, rash, or red spots  Protocols used: Hand Pain-A-AH

## 2023-12-22 NOTE — Telephone Encounter (Signed)
 Nurse called daughter, informed that patient not answering and need more information. Patient daughter reports will go to her house and call back.   Nurse will place patient in call back folder.

## 2023-12-22 NOTE — Telephone Encounter (Signed)
 Received call from patient's daughter reporting patient having severe hand pain, hot to touch, unsure of which hand and reports patient wanted to vomit.  Nurse will attempt to contact patient.

## 2023-12-25 ENCOUNTER — Ambulatory Visit: Payer: Self-pay | Admitting: Family Medicine

## 2023-12-25 ENCOUNTER — Ambulatory Visit (INDEPENDENT_AMBULATORY_CARE_PROVIDER_SITE_OTHER): Admitting: Podiatry

## 2023-12-25 DIAGNOSIS — Z91198 Patient's noncompliance with other medical treatment and regimen for other reason: Secondary | ICD-10-CM

## 2023-12-25 NOTE — Progress Notes (Signed)
 1. Failure to attend appointment with reason given    Patient canceled due to inclement weather.

## 2023-12-26 NOTE — Progress Notes (Signed)
 Morgan Hurst                                          MRN: [id number]   12/26/2023   The [facility] Quality Team Specialist reviewed this patient medical record for the purposes of chart review for care gap closure. The following were reviewed: chart review for care gap closure-kidney health evaluation for diabetes:eGFR  and uACR.    [facility] Quality Team

## 2023-12-28 ENCOUNTER — Encounter

## 2024-01-04 ENCOUNTER — Encounter: Payer: Self-pay | Admitting: Family

## 2024-01-04 ENCOUNTER — Ambulatory Visit: Payer: Self-pay | Admitting: Family

## 2024-01-04 ENCOUNTER — Ambulatory Visit (INDEPENDENT_AMBULATORY_CARE_PROVIDER_SITE_OTHER): Admitting: Family

## 2024-01-04 VITALS — BP 126/80 | HR 76 | Temp 97.6°F | Ht 61.5 in | Wt 186.4 lb

## 2024-01-04 DIAGNOSIS — R7303 Prediabetes: Secondary | ICD-10-CM

## 2024-01-04 DIAGNOSIS — E785 Hyperlipidemia, unspecified: Secondary | ICD-10-CM

## 2024-01-04 DIAGNOSIS — Z136 Encounter for screening for cardiovascular disorders: Secondary | ICD-10-CM | POA: Diagnosis not present

## 2024-01-04 DIAGNOSIS — Z13220 Encounter for screening for lipoid disorders: Secondary | ICD-10-CM | POA: Diagnosis not present

## 2024-01-04 DIAGNOSIS — Z780 Asymptomatic menopausal state: Secondary | ICD-10-CM | POA: Diagnosis not present

## 2024-01-04 DIAGNOSIS — M79641 Pain in right hand: Secondary | ICD-10-CM | POA: Diagnosis not present

## 2024-01-04 LAB — CBC WITH DIFFERENTIAL/PLATELET
Basophils Absolute: 0 K/uL (ref 0.0–0.1)
Basophils Relative: 0.5 % (ref 0.0–3.0)
Eosinophils Absolute: 0.2 K/uL (ref 0.0–0.7)
Eosinophils Relative: 2 % (ref 0.0–5.0)
HCT: 40.9 % (ref 36.0–46.0)
Hemoglobin: 13.5 g/dL (ref 12.0–15.0)
Lymphocytes Relative: 23.3 % (ref 12.0–46.0)
Lymphs Abs: 1.8 K/uL (ref 0.7–4.0)
MCHC: 33.1 g/dL (ref 30.0–36.0)
MCV: 88.1 fl (ref 78.0–100.0)
Monocytes Absolute: 0.7 K/uL (ref 0.1–1.0)
Monocytes Relative: 8.6 % (ref 3.0–12.0)
Neutro Abs: 5.2 K/uL (ref 1.4–7.7)
Neutrophils Relative %: 65.6 % (ref 43.0–77.0)
Platelets: 186 K/uL (ref 150.0–400.0)
RBC: 4.64 Mil/uL (ref 3.87–5.11)
RDW: 14.2 % (ref 11.5–15.5)
WBC: 7.9 K/uL (ref 4.0–10.5)

## 2024-01-04 LAB — LIPID PANEL
Cholesterol: 146 mg/dL (ref 28–200)
HDL: 50.2 mg/dL (ref >39.00)
LDL Cholesterol: 66 mg/dL (ref 10–99)
NonHDL: 95.74
Total CHOL/HDL Ratio: 3
Triglycerides: 147 mg/dL (ref 10.0–149.0)
VLDL: 29.4 mg/dL (ref 0.0–40.0)

## 2024-01-04 LAB — TSH: TSH: 1.43 u[IU]/mL (ref 0.35–5.50)

## 2024-01-04 LAB — HEMOGLOBIN A1C: Hgb A1c MFr Bld: 5.5 % (ref 4.6–6.5)

## 2024-01-04 LAB — URIC ACID: Uric Acid, Serum: 6.3 mg/dL (ref 2.4–7.0)

## 2024-01-04 MED ORDER — BLOOD GLUCOSE MONITORING SUPPL DEVI: 1.0000 | 0 refills | Status: AC

## 2024-01-04 MED ORDER — DICLOFENAC SODIUM 75 MG PO TBEC: 75.0000 mg | DELAYED_RELEASE_TABLET | Freq: Two times a day (BID) | ORAL | 2 refills | Status: AC

## 2024-01-04 MED ORDER — BLOOD GLUCOSE TEST VI STRP: 1.0000 | ORAL_STRIP | 0 refills | Status: AC

## 2024-01-04 MED ORDER — METFORMIN HCL ER 500 MG PO TB24: 500.0000 mg | ORAL_TABLET | Freq: Every evening | ORAL | 2 refills | Status: DC

## 2024-01-04 MED ORDER — LANCETS MISC: 1.0000 | 0 refills | Status: AC

## 2024-01-04 MED ORDER — LANCET DEVICE MISC: 1.0000 | 0 refills | Status: AC

## 2024-01-04 NOTE — Patient Instructions (Addendum)
 I would use diclofenac  oral for 7 days and then stop medication and start TOPICAL diclofenac .   I would favor keeping your appointment with Dr Claudene, sports medicine. If he feels you need an orthopedic hand specialist, I would refer you to Dr Francisco at [facility]; please let me know.   Please call  and schedule your  bone density scan as we discussed.   [facility]  ( new location in 2023)  [address], [place], [place] [address]  [place], [place]  [phone]   Stop ozempic   Instead we will use meformin for weight management and blood sugar control  Goal of fasting blood sugar is between 70-120. If in this range, we are reaching our target a1c ( goal 6.5%)   Please check fasting blood sugar in the morning time once or twice a week.  You may also check if you feel like you are having a low episode or particularly high episode of blood sugar.  If blood sugars increase, I may advise you to check blood sugar after your largest meal.  You specifically do this TWO hours after largest meal with the goal of being less than 180.  If blood sugar is checked sooner than 2 hours after largest meal, and it will be  expected to be elevated. You must wait 2 hours.   If your blood sugar is less than 180 hours after your largest meal, again we are reaching our target a1c goal   Call [facility] clinic if: BG < 70 or > 250.   If you have any symptoms of low blood sugar ( sweating, shakiness, lightheaded, dizzy) that you notify me. If you have a low, please drink a glass of orange juice and recheck blood sugar every 5 minutes until you dont feel symptomatic AND blood sugar is above 80.

## 2024-01-04 NOTE — Assessment & Plan Note (Signed)
 Presentation consistent with right CMC arthritis.  Reviewed x-rays with patient.  Negative DVT right arm.  Discussed cardiovascular risk, GI bleed with diclofenac  orally.  Advised to limit use and take with food for 7 days for flares.  Tramadol  and Tylenol  has not been particularly helpful.  After 7 days of diclofenac , she may use topical diclofenac .  Upcoming appointment with Dr. Claudene.  Will follow.

## 2024-01-04 NOTE — Progress Notes (Unsigned)
 Assessment & Plan:  Encounter for lipid screening for cardiovascular disease  Asymptomatic postmenopausal state -     DG Bone Density; Future  Hyperlipidemia, unspecified hyperlipidemia type -     Lipid panel -     COMPLETE METABOLIC PANEL WITHOUT GFR  Prediabetes -     Hemoglobin A1c -     TSH -     COMPLETE METABOLIC PANEL WITHOUT GFR -     metFORMIN  HCl ER; Take 1 tablet (500 mg total) by mouth every evening.  Dispense: 90 tablet; Refill: 2 -     Blood Glucose Monitoring Suppl; 1 each by Does not apply route as directed. Dispense based on patient and insurance preference. Use up to four times daily as directed. (FOR ICD-10 E10.9, E11.9).  Dispense: 1 each; Refill: 0 -     Blood Glucose Test; 1 each by Does not apply route as directed. Dispense based on patient and insurance preference. Use up to four times daily as directed. (FOR ICD-10 E10.9, E11.9).  Dispense: 100 strip; Refill: 0 -     Lancet Device; 1 each by Does not apply route as directed. Dispense based on patient and insurance preference. Use up to four times daily as directed. (FOR ICD-10 E10.9, E11.9).  Dispense: 1 each; Refill: 0 -     Lancets; 1 each by Does not apply route as directed. Dispense based on patient and insurance preference. Use up to four times daily as directed. (FOR ICD-10 E10.9, E11.9).  Dispense: 100 each; Refill: 0  Right hand pain Assessment & Plan: Presentation consistent with right CMC arthritis.  Reviewed x-rays with patient.  Negative DVT right arm.  Discussed cardiovascular risk, GI bleed with diclofenac  orally.  Advised to limit use and take with food for 7 days for flares.  Tramadol  and Tylenol  has not been particularly helpful.  After 7 days of diclofenac , she may use topical diclofenac .  Upcoming appointment with Dr. Claudene.  Will follow.   Orders: -     Uric acid -     CBC with Differential/Platelet -     COMPLETE METABOLIC PANEL WITHOUT GFR -     Diclofenac  Sodium; Take 1 tablet (75 mg  total) by mouth 2 (two) times daily for 7 days. Take with food. Limit use.  Dispense: 30 tablet; Refill: 2     Return precautions given.   Risks, benefits, and alternatives of the medications and treatment plan prescribed today were discussed, and patient expressed understanding.   Education regarding symptom management and diagnosis given to patient on AVS either electronically or printed.  Return in about 6 weeks (around 02/15/2024).  Rollene Northern, FNP  Subjective:    Patient ID: Morgan Hurst, female    DOB: 03/10/1945, 78 y.o.   MRN: [id number]  CC: Morgan Hurst is a 78 y.o. female who presents today for follow up.   HPI: Complains of right hand pain and swelling x Onset was gradual with soreness at first. Worse at base on thumb No fever, wound, erythema. No trauma or injury.    She was seen at [facility]  12/22/23  for right hand swelling She took oral dose diflofenac x 2 with relief.  She takes tramadol  50mg  rarely for pain.   She is taking aspercreme tylenol  1300mg  TID with some relief.   She has not tried voltaren  gel as prescribed by UC.     Mobic  was not as effective. Tramadol  50mg  TID was not very helpful   No h/o  ckd, GIB Right hand x-ray 12/22/2023 Right hand: Frontal, oblique, and lateral views of the right hand are obtained on 3 images. Bones are diffusely osteopenic. No acute fracture, subluxation, or dislocation. There is diffuse joint space narrowing most pronounced throughout the distal interphalangeal joints, consistent with osteoarthritis. No erosive changes. The soft tissues are unremarkable.   Right wrist: Frontal, oblique, lateral, and ulnar deviated views are obtained on 4 images. No fracture, subluxation, or dislocation. Mild osteoarthritis within the radial aspect of the carpus and first carpometacarpal joint. Soft tissues are unremarkable.  No evidence of DVT within the right upper extremity.    Allergies: Patient has no known  allergies. Medications Ordered Prior to Encounter[1]  Review of Systems  Constitutional:  Negative for chills and fever.  Respiratory:  Negative for cough.   Cardiovascular:  Negative for chest pain and palpitations.  Gastrointestinal:  Negative for nausea and vomiting.  Musculoskeletal:  Positive for arthralgias (right hand).      Objective:    BP 126/80   Pulse 76   Temp 97.6 F (36.4 C) (Oral)   Ht 5' 1.5 (1.562 m)   Wt 186 lb 6.4 oz (84.6 kg)   SpO2 99%   BMI 34.65 kg/m  BP Readings from Last 3 Encounters:  01/04/24 126/80  12/22/23 124/76  10/05/23 126/80   Wt Readings from Last 3 Encounters:  01/04/24 186 lb 6.4 oz (84.6 kg)  12/22/23 188 lb (85.3 kg)  12/01/23 180 lb (81.6 kg)    Physical Exam Vitals reviewed.  Constitutional:      Appearance: She is well-developed.  Eyes:     Conjunctiva/sclera: Conjunctivae normal.  Cardiovascular:     Rate and Rhythm: Normal rate and regular rhythm.     Pulses: Normal pulses.     Heart sounds: Normal heart sounds.  Pulmonary:     Effort: Pulmonary effort is normal.     Breath sounds: Normal breath sounds. No wheezing, rhonchi or rales.  Musculoskeletal:     Right hand: Tenderness and bony tenderness present. No swelling or deformity. Normal range of motion. Decreased strength.     Comments: Grip strength slightly decreased in right hand. palpable radial pulses and sensation intact.  No pain or limited ROM with okay sign. Right tenderness of CMC. No increased heat, edema.  No bony step off along ulnar or radial border of wrist.     Skin:    General: Skin is warm and dry.  Neurological:     Mental Status: She is alert.  Psychiatric:        Speech: Speech normal.        Behavior: Behavior normal.        Thought Content: Thought content normal.           [1]  Current Outpatient Medications on File Prior to Visit  Medication Sig Dispense Refill   acetaminophen  (TYLENOL ) 650 MG CR tablet Take 650 mg by mouth  every 8 (eight) hours as needed for pain.     ascorbic acid (VITAMIN C) 500 MG tablet Take 500 mg by mouth daily.     cholecalciferol  (VITAMIN D3) 25 MCG (1000 UNIT) tablet Take 2,000 Units by mouth daily.     cyanocobalamin  (DODEX ) 1000 MCG/ML injection INJECT 1ML ONCE PER MONTH 10 mL 2   esomeprazole (NEXIUM) 40 MG capsule Take 40 mg by mouth daily as needed.     Evolocumab  (REPATHA  SURECLICK) 140 MG/ML SOAJ Inject 140 mg into the skin every 14 (fourteen) days.  Substitution permitted; latex free. 2 mL 2   magnesium 30 MG tablet Take 30 mg by mouth daily.     metolazone  (ZAROXOLYN ) 5 MG tablet TAKE 1 TABLET BY MOUTH DAILY AS NEEDED 90 tablet 1   ondansetron  (ZOFRAN -ODT) 4 MG disintegrating tablet Take 1 tablet (4 mg total) by mouth every 8 (eight) hours as needed. 20 tablet 0   potassium chloride  (KLOR-CON  M) 10 MEQ tablet Take 1 tablet by mouth twice daily. **Take with Metolazone  or Torsemide .** 180 tablet 1   rOPINIRole  (REQUIP ) 0.25 MG tablet TAKE 1 TABLET(0.25 MG) BY MOUTH AT BEDTIME AS NEEDED 90 tablet 3   torsemide  (DEMADEX ) 20 MG tablet Take 1 tablet (20 mg total) by mouth daily as needed. 90 tablet 0   traMADol  (ULTRAM ) 50 MG tablet TAKE 1 TABLET(50 MG) BY MOUTH DAILY AS NEEDED 30 tablet 2   Vitamin E 400 units TABS Take 400 Units by mouth daily.     hydrocortisone  2.5 % cream APPLY TWICE DAILY FOR ONE WEEK TO THE AFFECTED AREA AND THEN ONCE DAIL FOR 1 WEEK, THEN EVERY OTHER DAY FOR 1 WEEK (Patient not taking: Reported on 01/04/2024) 30 g 2   Current Facility-Administered Medications on File Prior to Visit  Medication Dose Route Frequency Provider Last Rate Last Admin   ipratropium-albuterol  (DUONEB) 0.5-2.5 (3) MG/3ML nebulizer solution 3 mL  3 mL Nebulization Once Keyundra Fant G, FNP

## 2024-01-04 NOTE — Assessment & Plan Note (Signed)
 History of prediabetes, not diabetes.  Due to insurance, she can no longer can take semaglutide .  In its place we will start metformin  500 mg daily.  Provided glucometer and counseled patient on how to check fasting blood sugar a couple of days per week.  Close follow-up

## 2024-01-05 LAB — COMPLETE METABOLIC PANEL WITHOUT GFR
AG Ratio: 1.5 (calc) (ref 1.0–2.5)
ALT: 14 U/L (ref 6–29)
AST: 16 U/L (ref 10–35)
Albumin: 4 g/dL (ref 3.6–5.1)
Alkaline phosphatase (APISO): 79 U/L (ref 37–153)
BUN: 12 mg/dL (ref 7–25)
CO2: 29 mmol/L (ref 20–32)
Calcium: 9.2 mg/dL (ref 8.6–10.4)
Chloride: 105 mmol/L (ref 98–110)
Creat: 0.74 mg/dL (ref 0.60–1.00)
Globulin: 2.7 g/dL (ref 1.9–3.7)
Glucose, Bld: 90 mg/dL (ref 65–99)
Potassium: 4.1 mmol/L (ref 3.5–5.3)
Sodium: 142 mmol/L (ref 135–146)
Total Bilirubin: 0.4 mg/dL (ref 0.2–1.2)
Total Protein: 6.7 g/dL (ref 6.1–8.1)

## 2024-01-08 NOTE — Progress Notes (Unsigned)
 " Morgan Hurst [facility] [address] [place] [address] Phone: [phone] Subjective:   Morgan Hurst am a scribe for Dr. Claudene.   I'm seeing this patient by the request  of:  Morgan Rollene MATSU, FNP  CC: Shoulder pain  YEP:Dlagzrupcz  Morgan Hurst is a 78 y.o. female coming in with complaint of R shoulder pain. Last seen in May for R hip pain. Saw Dr. Joane for R shoulder pain in February 01, 2022. Patient states that the shoulder is better. Been having issues with the right hand. The arthritis is giving her issues.        Past Medical History:  Diagnosis Date   Arthritis    lower back, right hip   B12 deficiency    Back pain    Borderline diabetes    PCP STARTED PT ON METFORMIN  IN 02-02-15 DUE TO ELEVATED GLUCOSE    Complication of anesthesia    FIGHTING WHEN WAKING UP FROM ANESTHESIA   Constipation    Diabetes mellitus without complication (HCC)    Endometriosis    Fatty liver    Gallbladder problem    GERD (gastroesophageal reflux disease)    Heart murmur    High cholesterol    Hip pain    Joint pain    Kidney stones    Leg weakness    Orthodontics    top front 3 teeth caps and bridge   Prediabetes    RA (rheumatoid arthritis) (HCC)    SOB (shortness of breath)    Stomach ulcer    Swelling    Vitamin D  deficiency    Past Surgical History:  Procedure Laterality Date   ABDOMINAL HYSTERECTOMY  age 3   ANKLE SURGERY     CHOLECYSTECTOMY N/A 04/10/2015   Procedure: LAPAROSCOPIC CHOLECYSTECTOMY WITH INTRAOPERATIVE CHOLANGIOGRAM;  Surgeon: Morgan LELON Cota, MD;  Location: [facility] ORS;  Service: General;  Laterality: N/A;   COLONOSCOPY WITH PROPOFOL  N/A 07/10/2017   Procedure: COLONOSCOPY WITH PROPOFOL ;  Surgeon: Morgan Bi, MD;  Location: [facility];  Service: Gastroenterology;  Laterality: N/A;   ESOPHAGOGASTRODUODENOSCOPY N/A 05/12/2015   Procedure: ESOPHAGOGASTRODUODENOSCOPY (EGD);  Surgeon: Morgan CINDERELLA Piedmont, MD;  Location: [facility];   Service: Gastroenterology;  Laterality: N/A;   VAGINAL DELIVERY  3   Social History   Socioeconomic History   Marital status: Widowed    Spouse name: Not on file   Number of children: 2   Years of education: Not on file   Highest education level: Not on file  Occupational History   Occupation: Retired  Tobacco Use   Smoking status: Never   Smokeless tobacco: Never  Vaping Use   Vaping status: Never Used  Substance and Sexual Activity   Alcohol use: No   Drug use: No   Sexual activity: Not Currently  Other Topics Concern   Not on file  Social History Narrative   Lives in [place].       Husband passed away 02-02-15 from PNA.      Work - retired from dance movement psychotherapist      Diet - regular; working on [employer]   Exercise - walks occasionally, limited by fatigue. [place] and takes care of her 52 year old grandchild.    Social Drivers of Health   Tobacco Use: Low Risk (01/04/2024)   Patient History    Smoking Tobacco Use: Never    Smokeless Tobacco Use: Never    Passive Exposure: Not on file  Financial  Resource Strain: Low Risk (12/01/2023)   Overall Financial Resource Strain (CARDIA)    Difficulty of Paying Living Expenses: Not hard at all  Food Insecurity: No Food Insecurity (12/01/2023)   Epic    Worried About Programme Researcher, Broadcasting/film/video in the Last Year: Never true    Ran Out of Food in the Last Year: Never true  Transportation Needs: No Transportation Needs (12/01/2023)   Epic    Lack of Transportation (Medical): No    Lack of Transportation (Non-Medical): No  Physical Activity: Inactive (12/01/2023)   Exercise Vital Sign    Days of Exercise per Week: 0 days    Minutes of Exercise per Session: 0 min  Stress: No Stress Concern Present (12/01/2023)   [employer] of Occupational Health - Occupational Stress Questionnaire    Feeling of Stress: Not at all  Social Connections: Moderately Isolated (12/01/2023)   Social Connection and Isolation Panel    Frequency of  Communication with Friends and Family: More than three times a week    Frequency of Social Gatherings with Friends and Family: More than three times a week    Attends Religious Services: More than 4 times per year    Active Member of [employer] or Organizations: No    Attends Banker Meetings: Never    Marital Status: Widowed  Depression (PHQ2-9): Low Risk (01/04/2024)   Depression (PHQ2-9)    PHQ-2 Score: 0  Alcohol Screen: Low Risk (12/01/2023)   Alcohol Screen    Last Alcohol Screening Score (AUDIT): 0  Housing: Low Risk (12/01/2023)   Epic    Unable to Pay for Housing in the Last Year: No    Number of Times Moved in the Last Year: 0    Homeless in the Last Year: No  Utilities: Not At Risk (12/01/2023)   Epic    Threatened with loss of utilities: No  Health Literacy: Adequate Health Literacy (12/01/2023)   B1300 Health Literacy    Frequency of need for help with medical instructions: Never   Allergies[1] Family History  Problem Relation Age of Onset   Diabetes Mother    Cancer Mother        colon and breast   Breast cancer Mother    Liver disease Mother    Cancer Father        Leukemia   Cancer Brother        colon and lung   Colon cancer Brother    Cholelithiasis Daughter    Heart disease Paternal Grandfather    Cancer Other 29       colon   Thyroid  cancer Neg Hx     Current Outpatient Medications (Endocrine & Metabolic):    metFORMIN  (GLUCOPHAGE -XR) 500 MG 24 hr tablet, Take 1 tablet (500 mg total) by mouth every evening.  Current Outpatient Medications (Cardiovascular):    Evolocumab  (REPATHA  SURECLICK) 140 MG/ML SOAJ, Inject 140 mg into the skin every 14 (fourteen) days. Substitution permitted; latex free.   metolazone  (ZAROXOLYN ) 5 MG tablet, TAKE 1 TABLET BY MOUTH DAILY AS NEEDED   torsemide  (DEMADEX ) 20 MG tablet, Take 1 tablet (20 mg total) by mouth daily as needed.  Current Facility-Administered Medications (Respiratory):     ipratropium-albuterol  (DUONEB) 0.5-2.5 (3) MG/3ML nebulizer solution 3 mL  Current Outpatient Medications (Analgesics):    acetaminophen  (TYLENOL ) 650 MG CR tablet, Take 650 mg by mouth every 8 (eight) hours as needed for pain.   traMADol  (ULTRAM ) 50 MG tablet, TAKE 1 TABLET(50 MG) BY  MOUTH DAILY AS NEEDED   diclofenac  (VOLTAREN ) 75 MG EC tablet, Take 1 tablet (75 mg total) by mouth 2 (two) times daily for 7 days. Take with food. Limit use.  Current Outpatient Medications (Hematological):    cyanocobalamin  (DODEX ) 1000 MCG/ML injection, INJECT ONCE PER MONTH  Current Outpatient Medications (Other):    ascorbic acid (VITAMIN C) 500 MG tablet, Take 500 mg by mouth daily.   cholecalciferol  (VITAMIN D3) 25 MCG (1000 UNIT) tablet, Take 2,000 Units by mouth daily.   DULoxetine  (CYMBALTA ) 20 MG capsule, Take 1 capsule (20 mg total) by mouth daily.   esomeprazole (NEXIUM) 40 MG capsule, Take 40 mg by mouth daily as needed.   magnesium 30 MG tablet, Take 30 mg by mouth daily.   ondansetron  (ZOFRAN -ODT) 4 MG disintegrating tablet, Take 1 tablet (4 mg total) by mouth every 8 (eight) hours as needed.   potassium chloride  (KLOR-CON  M) 10 MEQ tablet, Take 1 tablet by mouth twice daily. **Take with Metolazone  or Torsemide .**   rOPINIRole  (REQUIP ) 0.25 MG tablet, TAKE 1 TABLET(0.25 MG) BY MOUTH AT BEDTIME AS NEEDED   Vitamin E 400 units TABS, Take 400 Units by mouth daily.   Blood Glucose Monitoring Suppl DEVI, 1 each by Does not apply route as directed. Dispense based on patient and insurance preference. Use up to four times daily as directed. (FOR ICD-10 E10.9, E11.9).   Glucose Blood (BLOOD GLUCOSE TEST STRIPS) STRP, 1 each by Does not apply route as directed. Dispense based on patient and insurance preference. Use up to four times daily as directed. (FOR ICD-10 E10.9, E11.9).   hydrocortisone  2.5 % cream, APPLY TWICE DAILY FOR ONE WEEK TO THE AFFECTED AREA AND THEN ONCE DAIL FOR 1 WEEK, THEN EVERY  OTHER DAY FOR 1 WEEK (Patient not taking: Reported on 01/04/2024)   Lancet Device MISC, 1 each by Does not apply route as directed. Dispense based on patient and insurance preference. Use up to four times daily as directed. (FOR ICD-10 E10.9, E11.9).   Lancets MISC, 1 each by Does not apply route as directed. Dispense based on patient and insurance preference. Use up to four times daily as directed. (FOR ICD-10 E10.9, E11.9).   Reviewed prior external information including notes and imaging from  primary care provider As well as notes that were available from care everywhere and other healthcare systems.  Past medical history, social, surgical and family history all reviewed in electronic medical record.  No pertanent information unless stated regarding to the chief complaint.   Review of Systems:  No headache, visual changes, nausea, vomiting, diarrhea, constipation, dizziness, abdominal pain, skin rash, fevers, chills, night sweats, weight loss, swollen lymph nodes, body aches, joint swelling, chest pain, shortness of breath, mood changes. POSITIVE muscle aches  Objective  Blood pressure 130/70, pulse 85, height 5' 1.5 (1.562 m), weight 190 lb (86.2 kg), SpO2 97%.   General: No apparent distress alert and oriented x3 mood and affect normal, dressed appropriately.  HEENT: Pupils equal, extraocular movements intact  Respiratory: Patient's speak in full sentences and does not appear short of breath  Cardiovascular: No lower extremity edema, non tender, no erythema  Right shoulder positive impingement noted.  Positive crossover noted. Hand exam shows that patient does have arthritic changes noted.  Significant arthritic changes of the lower back.  Difficulty with going from a seated to standing position.    Impression and Recommendations:     The above documentation has been reviewed and is accurate and complete  Braylynn Lewing M Cathren Sween, DO       [1] No Known Allergies  "

## 2024-01-09 ENCOUNTER — Ambulatory Visit: Admitting: Family Medicine

## 2024-01-09 ENCOUNTER — Encounter: Payer: Self-pay | Admitting: Family Medicine

## 2024-01-09 VITALS — BP 130/70 | HR 85 | Ht 61.5 in | Wt 190.0 lb

## 2024-01-09 DIAGNOSIS — M48062 Spinal stenosis, lumbar region with neurogenic claudication: Secondary | ICD-10-CM | POA: Diagnosis not present

## 2024-01-09 MED ORDER — DULOXETINE HCL 20 MG PO CPEP: 20.0000 mg | ORAL_CAPSULE | Freq: Every day | ORAL | 0 refills | Status: DC

## 2024-01-09 NOTE — Assessment & Plan Note (Signed)
 Patient has significant arthritic changes of multiple joints.  Including patient's hand and wrist at this time.  Starting to become more symptomatic.  We discussed different treatment options.  Different medications.  Patient has had lots of them over the course of time but would like to consider the possibility of Cymbalta .  Patient was prescribed this today.  Hopeful that this will help.  Patient is using diclofenac  but we discussed using it twice a day for 5-day follow-up again 2 months to see how patient is doing.

## 2024-01-09 NOTE — Patient Instructions (Addendum)
 Good to see you. Happy Holidays. Cymbalta  20 mg. Stay active. See me again in 2 months.

## 2024-01-11 ENCOUNTER — Other Ambulatory Visit: Payer: Self-pay | Admitting: Family

## 2024-01-11 DIAGNOSIS — E785 Hyperlipidemia, unspecified: Secondary | ICD-10-CM

## 2024-01-19 ENCOUNTER — Ambulatory Visit
Admission: RE | Admit: 2024-01-19 | Discharge: 2024-01-19 | Disposition: A | Discharge status: Home / Self Care | Source: Ambulatory Visit | Attending: Family | Admitting: Family

## 2024-01-19 DIAGNOSIS — Z780 Asymptomatic menopausal state: Secondary | ICD-10-CM

## 2024-01-19 DIAGNOSIS — Z1231 Encounter for screening mammogram for malignant neoplasm of breast: Secondary | ICD-10-CM | POA: Insufficient documentation

## 2024-01-23 ENCOUNTER — Other Ambulatory Visit: Payer: Self-pay | Admitting: Family

## 2024-01-23 DIAGNOSIS — R928 Other abnormal and inconclusive findings on diagnostic imaging of breast: Secondary | ICD-10-CM

## 2024-01-24 ENCOUNTER — Ambulatory Visit
Admission: RE | Admit: 2024-01-24 | Discharge: 2024-01-24 | Disposition: A | Discharge status: Home / Self Care | Source: Ambulatory Visit | Attending: Family | Admitting: Family

## 2024-01-24 DIAGNOSIS — R928 Other abnormal and inconclusive findings on diagnostic imaging of breast: Secondary | ICD-10-CM | POA: Diagnosis present

## 2024-01-29 ENCOUNTER — Ambulatory Visit: Payer: Self-pay | Admitting: Family

## 2024-02-14 ENCOUNTER — Ambulatory Visit: Payer: Self-pay

## 2024-02-14 ENCOUNTER — Telehealth: Payer: Self-pay

## 2024-02-14 NOTE — Telephone Encounter (Signed)
 Called [facility] and advised Darice of patient's symptoms and ER Refusal

## 2024-02-14 NOTE — Telephone Encounter (Signed)
 I asked patient to please call us  back.  I also sent a message to patient via MyChart.  E2C2 - when patient calls back, please let her know that her appointment with Rollene Northern, FNP, for tomorrow has been changed from 10:30am to 2:30pm, due to a change in Rollene Northern, FNP's schedule.  Please assist patient with rescheduling if this new time does not work with her schedule.

## 2024-02-14 NOTE — Telephone Encounter (Signed)
 Pt is scheduled to see Rollene tomorrow at 2:30. Pt refused to be evaluated at the ED.

## 2024-02-14 NOTE — Telephone Encounter (Signed)
 FYI Only or Action Required?: Action required by provider: request for appointment and ER Refusal and patient didn't want to drive on the roadways due to safety.  Patient was last seen in primary care on 01/04/2024 by Morgan Rollene MATSU, FNP.  Called Nurse Triage reporting Leg Swelling.  Symptoms began 2 weeks ago.  Interventions attempted: Rest, hydration, or home remedies.  Symptoms are: gradually worsening.  Triage Disposition: See HCP Within 4 Hours (Or PCP Triage)  Patient/caregiver understands and will follow disposition?: No, wishes to speak with PCP      Message from [name] sent at 02/14/2024  3:44 PM EST  Reason for Triage: feet and legs swollen, numbness and tingling in fingers and hands.   Reason for Disposition  [1] Thigh, calf, or ankle swelling AND [2] only 1 side  Answer Assessment - Initial Assessment Questions Patient called and advised that she has been having numbness/tingling in her hands and feet She also states that she is having swelling in her feet and legs up to her knees x two weeks She states that her right leg was swollen worse at first but now they are both swollen about the same size She denies pain during triage but states she did have lower leg pain prior to the swelling  Patient was doing a lot of work and had pain in her legs that night---she woke up and had to get up and take a Tramadol . She elevated her legs and she states the swelling went down slightly but nowhere near normal She states both lower legs are very tight She also states that if she is doing too much with her hands they will start to tingle and go numb (right hand worse than the left)---sometimes hurts in the right elbow  Patient saw PCP for right hand pain on 12/21/2024 She saw Dr Claudene for arthritis ---he put patient on a new medication and patient states symptoms got worse Patient went back to using diclofenac   Patient states she was told she had a blood clot behind her  eye last year--but that went away she states that went away Patient   Patient states that she has an appointment tomorrow (02/15/2024) at 2:30pm with her PCP but she states she was called and told that her PCP wouldn't be there tomorrow and that she would need to reschedule  Patient was advised that being seen in the next four hours but patient states that the roads are not safe at this time There are no appointments available today at PCP office or any offices within the surrounding region. Patient is also advised that the ER would have imaging capabilities if needed after provider evaluation  (Patient had an ultrasound a few months ago to assess for possible blood clots another time so she was familiar with this) She did not want to call 911 for an ambulance at this time  She states it has been this way for two weeks and she would just like to see someone at her PCP office tomorrow if possible Patient is advised to call us  back if anything changes or with any further questions/concerns. Patient is advised that if anything worsens to go to the Emergency Room. Patient verbalized understanding.  Protocols used: Leg Swelling and Edema-A-AH

## 2024-02-15 ENCOUNTER — Encounter: Payer: Self-pay | Admitting: Family

## 2024-02-15 ENCOUNTER — Ambulatory Visit: Admitting: Family

## 2024-02-15 VITALS — BP 124/68 | HR 85 | Temp 98.3°F | Ht 61.0 in | Wt 201.6 lb

## 2024-02-15 DIAGNOSIS — M79641 Pain in right hand: Secondary | ICD-10-CM

## 2024-02-15 DIAGNOSIS — R200 Anesthesia of skin: Secondary | ICD-10-CM | POA: Insufficient documentation

## 2024-02-15 DIAGNOSIS — R7303 Prediabetes: Secondary | ICD-10-CM

## 2024-02-15 DIAGNOSIS — L989 Disorder of the skin and subcutaneous tissue, unspecified: Secondary | ICD-10-CM

## 2024-02-15 DIAGNOSIS — M7989 Other specified soft tissue disorders: Secondary | ICD-10-CM

## 2024-02-15 MED ORDER — GABAPENTIN 100 MG PO CAPS: 100.0000 mg | ORAL_CAPSULE | Freq: Every day | ORAL | 3 refills | Status: AC

## 2024-02-15 NOTE — Patient Instructions (Addendum)
 Gabapentin  100 mg at bedtime.  Monitor for sedation.  As discussed, do not take tramadol  with gabapentin  as both are sedating. Will titrate this at follow-up.  I have also placed a referral to Dr. Francisco, [facility] for right hand numbness Referral dermatology for skin lesion.  Please let me know once this is scheduled   ACE wraps ; elevation   Increase metformin   Metformin  is used in prediabetes, diabetes, and also for weight loss by decreasing calorie consumption.   It works in a couple of ways by decreasing liver glucose production, decreases intestinal absorption of glucose and improves insulin  sensitivity (increases peripheral glucose uptake and utilization).     Please make sure that you titrate per below so not to cause any GI upset.    Start metformin  XR with one 500mg  tablet at night. After one week, you may increase to two tablets at night ( total of 1000mg ) . The third week, you may take take two tablets at night and one tablet in the morning.  The fourth week, you may take two tablets in the morning ( 1000mg  total) and two tablets at night (1000mg  total). This will bring you to a maximum daily dose of 2000mg /day which is maximum dose.  So you are aware,  you may take ALL 4 tablets of metformin  together at the same time if preferable and doesn't cause GI upset. You may take metformin  2000mg  ( four of the 500mg  tablets) together in the morning or at night if you prefer.   Along the way, if you want to increase more slowly, please do as this medication can cause GI discomfort and loose stools which usually get better with time , however some patients find that they can only tolerate a certain dose and cannot increase to maximum dose.

## 2024-02-15 NOTE — Progress Notes (Signed)
 "  Assessment & Plan:  Leg swelling Assessment & Plan: Presentation consistent with venous insufficiency. Pending bilateral venous ultrasound, echocardiogram, BNP. Counseled on use of compression stockings and Ace wrap given today.  Counseled on importance of elevation. Close follow up.   Orders: -     Brain natriuretic peptide -     DG Chest 2 View; Future -     ECHOCARDIOGRAM COMPLETE; Future -     Comprehensive metabolic panel with GFR -     US  Venous Img Lower Bilateral (DVT); Future  Numbness and tingling in right hand -     B12 and Folate Panel -     Ambulatory referral to Orthopedic Surgery -     Gabapentin ; Take 1 capsule (100 mg total) by mouth at bedtime.  Dispense: 30 capsule; Refill: 3  Skin lesion -     Ambulatory referral to Dermatology  Right hand pain Assessment & Plan: Presentation concerning for carpal tunnel syndrome.  Provided gabapentin  100mg  to start at bedtime.  Referral to Dr. Francisco.   Prediabetes Assessment & Plan: Insurance is not covering GLP agonist at this time.  Start metformin  500 mg and titrate.      Return precautions given.   Risks, benefits, and alternatives of the medications and treatment plan prescribed today were discussed, and patient expressed understanding.   Education regarding symptom management and diagnosis given to patient on AVS either electronically or printed.  Return in about 1 week (around 02/22/2024).  Morgan Northern, FNP  Subjective:    Patient ID: Morgan Hurst, female    DOB: 07-Aug-1945, 79 y.o.   MRN: [id number]  CC: Morgan Hurst is a 79 y.o. female who presents today for follow up.   HPI: Complains bilateral leg x 2 weeks , slightly improved She has been working in the house No injury, falls.  Remote h/o right ankle fracture  Denies associated orthopnea, SOB, cp  She has gained '20 lbs' since coming off the ozempic ; she has noticed weight gain.   She has used baby oil    She is taking  metalazone 5mg  or torsemide  20mg  as needed without significant improvement.   Denies orthopnea, chest pain, shortness of breath.  Compliant metformin  500 mg daily.  She is no longer on semaglutide  due to insurance.  History of prediabetes  Consult with sports medicine Dr. Claudene 01/09/2024 spinal stenosis.  Started on Cymbalta . Advised to continue diclofenac  twice daily   FBG 105-120  Tolerating metformin  500mg  every day  Left leg new pink lesion.  Non tender. It is not itching.  Episodic right entire hands numbness. Radiating up to right elbow.  Denies fever, rash.  She doesn't a lot of sewing. Occassionally in left hand Denies neck pain  Xr right hand and wrist 1. 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 2. No acute displaced fracture.  Right upper extremity US  negative RUE 12/22/2023   Allergies: Patient has no known allergies. Medications Ordered Prior to Encounter[1]  Review of Systems  Constitutional:  Negative for chills and fever.  Respiratory:  Negative for cough and shortness of breath.   Cardiovascular:  Positive for leg swelling. Negative for chest pain and palpitations.  Gastrointestinal:  Negative for nausea and vomiting.  Skin:  Positive for wound (left thigh skin lesion).  Neurological:  Positive for numbness.      Objective:    BP 124/68   Pulse 85   Temp 98.3 F (36.8 C) (Oral)   Ht 5' 1 (1.549  m)   Wt 201 lb 9.6 oz (91.4 kg)   SpO2 94%   BMI 38.09 kg/m  BP Readings from Last 3 Encounters:  02/23/24 136/76  02/15/24 124/68  01/09/24 130/70   Wt Readings from Last 3 Encounters:  02/23/24 198 lb (89.8 kg)  02/15/24 201 lb 9.6 oz (91.4 kg)  01/09/24 190 lb (86.2 kg)      Physical Exam Vitals reviewed.  Constitutional:      Appearance: She is well-developed.  Eyes:     Conjunctiva/sclera: Conjunctivae normal.  Cardiovascular:     Rate and Rhythm: Normal rate and regular rhythm.     Pulses: Normal pulses.     Heart  sounds: Normal heart sounds.     Comments: +1 bilateral non pitting edema No palpable cords or masses. No erythema or increased warmth. No asymmetry in calf size when compared bilaterally LE hair growth symmetric and present. No discoloration or varicosities noted. LE warm and palpable pedal pulses.  Pulmonary:     Effort: Pulmonary effort is normal.     Breath sounds: Normal breath sounds. No wheezing, rhonchi or rales.  Musculoskeletal:     Right hand: No swelling or bony tenderness. Normal strength. There is no disruption of two-point discrimination. Normal capillary refill. Normal pulse.     Right lower leg: Edema present.     Left lower leg: Edema present.  Skin:    General: Skin is warm and dry.         Comments: 1 cm pink papule left upper thigh.  Neurological:     Mental Status: She is alert.  Psychiatric:        Speech: Speech normal.        Behavior: Behavior normal.        Thought Content: Thought content normal.            [1]  Current Outpatient Medications on File Prior to Visit  Medication Sig Dispense Refill   acetaminophen  (TYLENOL ) 650 MG CR tablet Take 650 mg by mouth every 8 (eight) hours as needed for pain.     ascorbic acid (VITAMIN C) 500 MG tablet Take 500 mg by mouth daily.     Blood Glucose Monitoring Suppl DEVI 1 each by Does not apply route as directed. Dispense based on patient and insurance preference. Use up to four times daily as directed. (FOR ICD-10 E10.9, E11.9). 1 each 0   cholecalciferol  (VITAMIN D3) 25 MCG (1000 UNIT) tablet Take 2,000 Units by mouth daily.     cyanocobalamin  (DODEX ) 1000 MCG/ML injection INJECT 1ML ONCE PER MONTH 10 mL 2   esomeprazole (NEXIUM) 40 MG capsule Take 40 mg by mouth daily as needed.     Glucose Blood (BLOOD GLUCOSE TEST STRIPS) STRP 1 each by Does not apply route as directed. Dispense based on patient and insurance preference. Use up to four times daily as directed. (FOR ICD-10 E10.9, E11.9). 100 strip 0    Lancet Device MISC 1 each by Does not apply route as directed. Dispense based on patient and insurance preference. Use up to four times daily as directed. (FOR ICD-10 E10.9, E11.9). 1 each 0   Lancets MISC 1 each by Does not apply route as directed. Dispense based on patient and insurance preference. Use up to four times daily as directed. (FOR ICD-10 E10.9, E11.9). 100 each 0   magnesium 30 MG tablet Take 30 mg by mouth daily.     metolazone  (ZAROXOLYN ) 5 MG tablet TAKE 1 TABLET  BY MOUTH DAILY AS NEEDED 90 tablet 1   ondansetron  (ZOFRAN -ODT) 4 MG disintegrating tablet Take 1 tablet (4 mg total) by mouth every 8 (eight) hours as needed. 20 tablet 0   potassium chloride  (KLOR-CON  M) 10 MEQ tablet Take 1 tablet by mouth twice daily. **Take with Metolazone  or Torsemide .** 180 tablet 1   REPATHA  SURECLICK 140 MG/ML SOAJ INJECT 140MG  INTO SKIN EVERY 14 DAYS 2 mL 2   rOPINIRole  (REQUIP ) 0.25 MG tablet TAKE 1 TABLET(0.25 MG) BY MOUTH AT BEDTIME AS NEEDED 90 tablet 3   torsemide  (DEMADEX ) 20 MG tablet Take 1 tablet (20 mg total) by mouth daily as needed. 90 tablet 0   traMADol  (ULTRAM ) 50 MG tablet TAKE 1 TABLET(50 MG) BY MOUTH DAILY AS NEEDED 30 tablet 2   Vitamin E 400 units TABS Take 400 Units by mouth daily.     hydrocortisone  2.5 % cream APPLY TWICE DAILY FOR ONE WEEK TO THE AFFECTED AREA AND THEN ONCE DAIL FOR 1 WEEK, THEN EVERY OTHER DAY FOR 1 WEEK 30 g 2   Current Facility-Administered Medications on File Prior to Visit  Medication Dose Route Frequency Provider Last Rate Last Admin   ipratropium-albuterol  (DUONEB) 0.5-2.5 (3) MG/3ML nebulizer solution 3 mL  3 mL Nebulization Once Evone Arseneau G, FNP       "

## 2024-02-16 ENCOUNTER — Ambulatory Visit: Payer: Self-pay | Admitting: Family

## 2024-02-16 LAB — B12 AND FOLATE PANEL
Folate: >23.4 ng/mL
Vitamin B-12: 372 pg/mL (ref 211–911)

## 2024-02-16 LAB — COMPREHENSIVE METABOLIC PANEL WITH GFR
ALT: 15 U/L (ref 3–35)
AST: 19 U/L (ref 5–37)
Albumin: 4.1 g/dL (ref 3.5–5.2)
Alkaline Phosphatase: 72 U/L (ref 39–117)
BUN: 13 mg/dL (ref 6–23)
CO2: 27 meq/L (ref 19–32)
Calcium: 9.7 mg/dL (ref 8.4–10.5)
Chloride: 104 meq/L (ref 96–112)
Creatinine, Ser: 0.76 mg/dL (ref 0.40–1.20)
GFR: 75.43 mL/min
Glucose, Bld: 97 mg/dL (ref 70–99)
Potassium: 4 meq/L (ref 3.5–5.1)
Sodium: 141 meq/L (ref 135–145)
Total Bilirubin: 0.4 mg/dL (ref 0.2–1.2)
Total Protein: 7.1 g/dL (ref 6.0–8.3)

## 2024-02-16 LAB — BRAIN NATRIURETIC PEPTIDE: Pro B Natriuretic peptide (BNP): 39 pg/mL (ref 1.0–100.0)

## 2024-02-23 ENCOUNTER — Ambulatory Visit: Admitting: Family

## 2024-02-23 ENCOUNTER — Ambulatory Visit: Admission: RE | Admit: 2024-02-23

## 2024-02-23 ENCOUNTER — Encounter: Payer: Self-pay | Admitting: Family

## 2024-02-23 ENCOUNTER — Telehealth: Payer: Self-pay | Admitting: Family

## 2024-02-23 VITALS — BP 136/76 | HR 78 | Temp 97.7°F | Ht 61.0 in | Wt 198.0 lb

## 2024-02-23 DIAGNOSIS — M7989 Other specified soft tissue disorders: Secondary | ICD-10-CM

## 2024-02-23 DIAGNOSIS — M810 Age-related osteoporosis without current pathological fracture: Secondary | ICD-10-CM

## 2024-02-23 DIAGNOSIS — M79641 Pain in right hand: Secondary | ICD-10-CM

## 2024-02-23 MED ORDER — WEGOVY 1.5 MG PO TABS: 1.5000 mg | ORAL_TABLET | Freq: Every day | ORAL | 0 refills | Status: AC

## 2024-02-23 NOTE — Patient Instructions (Addendum)
 I previously ordered  Referral is in for hand specialist , Dr Francisco  Information below:  [facility] [address]  Monday-Friday 8am-9pm Saturday and Sunday 9am- 9pm   [phone]  Stop metformin   Trial of wegovy  PILL 1.5mg  for 30 days.   Please reference to wegovy .com manufacture website in regards to cost.   Coupon is online or they can text SAVE to [phone].    https://www.wegovy .com/obesity/what-to-pay-for-wegovy .html?tc=ps_[email or web address]&cc=2003&utm_medium=cpc&utm_source=miso&utm_campaign=wegovy_oral_launch&utm_content=product_information_[email or web address]&showisi=true&gclid=[email or web address]&gclsrc=3p.ds&&utm_source=bing&utm_medium=cpc&utm_term=wegovy %20savings&utm_campaign=&utm_content=-dc_pcrid_[id number]_pkw_wegovy%20savings_pmt_bp_slid__product_&pgrid=[id number]&ptaid=[id number]:loc-190&msclkid=[id number]   It is important to take this medication on an empty stomach and with 4 ounces of water.  Wait at least 30 minutes before eating, drinking or taking other oral medications.   We have discussed starting non insulin  daily injectable medication called Wegovy   which is a glucagon like peptide (GLP 1) agonist and works by delaying gastric emptying and increasing insulin  secretion.It is given once per week. Most patients see significant weight loss with this drug class.   You may NOT take either medication if you or your family has history of thyroid , parathyroid, OR adrenal cancer. Please confirm you and your family does NOT have this history as this drug class has black box warning on this medication for that reason.   Please follow  directions on prescription and slowly increase the medication for your safety over time.   We can slowly titrate further at follow up with goal of no more than 1-2 lbs weight loss per week.  Semaglutide  (Wegovy )  Dose (mg) Once Weekly Titration:    If a dose is not tolerated, consider  delaying further dose increases for  another 4 weeks.  If you are actively losing weight on a dose, do not increase medication.   Days 1-30  1.5mg  daily  Days 31-60  4 mg daily  Days 61-90 9mg  daily  Maintenance Dose 25mg  daily

## 2024-02-23 NOTE — Telephone Encounter (Signed)
 What is status echocardiogram?

## 2024-02-23 NOTE — Assessment & Plan Note (Signed)
 Presentation consistent with venous insufficiency. Pending bilateral venous ultrasound, echocardiogram, BNP. Counseled on use of compression stockings and Ace wrap given today.  Counseled on importance of elevation. Close follow up.

## 2024-02-23 NOTE — Assessment & Plan Note (Signed)
 History of prediabetes patient has done well on Ozempic  in the past.  Counseled on side effects, how to take medication, and titration.  Start Wegovy  1.5 mg orally. Stop metformin .

## 2024-02-23 NOTE — Assessment & Plan Note (Signed)
 Insurance is not covering GLP agonist at this time.  Start metformin  500 mg and titrate.

## 2024-02-23 NOTE — Assessment & Plan Note (Signed)
 Chronic, stable.  Discussed CTS.  Patient will call to schedule appointment with [facility], Dr. Francisco.  Continue gabapentin  100 mg nightly

## 2024-02-23 NOTE — Progress Notes (Signed)
 "  Assessment & Plan:  Age related osteoporosis, unspecified pathological fracture presence Assessment & Plan: Reviewed DEXA She declines bisphosphonate therapy at this time.  Discussed continued monitoring and to repeat bone density in 2 years time.     Right hand pain Assessment & Plan: Chronic, stable.  Discussed CTS.  Patient will call to schedule appointment with [facility], Dr. Francisco.  Continue gabapentin  100 mg nightly   Morbid obesity (HCC) Assessment & Plan: History of prediabetes patient has done well on Ozempic  in the past.  Counseled on side effects, how to take medication, and titration.  Start Wegovy  1.5 mg orally. Stop metformin .   Leg swelling Assessment & Plan: Leg swelling has improved.  Continue conservative measures at home including Ace wrap, elevation.  Discussed zip up compression stockings. Still pending echocardiogram   Other orders -     Wegovy ; Take 1 tablet (1.5 mg total) by mouth daily. Daily in AM on an empty stomach with 4 oz of water. Do not eat or drink for 30 minutes after dose.  Dispense: 30 tablet; Refill: 0     Return precautions given.   Risks, benefits, and alternatives of the medications and treatment plan prescribed today were discussed, and patient expressed understanding.   Education regarding symptom management and diagnosis given to patient on AVS either electronically or printed.  Return in about 1 month (around 03/22/2024).  Morgan Northern, FNP  Subjective:    Patient ID: Morgan Hurst, female    DOB: 18-Apr-1945, 79 y.o.   MRN: [id number]  CC: Morgan Hurst is a 79 y.o. female who presents today for follow up.   HPI: Overall feels well today. No new complaints.  She notes she has a dermatology appointment scheduled for next week   BL leg swelling improved.   ACE wrap, elevation with improvement.   US  BL venous negative DVT 02/23/23      Compliant with gabapentin  100 mg at bedtime and right hand numbness and  pain has improved.  Pending referral to [facility]  DEXA with osteoporosis  She is compliant with calcium  and vit D  She has been on ozempic  in the past and tolerated medication.  She is interested in resuming Ozempic  or oral medication.   Starting GLP agonist / GIP:  Denies family history medullary thyroid  cancer, multiple endocrine neoplasia. Denies personal history of pancreatitis, multiple endocrine neoplasia, chronic constipation, history of ileus.   Denies feeling of mass in the neck, dysphagia, dyspnea, persistent hoarseness   Eye exam is up-to-date.  No known history of retinopathy.   Allergies: Patient has no known allergies. Medications Ordered Prior to Encounter[1]  Review of Systems  Constitutional:  Negative for chills and fever.  Respiratory:  Negative for cough.   Cardiovascular:  Negative for chest pain and palpitations.  Gastrointestinal:  Negative for nausea and vomiting.  Neurological:  Positive for numbness (right hand).      Objective:    BP 136/76   Pulse 78   Temp 97.7 F (36.5 C) (Oral)   Ht 5' 1 (1.549 m)   Wt 198 lb (89.8 kg)   SpO2 97%   BMI 37.41 kg/m  BP Readings from Last 3 Encounters:  02/23/24 136/76  02/15/24 124/68  01/09/24 130/70   Wt Readings from Last 3 Encounters:  02/23/24 198 lb (89.8 kg)  02/15/24 201 lb 9.6 oz (91.4 kg)  01/09/24 190 lb (86.2 kg)    Physical Exam Vitals reviewed.  Constitutional:  Appearance: She is well-developed.  Eyes:     Conjunctiva/sclera: Conjunctivae normal.  Cardiovascular:     Rate and Rhythm: Normal rate and regular rhythm.     Pulses: Normal pulses.     Heart sounds: Normal heart sounds.     Comments: Trace nonpitting BLE.   Pulmonary:     Effort: Pulmonary effort is normal.     Breath sounds: Normal breath sounds. No wheezing, rhonchi or rales.  Musculoskeletal:     Right lower leg: Edema present.     Left lower leg: Edema present.  Skin:    General: Skin is warm and  dry.  Neurological:     Mental Status: She is alert.  Psychiatric:        Speech: Speech normal.        Behavior: Behavior normal.        Thought Content: Thought content normal.            [1]  Current Outpatient Medications on File Prior to Visit  Medication Sig Dispense Refill   acetaminophen  (TYLENOL ) 650 MG CR tablet Take 650 mg by mouth every 8 (eight) hours as needed for pain.     ascorbic acid (VITAMIN C) 500 MG tablet Take 500 mg by mouth daily.     Blood Glucose Monitoring Suppl DEVI 1 each by Does not apply route as directed. Dispense based on patient and insurance preference. Use up to four times daily as directed. (FOR ICD-10 E10.9, E11.9). 1 each 0   cholecalciferol  (VITAMIN D3) 25 MCG (1000 UNIT) tablet Take 2,000 Units by mouth daily.     cyanocobalamin  (DODEX ) 1000 MCG/ML injection INJECT 1ML ONCE PER MONTH 10 mL 2   esomeprazole (NEXIUM) 40 MG capsule Take 40 mg by mouth daily as needed.     gabapentin  (NEURONTIN ) 100 MG capsule Take 1 capsule (100 mg total) by mouth at bedtime. 30 capsule 3   Glucose Blood (BLOOD GLUCOSE TEST STRIPS) STRP 1 each by Does not apply route as directed. Dispense based on patient and insurance preference. Use up to four times daily as directed. (FOR ICD-10 E10.9, E11.9). 100 strip 0   hydrocortisone  2.5 % cream APPLY TWICE DAILY FOR ONE WEEK TO THE AFFECTED AREA AND THEN ONCE DAIL FOR 1 WEEK, THEN EVERY OTHER DAY FOR 1 WEEK 30 g 2   Lancet Device MISC 1 each by Does not apply route as directed. Dispense based on patient and insurance preference. Use up to four times daily as directed. (FOR ICD-10 E10.9, E11.9). 1 each 0   Lancets MISC 1 each by Does not apply route as directed. Dispense based on patient and insurance preference. Use up to four times daily as directed. (FOR ICD-10 E10.9, E11.9). 100 each 0   magnesium 30 MG tablet Take 30 mg by mouth daily.     metolazone  (ZAROXOLYN ) 5 MG tablet TAKE 1 TABLET BY MOUTH DAILY AS NEEDED 90  tablet 1   ondansetron  (ZOFRAN -ODT) 4 MG disintegrating tablet Take 1 tablet (4 mg total) by mouth every 8 (eight) hours as needed. 20 tablet 0   potassium chloride  (KLOR-CON  M) 10 MEQ tablet Take 1 tablet by mouth twice daily. **Take with Metolazone  or Torsemide .** 180 tablet 1   REPATHA  SURECLICK 140 MG/ML SOAJ INJECT 140MG  INTO SKIN EVERY 14 DAYS 2 mL 2   rOPINIRole  (REQUIP ) 0.25 MG tablet TAKE 1 TABLET(0.25 MG) BY MOUTH AT BEDTIME AS NEEDED 90 tablet 3   torsemide  (DEMADEX ) 20 MG tablet Take 1  tablet (20 mg total) by mouth daily as needed. 90 tablet 0   traMADol  (ULTRAM ) 50 MG tablet TAKE 1 TABLET(50 MG) BY MOUTH DAILY AS NEEDED 30 tablet 2   Vitamin E 400 units TABS Take 400 Units by mouth daily.     Current Facility-Administered Medications on File Prior to Visit  Medication Dose Route Frequency Provider Last Rate Last Admin   ipratropium-albuterol  (DUONEB) 0.5-2.5 (3) MG/3ML nebulizer solution 3 mL  3 mL Nebulization Once Katja Blue G, FNP       "

## 2024-02-23 NOTE — Assessment & Plan Note (Addendum)
 Reviewed DEXA She declines bisphosphonate therapy at this time.  Discussed continued monitoring and to repeat bone density in 2 years time.

## 2024-02-23 NOTE — Assessment & Plan Note (Signed)
 Leg swelling has improved.  Continue conservative measures at home including Ace wrap, elevation.  Discussed zip up compression stockings. Still pending echocardiogram

## 2024-02-23 NOTE — Assessment & Plan Note (Signed)
 Presentation concerning for carpal tunnel syndrome.  Provided gabapentin  100mg  to start at bedtime.  Referral to Dr. Francisco.

## 2024-02-28 ENCOUNTER — Ambulatory Visit

## 2024-03-11 ENCOUNTER — Ambulatory Visit: Admitting: Family Medicine

## 2024-03-18 ENCOUNTER — Ambulatory Visit: Admitting: Podiatry

## 2024-03-25 ENCOUNTER — Ambulatory Visit: Admitting: Family

## 2024-12-04 ENCOUNTER — Ambulatory Visit
